# Patient Record
Sex: Male | Born: 1950 | Race: White | Hispanic: No | Marital: Married | Smoking: Former smoker
Health system: Southern US, Community
[De-identification: ages and names within clinical notes are randomized; demographics above are authoritative.]

## PROBLEM LIST (undated history)

## (undated) DIAGNOSIS — T7840XA Allergy, unspecified, initial encounter: Secondary | ICD-10-CM

## (undated) DIAGNOSIS — R51 Headache: Secondary | ICD-10-CM

## (undated) DIAGNOSIS — K219 Gastro-esophageal reflux disease without esophagitis: Secondary | ICD-10-CM

## (undated) DIAGNOSIS — M179 Osteoarthritis of knee, unspecified: Secondary | ICD-10-CM

## (undated) DIAGNOSIS — Z87442 Personal history of urinary calculi: Secondary | ICD-10-CM

## (undated) DIAGNOSIS — I10 Essential (primary) hypertension: Secondary | ICD-10-CM

## (undated) DIAGNOSIS — I2510 Atherosclerotic heart disease of native coronary artery without angina pectoris: Secondary | ICD-10-CM

## (undated) HISTORY — PX: CARDIAC CATHETERIZATION: SHX172

## (undated) HISTORY — DX: Essential (primary) hypertension: I10

## (undated) HISTORY — DX: Headache: R51

## (undated) HISTORY — DX: Gastro-esophageal reflux disease without esophagitis: K21.9

## (undated) HISTORY — PX: HERNIA REPAIR: SHX51

## (undated) HISTORY — DX: Allergy, unspecified, initial encounter: T78.40XA

---

## 1989-02-26 HISTORY — PX: KNEE SURGERY: SHX244

## 1992-10-24 HISTORY — PX: NASAL SINUS SURGERY: SHX719

## 1993-04-25 HISTORY — PX: ROTATOR CUFF REPAIR: SHX139

## 1995-05-20 HISTORY — PX: OTHER SURGICAL HISTORY: SHX169

## 1998-07-07 ENCOUNTER — Ambulatory Visit (HOSPITAL_COMMUNITY): Admission: RE | Admit: 1998-07-07 | Discharge: 1998-07-07 | Payer: Self-pay | Admitting: Urology

## 1998-07-07 ENCOUNTER — Encounter: Payer: Self-pay | Admitting: Urology

## 1999-09-22 ENCOUNTER — Encounter: Payer: Self-pay | Admitting: Emergency Medicine

## 1999-09-22 ENCOUNTER — Emergency Department (HOSPITAL_COMMUNITY): Admission: EM | Admit: 1999-09-22 | Discharge: 1999-09-22 | Payer: Self-pay | Admitting: Emergency Medicine

## 2001-02-05 ENCOUNTER — Encounter: Admission: RE | Admit: 2001-02-05 | Discharge: 2001-02-05 | Payer: Self-pay | Admitting: Family Medicine

## 2001-02-05 ENCOUNTER — Encounter: Payer: Self-pay | Admitting: Family Medicine

## 2004-10-25 ENCOUNTER — Ambulatory Visit: Payer: Self-pay | Admitting: Family Medicine

## 2004-11-06 ENCOUNTER — Ambulatory Visit: Payer: Self-pay | Admitting: Family Medicine

## 2005-12-21 ENCOUNTER — Ambulatory Visit: Payer: Self-pay | Admitting: Family Medicine

## 2005-12-31 ENCOUNTER — Ambulatory Visit: Payer: Self-pay | Admitting: Family Medicine

## 2006-02-05 ENCOUNTER — Ambulatory Visit: Payer: Self-pay | Admitting: Family Medicine

## 2007-01-06 ENCOUNTER — Ambulatory Visit: Payer: Self-pay | Admitting: Family Medicine

## 2007-01-06 LAB — CONVERTED CEMR LAB
ALT: 17 units/L (ref 0–40)
AST: 19 units/L (ref 0–37)
Albumin: 3.5 g/dL (ref 3.5–5.2)
Alkaline Phosphatase: 35 units/L — ABNORMAL LOW (ref 39–117)
BUN: 16 mg/dL (ref 6–23)
Basophils Absolute: 0 10*3/uL (ref 0.0–0.1)
Basophils Relative: 0.4 % (ref 0.0–1.0)
Bilirubin, Direct: 0.1 mg/dL (ref 0.0–0.3)
CO2: 33 meq/L — ABNORMAL HIGH (ref 19–32)
Calcium: 9 mg/dL (ref 8.4–10.5)
Chloride: 105 meq/L (ref 96–112)
Cholesterol: 174 mg/dL (ref 0–200)
Creatinine, Ser: 0.9 mg/dL (ref 0.4–1.5)
Eosinophils Absolute: 0.2 10*3/uL (ref 0.0–0.6)
Eosinophils Relative: 3.4 % (ref 0.0–5.0)
GFR calc Af Amer: 113 mL/min
GFR calc non Af Amer: 93 mL/min
Glucose, Bld: 97 mg/dL (ref 70–99)
HCT: 43 % (ref 39.0–52.0)
HDL: 28.7 mg/dL — ABNORMAL LOW (ref >39.0)
Hemoglobin: 14.6 g/dL (ref 13.0–17.0)
LDL Cholesterol: 111 mg/dL — ABNORMAL HIGH (ref 0–99)
Lymphocytes Relative: 27.5 % (ref 12.0–46.0)
MCHC: 33.9 g/dL (ref 30.0–36.0)
MCV: 92.3 fL (ref 78.0–100.0)
Monocytes Absolute: 0.5 10*3/uL (ref 0.2–0.7)
Monocytes Relative: 8.8 % (ref 3.0–11.0)
Neutro Abs: 3.4 10*3/uL (ref 1.4–7.7)
Neutrophils Relative %: 59.9 % (ref 43.0–77.0)
PSA: 1.69 ng/mL (ref 0.10–4.00)
Platelets: 222 10*3/uL (ref 150–400)
Potassium: 3.8 meq/L (ref 3.5–5.1)
RBC: 4.66 M/uL (ref 4.22–5.81)
RDW: 12 % (ref 11.5–14.6)
Sodium: 144 meq/L (ref 135–145)
TSH: 1.3 microintl units/mL (ref 0.35–5.50)
Total Bilirubin: 0.5 mg/dL (ref 0.3–1.2)
Total CHOL/HDL Ratio: 6.1
Total Protein: 6.5 g/dL (ref 6.0–8.3)
Triglycerides: 170 mg/dL — ABNORMAL HIGH (ref 0–149)
VLDL: 34 mg/dL (ref 0–40)
WBC: 5.6 10*3/uL (ref 4.5–10.5)

## 2007-01-07 LAB — CONVERTED CEMR LAB
ALT: 17 units/L (ref 0–40)
AST: 19 units/L (ref 0–37)
Albumin: 3.5 g/dL (ref 3.5–5.2)
Alkaline Phosphatase: 35 units/L — ABNORMAL LOW (ref 39–117)
BUN: 16 mg/dL (ref 6–23)
Basophils Absolute: 0 10*3/uL (ref 0.0–0.1)
Basophils Relative: 0.4 % (ref 0.0–1.0)
Bilirubin, Direct: 0.1 mg/dL (ref 0.0–0.3)
CO2: 33 meq/L — ABNORMAL HIGH (ref 19–32)
Calcium: 9 mg/dL (ref 8.4–10.5)
Chloride: 105 meq/L (ref 96–112)
Cholesterol: 174 mg/dL (ref 0–200)
Creatinine, Ser: 0.9 mg/dL (ref 0.4–1.5)
Eosinophils Absolute: 0.2 10*3/uL (ref 0.0–0.6)
Eosinophils Relative: 3.4 % (ref 0.0–5.0)
GFR calc Af Amer: 113 mL/min
GFR calc non Af Amer: 93 mL/min
Glucose, Bld: 97 mg/dL (ref 70–99)
HCT: 43 % (ref 39.0–52.0)
HDL: 28.7 mg/dL — ABNORMAL LOW (ref >39.0)
Hemoglobin: 14.6 g/dL (ref 13.0–17.0)
LDL Cholesterol: 111 mg/dL — ABNORMAL HIGH (ref 0–99)
Lymphocytes Relative: 27.5 % (ref 12.0–46.0)
MCHC: 33.9 g/dL (ref 30.0–36.0)
MCV: 92.3 fL (ref 78.0–100.0)
Monocytes Absolute: 0.5 10*3/uL (ref 0.2–0.7)
Monocytes Relative: 8.8 % (ref 3.0–11.0)
Neutro Abs: 3.4 10*3/uL (ref 1.4–7.7)
Neutrophils Relative %: 59.9 % (ref 43.0–77.0)
PSA: 1.69 ng/mL (ref 0.10–4.00)
Platelets: 222 10*3/uL (ref 150–400)
Potassium: 3.8 meq/L (ref 3.5–5.1)
RBC: 4.66 M/uL (ref 4.22–5.81)
RDW: 12 % (ref 11.5–14.6)
Sodium: 144 meq/L (ref 135–145)
TSH: 1.3 microintl units/mL (ref 0.35–5.50)
Total Bilirubin: 0.5 mg/dL (ref 0.3–1.2)
Total CHOL/HDL Ratio: 6.1
Total Protein: 6.5 g/dL (ref 6.0–8.3)
Triglycerides: 170 mg/dL — ABNORMAL HIGH (ref 0–149)
VLDL: 34 mg/dL (ref 0–40)
WBC: 5.6 10*3/uL (ref 4.5–10.5)

## 2007-01-13 ENCOUNTER — Ambulatory Visit: Payer: Self-pay | Admitting: Family Medicine

## 2008-01-06 ENCOUNTER — Ambulatory Visit: Payer: Self-pay | Admitting: Family Medicine

## 2008-01-06 LAB — CONVERTED CEMR LAB
ALT: 15 units/L (ref 0–53)
AST: 18 units/L (ref 0–37)
Albumin: 3.7 g/dL (ref 3.5–5.2)
Alkaline Phosphatase: 35 units/L — ABNORMAL LOW (ref 39–117)
BUN: 18 mg/dL (ref 6–23)
Basophils Absolute: 0 10*3/uL (ref 0.0–0.1)
Basophils Relative: 0.6 % (ref 0.0–1.0)
Bilirubin Urine: NEGATIVE
Bilirubin, Direct: 0.1 mg/dL (ref 0.0–0.3)
Blood in Urine, dipstick: NEGATIVE
CO2: 32 meq/L (ref 19–32)
Calcium: 9.4 mg/dL (ref 8.4–10.5)
Chloride: 107 meq/L (ref 96–112)
Cholesterol: 186 mg/dL (ref 0–200)
Creatinine, Ser: 1 mg/dL (ref 0.4–1.5)
Eosinophils Absolute: 0.2 10*3/uL (ref 0.0–0.6)
Eosinophils Relative: 3.1 % (ref 0.0–5.0)
GFR calc Af Amer: 99 mL/min
GFR calc non Af Amer: 82 mL/min
Glucose, Bld: 108 mg/dL — ABNORMAL HIGH (ref 70–99)
Glucose, Urine, Semiquant: NEGATIVE
HCT: 41.7 % (ref 39.0–52.0)
HDL: 24.9 mg/dL — ABNORMAL LOW (ref >39.0)
Hemoglobin: 14.3 g/dL (ref 13.0–17.0)
Ketones, urine, test strip: NEGATIVE
LDL Cholesterol: 140 mg/dL — ABNORMAL HIGH (ref 0–99)
Lymphocytes Relative: 25.8 % (ref 12.0–46.0)
MCHC: 34.2 g/dL (ref 30.0–36.0)
MCV: 90.8 fL (ref 78.0–100.0)
Monocytes Absolute: 0.5 10*3/uL (ref 0.2–0.7)
Monocytes Relative: 8.9 % (ref 3.0–11.0)
Neutro Abs: 3.8 10*3/uL (ref 1.4–7.7)
Neutrophils Relative %: 61.6 % (ref 43.0–77.0)
Nitrite: NEGATIVE
PSA: 0.84 ng/mL (ref 0.10–4.00)
Platelets: 219 10*3/uL (ref 150–400)
Potassium: 4.4 meq/L (ref 3.5–5.1)
Protein, U semiquant: NEGATIVE
RBC: 4.59 M/uL (ref 4.22–5.81)
RDW: 11.5 % (ref 11.5–14.6)
Sodium: 143 meq/L (ref 135–145)
Specific Gravity, Urine: >=1.03
TSH: 1.16 microintl units/mL (ref 0.35–5.50)
Total Bilirubin: 0.9 mg/dL (ref 0.3–1.2)
Total CHOL/HDL Ratio: 7.5
Total Protein: 6.3 g/dL (ref 6.0–8.3)
Triglycerides: 104 mg/dL (ref 0–149)
Urobilinogen, UA: 0.2
VLDL: 21 mg/dL (ref 0–40)
WBC Urine, dipstick: NEGATIVE
WBC: 6 10*3/uL (ref 4.5–10.5)
pH: 6

## 2008-01-30 ENCOUNTER — Ambulatory Visit: Payer: Self-pay | Admitting: Family Medicine

## 2008-01-30 DIAGNOSIS — J45909 Unspecified asthma, uncomplicated: Secondary | ICD-10-CM | POA: Insufficient documentation

## 2008-01-30 DIAGNOSIS — K219 Gastro-esophageal reflux disease without esophagitis: Secondary | ICD-10-CM

## 2008-01-30 DIAGNOSIS — R519 Headache, unspecified: Secondary | ICD-10-CM

## 2008-01-30 DIAGNOSIS — I10 Essential (primary) hypertension: Secondary | ICD-10-CM

## 2008-01-30 DIAGNOSIS — R51 Headache: Secondary | ICD-10-CM

## 2008-01-30 DIAGNOSIS — J309 Allergic rhinitis, unspecified: Secondary | ICD-10-CM

## 2008-01-30 HISTORY — DX: Gastro-esophageal reflux disease without esophagitis: K21.9

## 2008-01-30 HISTORY — DX: Headache, unspecified: R51.9

## 2008-08-02 ENCOUNTER — Ambulatory Visit: Payer: Self-pay | Admitting: Family Medicine

## 2008-08-02 DIAGNOSIS — K429 Umbilical hernia without obstruction or gangrene: Secondary | ICD-10-CM | POA: Insufficient documentation

## 2008-08-25 ENCOUNTER — Telehealth: Payer: Self-pay | Admitting: Family Medicine

## 2008-08-26 ENCOUNTER — Telehealth: Payer: Self-pay | Admitting: Family Medicine

## 2008-09-29 ENCOUNTER — Encounter: Admission: RE | Admit: 2008-09-29 | Discharge: 2008-09-29 | Payer: Self-pay | Admitting: Surgery

## 2009-03-01 ENCOUNTER — Ambulatory Visit: Payer: Self-pay | Admitting: Family Medicine

## 2009-03-01 LAB — CONVERTED CEMR LAB
Albumin: 3.5 g/dL (ref 3.5–5.2)
Alkaline Phosphatase: 43 units/L (ref 39–117)
Basophils Relative: 0.9 % (ref 0.0–3.0)
Bilirubin Urine: NEGATIVE
Blood in Urine, dipstick: NEGATIVE
CO2: 32 meq/L (ref 19–32)
Chloride: 106 meq/L (ref 96–112)
Direct LDL: 109.3 mg/dL
Eosinophils Absolute: 0.3 10*3/uL (ref 0.0–0.7)
Glucose, Urine, Semiquant: NEGATIVE
HCT: 40.3 % (ref 39.0–52.0)
Hemoglobin: 14.2 g/dL (ref 13.0–17.0)
Lymphocytes Relative: 26.8 % (ref 12.0–46.0)
MCHC: 35.3 g/dL (ref 30.0–36.0)
MCV: 92.4 fL (ref 78.0–100.0)
Monocytes Absolute: 0.5 10*3/uL (ref 0.1–1.0)
Neutro Abs: 3.3 10*3/uL (ref 1.4–7.7)
Nitrite: NEGATIVE
Potassium: 4.1 meq/L (ref 3.5–5.1)
Protein, U semiquant: NEGATIVE
RBC: 4.36 M/uL (ref 4.22–5.81)
Sodium: 142 meq/L (ref 135–145)
Specific Gravity, Urine: 1.02
Total CHOL/HDL Ratio: 7
Total Protein: 6.7 g/dL (ref 6.0–8.3)
Urobilinogen, UA: 0.2
WBC Urine, dipstick: NEGATIVE
pH: 6

## 2009-03-07 ENCOUNTER — Ambulatory Visit: Payer: Self-pay | Admitting: Family Medicine

## 2010-08-22 ENCOUNTER — Ambulatory Visit: Payer: Self-pay | Admitting: Family Medicine

## 2010-08-22 LAB — CONVERTED CEMR LAB
AST: 17 units/L (ref 0–37)
Albumin: 3.4 g/dL — ABNORMAL LOW (ref 3.5–5.2)
Alkaline Phosphatase: 37 units/L — ABNORMAL LOW (ref 39–117)
Basophils Relative: 0.6 % (ref 0.0–3.0)
Bilirubin, Direct: 0 mg/dL (ref 0.0–0.3)
Blood in Urine, dipstick: NEGATIVE
CO2: 31 meq/L (ref 19–32)
Calcium: 9.2 mg/dL (ref 8.4–10.5)
GFR calc non Af Amer: 82.24 mL/min (ref >60)
HDL: 28.3 mg/dL — ABNORMAL LOW (ref >39.00)
Hemoglobin: 14.2 g/dL (ref 13.0–17.0)
LDL Cholesterol: 124 mg/dL — ABNORMAL HIGH (ref 0–99)
Lymphocytes Relative: 22.7 % (ref 12.0–46.0)
Monocytes Relative: 9.7 % (ref 3.0–12.0)
Neutro Abs: 3.7 10*3/uL (ref 1.4–7.7)
Nitrite: NEGATIVE
RBC: 4.46 M/uL (ref 4.22–5.81)
Sodium: 139 meq/L (ref 135–145)
Specific Gravity, Urine: 1.025
Total CHOL/HDL Ratio: 6
Total Protein: 5.9 g/dL — ABNORMAL LOW (ref 6.0–8.3)
VLDL: 26.8 mg/dL (ref 0.0–40.0)
WBC Urine, dipstick: NEGATIVE
WBC: 5.8 10*3/uL (ref 4.5–10.5)

## 2010-08-29 ENCOUNTER — Encounter: Payer: Self-pay | Admitting: Family Medicine

## 2010-08-29 ENCOUNTER — Ambulatory Visit: Payer: Self-pay | Admitting: Family Medicine

## 2010-11-03 ENCOUNTER — Encounter: Payer: Self-pay | Admitting: Family Medicine

## 2010-11-28 NOTE — Assessment & Plan Note (Signed)
Summary: cpx/cjr   Vital Signs:  Patient profile:   60 year old male Height:      61.75 inches Weight:      191 pounds BMI:     35.35 Temp:     98.3 degrees F oral BP sitting:   138 / 98  (left arm) Cuff size:   regular  Vitals Entered By: Kern Reap CMA Duncan Dull) (August 29, 2010 2:03 PM) CC: cpx Is Patient Diabetic? No   CC:  cpx.  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 who comes in today for evaluation of hypertension, reflux esophagitis.  He gets routine eye care, dental care, declines colonoscopy, tetanus, 2007, Bumex, 2008.  Flu shot today.  Review of systems negative except for pain in his left knee for 4 months.  No history of trauma swelling or locking.  BP at home today 130/80  He also complains of some exercise induced asthma.  He hunts  a lot, especially when the weather is cold.  He wheezes.  He's used albuterol in the past with good success.  He would like a refill  Allergies: No Known Drug Allergies  Past History:  Past medical, surgical, family and social histories (including risk factors) reviewed, and no changes noted (except as noted below).  Past Medical History: Reviewed history from 01/30/2008 and no changes required. Allergic rhinitis Asthma GERD Hypertension Headache  Family History: Reviewed history from 01/30/2008 and no changes required. father died 20, COPD, pneumonia, smoker mother died 61, sclera derma renal failure 4 brothers, one died of cardiomyopathy at age 48.  The other has hypertension and MI.  A smoker at age 40.  The other is hypertensive and the other also has high blood pressure.  Four sisters two of hypertension.  One has diabetes.  The other is osteoarthritis.  Social History: Reviewed history from 01/30/2008  and no changes required. Occupation: Married Never Smoked Alcohol use-no Drug use-no Regular exercise-yes  Review of Systems      See HPI       Flu Vaccine Consent Questions     Do you have a history of severe allergic reactions to this vaccine? no    Any prior history of allergic reactions to egg and/or gelatin? no    Do you have a sensitivity to the preservative Thimersol? no    Do you have a past history of Guillan-Barre Syndrome? no    Do you currently have an acute febrile illness? no    Have you ever had a severe reaction to latex? no    Vaccine information given and explained to patient? yes    Are you currently pregnant? no    Lot Number:[id number]   Exp Date:04/28/2011   Site Given  Right  Deltoid IM   Physical Exam  General:  Well-developed,well-nourished,in no acute distress; alert,appropriate and cooperative throughout examination Head:  Normocephalic and atraumatic without obvious abnormalities. No apparent alopecia or balding. Eyes:  No corneal or conjunctival inflammation noted. EOMI. Perrla. Funduscopic exam benign, without hemorrhages, exudates or papilledema. Vision grossly normal. Ears:  External ear exam shows no significant lesions or deformities.  Otoscopic examination reveals clear canals, tympanic membranes are intact bilaterally without bulging, retraction, inflammation or discharge. Hearing is grossly normal bilaterally. Nose:  External nasal examination shows no deformity or inflammation. Nasal mucosa  are pink and moist without lesions or exudates. Mouth:  Oral mucosa and oropharynx without lesions or exudates.  Teeth in good repair. Neck:  No deformities, masses, or tenderness noted. Chest Wall:  No deformities, masses, tenderness or gynecomastia noted. Breasts:  No masses or gynecomastia noted Lungs:  Normal respiratory effort, chest expands symmetrically. Lungs are clear to auscultation, no crackles or wheezes. Heart:  Normal rate and regular rhythm.  S1 and S2 normal without gallop, murmur, click, rub or other extra sounds. Abdomen:  Bowel sounds positive,abdomen soft and non-tender without masses, organomegaly or hernias noted. Rectal:  No external abnormalities noted. Normal sphincter tone. No rectal masses or tenderness. Genitalia:  Testes bilaterally descended without nodularity, tenderness or masses. No scrotal masses or lesions. No penis lesions or urethral discharge. Prostate:  Prostate gland firm and smooth, no enlargement, nodularity, tenderness, mass, asymmetry or induration. Msk:  No deformity or scoliosis noted of thoracic or lumbar spine.   Pulses:  R and L carotid,radial,femoral,dorsalis pedis and posterior tibial pulses are full and equal bilaterally Extremities:  No clubbing, cyanosis, edema, or deformity noted with normal full range of motion of all joints.   Neurologic:  No cranial nerve deficits noted. Station and gait are normal. Plantar reflexes are down-going bilaterally. DTRs are symmetrical throughout. Sensory, motor and coordinative functions appear intact. Skin:  Intact without suspicious lesions or rashes Cervical Nodes:  No lymphadenopathy noted Axillary Nodes:  No palpable lymphadenopathy Inguinal Nodes:  No significant adenopathy Psych:  Cognition and judgment appear intact. Alert and cooperative with normal attention span and concentration. No apparent delusions, illusions, hallucinations   Impression & Recommendations:  Problem # 1:  HYPERTENSION (ICD-401.9) Assessment Improved  His updated medication list for this problem includes:    Lisinopril-hydrochlorothiazide 20-25 Mg Tabs (Lisinopril-hydrochlorothiazide) ..... Qqd  Orders: EKG w/ Interpretation (93000)  Problem # 2:  Preventive Health Care (ICD-V70.0) Assessment: Unchanged  Problem # 3:  GERD (ICD-530.81) Assessment: Improved  His updated medication list for this problem includes:    Cvs Omeprazole 20 Mg Tbec (Omeprazole) ..... Once  daily  Complete Medication List: 1)  Lisinopril-hydrochlorothiazide 20-25 Mg Tabs (Lisinopril-hydrochlorothiazide) .... Qqd 2)  Adult Aspirin Ec Low Strength 81 Mg Tbec (Aspirin) .... Once daily 3)  Cvs Omeprazole 20 Mg Tbec (Omeprazole) .... Once daily 4)  Proventil Hfa 108 (90 Base) Mcg/act Aers (Albuterol sulfate) .[facility].. 1 puff < exercise  Other Orders: Gastroenterology Referral (GI) Admin 1st Vaccine (13244) Flu Vaccine 47yrs + [phone])  Patient Instructions: 1)  you can take Motrin 600 mg twice daily with food elevation and ice for soreness in her left knee. 2)  Please schedule a follow-up appointment in 1 year. 3)  Take an Aspirin every day. 4)  Schedule a colonoscopy/sigmoidoscopy to help detect colon cancer. Prescriptions: PROVENTIL HFA 108 (90 BASE) MCG/ACT AERS (ALBUTEROL SULFATE) 1 puff < exercise  #1 x 1   Entered and Authorized by:   Roderick Pee MD   Signed by:   Roderick Pee MD on 08/29/2010   Method used:   Printed then faxed to ...       [facility]       [address]       [place], [place]  [address]       Ph: [phone]       Fax: [phone]   RxID:   [id number] LISINOPRIL-HYDROCHLOROTHIAZIDE 20-25 MG  TABS (LISINOPRIL-HYDROCHLOROTHIAZIDE) qqd  #100 x  3   Entered and Authorized by:   Roderick Pee MD   Signed by:   Roderick Pee MD on 08/29/2010   Method used:   Electronically to        [facility]ret[facility])       [address]       [place], [place]  [address]       Ph: [phone]       Fax: [phone]   RxID:   [id number]    Orders Added: 1)  Gastroenterology Referral [GI] 2)  Est. Patient 40-64 years [99396] 3)  EKG w/ Interpretation [93000] 4)  Admin 1st Vaccine [90471] 5)  Flu Vaccine 36yrs + [84132]

## 2010-11-30 NOTE — Letter (Signed)
Summary: Referral - not able to see patient  [facility]  [address] [place], [place] [address]   Phone: [phone]  Fax: [phone]    November 03, 2010  Kelle Darting, MD [address] [place], [place] [address]   Re:   Frank [name] DOB:  June 21, 1951 MRN:   [id number]    Dear Dr. Tawanna Cooler:  Thank you for your kind referral of the above patient.  We have attempted to schedule the recommended procedure Screening Colonoscopy but have not been able to schedule because:  ___ The patient was not available by phone and/or has not returned our calls.  X   The patient declined to schedule the procedure at this time.  We appreciate the referral and hope that we will have the opportunity to treat this patient in the future.    Sincerely,    [facility] [phone]

## 2011-05-21 ENCOUNTER — Telehealth: Payer: Self-pay | Admitting: *Deleted

## 2011-05-21 NOTE — Telephone Encounter (Signed)
patient would like to be referred to orthopedist for hip pain.

## 2011-05-22 NOTE — Telephone Encounter (Signed)
Dr. Norlene Campbell,,,,,,,,, he can call and make his own appointment

## 2011-05-22 NOTE — Telephone Encounter (Signed)
patient  Is aware 

## 2011-08-21 ENCOUNTER — Ambulatory Visit (INDEPENDENT_AMBULATORY_CARE_PROVIDER_SITE_OTHER): Payer: BC Managed Care – PPO

## 2011-08-21 DIAGNOSIS — Z23 Encounter for immunization: Secondary | ICD-10-CM

## 2011-10-15 ENCOUNTER — Other Ambulatory Visit (INDEPENDENT_AMBULATORY_CARE_PROVIDER_SITE_OTHER): Payer: BC Managed Care – PPO

## 2011-10-15 DIAGNOSIS — Z0000 Encounter for general adult medical examination without abnormal findings: Secondary | ICD-10-CM

## 2011-10-15 LAB — BASIC METABOLIC PANEL
BUN: 21 mg/dL (ref 6–23)
CO2: 28 mEq/L (ref 19–32)
Calcium: 9.3 mg/dL (ref 8.4–10.5)
GFR: 80.05 mL/min (ref >60.00)
Glucose, Bld: 96 mg/dL (ref 70–99)
Potassium: 4.7 mEq/L (ref 3.5–5.1)

## 2011-10-15 LAB — CBC WITH DIFFERENTIAL/PLATELET
Basophils Absolute: 0 10*3/uL (ref 0.0–0.1)
Eosinophils Absolute: 0.2 10*3/uL (ref 0.0–0.7)
HCT: 43.7 % (ref 39.0–52.0)
Lymphs Abs: 1.6 10*3/uL (ref 0.7–4.0)
MCHC: 34 g/dL (ref 30.0–36.0)
Monocytes Relative: 8.8 % (ref 3.0–12.0)
Neutro Abs: 5.1 10*3/uL (ref 1.4–7.7)
Platelets: 219 10*3/uL (ref 150.0–400.0)
RDW: 13 % (ref 11.5–14.6)

## 2011-10-15 LAB — LIPID PANEL
Cholesterol: 188 mg/dL (ref 0–200)
HDL: 33.3 mg/dL — ABNORMAL LOW (ref >39.00)
LDL Cholesterol: 124 mg/dL — ABNORMAL HIGH (ref 0–99)
Triglycerides: 152 mg/dL — ABNORMAL HIGH (ref 0.0–149.0)
VLDL: 30.4 mg/dL (ref 0.0–40.0)

## 2011-10-15 LAB — POCT URINALYSIS DIPSTICK
Blood, UA: NEGATIVE
Ketones, UA: NEGATIVE
Protein, UA: NEGATIVE
Spec Grav, UA: 1.02
Urobilinogen, UA: 0.2
pH, UA: 6.5

## 2011-10-15 LAB — HEPATIC FUNCTION PANEL
AST: 17 U/L (ref 0–37)
Albumin: 3.7 g/dL (ref 3.5–5.2)
Total Bilirubin: 0.4 mg/dL (ref 0.3–1.2)

## 2011-10-15 LAB — TSH: TSH: 2 u[IU]/mL (ref 0.35–5.50)

## 2011-10-22 ENCOUNTER — Encounter: Payer: Self-pay | Admitting: Family Medicine

## 2011-10-25 ENCOUNTER — Encounter: Payer: Self-pay | Admitting: Family Medicine

## 2011-10-25 ENCOUNTER — Ambulatory Visit (INDEPENDENT_AMBULATORY_CARE_PROVIDER_SITE_OTHER): Payer: BC Managed Care – PPO | Admitting: Family Medicine

## 2011-10-25 VITALS — BP 124/84 | Temp 98.7°F | Ht 62.5 in | Wt 188.0 lb

## 2011-10-25 DIAGNOSIS — R0602 Shortness of breath: Secondary | ICD-10-CM

## 2011-10-25 DIAGNOSIS — I10 Essential (primary) hypertension: Secondary | ICD-10-CM

## 2011-10-25 MED ORDER — LISINOPRIL-HYDROCHLOROTHIAZIDE 20-25 MG PO TABS
1.0000 | ORAL_TABLET | Freq: Every day | ORAL | Status: DC
Start: 2011-10-25 — End: 2013-05-26

## 2011-10-25 NOTE — Patient Instructions (Signed)
Continue your current medications.  We will arrange for a cardiac evaluation with Dr. Freida Busman, Sherlie Ban

## 2011-10-25 NOTE — Progress Notes (Signed)
  Subjective:    Patient ID: Frank [name], male    DOB: 1951-10-03, 60 y.o.   MRN: [id number]  HPI Frank [name] is a 60 year old male, married, nonsmoker, who comes in today for annual physical examination because of a history of hypertension and reflux esophagitis.  He takes lisinopril 20 -- 25 daily.  BP 124/84.  Takes over-the-counter Zantac for some mild reflux esophagitis.  He has noticed for the past year.  Her shortness of breath with exertion.  He denies any chest pain.  He feels, like it's the same now as it was a year ago.  He walks about a hundred paces feel short of breath has to stop and then goes on.  He's also had some hip and back pain with exertion.  However, that again goes away when he stops and then he resumes his normal activities.  He does a lot of running and walking.  One  brother died of coronary disease.  Another brother  had a cardiomyopathy   Review of Systems  Constitutional: Negative.   HENT: Negative.   Eyes: Negative.   Respiratory: Negative.   Cardiovascular: Negative.   Gastrointestinal: Negative.   Genitourinary: Negative.   Musculoskeletal: Negative.   Skin: Negative.   Neurological: Negative.   Hematological: Negative.   Psychiatric/Behavioral: Negative.        Objective:   Physical Exam  Constitutional: He is oriented to person, place, and time. He appears well-developed and well-nourished.  HENT:  Head: Normocephalic and atraumatic.  Right Ear: External ear normal.  Left Ear: External ear normal.  Nose: Nose normal.  Mouth/Throat: Oropharynx is clear and moist.  Eyes: Conjunctivae and EOM are normal. Pupils are equal, round, and reactive to light.  Neck: Normal range of motion. Neck supple. No JVD present. No tracheal deviation present. No thyromegaly present.  Cardiovascular: Normal rate, regular rhythm, normal heart sounds and intact distal pulses.  Exam reveals no gallop and no friction rub.   No murmur heard. Pulmonary/Chest:  Effort normal and breath sounds normal. No stridor. No respiratory distress. He has no wheezes. He has no rales. He exhibits no tenderness.  Abdominal: Soft. Bowel sounds are normal. He exhibits no distension and no mass. There is no tenderness. There is no rebound and no guarding.  Genitourinary: Rectum normal, prostate normal and penis normal. Guaiac negative stool. No penile tenderness.  Musculoskeletal: Normal range of motion. He exhibits no edema and no tenderness.  Lymphadenopathy:    He has no cervical adenopathy.  Neurological: He is alert and oriented to person, place, and time. He has normal reflexes. No cranial nerve deficit. He exhibits normal muscle tone.  Skin: Skin is warm and dry. No rash noted. No erythema. No pallor.  Psychiatric: He has a normal mood and affect. His behavior is normal. Judgment and thought content normal.          Assessment & Plan:  Healthy male.  History of hypertension.  Continue Zestoretic one daily.  History of mild reflux.  Continue over-the-counter Zantac.  New issue of shortness of breath with exertion.  Set up cardiac evaluation.  Osteoarthritis, back and hips.  Motrin, 600 b.i.d.

## 2011-11-27 ENCOUNTER — Ambulatory Visit (INDEPENDENT_AMBULATORY_CARE_PROVIDER_SITE_OTHER): Payer: BC Managed Care – PPO | Admitting: Cardiology

## 2011-11-27 ENCOUNTER — Encounter: Payer: Self-pay | Admitting: Cardiology

## 2011-11-27 DIAGNOSIS — R079 Chest pain, unspecified: Secondary | ICD-10-CM

## 2011-11-27 DIAGNOSIS — R0600 Dyspnea, unspecified: Secondary | ICD-10-CM | POA: Insufficient documentation

## 2011-11-27 DIAGNOSIS — I10 Essential (primary) hypertension: Secondary | ICD-10-CM

## 2011-11-27 DIAGNOSIS — R0602 Shortness of breath: Secondary | ICD-10-CM

## 2011-11-27 DIAGNOSIS — R0989 Other specified symptoms and signs involving the circulatory and respiratory systems: Secondary | ICD-10-CM

## 2011-11-27 NOTE — Progress Notes (Signed)
PCP: Dr. Tawanna Cooler  61 yo with history of HTN presents for evaluation of dyspnea and chest tightness.  For the last 6 months, he has had episodes of exertional dyspnea along with chest tightness and wheezing.  He notes this when he walks in cold weather.  He also has noted this when he is hunting.  The dyspnea/mild chest tightness will occur typically after walking 100-150 yards, especially if he goes up a hill.  He noted it yesterday after carrying a small desk about 30 yards from his house to his workshop.  He felt like he was wheezing.  Symptoms have been stable but were not present more than 6 months ago.  He has no history of asthma but has tried albuterol for the symptoms without much improvement.  He had a brother who had an MI at 41 and another brother with a cardiomyopathy.  He does not smoke.  No orthopnea or PND.   ECG: NSR, normal  Labs (12/12): K 4.7, creatinine 1.0, TSH normal, LDL 124, HDL 33, HCT 43.7  PMH: 1. GERD 2. HTN 3. Exertional dyspnea  SH: Quit smoking in 1972.  Lives in [place].  Married.  Restores furniture.   FH: Brother with MI at 38 and again in his 56s, died at 17.  Brother with cardiomyopathy and valvular heart disease who died at 27.   ROS: All systems reviewed and negative except as per HPI.   Current Outpatient Prescriptions  Medication Sig Dispense Refill  . albuterol (PROVENTIL HFA) 108 (90 BASE) MCG/ACT inhaler Inhale 2 puffs into the lungs every 6 (six) hours as needed.        Marland Kitchen aspirin 81 MG tablet Take 81 mg by mouth daily.        [facility] lisinopril-hydrochlorothiazide (PRINZIDE,ZESTORETIC) 20-25 MG per tablet Take 1 tablet by mouth daily.  100 tablet  3  . ranitidine (ZANTAC) 150 MG tablet Take 150 mg by mouth 2 (two) times daily.          BP 130/68  Pulse 68  Ht 5' 2.5" (1.588 m)  Wt 86.637 kg (191 lb)  BMI 34.38 kg/m2 General: NAD Neck: No JVD, no thyromegaly or thyroid nodule.  Lungs: Clear to auscultation bilaterally with normal respiratory  effort. CV: Nondisplaced PMI.  Heart regular S1/S2, soft S4, no murmur.  No peripheral edema.  No carotid bruit.  Normal pedal pulses.  Abdomen: Soft, nontender, no hepatosplenomegaly, no distention.  Skin: Intact without lesions or rashes.  Neurologic: Alert and oriented x 3.  Psych: Normal affect. Extremities: No clubbing or cyanosis.  HEENT: Normal.

## 2011-11-27 NOTE — Assessment & Plan Note (Signed)
BP appears well-controlled on lisinopril-HCTZ.

## 2011-11-27 NOTE — Assessment & Plan Note (Signed)
Frank [name] has a 6 month history of dyspnea and mild chest tightness with moderate exertion.  It is fairly stable.  Cardiac risk factors include age, HTN, and family history of premature CAD (brother).  He also has a family history of cardiomyopathy.  He is not volume overloaded on exam.  ECG is normal.  Given his symptoms, I would like to risk stratify him for CAD.  I will get an ETT-myoview.  I will also get an echo to make sure that LV systolic function is normal.  He should continue ASA 81.

## 2011-11-27 NOTE — Patient Instructions (Signed)
Your physician has requested that you have an echocardiogram. Echocardiography is a painless test that uses sound waves to create images of your heart. It provides your doctor with information about the size and shape of your heart and how well your heart's chambers and valves are working. This procedure takes approximately one hour. There are no restrictions for this procedure.  Your physician has requested that you have en exercise stress myoview. For further information please visit [email or web address]. Please follow instruction sheet, as given.  Your physician recommends that you schedule a follow-up appointment in: 2 weeks with Dr McLean.      

## 2011-12-04 ENCOUNTER — Ambulatory Visit (HOSPITAL_COMMUNITY): Payer: BC Managed Care – PPO | Attending: Cardiology | Admitting: Radiology

## 2011-12-04 DIAGNOSIS — R0609 Other forms of dyspnea: Secondary | ICD-10-CM

## 2011-12-04 DIAGNOSIS — R072 Precordial pain: Secondary | ICD-10-CM

## 2011-12-04 DIAGNOSIS — R0602 Shortness of breath: Secondary | ICD-10-CM

## 2011-12-04 DIAGNOSIS — R079 Chest pain, unspecified: Secondary | ICD-10-CM | POA: Insufficient documentation

## 2011-12-04 DIAGNOSIS — I10 Essential (primary) hypertension: Secondary | ICD-10-CM | POA: Insufficient documentation

## 2011-12-04 DIAGNOSIS — Z8249 Family history of ischemic heart disease and other diseases of the circulatory system: Secondary | ICD-10-CM | POA: Insufficient documentation

## 2011-12-04 DIAGNOSIS — R0989 Other specified symptoms and signs involving the circulatory and respiratory systems: Secondary | ICD-10-CM | POA: Insufficient documentation

## 2011-12-05 ENCOUNTER — Ambulatory Visit (HOSPITAL_COMMUNITY): Payer: BC Managed Care – PPO | Attending: Cardiovascular Disease | Admitting: Radiology

## 2011-12-05 DIAGNOSIS — R0989 Other specified symptoms and signs involving the circulatory and respiratory systems: Secondary | ICD-10-CM | POA: Insufficient documentation

## 2011-12-05 DIAGNOSIS — R0789 Other chest pain: Secondary | ICD-10-CM | POA: Insufficient documentation

## 2011-12-05 DIAGNOSIS — J45909 Unspecified asthma, uncomplicated: Secondary | ICD-10-CM | POA: Insufficient documentation

## 2011-12-05 DIAGNOSIS — R002 Palpitations: Secondary | ICD-10-CM | POA: Insufficient documentation

## 2011-12-05 DIAGNOSIS — R0609 Other forms of dyspnea: Secondary | ICD-10-CM | POA: Insufficient documentation

## 2011-12-05 DIAGNOSIS — R079 Chest pain, unspecified: Secondary | ICD-10-CM

## 2011-12-05 DIAGNOSIS — R0600 Dyspnea, unspecified: Secondary | ICD-10-CM

## 2011-12-05 DIAGNOSIS — I10 Essential (primary) hypertension: Secondary | ICD-10-CM | POA: Insufficient documentation

## 2011-12-05 DIAGNOSIS — R0602 Shortness of breath: Secondary | ICD-10-CM

## 2011-12-05 DIAGNOSIS — Z87891 Personal history of nicotine dependence: Secondary | ICD-10-CM | POA: Insufficient documentation

## 2011-12-05 DIAGNOSIS — Z8249 Family history of ischemic heart disease and other diseases of the circulatory system: Secondary | ICD-10-CM | POA: Insufficient documentation

## 2011-12-05 MED ORDER — TECHNETIUM TC 99M TETROFOSMIN IV KIT
10.0000 | PACK | Freq: Once | INTRAVENOUS | Status: AC | PRN
  Administered 2011-12-05: 10 via INTRAVENOUS

## 2011-12-05 MED ORDER — TECHNETIUM TC 99M TETROFOSMIN IV KIT
30.0000 | PACK | Freq: Once | INTRAVENOUS | Status: AC | PRN
  Administered 2011-12-05: 30 via INTRAVENOUS

## 2011-12-05 NOTE — Progress Notes (Addendum)
[facility] SITE 3 NUCLEAR MED [address] [place] [address] [phone]  Cardiology Nuclear Med Study  Frank Frank [name] is a 61 y.o. Frank [name] [id number] 02/22/1951   Nuclear Med Background Indication for Stress Test:  Evaluation for Ischemia History:  Asthma and 12/04/11 Echo: EF: 55-60% Cardiac Risk Factors: Family History - CAD, History of Smoking and Hypertension  Symptoms:  Chest Tightness, DOE, Palpitations and SOB   Nuclear Pre-Procedure Caffeine/Decaff Intake:  None NPO After: 10:00pm   Lungs: clear IV 0.9% NS with Angio Cath:  20g  IV Site: R Hand x 1, tolerated well IV Started by:  Irean Hong, RN  Chest Size (in):  44 Cup Size: n/a  Height: 5\' 3"  (1.6 m)  Weight:  188 lb (85.276 kg)  BMI:  Body mass index is 33.30 kg/(m^2). Tech Comments:  n/a    Nuclear Med Study 1 or 2 day study: 1 day  Stress Test Type:  Stress  Reading MD: Charlton Haws, MD  Order Authorizing Provider:  Marca Ancona, MD  Resting Radionuclide: Technetium 54m Tetrofosmin  Resting Radionuclide Dose: 11.0 mCi   Stress Radionuclide:  Technetium 25m Tetrofosmin  Stress Radionuclide Dose: 33.0 mCi           Stress Protocol Rest HR: 62 Stress HR: 141  Rest BP: 121/77 Stress BP: 172/57  Exercise Time (min): 6:45 METS: 8.10   Predicted Max HR: 160 bpm % Max HR: 88.12 bpm Rate Pressure Product: 21308   Dose of Adenosine (mg):  n/a Dose of Lexiscan: n/a mg  Dose of Atropine (mg): n/a Dose of Dobutamine: n/a mcg/kg/min (at max HR)  Stress Test Technologist: Milana Na, EMT-P  Nuclear Technologist:  Domenic Polite, CNMT     Rest Procedure:  Myocardial perfusion imaging was performed at rest 45 minutes following the intravenous administration of Technetium 7m Tetrofosmin. Rest ECG: NSR  Stress Procedure:  The patient exercised for 6:45.  The patient stopped due to fatigue, sob, and denied any chest pain.  There were no significant ST-T wave changes.  Technetium 7m  Tetrofosmin was injected at peak exercise and myocardial perfusion imaging was performed after a brief delay. Stress ECG: No significant change from baseline ECG  QPS Raw Data Images:  Normal; no motion artifact; normal heart/lung ratio. Stress Images:  Normal homogeneous uptake in all areas of the myocardium. Rest Images:  Normal homogeneous uptake in all areas of the myocardium. Subtraction (SDS):  Normal Transient Ischemic Dilatation (Normal <1.22):  0.94 Lung/Heart Ratio (Normal <0.45):  0.24  Quantitative Gated Spect Images QGS EDV:  107 ml QGS ESV:  44 ml QGS cine images:  NL LV Function; NL Wall Motion QGS EF: 59%  Impression Exercise Capacity:  Fair exercise capacity. BP Response:  Normal blood pressure response. Clinical Symptoms:  There is dyspnea. ECG Impression:  No significant ST segment change suggestive of ischemia. Comparison with Prior Nuclear Study: No images to compare  Overall Impression:  Normal stress nuclear study.    [name]    Normal myoview.  Marca Ancona 12/07/2011

## 2011-12-10 ENCOUNTER — Ambulatory Visit (INDEPENDENT_AMBULATORY_CARE_PROVIDER_SITE_OTHER): Payer: BC Managed Care – PPO | Admitting: Cardiology

## 2011-12-10 ENCOUNTER — Encounter: Payer: Self-pay | Admitting: Cardiology

## 2011-12-10 VITALS — BP 130/84 | HR 73 | Ht 63.0 in | Wt 193.0 lb

## 2011-12-10 DIAGNOSIS — R0600 Dyspnea, unspecified: Secondary | ICD-10-CM

## 2011-12-10 DIAGNOSIS — R0609 Other forms of dyspnea: Secondary | ICD-10-CM

## 2011-12-10 DIAGNOSIS — R0989 Other specified symptoms and signs involving the circulatory and respiratory systems: Secondary | ICD-10-CM

## 2011-12-10 NOTE — Progress Notes (Signed)
appt with Dr Shirlee Latch 12/10/11

## 2011-12-10 NOTE — Patient Instructions (Signed)
Your physician recommends that you schedule a follow-up appointment as needed with Dr McLean.  

## 2011-12-11 NOTE — Assessment & Plan Note (Signed)
Patient has dyspnea and mild chest tightness with heavy exertion.  ETT-myoview was reassuring, with no evidence of ischemia and suggesting low risk for cardiac events.  It is possible that the wheezing he gets along with the dyspnea is due to exercise-induced asthma.  Would consider trying a long-acting inhaler for asthma to see if this helps.  There also may be a component of deconditioning: will have him increase his aerobic exercise level.  He will call me if symptoms worsen.

## 2011-12-11 NOTE — Progress Notes (Signed)
PCP: Dr. Tawanna Cooler  61 yo with history of HTN presented for evaluation of dyspnea and chest tightness.  For the last 6 months, he has had episodes of exertional dyspnea along with chest tightness and wheezing.  He notes this when he walks in cold weather.  He also has noted this when he is hunting.  The dyspnea/mild chest tightness will occur typically after walking 100-150 yards, especially if he goes up a hill.  Symptoms have been stable but were not present more than 6 months ago.  He has no history of asthma but has tried albuterol for the symptoms without much improvement.  He had a brother who had an MI at 26 and another brother with a cardiomyopathy.  He does not smoke.  No orthopnea or PND.   I had Mr Barbian do an ETT-myoview.  This showed no ST changes on exercise ECG and no perfusion defects, suggesting no ischemia or infarction.  He had dyspnea on the treadmill.  Echo also was done, showing normal EF with mitral valve prolapse and mild MR.  He continues to have wheezing/dyspnea with heavy exertion.    ECG: NSR, iRBBB  Labs (12/12): K 4.7, creatinine 1.0, TSH normal, LDL 124, HDL 33, HCT 43.7  PMH: 1. GERD 2. HTN 3. Exertional dyspnea: ETT-myoview (2/13) with 6'45" exercise, no ST changes, EF 59%, no ischemia or infarction on perfusion images.  Echo (2/13) with EF 55-60%, MVP with mild mitral regurgitation.   SH: Quit smoking in 1972.  Lives in [place].  Married.  Restores furniture.   FH: Brother with MI at 7 and again in his 51s, died at 79.  Brother with cardiomyopathy and valvular heart disease who died at 49.    Current Outpatient Prescriptions  Medication Sig Dispense Refill  . albuterol (PROVENTIL HFA) 108 (90 BASE) MCG/ACT inhaler Inhale 2 puffs into the lungs every 6 (six) hours as needed.        Marland Kitchen aspirin 81 MG tablet Take 81 mg by mouth daily.        [facility] lisinopril-hydrochlorothiazide (PRINZIDE,ZESTORETIC) 20-25 MG per tablet Take 1 tablet by mouth daily.  100 tablet  3    . ranitidine (ZANTAC) 150 MG tablet Take 150 mg by mouth 2 (two) times daily.          BP 130/84  Pulse 73  Ht 5\' 3"  (1.6 m)  Wt 87.544 kg (193 lb)  BMI 34.19 kg/m2 General: NAD Neck: No JVD, no thyromegaly or thyroid nodule.  Lungs: Clear to auscultation bilaterally with normal respiratory effort. CV: Nondisplaced PMI.  Heart regular S1/S2, soft S4, no murmur.  No peripheral edema.  No carotid bruit.  Normal pedal pulses.  Abdomen: Soft, nontender, no hepatosplenomegaly, no distention.  Skin: Intact without lesions or rashes.  Neurologic: Alert and oriented x 3.  Psych: Normal affect. Extremities: No clubbing or cyanosis.  HEENT: Normal.

## 2012-11-13 ENCOUNTER — Other Ambulatory Visit (INDEPENDENT_AMBULATORY_CARE_PROVIDER_SITE_OTHER): Payer: BC Managed Care – PPO

## 2012-11-13 DIAGNOSIS — Z0000 Encounter for general adult medical examination without abnormal findings: Secondary | ICD-10-CM

## 2012-11-13 LAB — POCT URINALYSIS DIPSTICK
Bilirubin, UA: NEGATIVE
Ketones, UA: NEGATIVE
Leukocytes, UA: NEGATIVE
Spec Grav, UA: 1.02

## 2012-11-13 LAB — CBC WITH DIFFERENTIAL/PLATELET
Eosinophils Absolute: 0.2 10*3/uL (ref 0.0–0.7)
Eosinophils Relative: 3.4 % (ref 0.0–5.0)
Lymphocytes Relative: 33.1 % (ref 12.0–46.0)
MCV: 92 fl (ref 78.0–100.0)
Monocytes Absolute: 0.5 10*3/uL (ref 0.1–1.0)
Neutrophils Relative %: 52.9 % (ref 43.0–77.0)
Platelets: 236 10*3/uL (ref 150.0–400.0)
RBC: 4.64 Mil/uL (ref 4.22–5.81)
WBC: 5.4 10*3/uL (ref 4.5–10.5)

## 2012-11-13 LAB — HEPATIC FUNCTION PANEL
Alkaline Phosphatase: 39 U/L (ref 39–117)
Bilirubin, Direct: 0.1 mg/dL (ref 0.0–0.3)
Total Bilirubin: 0.8 mg/dL (ref 0.3–1.2)

## 2012-11-13 LAB — BASIC METABOLIC PANEL
BUN: 14 mg/dL (ref 6–23)
Calcium: 9.2 mg/dL (ref 8.4–10.5)
Chloride: 102 mEq/L (ref 96–112)
Creatinine, Ser: 1 mg/dL (ref 0.4–1.5)

## 2012-11-13 LAB — LIPID PANEL
Cholesterol: 175 mg/dL (ref 0–200)
LDL Cholesterol: 112 mg/dL — ABNORMAL HIGH (ref 0–99)
Triglycerides: 173 mg/dL — ABNORMAL HIGH (ref 0.0–149.0)

## 2012-11-20 ENCOUNTER — Encounter: Payer: BC Managed Care – PPO | Admitting: Family Medicine

## 2012-11-26 ENCOUNTER — Ambulatory Visit (INDEPENDENT_AMBULATORY_CARE_PROVIDER_SITE_OTHER): Payer: BC Managed Care – PPO | Admitting: Cardiology

## 2012-11-26 ENCOUNTER — Encounter: Payer: Self-pay | Admitting: Cardiology

## 2012-11-26 VITALS — BP 124/74 | HR 64 | Ht 63.0 in | Wt 196.8 lb

## 2012-11-26 DIAGNOSIS — R0609 Other forms of dyspnea: Secondary | ICD-10-CM

## 2012-11-26 DIAGNOSIS — R0989 Other specified symptoms and signs involving the circulatory and respiratory systems: Secondary | ICD-10-CM

## 2012-11-26 DIAGNOSIS — R0600 Dyspnea, unspecified: Secondary | ICD-10-CM

## 2012-11-26 DIAGNOSIS — J45909 Unspecified asthma, uncomplicated: Secondary | ICD-10-CM

## 2012-11-26 MED ORDER — FLUTICASONE-SALMETEROL 100-50 MCG/DOSE IN AEPB: 1.0000 | INHALATION_SPRAY | Freq: Two times a day (BID) | RESPIRATORY_TRACT | Status: DC

## 2012-11-26 NOTE — Progress Notes (Signed)
Patient ID: Frank [name], male   DOB: [birth year], 62 y.o.   MRN: [id number] PCP: Dr. Tawanna Cooler  62 yo with history of HTN presented for cardiac followup.  In 2/13, he had ETT-myoview due to exertional dyspnea and chest tightness.  This was a normal study.  Patient's main complaint now is wheezing with exertion.  He notes wheezing with dyspnea when he walks outside in the cold.  Sometimes he wheezes at night while in bed.  He does not have chest pain.  He used to take inhalers for asthma but does not have anything now.  He is applying for disability because of shoulder problems.    Labs (12/12): K 4.7, creatinine 1.0, TSH normal, LDL 124, HDL 33, HCT 43.7 Labs (1/14): K 4.9, creatinine 1.0, LDL 112, HDL 29  PMH: 1. GERD 2. HTN 3. Exertional dyspnea: ETT-myoview (2/13) with 6'45" exercise, no ST changes, EF 59%, no ischemia or infarction on perfusion images.  Echo (2/13) with EF 55-60%, MVP with mild mitral regurgitation.  4. Asthma  SH: Quit smoking in 1972.  Lives in [place].  Married.  Restores furniture.   FH: Brother with MI at 78 and again in his 30s, died at 29.  Brother with cardiomyopathy and valvular heart disease who died at 76.    Current Outpatient Prescriptions  Medication Sig Dispense Refill  . aspirin 81 MG tablet Take 81 mg by mouth daily.        Marland Kitchen lisinopril-hydrochlorothiazide (PRINZIDE,ZESTORETIC) 20-25 MG per tablet Take 1 tablet by mouth daily.  100 tablet  3  . ranitidine (ZANTAC) 150 MG tablet Take 150 mg by mouth 2 (two) times daily.        . Fluticasone-Salmeterol (ADVAIR DISKUS) 100-50 MCG/DOSE AEPB Inhale 1 puff into the lungs 2 (two) times daily.  60 each  0    BP 124/74  Pulse 64  Ht 5\' 3"  (1.6 m)  Wt 196 lb 12.8 oz (89.268 kg)  BMI 34.86 kg/m2 General: NAD Neck: No JVD, no thyromegaly or thyroid nodule.  Lungs: Clear to auscultation bilaterally with normal respiratory effort. CV: Nondisplaced PMI.  Heart regular S1/S2, soft S4, no murmur.  No  peripheral edema.  No carotid bruit.  Normal pedal pulses.  Abdomen: Soft, nontender, no hepatosplenomegaly, no distention.  Neurologic: Alert and oriented x 3.  Psych: Normal affect. Extremities: No clubbing or cyanosis.   Assessment/Plan: 62 yo with history of exertional wheezing and dyspnea presents for cardiology followup.  He has had a normal stress test.  No chest pain, but he has been wheezing when he walks outside in the cold.  I think that this is likely to be bronchospasm.  I suspect he has asthma (has been on inhalers in the past). I will start him on a low dose of Advair 100/50 bid to see if this helps his symptoms.  He can followup with Dr. Tawanna Cooler.   Marca Ancona 11/26/2012 4:37 PM

## 2012-11-26 NOTE — Patient Instructions (Addendum)
Your physician wants you to follow-up in: 1 year with Dr McLean. (January 2015).  You will receive a reminder letter in the mail two months in advance. If you don't receive a letter, please call our office to schedule the follow-up appointment.  

## 2012-12-25 ENCOUNTER — Encounter: Payer: BC Managed Care – PPO | Admitting: Family Medicine

## 2013-01-13 ENCOUNTER — Encounter: Payer: Self-pay | Admitting: Family

## 2013-01-13 ENCOUNTER — Ambulatory Visit (INDEPENDENT_AMBULATORY_CARE_PROVIDER_SITE_OTHER): Payer: BC Managed Care – PPO | Admitting: Family

## 2013-01-13 VITALS — BP 128/90 | HR 70 | Ht 63.0 in | Wt 197.0 lb

## 2013-01-13 DIAGNOSIS — R7309 Other abnormal glucose: Secondary | ICD-10-CM

## 2013-01-13 DIAGNOSIS — I10 Essential (primary) hypertension: Secondary | ICD-10-CM

## 2013-01-13 DIAGNOSIS — Z0000 Encounter for general adult medical examination without abnormal findings: Secondary | ICD-10-CM

## 2013-01-13 DIAGNOSIS — R739 Hyperglycemia, unspecified: Secondary | ICD-10-CM

## 2013-01-13 DIAGNOSIS — J309 Allergic rhinitis, unspecified: Secondary | ICD-10-CM

## 2013-01-13 NOTE — Patient Instructions (Signed)

## 2013-01-13 NOTE — Progress Notes (Signed)
Subjective:    Patient ID: Frank [name], male    DOB: [birth year], 62 y.o.   MRN: [id number]  HPI  62 year old white male, nonsmoker, patient of Dr. Tawanna Cooler presents for yearly preventative medicine examination. All immunizations and health maintenance protocols were reviewed with the patient and they are up to date with these protocols. Screening laboratory values were reviewed with the patient including screening of hyperlipidemia PSA renal function and hepatic function. There medications past medical history social history problem list and allergies were reviewed in detail. Goals were established with regard to weight loss exercise diet in compliance with medications  Patient continues to complain of dyspnea on walking greater than 50 feet. Reports wheezing. He was given Advair discus that helps, but is extensive. Patient is unable to afford the co-pay on the medication. He often gets samples from our office. Reports his symptoms being worse in extreme hot or cold temperatures. He's concerned that due to the long-term chemical exposure in his work environment he may have damaged his lungs. Is currently seeing cardiology and has a stress test scheduled.  Review of Systems  Constitutional: Negative.   HENT: Negative.   Eyes: Negative.   Respiratory: Negative.   Cardiovascular: Negative.   Gastrointestinal: Negative.   Endocrine: Negative.   Genitourinary: Negative.   Musculoskeletal: Negative.   Allergic/Immunologic: Negative.   Neurological: Negative.   Hematological: Negative.   Psychiatric/Behavioral: Negative.    Past Medical History  Diagnosis Date  . Allergy   . Asthma   . GERD (gastroesophageal reflux disease)   . Hypertension   . Headache     History   Social History  . Marital Status: Married    Spouse Name: N/A    Number of Children: N/A  . Years of Education: N/A   Occupational History  . Not on file.   Social History Main Topics  . Smoking status: Former  Games developer  . Smokeless tobacco: Never Used     Comment: quit in 1972  . Alcohol Use: No  . Drug Use: No  . Sexually Active: Not on file   Other Topics Concern  . Not on file   Social History Narrative  . No narrative on file    Past Surgical History  Procedure Laterality Date  . Rotator cuff repair  04/25/93  . Nasal sinus surgery  10/24/1992  . Hernia repair  1969, 2009  . Knee surgery  02/1989  . Kidney stones  05/20/95    Family History  Problem Relation Age of Onset  . Kidney disease Mother   . COPD Father   . Pneumonia Father   . Hypertension Sister   . Cardiomyopathy Brother   . Heart disease Brother   . Hypertension Brother   . Hypertension Brother   . Hypertension Sister   . Diabetes Sister   . Osteoarthritis Sister     No Known Allergies  Current Outpatient Prescriptions on File Prior to Visit  Medication Sig Dispense Refill  . aspirin 81 MG tablet Take 81 mg by mouth daily.        . Fluticasone-Salmeterol (ADVAIR DISKUS) 100-50 MCG/DOSE AEPB Inhale 1 puff into the lungs 2 (two) times daily.  60 each  0  . lisinopril-hydrochlorothiazide (PRINZIDE,ZESTORETIC) 20-25 MG per tablet Take 1 tablet by mouth daily.  100 tablet  3  . ranitidine (ZANTAC) 150 MG tablet Take 150 mg by mouth 2 (two) times daily.         No current facility-administered medications on  file prior to visit.    BP 128/90  Pulse 70  Ht 5\' 3"  (1.6 m)  Wt 197 lb (89.359 kg)  BMI 34.91 kg/m2  SpO2 97%chart    Objective:   Physical Exam  Constitutional: He is oriented to person, place, and time. He appears well-developed and well-nourished.  HENT:  Head: Normocephalic.  Right Ear: External ear normal.  Left Ear: External ear normal.  Mouth/Throat: Oropharynx is clear and moist.  Eyes: Conjunctivae and EOM are normal. Pupils are equal, round, and reactive to light.  Neck: Normal range of motion. Neck supple.  Cardiovascular: Normal rate, regular rhythm and normal heart sounds.    Pulmonary/Chest: Effort normal and breath sounds normal.  Abdominal: Soft. Bowel sounds are normal. He exhibits no distension. There is no tenderness. There is no rebound and no guarding.  Genitourinary: Rectum normal, prostate normal and penis normal. Guaiac negative stool. No penile tenderness.  Musculoskeletal: Normal range of motion.  Neurological: He is alert and oriented to person, place, and time. He has normal reflexes. He displays normal reflexes. No cranial nerve deficit. Coordination normal.  Skin: Skin is warm and dry.  Psychiatric: He has a normal mood and affect.          Assessment & Plan:  Assessment:  1. Complete physical exam  2. Hyperglycemia 3. Dyspnea 4. Hypertension  Plan: Continue Advair. A1c sent for hyperglycemia. Encouraged healthy diet, exercise, low-cholesterol and low sugar diet. Suggested colonoscopy, with patient declines due to cost. We'll followup pending the results of his labs, in 6 months, and sooner as needed.

## 2013-01-19 ENCOUNTER — Encounter: Payer: BC Managed Care – PPO | Admitting: Family Medicine

## 2013-05-26 ENCOUNTER — Other Ambulatory Visit: Payer: Self-pay | Admitting: Family Medicine

## 2013-12-09 ENCOUNTER — Telehealth: Payer: Self-pay | Admitting: Family Medicine

## 2013-12-09 NOTE — Telephone Encounter (Signed)
Pt request refill of lisinopril-hydrochlorothiazide (PRINZIDE,ZESTORETIC) 20-25 MG per tablet  ( 90 day) [facility]/ [address] Pt has cpe  4/7/115

## 2013-12-10 MED ORDER — LISINOPRIL-HYDROCHLOROTHIAZIDE 20-25 MG PO TABS: ORAL_TABLET | ORAL | Status: DC

## 2013-12-10 NOTE — Telephone Encounter (Signed)
Rx sent 

## 2014-01-26 ENCOUNTER — Other Ambulatory Visit (INDEPENDENT_AMBULATORY_CARE_PROVIDER_SITE_OTHER): Payer: BC Managed Care – PPO

## 2014-01-26 DIAGNOSIS — Z0000 Encounter for general adult medical examination without abnormal findings: Secondary | ICD-10-CM

## 2014-01-26 LAB — CBC WITH DIFFERENTIAL/PLATELET
BASOS PCT: 0.6 % (ref 0.0–3.0)
Basophils Absolute: 0 10*3/uL (ref 0.0–0.1)
EOS ABS: 0.2 10*3/uL (ref 0.0–0.7)
EOS PCT: 3.7 % (ref 0.0–5.0)
HCT: 43.6 % (ref 39.0–52.0)
HEMOGLOBIN: 14.7 g/dL (ref 13.0–17.0)
LYMPHS PCT: 28.3 % (ref 12.0–46.0)
Lymphs Abs: 1.6 10*3/uL (ref 0.7–4.0)
MCHC: 33.7 g/dL (ref 30.0–36.0)
MCV: 93 fl (ref 78.0–100.0)
MONOS PCT: 9.7 % (ref 3.0–12.0)
Monocytes Absolute: 0.6 10*3/uL (ref 0.1–1.0)
NEUTROS ABS: 3.3 10*3/uL (ref 1.4–7.7)
NEUTROS PCT: 57.7 % (ref 43.0–77.0)
Platelets: 217 10*3/uL (ref 150.0–400.0)
RBC: 4.69 Mil/uL (ref 4.22–5.81)
RDW: 13.1 % (ref 11.5–14.6)
WBC: 5.8 10*3/uL (ref 4.5–10.5)

## 2014-01-26 LAB — BASIC METABOLIC PANEL
BUN: 17 mg/dL (ref 6–23)
CALCIUM: 9.4 mg/dL (ref 8.4–10.5)
CO2: 31 meq/L (ref 19–32)
CREATININE: 0.9 mg/dL (ref 0.4–1.5)
Chloride: 104 mEq/L (ref 96–112)
GFR: 89.61 mL/min (ref >60.00)
Glucose, Bld: 101 mg/dL — ABNORMAL HIGH (ref 70–99)
Potassium: 4.8 mEq/L (ref 3.5–5.1)
SODIUM: 140 meq/L (ref 135–145)

## 2014-01-26 LAB — POCT URINALYSIS DIPSTICK
Bilirubin, UA: NEGATIVE
Blood, UA: NEGATIVE
Glucose, UA: NEGATIVE
Ketones, UA: NEGATIVE
LEUKOCYTES UA: NEGATIVE
NITRITE UA: NEGATIVE
PH UA: 5.5
Protein, UA: NEGATIVE
Spec Grav, UA: 1.025
Urobilinogen, UA: 0.2

## 2014-01-26 LAB — LIPID PANEL
Cholesterol: 182 mg/dL (ref 0–200)
HDL: 31.1 mg/dL — ABNORMAL LOW
LDL Cholesterol: 122 mg/dL — ABNORMAL HIGH (ref 0–99)
Total CHOL/HDL Ratio: 6
Triglycerides: 146 mg/dL (ref 0.0–149.0)
VLDL: 29.2 mg/dL (ref 0.0–40.0)

## 2014-01-26 LAB — PSA: PSA: 0.85 ng/mL (ref 0.10–4.00)

## 2014-01-26 LAB — HEPATIC FUNCTION PANEL
ALBUMIN: 3.9 g/dL (ref 3.5–5.2)
ALK PHOS: 41 U/L (ref 39–117)
ALT: 19 U/L (ref 0–53)
AST: 17 U/L (ref 0–37)
BILIRUBIN DIRECT: 0.1 mg/dL (ref 0.0–0.3)
TOTAL PROTEIN: 6.4 g/dL (ref 6.0–8.3)
Total Bilirubin: 0.6 mg/dL (ref 0.3–1.2)

## 2014-01-26 LAB — TSH: TSH: 1.48 u[IU]/mL (ref 0.35–5.50)

## 2014-02-02 ENCOUNTER — Ambulatory Visit (INDEPENDENT_AMBULATORY_CARE_PROVIDER_SITE_OTHER): Payer: BC Managed Care – PPO | Admitting: Family Medicine

## 2014-02-02 ENCOUNTER — Encounter: Payer: Self-pay | Admitting: Family Medicine

## 2014-02-02 VITALS — BP 152/80 | HR 69 | Temp 98.3°F | Ht 63.0 in | Wt 199.0 lb

## 2014-02-02 DIAGNOSIS — R0609 Other forms of dyspnea: Secondary | ICD-10-CM

## 2014-02-02 DIAGNOSIS — R0989 Other specified symptoms and signs involving the circulatory and respiratory systems: Secondary | ICD-10-CM

## 2014-02-02 DIAGNOSIS — I10 Essential (primary) hypertension: Secondary | ICD-10-CM

## 2014-02-02 DIAGNOSIS — K219 Gastro-esophageal reflux disease without esophagitis: Secondary | ICD-10-CM

## 2014-02-02 DIAGNOSIS — J309 Allergic rhinitis, unspecified: Secondary | ICD-10-CM

## 2014-02-02 DIAGNOSIS — J45909 Unspecified asthma, uncomplicated: Secondary | ICD-10-CM

## 2014-02-02 DIAGNOSIS — R0600 Dyspnea, unspecified: Secondary | ICD-10-CM

## 2014-02-02 MED ORDER — FLUTICASONE-SALMETEROL 100-50 MCG/DOSE IN AEPB: 1.0000 | INHALATION_SPRAY | Freq: Two times a day (BID) | RESPIRATORY_TRACT | Status: DC

## 2014-02-02 MED ORDER — LISINOPRIL-HYDROCHLOROTHIAZIDE 20-25 MG PO TABS: ORAL_TABLET | ORAL | Status: DC

## 2014-02-02 NOTE — Progress Notes (Signed)
Pre visit review using our clinic review tool, if applicable. No additional management support is needed unless otherwise documented below in the visit note. 

## 2014-02-02 NOTE — Patient Instructions (Signed)
Lisinopril 20-25....... one tablet daily in the morning for blood pressure control  Advair.......Marland Kitchen. 1 puff twice daily  Zantac 150 mg....... one twice daily  Start the diet and exercise program that I discussed with your wife  Motrin 200 mg twice daily when necessary for aches and pains

## 2014-02-02 NOTE — Progress Notes (Signed)
   Subjective:    Patient ID: Frank [name], male    DOB: 02/20/1951, 63 y.o.   MRN: [id number]  HPI Frank [name] is a 63 year old married male nonsmoker who comes in today for general physical exam because of a history of asthma, hypertension, reflux esophagitis.  He states his blood pressure at home is fairly normal runs in the 135-140 range systolic diastolic in the 70s.  He's supposed to be taking his Advair 1 puff twice a day however he's only taking it 1 puff twice a day when necessary. We made an attempt to explain to him why he should take on a daily basis however he states he's going to take it when he feels like he needs it  He takes Zantac 150 twice a day for history of reflux  He gets routine eye care, dental care, never had a colonoscopy because insurance will pay for it.  Vaccinations up-to-date except he is doing shingles vaccine. He will check with his insurance company on that issue   Review of Systems  Constitutional: Negative.   HENT: Negative.   Eyes: Negative.   Respiratory: Negative.   Cardiovascular: Negative.   Gastrointestinal: Negative.   Genitourinary: Negative.   Musculoskeletal: Negative.   Skin: Negative.   Neurological: Negative.   Psychiatric/Behavioral: Negative.        Objective:   Physical Exam  Nursing note and vitals reviewed. Constitutional: He is oriented to person, place, and time. He appears well-developed and well-nourished.  HENT:  Head: Normocephalic and atraumatic.  Right Ear: External ear normal.  Left Ear: External ear normal.  Nose: Nose normal.  Mouth/Throat: Oropharynx is clear and moist.  Eyes: Conjunctivae and EOM are normal. Pupils are equal, round, and reactive to light.  Neck: Normal range of motion. Neck supple. No JVD present. No tracheal deviation present. No thyromegaly present.  Cardiovascular: Normal rate, regular rhythm, normal heart sounds and intact distal pulses.  Exam reveals no gallop and no friction rub.     No murmur heard. No carotid aortic bruits peripheral pulses 2+ and symmetrical  Pulmonary/Chest: Effort normal and breath sounds normal. No stridor. No respiratory distress. He has no wheezes. He has no rales. He exhibits no tenderness.  Abdominal: Soft. Bowel sounds are normal. He exhibits no distension and no mass. There is no tenderness. There is no rebound and no guarding.  Genitourinary: Rectum normal, prostate normal and penis normal. Guaiac negative stool. No penile tenderness.  Musculoskeletal: Normal range of motion. He exhibits no edema and no tenderness.  Lymphadenopathy:    He has no cervical adenopathy.  Neurological: He is alert and oriented to person, place, and time. He has normal reflexes. No cranial nerve deficit. He exhibits normal muscle tone.  Skin: Skin is warm and dry. No rash noted. No erythema. No pallor.  Total body skin exam normal. He has a garden variety of freckles moles skin tags seborrheic keratosis and capillary hemangiomas all of which appear to be benign  Psychiatric: He has a normal mood and affect. His behavior is normal. Judgment and thought content normal.          Assessment & Plan:  Healthy male  Hypertension at goal continue Zestoretic 20-25 daily  Asthma encouraged to take the Advair 1 puff twice a day  History of reflux continue Zantac 150 twice a day  He's also take complaining occasional leg and joint pain. Advised only take small doses of anti-inflammatories for example Motrin 200 mg twice a day

## 2014-02-09 ENCOUNTER — Telehealth: Payer: Self-pay | Admitting: Family Medicine

## 2014-02-09 NOTE — Telephone Encounter (Signed)
Pt can not afford advair. Pt would like samples of advair 100/50

## 2014-02-09 NOTE — Telephone Encounter (Signed)
Sample is available for pick

## 2014-03-05 ENCOUNTER — Encounter: Payer: Self-pay | Admitting: Family Medicine

## 2014-09-10 ENCOUNTER — Encounter: Payer: Self-pay | Admitting: Family Medicine

## 2014-09-10 ENCOUNTER — Ambulatory Visit (INDEPENDENT_AMBULATORY_CARE_PROVIDER_SITE_OTHER): Payer: BC Managed Care – PPO | Admitting: Family Medicine

## 2014-09-10 VITALS — BP 130/72 | Temp 99.5°F | Wt 199.0 lb

## 2014-09-10 DIAGNOSIS — B029 Zoster without complications: Secondary | ICD-10-CM | POA: Insufficient documentation

## 2014-09-10 MED ORDER — VALACYCLOVIR HCL 1 G PO TABS: 1000.0000 mg | ORAL_TABLET | Freq: Three times a day (TID) | ORAL | Status: DC

## 2014-09-10 NOTE — Patient Instructions (Signed)

## 2014-09-10 NOTE — Assessment & Plan Note (Signed)
L5 or L6 nerve distribuation. Valtrex 1000mg  TID x 7 days as within 72 hours to hopefully hasten resolution of rash/pain. Follow up prn.

## 2014-09-10 NOTE — Progress Notes (Signed)
  Tana ConchStephen Hunter, MD Phone: [phone]  Subjective:   Frank ElseCharles E [name] is a 63 y.o. year old very pleasant male patient who presents with the following:  Rash Thought he had an ingrown hair on Wednesday with 1 small red bump on the top of his foot. Started yesterday on foot expanding.  Pains started shoot up and down leg on the side. He also started feeling like he was having some hip pain. Rash spread to area that hurt below the knee. Tried hydrocortisone cream and itching relieved. Complains of some moderate burning pain. Admits to history of chicken pox as a child. Rash spreading yesterday but stabilized today  ROS-not ill appearing, no fever/chills. No new medications. Not immunocompromised. No mucus membrane involvement.   Past Medical History-hypertension and asthma and GERD  Medications- reviewed and updated Current Outpatient Prescriptions  Medication Sig Dispense Refill  . aspirin 81 MG tablet Take 81 mg by mouth daily.      Marland Kitchen. lisinopril-hydrochlorothiazide (PRINZIDE,ZESTORETIC) 20-25 MG per tablet TAKE ONE TABLET BY MOUTH EVERY DAY 100 tablet 3  . ranitidine (ZANTAC) 150 MG tablet Take 150 mg by mouth 2 (two) times daily.      . Fluticasone-Salmeterol (ADVAIR DISKUS) 100-50 MCG/DOSE AEPB Inhale 1 puff into the lungs 2 (two) times daily. 60 each 6   No current facility-administered medications for this visit.    Objective: BP 130/72 mmHg  Temp(Src) 99.5 F (37.5 C)  Wt 199 lb (90.266 kg) Gen: NAD, resting comfortably No lip or tongue swelling. No respiratory distress. No mucus membrane involvement.  Ext: no edema Skin: Multiple vesicles on erythematous base in L5 distribution. No rash in any other area. No rash above the knee but some sensitivity to touching skin.  Neuro: grossly normal, moves all extremities, sensitivity noted along L5 or L6 nerve distribution       Assessment/Plan:  Shingles L5 or L6 nerve distribuation. Valtrex 1000mg  TID x 7 days as within 72  hours to hopefully hasten resolution of rash/pain. Follow up prn.    Return precautions advised.   Meds ordered this encounter  Medications  . valACYclovir (VALTREX) 1000 MG tablet    Sig: Take 1 tablet (1,000 mg total) by mouth 3 (three) times daily.    Dispense:  21 tablet    Refill:  0

## 2015-08-06 ENCOUNTER — Encounter: Payer: Self-pay | Admitting: Family Medicine

## 2015-08-06 ENCOUNTER — Ambulatory Visit (INDEPENDENT_AMBULATORY_CARE_PROVIDER_SITE_OTHER): Payer: BLUE CROSS/BLUE SHIELD | Admitting: Family Medicine

## 2015-08-06 VITALS — BP 144/100 | HR 79 | Temp 98.1°F | Ht 63.0 in | Wt 194.8 lb

## 2015-08-06 DIAGNOSIS — N481 Balanitis: Secondary | ICD-10-CM | POA: Diagnosis not present

## 2015-08-06 MED ORDER — FLUCONAZOLE 150 MG PO TABS: ORAL_TABLET | ORAL | Status: DC

## 2015-08-06 MED ORDER — KETOCONAZOLE 2 % EX CREA: 1.0000 "application " | TOPICAL_CREAM | Freq: Every day | CUTANEOUS | Status: DC

## 2015-08-06 NOTE — Progress Notes (Signed)
Pre visit review using our clinic review tool, if applicable. No additional management support is needed unless otherwise documented below in the visit note. 

## 2015-08-06 NOTE — Patient Instructions (Signed)
I think you have a yeast skin infection  Keep area clean and dry  Take the diflucan as directed  Use the cream daily until better   Update if not starting to improve in a week or if worsening

## 2015-08-06 NOTE — Progress Notes (Signed)
Subjective:    Patient ID: Frank [name], male    DOB: 11-30-1950, 64 y.o.   MRN: [id number]  HPI Here for a rash on penis   Noticed it 3 days ago  Looks raw but no bumps  Does not itch or hurt - just feels raw when washing it   Similar event a month ago on one side  Lasted about a week   No sexual activity recently   Used monistat 7 the first episode and it helped -but not this time   No chance exp to STDs -no new partners  Not sexually active  Last was 6 months  Wife - married 37 years   Never had any stds   No fever or other symptoms   No pain to urinate at all   Patient Active Problem List   Diagnosis Date Noted  . Shingles 09/10/2014  . Dyspnea 11/27/2011  . HERNIA, UMBILICAL 08/02/2008  . HYPERTENSION 01/30/2008  . ALLERGIC RHINITIS 01/30/2008  . ASTHMA 01/30/2008  . GERD 01/30/2008  . HEADACHE 01/30/2008   Past Medical History  Diagnosis Date  . Allergy   . Asthma   . GERD (gastroesophageal reflux disease)   . Hypertension   . [id number](478.2)    Past Surgical History  Procedure Laterality Date  . Rotator cuff repair  04/25/93  . Nasal sinus surgery  10/24/1992  . Hernia repair  1969, 2009  . Knee surgery  02/1989  . Kidney stones  05/20/95   Social History  Substance Use Topics  . Smoking status: Former Games developer  . Smokeless tobacco: Never Used     Comment: quit in 1972  . Alcohol Use: No   Family History  Problem Relation Age of Onset  . Kidney disease Mother   . COPD Father   . Pneumonia Father   . Hypertension Sister   . Cardiomyopathy Brother   . Heart disease Brother   . Hypertension Brother   . Hypertension Brother   . Hypertension Sister   . Diabetes Sister   . Osteoarthritis Sister    No Known Allergies Current Outpatient Prescriptions on File Prior to Visit  Medication Sig Dispense Refill  . aspirin 81 MG tablet Take 81 mg by mouth daily.      . Fluticasone-Salmeterol (ADVAIR DISKUS) 100-50 MCG/DOSE AEPB Inhale 1 puff  into the lungs 2 (two) times daily. 60 each 6  . lisinopril-hydrochlorothiazide (PRINZIDE,ZESTORETIC) 20-25 MG per tablet TAKE ONE TABLET BY MOUTH EVERY DAY (Patient taking differently: Take 0.5 tablets by mouth daily. TAKE ONE TABLET BY MOUTH EVERY DAY) 100 tablet 3  . ranitidine (ZANTAC) 150 MG tablet Take 150 mg by mouth 2 (two) times daily.       No current facility-administered medications on file prior to visit.    Review of Systems    Review of Systems  Constitutional: Negative for fever, appetite change, fatigue and unexpected weight change.  Eyes: Negative for pain and visual disturbance.  Respiratory: Negative for cough and shortness of breath.   Cardiovascular: Negative for cp or palpitations    Gastrointestinal: Negative for nausea, diarrhea and constipation.  Genitourinary: Negative for urgency and frequency. neg for dysuria or hematuria or penis discharge  Skin: Negative for pallor and pos for rash  Neurological: Negative for weakness, light-headedness, numbness and headaches.  Hematological: Negative for adenopathy. Does not bruise/bleed easily.  Psychiatric/Behavioral: Negative for dysphoric mood. The patient is not nervous/anxious.      Objective:   Physical Exam  Constitutional: He appears well-developed and well-nourished. No distress.  Eyes: Conjunctivae and EOM are normal. Pupils are equal, round, and reactive to light. Right eye exhibits no discharge. Left eye exhibits no discharge. No scleral icterus.  Neck: Normal range of motion. Neck supple.  Cardiovascular: Normal rate and regular rhythm.   Pulmonary/Chest: Effort normal and breath sounds normal.  Abdominal: Soft. Bowel sounds are normal.  No suprapubic tenderness or fullness    Genitourinary:  See skin exam  Musculoskeletal: He exhibits no edema or tenderness.  Lymphadenopathy:    He has no cervical adenopathy.       Right: No inguinal adenopathy present.       Left: No inguinal adenopathy present.    Neurological: He is alert.  Skin: Skin is warm and dry. There is erythema. No pallor.  Several areas surrouding corona of penis that are erythtematous with satellite lesions (no skin breakdown)  Consistent with candidal balanitis   No odor  No vesicles   Psychiatric: He has a normal mood and affect.          Assessment & Plan:   Problem List Items Addressed This Visit      Genitourinary   Balanitis - Primary    Looks to be yeast on exam (pt states he has been working outdoors and sweating) Partial help with monistat  Disc imp of gentle cleansing and thorough drying  Diflucan 150 mg today and repeat dose in 3 d Ketoconazole cream daily until improved  Update if not starting to improve in a week or if worsening

## 2015-08-06 NOTE — Assessment & Plan Note (Signed)
Looks to be yeast on exam (pt states he has been working outdoors and sweating) Partial help with monistat  Disc imp of gentle cleansing and thorough drying  Diflucan 150 mg today and repeat dose in 3 d Ketoconazole cream daily until improved  Update if not starting to improve in a week or if worsening

## 2015-08-10 ENCOUNTER — Other Ambulatory Visit (INDEPENDENT_AMBULATORY_CARE_PROVIDER_SITE_OTHER): Payer: BLUE CROSS/BLUE SHIELD

## 2015-08-10 DIAGNOSIS — Z0000 Encounter for general adult medical examination without abnormal findings: Secondary | ICD-10-CM | POA: Diagnosis not present

## 2015-08-10 DIAGNOSIS — R7989 Other specified abnormal findings of blood chemistry: Secondary | ICD-10-CM | POA: Diagnosis not present

## 2015-08-10 LAB — CBC WITH DIFFERENTIAL/PLATELET
BASOS PCT: 0.6 % (ref 0.0–3.0)
Basophils Absolute: 0 10*3/uL (ref 0.0–0.1)
EOS PCT: 3.3 % (ref 0.0–5.0)
Eosinophils Absolute: 0.2 10*3/uL (ref 0.0–0.7)
HEMATOCRIT: 44.4 % (ref 39.0–52.0)
HEMOGLOBIN: 14.9 g/dL (ref 13.0–17.0)
LYMPHS PCT: 22.3 % (ref 12.0–46.0)
Lymphs Abs: 1.7 10*3/uL (ref 0.7–4.0)
MCHC: 33.5 g/dL (ref 30.0–36.0)
MCV: 92.7 fl (ref 78.0–100.0)
MONO ABS: 0.8 10*3/uL (ref 0.1–1.0)
Monocytes Relative: 10.4 % (ref 3.0–12.0)
Neutro Abs: 4.7 10*3/uL (ref 1.4–7.7)
Neutrophils Relative %: 63.4 % (ref 43.0–77.0)
Platelets: 238 10*3/uL (ref 150.0–400.0)
RBC: 4.8 Mil/uL (ref 4.22–5.81)
RDW: 13.1 % (ref 11.5–15.5)
WBC: 7.4 10*3/uL (ref 4.0–10.5)

## 2015-08-10 LAB — BASIC METABOLIC PANEL
BUN: 18 mg/dL (ref 6–23)
CHLORIDE: 103 meq/L (ref 96–112)
CO2: 30 mEq/L (ref 19–32)
Calcium: 9.5 mg/dL (ref 8.4–10.5)
Creatinine, Ser: 0.94 mg/dL (ref 0.40–1.50)
GFR: 85.89 mL/min (ref >60.00)
Glucose, Bld: 117 mg/dL — ABNORMAL HIGH (ref 70–99)
POTASSIUM: 4.6 meq/L (ref 3.5–5.1)
SODIUM: 140 meq/L (ref 135–145)

## 2015-08-10 LAB — HEPATIC FUNCTION PANEL
ALT: 13 U/L (ref 0–53)
AST: 16 U/L (ref 0–37)
Albumin: 3.9 g/dL (ref 3.5–5.2)
Alkaline Phosphatase: 46 U/L (ref 39–117)
BILIRUBIN DIRECT: 0.1 mg/dL (ref 0.0–0.3)
BILIRUBIN TOTAL: 0.3 mg/dL (ref 0.2–1.2)
TOTAL PROTEIN: 6.4 g/dL (ref 6.0–8.3)

## 2015-08-10 LAB — POCT URINALYSIS DIPSTICK
Bilirubin, UA: NEGATIVE
Blood, UA: NEGATIVE
Glucose, UA: NEGATIVE
Ketones, UA: NEGATIVE
LEUKOCYTES UA: NEGATIVE
NITRITE UA: NEGATIVE
PH UA: 5.5
PROTEIN UA: NEGATIVE
Spec Grav, UA: 1.025
UROBILINOGEN UA: 0.2

## 2015-08-10 LAB — LIPID PANEL
CHOLESTEROL: 187 mg/dL (ref 0–200)
HDL: 31.8 mg/dL — AB (ref >39.00)
NonHDL: 155.27
TRIGLYCERIDES: 247 mg/dL — AB (ref 0.0–149.0)
Total CHOL/HDL Ratio: 6
VLDL: 49.4 mg/dL — AB (ref 0.0–40.0)

## 2015-08-10 LAB — LDL CHOLESTEROL, DIRECT: Direct LDL: 118 mg/dL

## 2015-08-10 LAB — TSH: TSH: 1.51 u[IU]/mL (ref 0.35–4.50)

## 2015-08-10 LAB — PSA: PSA: 0.76 ng/mL (ref 0.10–4.00)

## 2015-08-17 ENCOUNTER — Encounter: Payer: Self-pay | Admitting: Family Medicine

## 2015-08-17 ENCOUNTER — Ambulatory Visit (INDEPENDENT_AMBULATORY_CARE_PROVIDER_SITE_OTHER): Payer: BLUE CROSS/BLUE SHIELD | Admitting: Family Medicine

## 2015-08-17 VITALS — BP 140/90 | Temp 98.7°F | Ht 62.5 in | Wt 194.0 lb

## 2015-08-17 DIAGNOSIS — Z0000 Encounter for general adult medical examination without abnormal findings: Secondary | ICD-10-CM

## 2015-08-17 DIAGNOSIS — I10 Essential (primary) hypertension: Secondary | ICD-10-CM

## 2015-08-17 DIAGNOSIS — R739 Hyperglycemia, unspecified: Secondary | ICD-10-CM

## 2015-08-17 DIAGNOSIS — E669 Obesity, unspecified: Secondary | ICD-10-CM

## 2015-08-17 DIAGNOSIS — R7309 Other abnormal glucose: Secondary | ICD-10-CM

## 2015-08-17 DIAGNOSIS — J4520 Mild intermittent asthma, uncomplicated: Secondary | ICD-10-CM

## 2015-08-17 DIAGNOSIS — Z23 Encounter for immunization: Secondary | ICD-10-CM

## 2015-08-17 DIAGNOSIS — J3089 Other allergic rhinitis: Secondary | ICD-10-CM

## 2015-08-17 HISTORY — DX: Obesity, unspecified: E66.9

## 2015-08-17 MED ORDER — FLUTICASONE-SALMETEROL 100-50 MCG/DOSE IN AEPB: 1.0000 | INHALATION_SPRAY | Freq: Two times a day (BID) | RESPIRATORY_TRACT | Status: DC

## 2015-08-17 MED ORDER — LISINOPRIL-HYDROCHLOROTHIAZIDE 20-25 MG PO TABS: ORAL_TABLET | ORAL | Status: DC

## 2015-08-17 NOTE — Patient Instructions (Signed)
Start a diet and exercise program.......... No carbohydrates walk 30 minutes daily  Follow-up labs in 4 months fasting  Return 1 week after lab work for follow-up with me  Zestoretic 20-25............. One full tablet daily. Check your blood pressure Monday Wednesday Friday  Return return in 4 months bring a record of all your blood pressure readings

## 2015-08-17 NOTE — Progress Notes (Signed)
Pre visit review using our clinic review tool, if applicable. No additional management support is needed unless otherwise documented below in the visit note. 

## 2015-08-17 NOTE — Progress Notes (Signed)
   Subjective:    Patient ID: Frank [name], male    DOB: 01/15/1951, 64 y.o.   MRN: [id number]  HPI Frank [name] is a 64 year old married male nonsmoker retired...Marland Kitchen.Marland Kitchen.Marland Kitchen. Dabbles in antiques...Marland Kitchen.Marland Kitchen.Marland Kitchen. Who comes in today for general physical examination because of a history of hypertension and allergic rhinitis and chronic reflux esophagitis without any major complications He takes lisinopril 1025 one half tab daily BP 140/90. We'll increase that to 1 full tablet daily  He gets routine eye care, dental care, never had a colonoscopy because insurance will pay for. I will send him to GI for further consultation. No family history of colon cancer polyps no change in bowel habits bleeding etc.  Vaccinations he was given a flu shot and a tetanus booster today.  His weight is 195 pounds. At classifies him as obesity 1. BMI 34.6. Fasting blood sugar 117. His sisters is diabetic. He's very active he does not walk on a daily basis.   Review of Systems  Constitutional: Negative.   HENT: Negative.   Eyes: Negative.   Respiratory: Negative.   Cardiovascular: Negative.   Gastrointestinal: Negative.   Endocrine: Negative.   Genitourinary: Negative.   Musculoskeletal: Negative.   Skin: Negative.   Allergic/Immunologic: Negative.   Neurological: Negative.   Hematological: Negative.   Psychiatric/Behavioral: Negative.        Objective:   Physical Exam  Constitutional: He is oriented to person, place, and time. He appears well-developed and well-nourished.  HENT:  Head: Normocephalic and atraumatic.  Right Ear: External ear normal.  Left Ear: External ear normal.  Nose: Nose normal.  Mouth/Throat: Oropharynx is clear and moist.  Eyes: Conjunctivae and EOM are normal. Pupils are equal, round, and reactive to light.  Neck: Normal range of motion. Neck supple. No JVD present. No tracheal deviation present. No thyromegaly present.  Cardiovascular: Normal rate, regular rhythm, normal heart sounds and intact  distal pulses.  Exam reveals no gallop and no friction rub.   No murmur heard. Pulmonary/Chest: Effort normal and breath sounds normal. No stridor. No respiratory distress. He has no wheezes. He has no rales. He exhibits no tenderness.  Abdominal: Soft. Bowel sounds are normal. He exhibits no distension and no mass. There is no tenderness. There is no rebound and no guarding.  Genitourinary: Rectum normal, prostate normal and penis normal. Guaiac negative stool. No penile tenderness.  Musculoskeletal: Normal range of motion. He exhibits no edema or tenderness.  Lymphadenopathy:    He has no cervical adenopathy.  Neurological: He is alert and oriented to person, place, and time. He has normal reflexes. No cranial nerve deficit. He exhibits normal muscle tone.  Skin: Skin is warm and dry. No rash noted. No erythema. No pallor.  Psychiatric: He has a normal mood and affect. His behavior is normal. Judgment and thought content normal.  Nursing note and vitals reviewed.         Assessment & Plan:  Healthy male  Hypertension not at goal........ Increase lisinopril from 10 mg a day to 20  Obesity with elevated blood sugar........Marland Kitchen. Discussed diet exercise........ Follow-up fasting blood sugar and A1c in 3 months  Reflux esophagitis OTC Pepcid when necessary

## 2015-09-28 ENCOUNTER — Ambulatory Visit (INDEPENDENT_AMBULATORY_CARE_PROVIDER_SITE_OTHER): Payer: BLUE CROSS/BLUE SHIELD | Admitting: Family Medicine

## 2015-09-28 VITALS — BP 130/80 | Temp 99.5°F | Wt 189.0 lb

## 2015-09-28 DIAGNOSIS — N410 Acute prostatitis: Secondary | ICD-10-CM | POA: Diagnosis not present

## 2015-09-28 DIAGNOSIS — R300 Dysuria: Secondary | ICD-10-CM

## 2015-09-28 LAB — POCT URINALYSIS DIPSTICK
BILIRUBIN UA: NEGATIVE
Glucose, UA: NEGATIVE
KETONES UA: NEGATIVE
Nitrite, UA: POSITIVE
PH UA: 6
PROTEIN UA: 100
SPEC GRAV UA: 1.015
Urobilinogen, UA: 0.2

## 2015-09-28 MED ORDER — DOXYCYCLINE HYCLATE 100 MG PO TABS: 100.0000 mg | ORAL_TABLET | Freq: Two times a day (BID) | ORAL | Status: DC

## 2015-09-28 NOTE — Progress Notes (Signed)
   Subjective:    Patient ID: Frank [name], male    DOB: 04/23/1951, 64 y.o.   MRN: [id number]  HPI Frank [name] is a 64 year old married male nonsmoker who comes in today for evaluation of prostatitis  He states that Saturday he noticed some burning on urination and drink lots water his symptoms seemed diminished never went away. Yesterday he started burning again. No fever chills or back pain.  He's never had a urinary tract infection in the past   Review of Systems    review of systems otherwise negative Objective:   Physical Exam Well-developed well-nourished male no acute distress vital signs stable he is afebrile  Urinalysis shows large amount of blood positive nitrate moderate white cells       Assessment & Plan:  Acute prostatitis............. doxycycline twice a day for 3 weeks.

## 2015-09-28 NOTE — Patient Instructions (Signed)
Drink lots of water  Doxycycline.......... one twice daily for 3 weeks............... if at the end of 3 weeks her symptoms are not 100% gone.........Marland Kitchen. refill the doxycycline and take another 3 weeks  Return when necessary

## 2015-09-28 NOTE — Progress Notes (Signed)
Pre visit review using our clinic review tool, if applicable. No additional management support is needed unless otherwise documented below in the visit note. 

## 2015-12-05 ENCOUNTER — Other Ambulatory Visit (INDEPENDENT_AMBULATORY_CARE_PROVIDER_SITE_OTHER): Payer: BLUE CROSS/BLUE SHIELD

## 2015-12-05 DIAGNOSIS — E669 Obesity, unspecified: Secondary | ICD-10-CM

## 2015-12-05 DIAGNOSIS — R7309 Other abnormal glucose: Secondary | ICD-10-CM | POA: Diagnosis not present

## 2015-12-05 DIAGNOSIS — R739 Hyperglycemia, unspecified: Secondary | ICD-10-CM

## 2015-12-05 LAB — HEMOGLOBIN A1C: HEMOGLOBIN A1C: 5.9 % (ref 4.6–6.5)

## 2015-12-05 LAB — BASIC METABOLIC PANEL
BUN: 20 mg/dL (ref 6–23)
CHLORIDE: 102 meq/L (ref 96–112)
CO2: 30 mEq/L (ref 19–32)
CREATININE: 0.89 mg/dL (ref 0.40–1.50)
Calcium: 9.3 mg/dL (ref 8.4–10.5)
GFR: 91.39 mL/min (ref >60.00)
Glucose, Bld: 99 mg/dL (ref 70–99)
POTASSIUM: 4.3 meq/L (ref 3.5–5.1)
SODIUM: 139 meq/L (ref 135–145)

## 2015-12-12 ENCOUNTER — Encounter: Payer: Self-pay | Admitting: Family Medicine

## 2015-12-12 ENCOUNTER — Ambulatory Visit (INDEPENDENT_AMBULATORY_CARE_PROVIDER_SITE_OTHER): Payer: BLUE CROSS/BLUE SHIELD | Admitting: Family Medicine

## 2015-12-12 VITALS — BP 130/90 | Temp 98.8°F | Wt 190.0 lb

## 2015-12-12 DIAGNOSIS — R739 Hyperglycemia, unspecified: Secondary | ICD-10-CM

## 2015-12-12 DIAGNOSIS — R7309 Other abnormal glucose: Secondary | ICD-10-CM

## 2015-12-12 NOTE — Patient Instructions (Signed)
Continue diet program  Follow-up office visit in 6 months........ establish with Kandee Keen or Raynelle Fanning or Dr. [place]........ to get established for long-term care  Check your blood sugar once weekly

## 2015-12-12 NOTE — Progress Notes (Signed)
   Subjective:    Patient ID: Frank [name], male    DOB: [birth year], 65 y.o.   MRN: [id number]  HPI Frank [name] is a 65 year old married male nonsmoker who comes in today for follow-up of elevated blood sugar  We saw him in the fall for his physical exam blood sugar fasting was 117. He started a diet program he has not lost any weight. Blood sugar now 99 with an A1c of 5.9%.   Review of Systems Review of systems otherwise negative    Objective:   Physical Exam  Well-developed well-nourished male no acute distress vital signs stable he is afebrile....... BP at home 120/70      Assessment & Plan:  Glucose intolerance.......... continue diet follow-up blood sugar in 6 months and A1c

## 2015-12-12 NOTE — Progress Notes (Signed)
Pre visit review using our clinic review tool, if applicable. No additional management support is needed unless otherwise documented below in the visit note. 

## 2016-10-09 ENCOUNTER — Other Ambulatory Visit: Payer: Self-pay | Admitting: Family Medicine

## 2016-12-25 ENCOUNTER — Encounter: Payer: Self-pay | Admitting: Family Medicine

## 2016-12-25 ENCOUNTER — Ambulatory Visit (INDEPENDENT_AMBULATORY_CARE_PROVIDER_SITE_OTHER): Payer: PPO | Admitting: Family Medicine

## 2016-12-25 VITALS — BP 122/70 | HR 80 | Temp 98.7°F | Ht 62.5 in | Wt 195.6 lb

## 2016-12-25 DIAGNOSIS — M5431 Sciatica, right side: Secondary | ICD-10-CM

## 2016-12-25 DIAGNOSIS — M79604 Pain in right leg: Secondary | ICD-10-CM

## 2016-12-25 MED ORDER — PREDNISONE 20 MG PO TABS: ORAL_TABLET | ORAL | 0 refills | Status: DC

## 2016-12-25 NOTE — Patient Instructions (Signed)
Prednisone 7 day trial  If not at least 50% better in 10 days- can either see me back or we can refer you to Dr. Berline Choughigby of sports medicine  Also let me know if worsening symptoms, leg weakness, numbness/tingling into legs

## 2016-12-25 NOTE — Progress Notes (Signed)
Pre visit review using our clinic review tool, if applicable. No additional management support is needed unless otherwise documented below in the visit note. 

## 2016-12-25 NOTE — Progress Notes (Signed)
Subjective:  Frank [name] is a 66 y.o. year old very pleasant male patient who presents for/with See problem oriented charting ROS- No saddle anesthesia, bladder incontinence, fecal incontinence, weakness in extremity, numbness or tingling in extremity.   Past Medical History-  Patient Active Problem List   Diagnosis Date Noted  . Acute prostatitis with hematuria 09/28/2015  . Obese 08/17/2015  . Elevated blood sugar 08/17/2015  . Balanitis 08/06/2015  . Shingles 09/10/2014  . Dyspnea 11/27/2011  . HERNIA, UMBILICAL 08/02/2008  . Essential hypertension 01/30/2008  . Allergic rhinitis 01/30/2008  . GERD 01/30/2008  . HEADACHE 01/30/2008    Medications- reviewed and updated Current Outpatient Prescriptions  Medication Sig Dispense Refill  . aspirin 81 MG tablet Take 81 mg by mouth daily.      . Fluticasone-Salmeterol (ADVAIR DISKUS) 100-50 MCG/DOSE AEPB Inhale 1 puff into the lungs 2 (two) times daily. 60 each 6  . lisinopril-hydrochlorothiazide (PRINZIDE,ZESTORETIC) 20-25 MG tablet TAKE 1 TABLET BY MOUTH EVERY DAY 100 tablet 2  . ranitidine (ZANTAC) 150 MG tablet Take 150 mg by mouth 2 (two) times daily.       No current facility-administered medications for this visit.     Objective: BP 122/70 (BP Location: Left Arm, Patient Position: Sitting, Cuff Size: Large)   Pulse 80   Temp 98.7 F (37.1 C) (Oral)   Ht 5' 2.5" (1.588 m)   Wt 195 lb 9.6 oz (88.7 kg)   SpO2 97%   BMI 35.21 kg/m  Gen: NAD, resting comfortably, moves slowly to table but then got down easily CV: RRR no murmurs rubs or gallops Lungs: CTAB no crackles, wheeze, rhonchi Ext: no edema Skin: warm, dry, no rash on back or legs Neuro- no saddle anesthesia, 5/5 strength lower extremities as noted below, 2+ reflexes  Right and left Hip: Largely normal ROM though some reduced flexibility- no pain with ROM Strength IR: 5/5, ER: 5/5, Flexion: 5/5, Extension: 5/5, Abduction: 5/5, Adduction: 5/5 Pelvic  alignment unremarkable to inspection and palpation. Some pain with FABER down into thigh.    Back - Normal skin, Spine with normal alignment and no deformity.  No tenderness to vertebral process palpation.  Paraspinous muscles are not tender and without spasm.   Range of motion is full at neck and lumbar sacral regions. Negative Straight leg raise.   No pain during or after exam- some pain getting onto table  Assessment/Plan:  Right leg pain likely due to Sciatica of right side S: right leg pain.  3/10 at max. Feels like needlines at times in back, hip, down into leg. No burning in leg. No numbness/tingling/weakness in leg. Waxes and wanes. Right hip pain into groin but also into right lower buttocks. About 2 weeks. Pain shoots into right leg including down into knee nad then later into the ankle.  Worse at night. If gets up in a recliner may help it feel better. Working makes it better it seems. Has been taking aleve twice a day- helps some.   Sugars in AM from 92-100- known increased DM risk.  A/P: This sounds like sciatica with origin in lumbar spine. Possible spinal stenosis- does not seem like slipped disc as better with sitting. We opted for 7 day trial of prednisone. If not improved would consider referral to sports medicine.   Offered films to start but he would prefer to do these if follows up with sports medicine and wants therapeutic trial of medication first in case it can reduce costs.  From avs " If not at least 50% better in 10 days- can either see me back or we can refer you to Dr. Berline Chough of sports medicine  Also let me know if worsening symptoms, leg weakness, numbness/tingling into legs "  Meds ordered this encounter  Medications  . predniSONE (DELTASONE) 20 MG tablet    Sig: Take 2 pills for 3 days, 1 pill for 4 days    Dispense:  10 tablet    Refill:  0   Return precautions advised.  Tana Conch, MD

## 2017-01-03 ENCOUNTER — Encounter: Payer: Self-pay | Admitting: Family Medicine

## 2017-01-03 ENCOUNTER — Ambulatory Visit (INDEPENDENT_AMBULATORY_CARE_PROVIDER_SITE_OTHER): Payer: PPO | Admitting: Family Medicine

## 2017-01-03 VITALS — BP 102/60 | HR 88 | Temp 98.6°F | Ht 62.5 in | Wt 196.0 lb

## 2017-01-03 DIAGNOSIS — M25551 Pain in right hip: Secondary | ICD-10-CM | POA: Diagnosis not present

## 2017-01-03 NOTE — Patient Instructions (Signed)
Considered another round of prednisone but would prefer to get more workup given how quickly pain came back when you stopped  I am worried this may be more of a hip issue at this point  Dr. Berline Choughigby will take good care of you

## 2017-01-03 NOTE — Progress Notes (Signed)
Pre visit review using our clinic review tool, if applicable. No additional management support is needed unless otherwise documented below in the visit note. 

## 2017-01-03 NOTE — Progress Notes (Signed)
Subjective:  Frank [name] is a 66 y.o. year old very pleasant male patient who presents for/with See problem oriented charting ROS- No saddle anesthesia, bladder incontinence, fecal incontinence, weakness in extremity other than when pain is severe, numbness or tingling in extremity.   Past Medical History-  Patient Active Problem List   Diagnosis Date Noted  . Acute prostatitis with hematuria 09/28/2015  . Obese 08/17/2015  . Elevated blood sugar 08/17/2015  . Balanitis 08/06/2015  . Shingles 09/10/2014  . Dyspnea 11/27/2011  . HERNIA, UMBILICAL 08/02/2008  . Essential hypertension 01/30/2008  . Allergic rhinitis 01/30/2008  . GERD 01/30/2008  . HEADACHE 01/30/2008   Medications- reviewed and updated Current Outpatient Prescriptions  Medication Sig Dispense Refill  . aspirin 81 MG tablet Take 81 mg by mouth daily.      . Fluticasone-Salmeterol (ADVAIR DISKUS) 100-50 MCG/DOSE AEPB Inhale 1 puff into the lungs 2 (two) times daily. 60 each 6  . lisinopril-hydrochlorothiazide (PRINZIDE,ZESTORETIC) 20-25 MG tablet TAKE 1 TABLET BY MOUTH EVERY DAY 100 tablet 2  . ranitidine (ZANTAC) 150 MG tablet Take 150 mg by mouth 2 (two) times daily.       No current facility-administered medications for this visit.    Objective: BP 102/60 (BP Location: Left Arm, Patient Position: Sitting, Cuff Size: Large)   Pulse 88   Temp 98.6 F (37 C) (Oral)   Ht 5' 2.5" (1.588 m)   Wt 196 lb (88.9 kg)   SpO2 97%   BMI 35.28 kg/m  Gen: NAD, resting comfortably, moves slowly to table  CV: RRR Lungs: CTAB Ext: no edema Skin: warm, dry, no rash on back or legs Neuro- no saddle anesthesia, 5/5 strength lower extremities, 2+ reflexes  Right  Hip: Largely normal ROM though some reduced flexibility- no pain with ROM last visit but today he has pain with both IR of the hip and flexion- pain into groin noted Strength   Back - Normal skin, Spine with normal alignment and no deformity.  No tenderness to  vertebral process palpation.  Paraspinous muscles are not tender and without spasm.   Negative SLR  Assessment/Plan:  Right hip pain - Plan: Ambulatory referral to Sports Medicine S: From last HPI in late February " right leg pain.  3/10 at max. Feels like needlines at times in back, hip, down into leg. No burning in leg. No numbness/tingling/weakness in leg. Waxes and wanes. Right hip pain into groin but also into right lower buttocks. About 2 weeks. Pain shoots into right leg including down into knee nad then later into the ankle.  Worse at night. If gets up in a recliner may help it feel better. Working makes it better it seems. Has been taking aleve twice a day- helps some.Sugars in AM from 92-100- known increased DM risk. "  Today, patient states that he had 100% resolution of pain within 24 hours on prednisone. He had return of prior symptoms within 2 days of stopping. He complains more today of pain into the groin when seemed focused more In low back and radiating down leg to knee last visit. Aleve helps him some. He slept in recliner again last night as it helped. Still only reports 3/10 pain but seems underreported  A/P: last visit I thought this was lumbar in origin possibly spinal stenosis as was worse with laying, better with sitting.   Given full recurrence of pain after prednisone stopped and now pain more into groin- wonder if this is more of a  hip issue. Will refer to Dr. Berline Choughigby of sports medicine. Can use aleve prn until that visit. Defer decision on lumbar and hip films to him.   This sounds like sciatica with origin in lumbar spine. Possible spinal stenosis- does not seem like slipped disc as better with sitting. We opted for 7 day trial of prednisone. If not improved would consider referral to sports medicine.   Orders Placed This Encounter  Procedures  . Ambulatory referral to Sports Medicine    Referral Priority:   Routine    Referral Type:   Consultation    Referred to  Provider:   Andrena MewsMichael D Rigby, DO    Number of Visits Requested:   1   Return precautions advised.  Tana ConchStephen Natara Monfort, MD

## 2017-01-04 ENCOUNTER — Ambulatory Visit: Payer: Self-pay

## 2017-01-04 ENCOUNTER — Ambulatory Visit (INDEPENDENT_AMBULATORY_CARE_PROVIDER_SITE_OTHER): Payer: PPO

## 2017-01-04 ENCOUNTER — Ambulatory Visit (INDEPENDENT_AMBULATORY_CARE_PROVIDER_SITE_OTHER): Payer: PPO | Admitting: Sports Medicine

## 2017-01-04 ENCOUNTER — Encounter: Payer: Self-pay | Admitting: Sports Medicine

## 2017-01-04 VITALS — BP 130/82 | HR 86 | Ht 62.5 in | Wt 192.8 lb

## 2017-01-04 DIAGNOSIS — M25551 Pain in right hip: Secondary | ICD-10-CM

## 2017-01-04 DIAGNOSIS — M79604 Pain in right leg: Secondary | ICD-10-CM | POA: Insufficient documentation

## 2017-01-04 DIAGNOSIS — M25451 Effusion, right hip: Secondary | ICD-10-CM | POA: Diagnosis not present

## 2017-01-04 NOTE — Progress Notes (Addendum)
Frank ElseCharles E [name] - 66 y.o. male MRN [id number]  Date of birth: 02/02/1951  Office Visit Note: Visit Date: 01/04/2017 PCP: Evette GeorgesDD,JEFFREY ALLEN, MD Referred by: Roderick Peeodd, Jeffrey A, MD  Subjective: Chief Complaint  Patient presents with  . pain in right leg/hip    Pt c/o pain in right hip and leg x 4 weeks. He doesn't recall injuring the leg. The pain is worse when he walks and while sleeping. The pain is steady and sharpe. The pain is in the groin, buttock, and down into the calf. Pt denied swelling in the right leg. The position of the pain varied depending on how his leg is moved. Pt was put on prednisone taper and did get relief with that but during the last 4 days the pain came back but was mild, once he stopped the medication the pain was more severe.   HPI: Patient seen as above.  Moderate improvement with steroid Dosepak.  Pain seems to be localized into the right groin.  Worse with internal and external rotation. ROS: Denies fevers, chills, recent weight gain or weight loss.  No night sweats. No significant nighttime awakenings due to this issue.  Otherwise per HPI.  Objective:  VS:  HT:5' 2.5" (158.8 cm)   WT:192 lb 12.8 oz (87.5 kg)  BMI:34.8    BP:130/82  HR:86bpm  TEMP: ( )  RESP:98 % Physical Exam: GENERAL:  WDWN, NAD, Non-toxic appearing PSYCH:  Alert & appropriately interactive  Not depressed or anxious appearing LOWER EXTREMITIES:  No significant rashes/lesions/ulcerations overlying the legs.  No significant pretibial edema.  No clubbing or cyanosis.  DP & PT pulses 2+/4.  Sensation intact to light touch. Hip and back: Well aligned.  No significant scoliosis or short leg. He has mild to moderate pain with terminal FADIR and moderate pain with FAB ER. Stinchfield test is positive. Negative straight leg raise. Lower extreme a sensation and strength is intact in dermatomes and myotomes.  Imaging & Procedures: No results found. PROCEDURE NOTE: ULTRASOUND  GUIDED RIGHT HIP INTRA-ARTICULARINJECTION  Images were obtained and interpreted by myself, Gaspar BiddingMichael Yovana Scogin, DO  Images have been saved and stored to PACS system. Images obtained on: GE S7 Ultrasound machine  DESCRIPTION OF PROCEDURE:  The patient's clinical condition is marked by substantial pain and/or significant functional disability. Other conservative therapy has not provided relief, is contraindicated, or not appropriate. There is a reasonable likelihood that injection will significantly improve the patient's pain and/or functional impairment. After discussing the risks, benefits and expected outcomes of the injection and all questions were reviewed and answered, the patient wished to undergo the above named procedure. Verbal consent was obtained. The ultrasound was used to identify the target structure and adjacent neurovascular structures. The skin was then prepped in sterile fashion and the target structure was injected under direct visualization using sterile technique as below: PREP: Alcohol, Ethel Chloride APPROACH: Anterior,Stop-cock technique, 22g 3.5" needle INJECTATE: 5cc 1% lidocaine, 2 cc 0.5% marcaine, 2cc 40mg  DepoMedrol ASPIRATE: N/A DRESSING: Band-Aid  Post procedural instructions including recommending icing and warning signs for infection were reviewed. This procedure was well tolerated and there were no complications.   IMPRESSION: Succesful US Guided Injection    Assessment & Plan: Problem List Items Addressed This Visit    Hip effusion, right    Small right hip effusion appreciated on exam with calcification of the anterior labrum without overt tearing.  Intra-articular injection performed today with good short-term improvement.  If any lack of improvement will need  further diagnostic workup of lumbar spine but this seems to be more focally and intra-articular issue in spite of fairly normal x-rays.       Other Visit Diagnoses    Pain in right hip    -   Primary   Relevant Orders   DG HIP UNILAT W OR W/O PELVIS 2-3 VIEWS RIGHT (Completed)   US Guided Needle Placement      Follow-up: Return in about 4 weeks (around 02/01/2017).   Past Medical/Family/Surgical/Social History: Medications & Allergies reviewed per EMR Patient Active Problem List   Diagnosis Date Noted  . Hip effusion, right 01/04/2017  . Acute prostatitis with hematuria 09/28/2015  . Obese 08/17/2015  . Elevated blood sugar 08/17/2015  . Balanitis 08/06/2015  . Shingles 09/10/2014  . Dyspnea 11/27/2011  . HERNIA, UMBILICAL 08/02/2008  . Essential hypertension 01/30/2008  . Allergic rhinitis 01/30/2008  . GERD 01/30/2008  . HEADACHE 01/30/2008   Past Medical History:  Diagnosis Date  . Allergy   . Asthma   . GERD (gastroesophageal reflux disease)   . Headache(784.0)   . Hypertension    Family History  Problem Relation Age of Onset  . Kidney disease Mother   . COPD Father   . Pneumonia Father   . Cardiomyopathy Brother   . Hypertension Sister   . Heart disease Brother   . Hypertension Brother   . Hypertension Brother   . Hypertension Sister   . Diabetes Sister   . Osteoarthritis Sister    Past Surgical History:  Procedure Laterality Date  . HERNIA REPAIR  1969, 2009  . kidney stones  05/20/95  . KNEE SURGERY  02/1989  . NASAL SINUS SURGERY  10/24/1992  . ROTATOR CUFF REPAIR  04/25/93   Social History   Occupational History  . Not on file.   Social History Main Topics  . Smoking status: Former Games developer  . Smokeless tobacco: Never Used     Comment: quit in 1972  . Alcohol use No  . Drug use: No  . Sexual activity: Not on file

## 2017-01-08 NOTE — Assessment & Plan Note (Signed)
Small right hip effusion appreciated on exam with calcification of the anterior labrum without overt tearing.  Intra-articular injection performed today with good short-term improvement.  If any lack of improvement will need further diagnostic workup of lumbar spine but this seems to be more focally and intra-articular issue in spite of fairly normal x-rays.

## 2017-01-14 ENCOUNTER — Ambulatory Visit (INDEPENDENT_AMBULATORY_CARE_PROVIDER_SITE_OTHER): Payer: PPO

## 2017-01-14 ENCOUNTER — Ambulatory Visit (INDEPENDENT_AMBULATORY_CARE_PROVIDER_SITE_OTHER): Payer: PPO | Admitting: Sports Medicine

## 2017-01-14 ENCOUNTER — Telehealth: Payer: Self-pay | Admitting: Sports Medicine

## 2017-01-14 ENCOUNTER — Encounter: Payer: Self-pay | Admitting: Sports Medicine

## 2017-01-14 VITALS — BP 140/88 | HR 87 | Ht 63.0 in | Wt 195.4 lb

## 2017-01-14 DIAGNOSIS — M25451 Effusion, right hip: Secondary | ICD-10-CM | POA: Diagnosis not present

## 2017-01-14 DIAGNOSIS — M47816 Spondylosis without myelopathy or radiculopathy, lumbar region: Secondary | ICD-10-CM | POA: Diagnosis not present

## 2017-01-14 DIAGNOSIS — M79604 Pain in right leg: Secondary | ICD-10-CM

## 2017-01-14 MED ORDER — TRAMADOL HCL 50 MG PO TABS: 50.0000 mg | ORAL_TABLET | Freq: Four times a day (QID) | ORAL | 1 refills | Status: DC | PRN

## 2017-01-14 MED ORDER — CELECOXIB 100 MG PO CAPS: 100.0000 mg | ORAL_CAPSULE | Freq: Two times a day (BID) | ORAL | 2 refills | Status: DC | PRN

## 2017-01-14 NOTE — Patient Instructions (Signed)
If you are having any worsening symptoms please call and let us know so that we can set you up for an MRI of your hip.

## 2017-01-14 NOTE — Assessment & Plan Note (Signed)
Symptoms are consistent with right hip degenerative change.  There was a small effusion noted on ultrasound last visit.  He had almost complete resolution of the symptoms with intra-articular injection with the anesthetic component.  We will obtain x-rays today to rule out any significant lumbar component.  We will also start him on tramadol and Celebrex to see if we can provide him symptomatic relief at this time.  If any worsening features patient will call to get set up for an MRI of his right hip.

## 2017-01-14 NOTE — Progress Notes (Signed)
Frank [name] - 66 y.o. male MRN [id number]  Date of birth: [birth year]  Office Visit Note: Visit Date: 01/14/2017 PCP: Evette Georges, MD Referred by: Roderick Pee, MD  Subjective: Chief Complaint  Patient presents with  . pain in right leg/hip   HPI: Patient presents for reevaluation of right hip leg buttock pain that has been present for several months.  He underwent injection 2 weeks ago and had complete resolution of his symptoms for approximately 7 days.  His symptoms were essentially gone within the first 2 hours after the injection.  He has had a slow return of symptoms with currently rating his pain is 1 out of 10 but this is waking him up and interfering with his ability to sleep restfully.  He has only minimal interference in his day-to-day activities.  Ibuprofen has been helpful. ROS: Otherwise per HPI.  Objective:  VS:  HT:5\' 3"  (160 cm)   WT:195 lb 6.4 oz (88.6 kg)  BMI:34.7    BP:140/88  HR:87bpm  TEMP: ( )  RESP:98 % Physical Exam: GENERAL:  WDWN, NAD, Non-toxic appearing PSYCH:  Alert & appropriately interactive  Not depressed or anxious appearing LOWER EXTREMITIES:  No significant rashes/lesions/ulcerations overlying the legs.  No significant pretibial edema.  No clubbing or cyanosis.  DP & PT pulses 2+/4.  Sensation intact to light touch. RIGHT HIP:  Pain with terminal internal rotation.    He has a negative straight leg raise.  He has good internal and external rotation in a seated position with once again only pain with FADIR.    Lower extremity sensation is intact.  He is able to heel toe walk without difficulty.  He has a slightly antalgic gait.   Imaging & Procedures: No results found. Prior x-rays did show spurring at the inferior aspect of the acetabulum as well as soft tissue calcification of the superior aspect of the joint line.  I suspect degenerative changes are more advanced than x-ray suggests.  Lumbar spine x-rays  obtained today did show a moderate degree of degenerative changes at the lower lumbar segments.  No significant changes at L2-L3-L4 Assessment & Plan: Problem List Items Addressed This Visit    Right leg pain - Primary    Symptoms are consistent with right hip degenerative change.  There was a small effusion noted on ultrasound last visit.  He had almost complete resolution of the symptoms with intra-articular injection with the anesthetic component.  We will obtain x-rays today to rule out any significant lumbar component.  We will also start him on tramadol and Celebrex to see if we can provide him symptomatic relief at this time.  If any worsening features patient will call to get set up for an MRI of his right hip.         Follow-up: Return if symptoms worsen or fail to improve.  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 Right leg pain 01/04/2017  . Acute prostatitis with hematuria 09/28/2015  . Obese 08/17/2015  . Elevated blood sugar 08/17/2015  . Balanitis 08/06/2015  . Shingles 09/10/2014  . Dyspnea 11/27/2011  . HERNIA, UMBILICAL 08/02/2008  . Essential hypertension 01/30/2008  . Allergic rhinitis 01/30/2008  . GERD 01/30/2008  . HEADACHE 01/30/2008   Past Medical History:  Diagnosis Date  . Allergy   . Asthma   .  GERD (gastroesophageal reflux disease)   . Headache(784.0)   . Hypertension    Family History  Problem Relation Age of Onset  . Kidney disease Mother   . COPD Father   . Pneumonia Father   . Cardiomyopathy Brother   . Hypertension Sister   . Heart disease Brother   . Hypertension Brother   . Hypertension Brother   . Hypertension Sister   . Diabetes Sister   . Osteoarthritis Sister    Past Surgical History:  Procedure Laterality Date  . HERNIA REPAIR  1969, 2009  . kidney  stones  05/20/95  . KNEE SURGERY  02/1989  . NASAL SINUS SURGERY  10/24/1992  . ROTATOR CUFF REPAIR  04/25/93   Social History   Occupational History  . Not on file.   Social History Main Topics  . Smoking status: Former Games developermoker  . Smokeless tobacco: Never Used     Comment: quit in 1972  . Alcohol use No  . Drug use: No  . Sexual activity: Not on file

## 2017-01-16 NOTE — Telephone Encounter (Signed)
Opened in error

## 2017-01-22 ENCOUNTER — Telehealth: Payer: Self-pay | Admitting: Family Medicine

## 2017-01-22 ENCOUNTER — Other Ambulatory Visit: Payer: Self-pay | Admitting: Sports Medicine

## 2017-01-22 DIAGNOSIS — Z77018 Contact with and (suspected) exposure to other hazardous metals: Secondary | ICD-10-CM

## 2017-01-22 DIAGNOSIS — M25551 Pain in right hip: Secondary | ICD-10-CM

## 2017-01-22 MED ORDER — METHYLPREDNISOLONE 4 MG PO TBPK: ORAL_TABLET | ORAL | 0 refills | Status: DC

## 2017-01-22 NOTE — Telephone Encounter (Signed)
Patient still has right leg pain and would like to know if he can be prescribed something different for pain.  Thank you,  -LL

## 2017-01-22 NOTE — Telephone Encounter (Signed)
You saw pt on 01-14-2017, prescribed tramadol 50mg   1 tablet q6h prn for pain. PLease advise if there are any other medication options for him. Per OV note--if sx are not better then next step is MRI Right hip.

## 2017-01-22 NOTE — Telephone Encounter (Signed)
Pt is agreeable to have the MRI. He is taking Tylenol every 4-6 hours with relief. Takes the Tramadol 50mg  1 tablet at night only.  He is trying to take Celebrex in the AM but medication is giving him loose stools. Pt did mention that Prednisone did give him the most relief and he didn't know if another round of medication would be an option until he is scheduled for MRI.

## 2017-01-22 NOTE — Telephone Encounter (Signed)
There aren't any stronger medications that would be appropriate at this time.  If he is hurting that badly he needs to have an MRI Arthrogram of his Right Hip.  I will place order . Marland Kitchen. .Marland Kitchen

## 2017-01-22 NOTE — Telephone Encounter (Signed)
Spoke with patient--he is aware that medication has been sent to pharmacy. [facility] [place]---they will be contacting him to set this up. He is aware and agreeable.

## 2017-01-22 NOTE — Telephone Encounter (Signed)
Rx for Steroid has been sent in. No further Steroid use recommended after this round. F/u after MRI

## 2017-01-28 ENCOUNTER — Other Ambulatory Visit: Payer: PPO

## 2017-01-28 ENCOUNTER — Ambulatory Visit: Payer: PPO | Admitting: Sports Medicine

## 2017-01-29 ENCOUNTER — Other Ambulatory Visit: Payer: Self-pay

## 2017-01-29 ENCOUNTER — Telehealth: Payer: Self-pay | Admitting: Family Medicine

## 2017-01-29 ENCOUNTER — Other Ambulatory Visit: Payer: Self-pay | Admitting: Sports Medicine

## 2017-01-29 MED ORDER — ACETAMINOPHEN-CODEINE #3 300-30 MG PO TABS: 1.0000 | ORAL_TABLET | Freq: Three times a day (TID) | ORAL | 0 refills | Status: DC | PRN

## 2017-01-29 NOTE — Telephone Encounter (Signed)
Spoke with Dr. Berline Chough and he said OK to call in Tylenol #3, take 1 every 8 hours as needed, #30/0 refills. Called pt and advised. Rx called in to [facility].

## 2017-01-29 NOTE — Telephone Encounter (Signed)
**  Remind patient they can make refill requests via MyChart**  Medication refill request (Name & Dosage):  methylPREDNISolone (MEDROL DOSEPAK) 4 MG TBPK tablet  Preferred pharmacy (Name & Address):  [id number] - [place], [place] - [address]   Other comments (if applicable):   Would like medication refilled until his MRI is approved.

## 2017-02-04 ENCOUNTER — Ambulatory Visit: Payer: PPO | Admitting: Sports Medicine

## 2017-02-06 ENCOUNTER — Other Ambulatory Visit: Payer: PPO

## 2017-02-11 ENCOUNTER — Ambulatory Visit (INDEPENDENT_AMBULATORY_CARE_PROVIDER_SITE_OTHER): Payer: PPO

## 2017-02-11 ENCOUNTER — Ambulatory Visit (INDEPENDENT_AMBULATORY_CARE_PROVIDER_SITE_OTHER): Payer: PPO | Admitting: Sports Medicine

## 2017-02-11 ENCOUNTER — Encounter: Payer: Self-pay | Admitting: Sports Medicine

## 2017-02-11 VITALS — BP 107/68 | HR 71 | Resp 16 | Wt 192.0 lb

## 2017-02-11 DIAGNOSIS — Z77018 Contact with and (suspected) exposure to other hazardous metals: Secondary | ICD-10-CM

## 2017-02-11 DIAGNOSIS — M25451 Effusion, right hip: Secondary | ICD-10-CM | POA: Diagnosis not present

## 2017-02-11 DIAGNOSIS — S73191A Other sprain of right hip, initial encounter: Secondary | ICD-10-CM | POA: Diagnosis not present

## 2017-02-11 DIAGNOSIS — X58XXXA Exposure to other specified factors, initial encounter: Secondary | ICD-10-CM

## 2017-02-11 DIAGNOSIS — M25551 Pain in right hip: Secondary | ICD-10-CM

## 2017-02-11 DIAGNOSIS — Z0389 Encounter for observation for other suspected diseases and conditions ruled out: Secondary | ICD-10-CM | POA: Diagnosis not present

## 2017-02-11 NOTE — Progress Notes (Signed)
  Procedure: Real-time Ultrasound Guided gadolinium contrast injection of right hip joint Device: GE Logiq E  Verbal informed consent obtained.  Time-out conducted.  Noted no overlying erythema, induration, or other signs of local infection.  Skin prepped in a sterile fashion.  Local anesthesia: Topical Ethyl chloride.  With sterile technique and under real time ultrasound guidance:  Using a 22-gauge spinal needle advanced to the femoral head/neck junction, contacted bone and then injected 1 mL kenalog 40, 2 mL lidocaine, 2 mL bupivacaine, syringe switched and 0.1 mL gadolinium injected, syringe again switched and 10 mL sterile saline used to flush the needle into the joint. Joint visualized and capsule seen distending confirming intra-articular placement of contrast material and medication. Completed without difficulty  Advised to call if fevers/chills, erythema, induration, drainage, or persistent bleeding.  Images permanently stored and available for review in the ultrasound unit.  Impression: Technically successful ultrasound guided gadolinium contrast injection for MR arthrography.  Please see separate MR arthrogram report.

## 2017-02-12 NOTE — Progress Notes (Signed)
Spoke with Dr. Allie Bossier briefly about this patient and appreciate his insight.  Ultimately given the isolated degenerative labral tearing he may be a candidate for hip arthroscopy and we can consider referring him to Dr. Caswell Corwin for an opinion on this.  Ultimately he may require total hip arthroplasty if other conservative management fails.  Called and spoke with the patient today.  Given the extensive degenerative labral tear hip arthroscopy is a possibility but given his age he is likely a poor candidate for this.  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

## 2017-02-15 ENCOUNTER — Encounter: Payer: Self-pay | Admitting: Sports Medicine

## 2017-02-15 ENCOUNTER — Ambulatory Visit: Payer: Self-pay

## 2017-02-15 ENCOUNTER — Ambulatory Visit (INDEPENDENT_AMBULATORY_CARE_PROVIDER_SITE_OTHER): Payer: PPO | Admitting: Sports Medicine

## 2017-02-15 VITALS — BP 128/82 | HR 67 | Ht 63.0 in | Wt 195.2 lb

## 2017-02-15 DIAGNOSIS — S73191D Other sprain of right hip, subsequent encounter: Secondary | ICD-10-CM | POA: Diagnosis not present

## 2017-02-15 DIAGNOSIS — M25512 Pain in left shoulder: Secondary | ICD-10-CM

## 2017-02-15 DIAGNOSIS — G8929 Other chronic pain: Secondary | ICD-10-CM | POA: Diagnosis not present

## 2017-02-15 DIAGNOSIS — M542 Cervicalgia: Secondary | ICD-10-CM | POA: Insufficient documentation

## 2017-02-15 DIAGNOSIS — M1611 Unilateral primary osteoarthritis, right hip: Secondary | ICD-10-CM

## 2017-02-15 DIAGNOSIS — M1990 Unspecified osteoarthritis, unspecified site: Secondary | ICD-10-CM | POA: Insufficient documentation

## 2017-02-15 DIAGNOSIS — M25551 Pain in right hip: Secondary | ICD-10-CM

## 2017-02-15 DIAGNOSIS — S73191A Other sprain of right hip, initial encounter: Secondary | ICD-10-CM | POA: Insufficient documentation

## 2017-02-15 HISTORY — DX: Cervicalgia: M54.2

## 2017-02-15 MED ORDER — PREDNISONE 20 MG PO TABS: 20.0000 mg | ORAL_TABLET | Freq: Every day | ORAL | 0 refills | Status: DC | PRN

## 2017-02-15 NOTE — Assessment & Plan Note (Signed)
See above

## 2017-02-15 NOTE — Assessment & Plan Note (Addendum)
Injection today.  I suspect this is majority of his symptoms especially with squatting.  He has only minimal degenerative change.  If any lack of improvement can> Consider: Right total hip arthroplasty but would like to defer this as long as possible.  Follow-up in 6 weeks for recheck of shoulder/neck and consideration of referral to surgery versus plan for repeat injections at 10-12 weeks.  Patient responded quite well steroids and we will give him 1 additional safety prescription for his upcoming events.  If any lack of improvement further steroid use should not be continued.

## 2017-02-15 NOTE — Progress Notes (Signed)
OFFICE VISIT NOTE [name]. [name] Sports Medicine [facility] at [facility] [phone]  TOR TSUDA - 66 y.o. male MRN [id number]  Date of birth: [birth year]  Visit Date: 02/15/2017  PCP: Evette Georges, MD   Referred by: Roderick Pee, MD  Orlie Dakin, CMA acting as scribe for Dr. Berline Chough.  SUBJECTIVE:   Chief Complaint  Patient presents with  . Follow-up    right hip pain  . Initial Assessment    left shoulder   Follow-up on right hip pain Pain started about 2-3 months ago No eliciting mechanism.  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  The pain is described as throbbing and is rated as 3/10.  Worsened with walking upward Improves with Prednisone, steroid injection Therapies tried include Celebrex, Tramadol, steroid injection, prednisone dose pack  Other associated symptoms include: gluteal pain  He also reports the left shoulder has been slightly bothersome for him.  He has pain with excessive lifting.  Some weakness.  Prior rotator cuff issues.  No significant focal weakness.  Otherwise ROS as it pertains to the Chief Complaint is as below:     Review of Systems  Constitutional: Negative for chills, fever and weight loss.  Cardiovascular: Negative for leg swelling.  Musculoskeletal: Positive for joint pain.  Neurological: Positive for tingling.    Otherwise per HPI.  HISTORY & PERTINENT PRIOR DATA:  No specialty comments available. He reports that he has quit smoking. He has never used smokeless tobacco. No results for input(s): HGBA1C, LABURIC in the last 8760 hours. Medications & Allergies reviewed per EMR Patient Active Problem List   Diagnosis Date Noted  . Neck pain 02/15/2017  . Primary osteoarthritis of right hip 02/15/2017  . Tear of right acetabular labrum 02/15/2017  . Right leg pain 01/04/2017  . Acute prostatitis with hematuria 09/28/2015  .  Obese 08/17/2015  . Elevated blood sugar 08/17/2015  . Balanitis 08/06/2015  . Shingles 09/10/2014  . Dyspnea 11/27/2011  . HERNIA, UMBILICAL 08/02/2008  . Essential hypertension 01/30/2008  . Allergic rhinitis 01/30/2008  . GERD 01/30/2008  . HEADACHE 01/30/2008   Past Medical History:  Diagnosis Date  . Allergy   . Asthma   . GERD (gastroesophageal reflux disease)   . Headache(784.0)   . Hypertension    Family History  Problem Relation Age of Onset  . Kidney disease Mother   . COPD Father   . Pneumonia Father   . Cardiomyopathy Brother   . Hypertension Sister   . Heart disease Brother   . Hypertension Brother   . Hypertension Brother   . Hypertension Sister   . Diabetes Sister   . Osteoarthritis Sister    Past Surgical History:  Procedure Laterality Date  . HERNIA REPAIR  1969, 2009  . kidney stones  05/20/95  . KNEE SURGERY  02/1989  . NASAL SINUS SURGERY  10/24/1992  . ROTATOR CUFF REPAIR  04/25/93   Social History   Occupational History  . Not on file.   Social History Main Topics  . Smoking status: Former Games developer  . Smokeless tobacco: Never Used     Comment: quit in 1972  . Alcohol use No  . Drug use: No  . Sexual activity: Not on file    OBJECTIVE:  VS:  HT:5\' 3"  (160 cm)   WT:195 lb 3.2 oz (88.5 kg)  BMI:34.7  BP:128/82  HR:67bpm  TEMP: ( )  RESP:97 % Physical Exam  Constitutional: He appears well-developed and well-nourished. He is cooperative.  Non-toxic appearance.  HENT:  Head: Normocephalic and atraumatic.  Cardiovascular: Intact distal pulses.   Pulmonary/Chest: No accessory muscle usage. No respiratory distress.  Neurological: He is alert. He is not disoriented. He displays normal reflexes. No sensory deficit.  Skin: Skin is warm, dry and intact. Capillary refill takes less than 2 seconds. No abrasion and no rash noted.  Psychiatric: He has a normal mood and affect. His speech is normal and behavior is normal. Thought content normal.    Right hip: Pain with Stinchfield testing and internal rotation/logroll. Left shoulder: Pain with empty can testing.  No pain with Hawkins or Neer's.   IMAGING & PROCEDURES: Dg Eye Foreign Body  Result Date: 02/11/2017 CLINICAL DATA:  Metal working/exposure; clearance prior to MRI EXAM: ORBITS FOR FOREIGN BODY - 2 VIEW COMPARISON:  None. FINDINGS: There is no evidence of metallic foreign body within the orbits. No significant bone abnormality identified. IMPRESSION: No evidence of metallic foreign body within the orbits. Electronically Signed   By: Marnee Spring M.D.   On: 02/11/2017 14:23   Mr Hip Right W Contrast  Result Date: 02/11/2017 CLINICAL DATA:  Chronic right hip and groin pain.  No known injury. EXAM: MRI OF THE RIGHT HIP WITH CONTRAST(MR Arthrogram) TECHNIQUE: Multiplanar, multisequence MR imaging of the hip was performed immediately following contrast injection into the hip joint under fluoroscopic guidance. No intravenous contrast was administered. COMPARISON:  Plain films right hip 01/04/2017. FINDINGS: Bones: Bone marrow signal is normal without fracture stress change. No focal lesion. No avascular necrosis of the femoral heads. Articular cartilage and labrum Articular cartilage:  Mildly degenerated. Labrum: There is a large tear of the anterior, superior labrum. The superior labrum has an abnormal configuration consistent with degeneration. Joint or bursal effusion Joint effusion:  The right hip is distended with contrast. Bursae:  Negative. Muscles and tendons Muscles and tendons:  Intact. Other findings Miscellaneous: Intrapelvic contents demonstrate no acute abnormality. The prostate gland is prominent. IMPRESSION: Extensive degenerative tearing of the anterior, superior labrum. The superior labrum is markedly degenerated. Prostatomegaly. Electronically Signed   By: Drusilla Kanner M.D.   On: 02/11/2017 16:50   No additional findings.  PROCEDURE NOTE: ULTRASOUND GUIDED RIGHT  HIPINJECTION  Images were obtained and interpreted by myself, Gaspar Bidding, DO  Images have been saved and stored to PACS system. Images obtained on: GE S7 Ultrasound machine  ULTRASOUND FINDINGS: small amount of IA fluid - small labral tear DESCRIPTION OF PROCEDURE:  The patient's clinical condition is marked by substantial pain and/or significant functional disability. Other conservative therapy has not provided relief, is contraindicated, or not appropriate. There is a reasonable likelihood that injection will significantly improve the patient's pain and/or functional impairment. After discussing the risks, benefits and expected outcomes of the injection and all questions were reviewed and answered, the patient wished to undergo the above named procedure. Verbal consent was obtained. The ultrasound was used to identify the target structure and adjacent neurovascular structures. The skin was then prepped in sterile fashion and the target structure was injected under direct visualization using sterile technique as below: PREP: Alcohol, Ethel Chloride APPROACH: Stopcock technique, 22g 3.5" needle INJECTATE: 4cc 1% lidocaine, 2cc 0.5% marcaine, 2cc  DepoMedrol ASPIRATE: N/A DRESSING: Band-Aid   Post procedural instructions including recommending icing and warning signs for infection were reviewed. This procedure was well tolerated and there  were no complications.   IMPRESSION: Succesful US Guided Injection    ASSESSMENT & PLAN:  Visit Diagnoses:  1. Chronic left shoulder pain   2. Right hip pain   3. Neck pain   4. Tear of right acetabular labrum, subsequent encounter   5. Primary osteoarthritis of right hip    Meds:  Meds ordered this encounter  Medications  . predniSONE (DELTASONE) 20 MG tablet    Sig: Take 1 tablet (20 mg total) by mouth daily as needed.    Dispense:  5 tablet    Refill:  0    Orders:  Orders Placed This Encounter  Procedures  . [place] LIMITED JOINT  SPACE STRUCTURES LOW RIGHT(NO LINKED CHARGES)    Follow-up: Return in about 10 weeks (around 04/26/2017).  Otherwise please see problem oriented charting as below.  CMA/ATC served as Neurosurgeon during this visit. History, Physical, and Plan performed by medical provider. Documentation and orders reviewed and attested to.      Gaspar Bidding, DO     Sports Medicine Physician    02/16/2017 12:12 PM

## 2017-02-15 NOTE — Assessment & Plan Note (Addendum)
Left shoulder and left-sided neck pain consistent with likely underlying cervical spondylosis and prior left supraspinatus repair Therapeutic exercises reviewed with athletic training staff including range of motion of the cervical spine and shoulder strengthening/scapular stabilization.

## 2017-02-15 NOTE — Patient Instructions (Signed)
Please perform the exercise program that [facility] [name] has prepared for you and gone over in detail on a daily basis.  In addition to the handout you were provided you can access your program through: [email or web address]   Your unique program code is: [email or web address]

## 2017-04-29 ENCOUNTER — Ambulatory Visit (INDEPENDENT_AMBULATORY_CARE_PROVIDER_SITE_OTHER): Payer: PPO | Admitting: Sports Medicine

## 2017-04-29 ENCOUNTER — Ambulatory Visit (INDEPENDENT_AMBULATORY_CARE_PROVIDER_SITE_OTHER): Payer: PPO

## 2017-04-29 ENCOUNTER — Encounter: Payer: Self-pay | Admitting: Sports Medicine

## 2017-04-29 VITALS — BP 140/94 | HR 75 | Ht 63.0 in | Wt 196.8 lb

## 2017-04-29 DIAGNOSIS — M1611 Unilateral primary osteoarthritis, right hip: Secondary | ICD-10-CM

## 2017-04-29 DIAGNOSIS — M170 Bilateral primary osteoarthritis of knee: Secondary | ICD-10-CM | POA: Diagnosis not present

## 2017-04-29 DIAGNOSIS — M1711 Unilateral primary osteoarthritis, right knee: Secondary | ICD-10-CM | POA: Diagnosis not present

## 2017-04-29 DIAGNOSIS — M25561 Pain in right knee: Secondary | ICD-10-CM | POA: Diagnosis not present

## 2017-04-29 DIAGNOSIS — M130 Polyarthritis, unspecified: Secondary | ICD-10-CM | POA: Diagnosis not present

## 2017-04-29 DIAGNOSIS — M79604 Pain in right leg: Secondary | ICD-10-CM | POA: Diagnosis not present

## 2017-04-29 LAB — CBC WITH DIFFERENTIAL/PLATELET
BASOS ABS: 0.1 10*3/uL (ref 0.0–0.1)
Basophils Relative: 1 % (ref 0.0–3.0)
EOS PCT: 3.5 % (ref 0.0–5.0)
Eosinophils Absolute: 0.2 10*3/uL (ref 0.0–0.7)
HCT: 40.9 % (ref 39.0–52.0)
HEMOGLOBIN: 14.1 g/dL (ref 13.0–17.0)
Lymphocytes Relative: 25.2 % (ref 12.0–46.0)
Lymphs Abs: 1.5 10*3/uL (ref 0.7–4.0)
MCHC: 34.4 g/dL (ref 30.0–36.0)
MCV: 91.3 fl (ref 78.0–100.0)
MONO ABS: 0.5 10*3/uL (ref 0.1–1.0)
Monocytes Relative: 9.1 % (ref 3.0–12.0)
Neutro Abs: 3.5 10*3/uL (ref 1.4–7.7)
Neutrophils Relative %: 61.2 % (ref 43.0–77.0)
Platelets: 255 10*3/uL (ref 150.0–400.0)
RBC: 4.48 Mil/uL (ref 4.22–5.81)
RDW: 14.3 % (ref 11.5–15.5)
WBC: 5.8 10*3/uL (ref 4.0–10.5)

## 2017-04-29 LAB — COMPREHENSIVE METABOLIC PANEL
ALK PHOS: 35 U/L — AB (ref 39–117)
ALT: 10 U/L (ref 0–53)
AST: 13 U/L (ref 0–37)
Albumin: 4 g/dL (ref 3.5–5.2)
BUN: 26 mg/dL — ABNORMAL HIGH (ref 6–23)
CO2: 28 meq/L (ref 19–32)
Calcium: 9.5 mg/dL (ref 8.4–10.5)
Chloride: 105 mEq/L (ref 96–112)
Creatinine, Ser: 0.93 mg/dL (ref 0.40–1.50)
GFR: 86.49 mL/min (ref >60.00)
GLUCOSE: 107 mg/dL — AB (ref 70–99)
POTASSIUM: 3.4 meq/L — AB (ref 3.5–5.1)
Sodium: 142 mEq/L (ref 135–145)
TOTAL PROTEIN: 6.9 g/dL (ref 6.0–8.3)
Total Bilirubin: 0.4 mg/dL (ref 0.2–1.2)

## 2017-04-29 LAB — SEDIMENTATION RATE: SED RATE: 9 mm/h (ref 0–20)

## 2017-04-29 LAB — C-REACTIVE PROTEIN: CRP: 0.4 mg/dL — AB (ref 0.5–20.0)

## 2017-04-29 LAB — URIC ACID: Uric Acid, Serum: 6 mg/dL (ref 4.0–7.8)

## 2017-04-29 NOTE — Patient Instructions (Signed)
I recommend you obtained a compression sleeve to help with your joint problems. There are many options on the market however I recommend obtaining a full knee Body Helix compression sleeve.  You can find information (including how to appropriate measure yourself for sizing) can be found at www.Body Helix.com.  Many of these products are health savings account (HSA) eligible.   You can use the compression sleeve at any time throughout the day but is most important to use while being active as well as for 2 hours post-activity.   It is appropriate to ice following activity with the compression sleeve in place.   

## 2017-04-29 NOTE — Progress Notes (Signed)
OFFICE VISIT NOTE [name]. [name] Sports Medicine [facility] at [name] [phone]  Frank [name] - 66 y.o. male MRN [id number]  Date of birth: 1950-11-29  Visit Date: 04/29/2017  PCP: Roderick Pee, MD   Referred by: Roderick Pee, MD  Orlie Dakin, CMA acting as scribe for Dr. Berline Chough.  SUBJECTIVE:   Chief Complaint  Patient presents with  . Follow-up    left shoulder pain   HPI: As below and per problem based documentation when appropriate.  Pt presents today in follow-up of left shoulder and right hip pain. He received steroid injection 02/05/17 in the right hip. He was given home therapeutic exercises and prescribed Prednisone 20 mg to take 1 qd prn.  He has not needed to take a steroid.  Pt reports improvement in left shoulder pain. He has pain that comes and goes When pain is present it is about 3/10. He has good ROM. He doesn't recall anything in particular that causes the pain to flare-up.   Pt reports improvement in right hip pain after receiving the steroid injection. He has been avoiding squatting. He has been taking his time going up and down stairs. He has the Prednisone but hasn't really had to take it because he hasn't really been bothered by his hip. He has been taking Tylenol 2 tablets BID.   Pt reports that he has now started having pain in the right knee. The pain started about 2 weeks ago. He reports that the pain is worse when he is in bed. The pain seems to be in the calf and behind the knee. The pain improves after getting up and moving around. The pain is described as sharp, "like when you pull a muscle of something like that". Pt denies swelling in the right leg, errythma, increased warmth. He has no pain in the knee going up or down stairs because he leads with the left leg. When he bends the knee his has a pulling sensation on the medial and lateral aspect of the right knee, lateral worse than medial.   Pt. Denies fever,  chills, night sweats.     Review of Systems  Constitutional: Negative for chills and fever.  Respiratory: Positive for wheezing. Negative for shortness of breath.   Cardiovascular: Negative for chest pain, palpitations and leg swelling.  Musculoskeletal: Positive for joint pain. Negative for falls.  Neurological: Negative for dizziness and headaches.  Endo/Heme/Allergies: Does not bruise/bleed easily.    Otherwise per HPI.  HISTORY & PERTINENT PRIOR DATA:  No specialty comments available. He reports that he has quit smoking. He has never used smokeless tobacco.   Recent Labs  04/29/17 0845  LABURIC 6.0   Medications & Allergies reviewed per EMR Patient Active Problem List   Diagnosis Date Noted  . Right knee pain 04/29/2017  . Neck pain 02/15/2017  . Primary osteoarthritis of right hip 02/15/2017  . Tear of right acetabular labrum 02/15/2017  . Right leg pain 01/04/2017  . Acute prostatitis with hematuria 09/28/2015  . Obese 08/17/2015  . Elevated blood sugar 08/17/2015  . Balanitis 08/06/2015  . Shingles 09/10/2014  . Dyspnea 11/27/2011  . HERNIA, UMBILICAL 08/02/2008  . Essential hypertension 01/30/2008  . Allergic rhinitis 01/30/2008  . GERD 01/30/2008  . HEADACHE 01/30/2008   Past Medical History:  Diagnosis Date  . Allergy   . Asthma   . GERD (gastroesophageal reflux disease)   . Headache(784.0)   . Hypertension  Family History  Problem Relation Age of Onset  . Kidney disease Mother   . COPD Father   . Pneumonia Father   . Cardiomyopathy Brother   . Hypertension Sister   . Heart disease Brother   . Hypertension Brother   . Hypertension Brother   . Hypertension Sister   . Diabetes Sister   . Osteoarthritis Sister    Past Surgical History:  Procedure Laterality Date  . HERNIA REPAIR  1969, 2009  . kidney stones  05/20/95  . KNEE SURGERY  02/1989  . NASAL SINUS SURGERY  10/24/1992  . ROTATOR CUFF REPAIR  04/25/93   Social History   Occupational  History  . Not on file.   Social History Main Topics  . Smoking status: Former Games developer  . Smokeless tobacco: Never Used     Comment: quit in 1972  . Alcohol use No  . Drug use: No  . Sexual activity: Not on file    OBJECTIVE:  VS:  HT:5\' 3"  (160 cm)   WT:196 lb 12.8 oz (89.3 kg)  BMI:34.9    BP:(!) 140/94  HR:75bpm  TEMP: ( )  RESP:96 % EXAM: Findings:  WDWN, NAD, Non-toxic appearing Alert & appropriately interactive Not depressed or anxious appearing No increased work of breathing. Pupils are equal. EOM intact without nystagmus No clubbing or cyanosis of the extremities appreciated No significant rashes/lesions/ulcerations overlying the examined area. DP & PT pulses 2+/4.  No significant pretibial edema. Sensation intact to light touch in lower extremities.  Bilateral knees: Overall well aligned.  No significant effusion today. He has 3-4 mm of opening with valgus stressing.  Stable endpoint.  Negative McMurray's. Crepitation and slight pain with patellar grind.  Extensor mechanism intact. No significant Baker's cyst appreciated.  Bilateral hips.  Improved pain with internal and external rotation.     Dg Knee 1-2 Views Right  Result Date: 04/29/2017 CLINICAL DATA:  Posterior right knee pain radiating into the calf for the past 2 weeks with no known injury. Symptoms improved after arising from bed and being up and moving around. EXAM: RIGHT KNEE - 1-2 VIEW COMPARISON:  None in PACs FINDINGS: The bones are subjectively osteopenic. There is mild narrowing of the medial and lateral joint compartments. 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 There is beaking of the tibial spines. Small spurs arise from the articular margins of the patella. There is a tiny suprapatellar effusion. There is no acute fracture or dislocation. IMPRESSION: Osteoarthritic change involving all 3 joint compartments. No acute bony abnormality. Given the patient's symptoms, a popliteal cyst may be  present. Ultrasound would be a useful next imaging step to exclude this entity. Electronically Signed   By: David  [place] M.D.   On: 04/29/2017 08:35   ASSESSMENT & PLAN:   Problem List Items Addressed This Visit    Right leg pain   Primary osteoarthritis of right hip   Relevant Medications   acetaminophen (TYLENOL) 325 MG tablet   Right knee pain - Primary    Patient's right knee is symptomatic first thing in the morning but has essentially no pain throughout the day.  He has marked degenerative changes on his x-ray.  We will begin with conservative management including compression at nighttime since this has been beneficial for him in the past.  We will also check for polyarthralgia conditions including inflammatory arthritis and crystalline arthropathies.  If any evidence of elevated uric acid levels will need to be placed on allopurinol.  We will plan  to call him with these results.  If any persistent or worsening symptoms consideration of injection therapy discussed.       Relevant Orders   DG Knee 1-2 Views Right (Completed)    Other Visit Diagnoses    Primary osteoarthritis of both knees       Relevant Medications   acetaminophen (TYLENOL) 325 MG tablet   Polyarthritis       Relevant Orders   Comprehensive metabolic panel (Completed)   Uric acid (Completed)   Sedimentation rate (Completed)   C-reactive protein (Completed)   Rheumatoid factor   Cyclic citrul peptide antibody, IgG   ANA   CBC with Differential/Platelet (Completed)      Follow-up: Return in about 4 months (around 08/30/2017), or if symptoms worsen or fail to improve.    CMA/ATC served as Neurosurgeonscribe during this visit. History, Physical, and Plan performed by medical provider. Documentation and orders reviewed and attested to.      Gaspar BiddingMichael Layah Skousen, DO    [facility] Physician

## 2017-04-29 NOTE — Assessment & Plan Note (Signed)
Patient's right knee is symptomatic first thing in the morning but has essentially no pain throughout the day.  He has marked degenerative changes on his x-ray.  We will begin with conservative management including compression at nighttime since this has been beneficial for him in the past.  We will also check for polyarthralgia conditions including inflammatory arthritis and crystalline arthropathies.  If any evidence of elevated uric acid levels will need to be placed on allopurinol.  We will plan to call him with these results.  If any persistent or worsening symptoms consideration of injection therapy discussed.

## 2017-04-30 LAB — RHEUMATOID FACTOR: Rhuematoid fact SerPl-aCnc: <14 IU/mL (ref <14)

## 2017-04-30 LAB — ANA: ANA: NEGATIVE

## 2017-04-30 LAB — CYCLIC CITRUL PEPTIDE ANTIBODY, IGG

## 2017-05-13 ENCOUNTER — Encounter: Payer: Self-pay | Admitting: Family Medicine

## 2017-05-13 ENCOUNTER — Ambulatory Visit (INDEPENDENT_AMBULATORY_CARE_PROVIDER_SITE_OTHER): Payer: PPO | Admitting: Family Medicine

## 2017-05-13 ENCOUNTER — Telehealth: Payer: Self-pay | Admitting: Family Medicine

## 2017-05-13 VITALS — BP 124/84 | Temp 98.2°F | Ht 62.0 in | Wt 194.0 lb

## 2017-05-13 DIAGNOSIS — R739 Hyperglycemia, unspecified: Secondary | ICD-10-CM

## 2017-05-13 DIAGNOSIS — Z23 Encounter for immunization: Secondary | ICD-10-CM | POA: Diagnosis not present

## 2017-05-13 DIAGNOSIS — K219 Gastro-esophageal reflux disease without esophagitis: Secondary | ICD-10-CM | POA: Diagnosis not present

## 2017-05-13 DIAGNOSIS — I10 Essential (primary) hypertension: Secondary | ICD-10-CM | POA: Diagnosis not present

## 2017-05-13 DIAGNOSIS — J309 Allergic rhinitis, unspecified: Secondary | ICD-10-CM

## 2017-05-13 DIAGNOSIS — Z13220 Encounter for screening for lipoid disorders: Secondary | ICD-10-CM | POA: Diagnosis not present

## 2017-05-13 DIAGNOSIS — M170 Bilateral primary osteoarthritis of knee: Secondary | ICD-10-CM | POA: Diagnosis not present

## 2017-05-13 LAB — POC URINALSYSI DIPSTICK (AUTOMATED)
Bilirubin, UA: NEGATIVE
Blood, UA: NEGATIVE
Glucose, UA: NEGATIVE
Ketones, UA: NEGATIVE
Leukocytes, UA: NEGATIVE
Nitrite, UA: NEGATIVE
Protein, UA: NEGATIVE
Spec Grav, UA: >=1.03 — AB
Urobilinogen, UA: 0.2 U/dL
pH, UA: 6

## 2017-05-13 LAB — CBC WITH DIFFERENTIAL/PLATELET
BASOS ABS: 0 10*3/uL (ref 0.0–0.1)
Basophils Relative: 0.7 % (ref 0.0–3.0)
EOS ABS: 0.1 10*3/uL (ref 0.0–0.7)
Eosinophils Relative: 1.8 % (ref 0.0–5.0)
HEMATOCRIT: 43 % (ref 39.0–52.0)
HEMOGLOBIN: 14.4 g/dL (ref 13.0–17.0)
LYMPHS PCT: 23.6 % (ref 12.0–46.0)
Lymphs Abs: 1.8 10*3/uL (ref 0.7–4.0)
MCHC: 33.5 g/dL (ref 30.0–36.0)
MCV: 92.4 fl (ref 78.0–100.0)
Monocytes Absolute: 0.7 10*3/uL (ref 0.1–1.0)
Monocytes Relative: 8.9 % (ref 3.0–12.0)
Neutro Abs: 4.8 10*3/uL (ref 1.4–7.7)
Neutrophils Relative %: 65 % (ref 43.0–77.0)
PLATELETS: 260 10*3/uL (ref 150.0–400.0)
RBC: 4.65 Mil/uL (ref 4.22–5.81)
RDW: 13.8 % (ref 11.5–15.5)
WBC: 7.4 10*3/uL (ref 4.0–10.5)

## 2017-05-13 LAB — HEPATIC FUNCTION PANEL
ALT: 9 U/L (ref 0–53)
AST: 14 U/L (ref 0–37)
Albumin: 4 g/dL (ref 3.5–5.2)
Alkaline Phosphatase: 36 U/L — ABNORMAL LOW (ref 39–117)
BILIRUBIN DIRECT: 0.1 mg/dL (ref 0.0–0.3)
BILIRUBIN TOTAL: 0.5 mg/dL (ref 0.2–1.2)
Total Protein: 6.3 g/dL (ref 6.0–8.3)

## 2017-05-13 LAB — BASIC METABOLIC PANEL
BUN: 23 mg/dL (ref 6–23)
CALCIUM: 9.7 mg/dL (ref 8.4–10.5)
CO2: 30 mEq/L (ref 19–32)
CREATININE: 0.92 mg/dL (ref 0.40–1.50)
Chloride: 102 mEq/L (ref 96–112)
GFR: 87.57 mL/min (ref >60.00)
Glucose, Bld: 111 mg/dL — ABNORMAL HIGH (ref 70–99)
Potassium: 4 mEq/L (ref 3.5–5.1)
Sodium: 140 mEq/L (ref 135–145)

## 2017-05-13 LAB — LIPID PANEL
CHOL/HDL RATIO: 5
Cholesterol: 185 mg/dL (ref 0–200)
HDL: 36 mg/dL — ABNORMAL LOW (ref >39.00)
LDL Cholesterol: 116 mg/dL — ABNORMAL HIGH (ref 0–99)
NonHDL: 148.77
TRIGLYCERIDES: 166 mg/dL — AB (ref 0.0–149.0)
VLDL: 33.2 mg/dL (ref 0.0–40.0)

## 2017-05-13 LAB — HEMOGLOBIN A1C: Hgb A1c MFr Bld: 6 % (ref 4.6–6.5)

## 2017-05-13 LAB — TSH: TSH: 1.21 u[IU]/mL (ref 0.35–4.50)

## 2017-05-13 MED ORDER — ATORVASTATIN CALCIUM 10 MG PO TABS: 10.0000 mg | ORAL_TABLET | Freq: Every day | ORAL | 3 refills | Status: DC

## 2017-05-13 NOTE — Patient Instructions (Signed)
It was a pleasure to see you today!  We have ordered labs or studies at this visit. It can take up to 1-2 weeks for results and processing. IF results require follow up or explanation, we will call you with instructions. Clinically stable results will be released to your [facility]. If you have not heard from [place] or cannot find your results in [facility] in 2 weeks please contact our office at [phone].  If you are not yet signed up for [facility], please consider signing up   We recommend low salt diet for blood pressure. See DASH recommendations below:   DASH Eating Plan DASH stands for "Dietary Approaches to Stop Hypertension." The DASH eating plan is a healthy eating plan that has been shown to reduce high blood pressure (hypertension). It may also reduce your risk for type 2 diabetes, heart disease, and stroke. The DASH eating plan may also help with weight loss. What are tips for following this plan? General guidelines  Avoid eating more than 2,300 mg (milligrams) of salt (sodium) a day. If you have hypertension, you may need to reduce your sodium intake to 1,500 mg a day.  Limit alcohol intake to no more than 1 drink a day for nonpregnant women and 2 drinks a day for men. One drink equals 12 oz of beer, 5 oz of wine, or 1 oz of hard liquor.  Work with your health care provider to maintain a healthy body weight or to lose weight. Ask what an ideal weight is for you.  Get at least 30 minutes of exercise that causes your heart to beat faster (aerobic exercise) most days of the week. Activities may include walking, swimming, or biking.  Work with your health care provider or diet and nutrition specialist (dietitian) to adjust your eating plan to your individual calorie needs. Reading food labels  Check food labels for the amount of sodium per serving. Choose foods with less than 5 percent of the Daily Value of sodium. Generally, foods with less than 300 mg of sodium per serving fit into this  eating plan.  To find whole grains, look for the word "whole" as the first word in the ingredient list. Shopping  Buy products labeled as "low-sodium" or "no salt added."  Buy fresh foods. Avoid canned foods and premade or frozen meals. Cooking  Avoid adding salt when cooking. Use salt-free seasonings or herbs instead of table salt or sea salt. Check with your health care provider or pharmacist before using salt substitutes.  Do not fry foods. Cook foods using healthy methods such as baking, boiling, grilling, and broiling instead.  Cook with heart-healthy oils, such as olive, canola, soybean, or sunflower oil. Meal planning   Eat a balanced diet that includes: ? 5 or more servings of fruits and vegetables each day. At each meal, try to fill half of your plate with fruits and vegetables. ? Up to 6-8 servings of whole grains each day. ? Less than 6 oz of lean meat, poultry, or fish each day. A 3-oz serving of meat is about the same size as a deck of cards. One egg equals 1 oz. ? 2 servings of low-fat dairy each day. ? A serving of nuts, seeds, or beans 5 times each week. ? Heart-healthy fats. Healthy fats called Omega-3 fatty acids are found in foods such as flaxseeds and coldwater fish, like sardines, salmon, and mackerel.  Limit how much you eat of the following: ? Canned or prepackaged foods. ? Food that is  high in trans fat, such as fried foods. ? Food that is high in saturated fat, such as fatty meat. ? Sweets, desserts, sugary drinks, and other foods with added sugar. ? Full-fat dairy products.  Do not salt foods before eating.  Try to eat at least 2 vegetarian meals each week.  Eat more home-cooked food and less restaurant, buffet, and fast food.  When eating at a restaurant, ask that your food be prepared with less salt or no salt, if possible. What foods are recommended? The items listed may not be a complete list. Talk with your dietitian about what dietary choices  are best for you. Grains Whole-grain or whole-wheat bread. Whole-grain or whole-wheat pasta. Brown rice. Orpah Cobb. Bulgur. Whole-grain and low-sodium cereals. Pita bread. Low-fat, low-sodium crackers. Whole-wheat flour tortillas. Vegetables Fresh or frozen vegetables (raw, steamed, roasted, or grilled). Low-sodium or reduced-sodium tomato and vegetable juice. Low-sodium or reduced-sodium tomato sauce and tomato paste. Low-sodium or reduced-sodium canned vegetables. Fruits All fresh, dried, or frozen fruit. Canned fruit in natural juice (without added sugar). Meat and other protein foods Skinless chicken or [place]. Ground chicken or [place]. Pork with fat trimmed off. Fish and seafood. Egg whites. Dried beans, peas, or lentils. Unsalted nuts, nut butters, and seeds. Unsalted canned beans. Lean cuts of beef with fat trimmed off. Low-sodium, lean deli meat. Dairy Low-fat (1%) or fat-free (skim) milk. Fat-free, low-fat, or reduced-fat cheeses. Nonfat, low-sodium ricotta or cottage cheese. Low-fat or nonfat yogurt. Low-fat, low-sodium cheese. Fats and oils Soft margarine without trans fats. Vegetable oil. Low-fat, reduced-fat, or light mayonnaise and salad dressings (reduced-sodium). Canola, safflower, olive, soybean, and sunflower oils. Avocado. Seasoning and other foods Herbs. Spices. Seasoning mixes without salt. Unsalted popcorn and pretzels. Fat-free sweets. What foods are not recommended? The items listed may not be a complete list. Talk with your dietitian about what dietary choices are best for you. Grains Baked goods made with fat, such as croissants, muffins, or some breads. Dry pasta or rice meal packs. Vegetables Creamed or fried vegetables. Vegetables in a cheese sauce. Regular canned vegetables (not low-sodium or reduced-sodium). Regular canned tomato sauce and paste (not low-sodium or reduced-sodium). Regular tomato and vegetable juice (not low-sodium or reduced-sodium). Rosita Fire.  Olives. Fruits Canned fruit in a light or heavy syrup. Fried fruit. Fruit in cream or butter sauce. Meat and other protein foods Fatty cuts of meat. Ribs. Fried meat. Tomasa Blase. Sausage. Bologna and other processed lunch meats. Salami. Fatback. Hotdogs. Bratwurst. Salted nuts and seeds. Canned beans with added salt. Canned or smoked fish. Whole eggs or egg yolks. Chicken or [place] with skin. Dairy Whole or 2% milk, cream, and half-and-half. Whole or full-fat cream cheese. Whole-fat or sweetened yogurt. Full-fat cheese. Nondairy creamers. Whipped toppings. Processed cheese and cheese spreads. Fats and oils Butter. Stick margarine. Lard. Shortening. Ghee. Bacon fat. Tropical oils, such as coconut, palm kernel, or palm oil. Seasoning and other foods Salted popcorn and pretzels. Onion salt, garlic salt, seasoned salt, table salt, and sea salt. Worcestershire sauce. Tartar sauce. Barbecue sauce. Teriyaki sauce. Soy sauce, including reduced-sodium. Steak sauce. Canned and packaged gravies. Fish sauce. Oyster sauce. Cocktail sauce. Horseradish that you find on the shelf. Ketchup. Mustard. Meat flavorings and tenderizers. Bouillon cubes. Hot sauce and Tabasco sauce. Premade or packaged marinades. Premade or packaged taco seasonings. Relishes. Regular salad dressings. Where to find more information:  National Heart, Lung, and Blood Institute: PopSteam.is  American Heart Association: www.heart.org Summary  The DASH eating plan is a healthy eating  plan that has been shown to reduce high blood pressure (hypertension). It may also reduce your risk for type 2 diabetes, heart disease, and stroke.  With the DASH eating plan, you should limit salt (sodium) intake to 2,300 mg a day. If you have hypertension, you may need to reduce your sodium intake to 1,500 mg a day.  When on the DASH eating plan, aim to eat more fresh fruits and vegetables, whole grains, lean proteins, low-fat dairy, and heart-healthy  fats.  Work with your health care provider or diet and nutrition specialist (dietitian) to adjust your eating plan to your individual calorie needs. This information is not intended to replace advice given to you by your health care provider. Make sure you discuss any questions you have with your health care provider. Document Released: 10/04/2011 Document Revised: 10/08/2016 Document Reviewed: 10/08/2016 Elsevier Interactive Patient Education  2017 [employer]

## 2017-05-13 NOTE — Telephone Encounter (Signed)
Pt seen in office today to establish care with Raynelle FanningJulie.  Cologuard ordered, signed by Raynelle FanningJulie and faxed.  Received confirmation that the fax was successful.  Sent to scan.

## 2017-05-13 NOTE — Progress Notes (Signed)
Patient ID: Frank [name], male   DOB: 12/20/1950, 66 y.o.   MRN: [id number]  Patient presents to clinic today to establish care and follow up on his chronic conditions.  Chronic Issues: HTN:  Monitors BP intermittently at home and reports BP average of 120s/80s.  He denies chest pain, palpitations, SOB, numbness, tingling, weakness, headaches, or edema. He is adherent to his BP medications and does not add salt to his food. He does report eating fried foods however is motivated to decrease or stop eating fried foods completely. He does not follow an exercise program however denies cardiopulmonary symptoms with ADLs.  GERD: He takes ranitidine for symptoms that provides excellent.  Avoidance of spicy foods that trigger GERD improves symptoms.   He denies significant dyspepsia, hoarseness, dysphagia, or extreme weight loss, melena, or rectal bleeding.   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  Tylenol BID provides excellent benefit for hip pain and he is followed by sports medicine for right knee pain. Pain is noted when first waking up and improves with movement, then returns in the evening. He denies pain at this appointment and has had tylenol this morning.   Allergic rhinitis:  Intermittent symptoms that occur with nasal congestion and post nasal drip.  He denies fever, chills, sweats, N/V/D, ear pressure/pain, sinus pressure/pain. Treatment with OTC antihistamine intermittently provides benefit. He believes this may be Allegra.  Elevated Blood Sugar:  History of elevated blood sugar that was noted in 2017. He monitors weekly at home and reports blood sugar average fasting has been in the 90s.  He denies polyuria, polyphagia, and polyuria.  He has made dietary changes (stopped soda drinking) since starting blood sugar and history of elevated blood sugar and has not had a fasting blood sugar >120 per patient.   The 10-year ASCVD risk score Denman George DC Montez Hageman., et al., 2013) is: 16%   Values used to  calculate the score:     Age: 19 years     Sex: Male     Is Non-Hispanic African American: No     Diabetic: No     Tobacco smoker: No     Systolic Blood Pressure: 124 mmHg     Is BP treated: Yes     HDL Cholesterol: 36 mg/dL     Total Cholesterol: 185 mg/dL   Health Maintenance: Dental --[facility] twice yearly; no problems   Vision -- Yearly; UTD Immunizations -- Influenza recommended; he will consider this in the fall; will consider Antimony Rx; Needs Prevnar today Colonoscopy --Declines colonoscopy due to cost; no family history of colon cancer polyps ; will consider Cologard   Past Medical History:  Diagnosis Date  . Allergy   . Asthma   . GERD (gastroesophageal reflux disease)   . Headache(784.0)   . Hypertension     Past Surgical History:  Procedure Laterality Date  . HERNIA REPAIR  1969, 2009  . kidney stones  05/20/95  . KNEE SURGERY  02/1989  . NASAL SINUS SURGERY  10/24/1992  . ROTATOR CUFF REPAIR  04/25/93    Current Outpatient Prescriptions on File Prior to Visit  Medication Sig Dispense Refill  . acetaminophen (TYLENOL) 325 MG tablet Take 325 mg by mouth.    Marland Kitchen aspirin 81 MG tablet Take 81 mg by mouth daily.      Marland Kitchen lisinopril-hydrochlorothiazide (PRINZIDE,ZESTORETIC) 20-25 MG tablet TAKE 1 TABLET BY MOUTH EVERY DAY 100 tablet 2  . ranitidine (ZANTAC) 150 MG tablet Take 150 mg by mouth 2 (  two) times daily.      . predniSONE (DELTASONE) 20 MG tablet Take 1 tablet (20 mg total) by mouth daily as needed. (Patient not taking: Reported on 05/13/2017) 5 tablet 0   No current facility-administered medications on file prior to visit.     No Known Allergies  Family History  Problem Relation Age of Onset  . Kidney disease Mother   . COPD Father   . Pneumonia Father   . Cardiomyopathy Brother   . Hypertension Sister   . Heart disease Brother   . Hypertension Brother   . Hypertension Brother   . Hypertension Sister   . Diabetes Sister   . Osteoarthritis Sister      Social History   Social History  . Marital status: Married    Spouse name: N/A  . Number of children: N/A  . Years of education: N/A   Occupational History  . Not on file.   Social History Main Topics  . Smoking status: Former Games developer  . Smokeless tobacco: Never Used     Comment: quit in 1972  . Alcohol use No  . Drug use: No  . Sexual activity: Not on file   Other Topics Concern  . Not on file   Social History Narrative  . No narrative on file    ROS  BP 124/84 (BP Location: Left Arm, Patient Position: Sitting, Cuff Size: Large)   Temp 98.2 F (36.8 C) (Oral)   Ht 5\' 2"  (1.575 m)   Wt 194 lb (88 kg)   BMI 35.48 kg/m   Constitutional: No fever, chills, significant weight change, fatigue, weakness or night sweats Eyes: No redness, discharge, pain, blurred vision, double vision, or loss of vision ENT/mouth: No nasal congestion, postnasal drainage,epistaxis, purulent discharge, earache, hearing loss, tinnitus ,sore throat , dental pain, or hoarseness   Cardiovascular: no chest pain, palpitations, racing, irregular rhythm, syncope, nausea, sweating, claudication, or edema  Respiratory: No cough, sputum production,hemoptysis,  dyspnea, paroxysmal nocturnal dyspnea, pleuritic chest pain, significant snoring, or  apnea    Gastrointestinal: No heartburn,dysphagia, nausea and vomiting,ominal pain, change in bowels, anorexia, diarrhea, significant constipation, rectal bleeding, melena,  stool incontinence or jaundice Genitourinary: No dysuria,hematuria, pyuria, frequency, urgency,  incontinence, nocturia, dark urine or flank pain Musculoskeletal: No myalgias or muscle cramping, weakness, or cyanosis. Right hip and right knee pain present, no joint color changes Dermatologic: No rash, pruritus, urticaria, or change in color or temperature of skin Neurologic: No headache, vertigo, limb weakness, tremor, gait disturbance, seizures, memory loss, numbness or tingling Psychiatric:  No significant anxiety or depression, anhedonia, panic attacks, insomnia, or anorexia Endocrine: No change in hair/skin/ nails, excessive thirst, excessive hunger, excessive urination, or unexplained fatigue Hematologic/lymphatic: No bruising, lymphadenopathy,or  abnormal clotting Allergy/immunology: No itchy/ watery eyes, abnormal sneezing, rhinitis, urticaria ,or angioedema  Past Medical History:  Diagnosis Date  . Allergy   . Asthma   . GERD (gastroesophageal reflux disease)   . Headache(784.0)   . Hypertension      Social History   Social History  . Marital status: Married    Spouse name: N/A  . Number of children: N/A  . Years of education: N/A   Occupational History  . Not on file.   Social History Main Topics  . Smoking status: Former Games developer  . Smokeless tobacco: Never Used     Comment: quit in 1972  . Alcohol use No  . Drug use: No  . Sexual activity: Not on file   Other  Topics Concern  . Not on file   Social History Narrative  . No narrative on file    Past Surgical History:  Procedure Laterality Date  . HERNIA REPAIR  1969, 2009  . kidney stones  05/20/95  . KNEE SURGERY  02/1989  . NASAL SINUS SURGERY  10/24/1992  . ROTATOR CUFF REPAIR  04/25/93    Family History  Problem Relation Age of Onset  . Kidney disease Mother   . COPD Father   . Pneumonia Father   . Cardiomyopathy Brother   . Hypertension Sister   . Heart disease Brother   . Hypertension Brother   . Hypertension Brother   . Hypertension Sister   . Diabetes Sister   . Osteoarthritis Sister     No Known Allergies  Current Outpatient Prescriptions on File Prior to Visit  Medication Sig Dispense Refill  . acetaminophen (TYLENOL) 325 MG tablet Take 325 mg by mouth.    Marland Kitchen. aspirin 81 MG tablet Take 81 mg by mouth daily.      Marland Kitchen. lisinopril-hydrochlorothiazide (PRINZIDE,ZESTORETIC) 20-25 MG tablet TAKE 1 TABLET BY MOUTH EVERY DAY 100 tablet 2  . ranitidine (ZANTAC) 150 MG tablet Take 150 mg  by mouth 2 (two) times daily.      . predniSONE (DELTASONE) 20 MG tablet Take 1 tablet (20 mg total) by mouth daily as needed. (Patient not taking: Reported on 05/13/2017) 5 tablet 0   No current facility-administered medications on file prior to visit.     BP 124/84 (BP Location: Left Arm, Patient Position: Sitting, Cuff Size: Large)   Temp 98.2 F (36.8 C) (Oral)   Ht 5\' 2"  (1.575 m)   Wt 194 lb (88 kg)   BMI 35.48 kg/m     Physical Exam  Physical Exam  Constitutional: He is oriented to person, place, and time. He appears well-developed and well-nourished. No distress.  HENT:  Head: Normocephalic and atraumatic.  Right Ear: Tympanic membrane and ear canal normal.  Left Ear: Tympanic membrane and ear canal normal.  Mouth/Throat: Oropharynx is clear and moist.  Eyes: Pupils are equal, round, and reactive to light. No scleral icterus.  Neck: Normal range of motion. No thyromegaly present.  Cardiovascular: Normal rate and regular rhythm.   No murmur heard. Pulmonary/Chest: Effort normal and breath sounds normal. No respiratory distress. He has no wheezes. He has no rales. He exhibits no tenderness.  Abdominal: Soft. Bowel sounds are normal. He exhibits no distension and no mass. There is no tenderness. There is no rebound and no guarding.  Musculoskeletal: He exhibits no edema. Mild crepitus in knees bilaterally Lymphadenopathy:    He has no cervical adenopathy.  Neurological: He is alert and oriented to person, place, and time. He has normal reflexes. He exhibits normal muscle tone. Coordination normal.  Skin: Skin is warm and dry.  Psychiatric: He has a normal mood and affect. His behavior is normal. Judgment and thought content normal.    Assessment/Plan:  1. Hypertension, essential Controlled; no medication changes at this time  2. Primary osteoarthritis of both knees Continue Tylenol BID and follow up with Dr. Berline Choughigby as directed. He also may consider using tumeric for  discomfort. He has a scheduled follow up with Dr. Berline Choughigby  3. Gastroesophageal reflux disease, esophagitis presence not specified Controlled; advised avoidance of food triggers to control symptoms with a goal of reducing ranitidine use.  4. Screening for cholesterol level   5. Elevated blood sugar Stable; continue weekly monitoring and dietary changes.  Will check A1C today and make changes if needed.   6. Allergic rhinitis, unspecified seasonality, unspecified trigger Stable; allegra as needed.   Written information provided for [facility] and also cologuard for patient to consider.  Follow up for Welcome to Medicare Visit in the next 6 months or sooner if needed.  Roddie Mc, FNP-C

## 2017-05-14 ENCOUNTER — Other Ambulatory Visit: Payer: Self-pay | Admitting: *Deleted

## 2017-05-14 DIAGNOSIS — E7800 Pure hypercholesterolemia, unspecified: Secondary | ICD-10-CM

## 2017-05-14 MED ORDER — ATORVASTATIN CALCIUM 10 MG PO TABS: 10.0000 mg | ORAL_TABLET | Freq: Every day | ORAL | 3 refills | Status: DC

## 2017-05-16 ENCOUNTER — Inpatient Hospital Stay: Admission: RE | Admit: 2017-05-16 | Payer: PPO | Source: Ambulatory Visit | Admitting: Internal Medicine

## 2017-05-17 LAB — CBC WITH DIFFERENTIAL/PLATELET
BASOS PCT: 0 %
Basophils Absolute: 0 10*3/uL (ref 0.0–0.1)
EOS ABS: 0.3 10*3/uL (ref 0.0–0.7)
EOS PCT: 2 %
HCT: 25.6 % — ABNORMAL LOW (ref 39.0–52.0)
HEMOGLOBIN: 8.4 g/dL — AB (ref 13.0–17.0)
Lymphocytes Relative: 8 %
Lymphs Abs: 1.3 10*3/uL (ref 0.7–4.0)
MCH: 31.3 pg (ref 26.0–34.0)
MCHC: 32.8 g/dL (ref 30.0–36.0)
MCV: 95.5 fL (ref 78.0–100.0)
MONO ABS: 0.8 10*3/uL (ref 0.1–1.0)
Monocytes Relative: 5 %
NEUTROS ABS: 14 10*3/uL — AB (ref 1.7–7.7)
NEUTROS PCT: 85 %
PLATELETS: 265 10*3/uL (ref 150–400)
RBC: 2.68 MIL/uL — ABNORMAL LOW (ref 4.22–5.81)
RDW: 17.1 % — ABNORMAL HIGH (ref 11.5–15.5)
WBC: 16.4 10*3/uL — ABNORMAL HIGH (ref 4.0–10.5)

## 2017-06-03 ENCOUNTER — Telehealth: Payer: Self-pay | Admitting: Family Medicine

## 2017-06-03 NOTE — Telephone Encounter (Signed)
Pt states he is trying to get some things worked out with his insurance company concerning bills related to is hip.  Until then he does not want to do the Colo guard. Pt wants to know what he should do with the box he received.  Pt wants to make sure they will not bill him. Pt has not opened the box.

## 2017-06-04 NOTE — Telephone Encounter (Signed)
Please Advise

## 2017-06-05 NOTE — Telephone Encounter (Signed)
Please see information that is located in the office. There may be contact information there that can be provided to patient or this may be an insurance question that he may need to contact his specific insurance company.

## 2017-06-05 NOTE — Telephone Encounter (Signed)
Spoke to patient, explained that he needs to contact the colo guard company and see if its ok to ship it back to them since its not been opened, Pt was advised to call his insurance for any billing questions.

## 2017-07-01 ENCOUNTER — Encounter: Payer: Self-pay | Admitting: Family Medicine

## 2017-07-01 DIAGNOSIS — Z1212 Encounter for screening for malignant neoplasm of rectum: Secondary | ICD-10-CM | POA: Diagnosis not present

## 2017-07-01 DIAGNOSIS — Z1211 Encounter for screening for malignant neoplasm of colon: Secondary | ICD-10-CM | POA: Diagnosis not present

## 2017-07-04 ENCOUNTER — Other Ambulatory Visit: Payer: Self-pay | Admitting: Family Medicine

## 2017-07-07 LAB — COLOGUARD

## 2017-07-18 ENCOUNTER — Encounter: Payer: Self-pay | Admitting: Family Medicine

## 2017-07-23 ENCOUNTER — Telehealth: Payer: Self-pay | Admitting: Family Medicine

## 2017-07-23 NOTE — Telephone Encounter (Signed)
° ° °  Pt call to say he received his a letter stating that his colorguard results was sent to [place]. He call today to ask for those results.    [phone]

## 2017-07-24 NOTE — Telephone Encounter (Signed)
Spoke with pt explained that we have not received his colorguard results, advised pt we will call him when we get them

## 2017-08-19 ENCOUNTER — Ambulatory Visit: Payer: PPO | Admitting: Family Medicine

## 2017-09-02 ENCOUNTER — Telehealth: Payer: Self-pay | Admitting: Family Medicine

## 2017-09-02 ENCOUNTER — Encounter: Payer: Self-pay | Admitting: Sports Medicine

## 2017-09-02 ENCOUNTER — Ambulatory Visit: Payer: PPO | Admitting: Sports Medicine

## 2017-09-02 VITALS — BP 142/92 | HR 71 | Ht 62.0 in | Wt 187.2 lb

## 2017-09-02 DIAGNOSIS — M170 Bilateral primary osteoarthritis of knee: Secondary | ICD-10-CM | POA: Insufficient documentation

## 2017-09-02 DIAGNOSIS — M1611 Unilateral primary osteoarthritis, right hip: Secondary | ICD-10-CM

## 2017-09-02 NOTE — Assessment & Plan Note (Signed)
He is doing better after last office visit from a polyarthralgia complaint.  No evidence of systemic involvement.>50% of this 25 minute visit spent in direct patient counseling and/or coordination of care.  Discussion was focused on education regarding the in discussing the pathoetiology and anticipated clinical course of the above condition.

## 2017-09-02 NOTE — Patient Instructions (Signed)
Establish with Dr. Durene CalHunter or Dr. Jimmey RalphParker.  Either are excellent Doctors!

## 2017-09-02 NOTE — Assessment & Plan Note (Signed)
Overall doing well. S/p injection with overall good improvement and currently only minimally symptomatic  Can consider repeat injection

## 2017-09-02 NOTE — Progress Notes (Signed)
OFFICE VISIT NOTE Frank [name]. [name] at [facility] [address] [phone]  Frank [name] - 66 y.o. male MRN [id number]  Date of birth: 08-30-1951  Visit Date: 09/02/2017  PCP: Roddie Mc, FNP   Referred by: Roderick Pee, MD  Stevenson Clinch, CMA acting as scribe for Dr. Berline Chough.  SUBJECTIVE:   Chief Complaint  Patient presents with  . Follow-up    RT knee pain   HPI: As below and per problem based documentation when appropriate.  Frank [name] is an established patient presenting today in follow-up of RT knee pain. He was last seen 04/29/2017 and had xray done. He had labs drawn to check for immune related arthritis and gout, labs came back normal. He was advised to wear compression sleeve at night.   He feels that his knee pain has improved overall. He wears his knee brace at nighttime intermittently. He denies swelling around the knee.     Review of Systems  Constitutional: Negative for chills, fever and malaise/fatigue.  Respiratory: Negative for shortness of breath and wheezing.   Cardiovascular: Negative for chest pain, palpitations and leg swelling.  Neurological: Negative for dizziness, tingling, weakness and headaches.    Otherwise per HPI.  HISTORY & PERTINENT PRIOR DATA:  No specialty comments available. He reports that he has quit smoking. he has never used smokeless tobacco.  Recent Labs    04/29/17 0845 05/13/17 1017  HGBA1C  --  6.0  LABURIC 6.0  --    Allergies reviewed per EMR Prior to Admission medications   Medication Sig Start Date End Date Taking? Authorizing Provider  acetaminophen (TYLENOL) 325 MG tablet Take 325 mg by mouth.   Yes [provider]  aspirin 81 MG tablet Take 81 mg by mouth daily.     Yes [provider]  lisinopril-hydrochlorothiazide (PRINZIDE,ZESTORETIC) 20-25 MG tablet TAKE 1 TABLET BY MOUTH EVERY DAY Patient taking differently: TAKE 1/2 TABLET BY MOUTH EVERY DAY  07/04/17  Yes Nelwyn Salisbury, MD  ranitidine (ZANTAC) 150 MG tablet Take 150 mg at bedtime by mouth.    Yes [provider]   Patient Active Problem List   Diagnosis Date Noted  . Primary osteoarthritis of both knees 09/02/2017  . Right knee pain 04/29/2017  . Neck pain 02/15/2017  . Primary osteoarthritis of right hip 02/15/2017  . Tear of right acetabular labrum 02/15/2017  . Right leg pain 01/04/2017  . Acute prostatitis with hematuria 09/28/2015  . Obese 08/17/2015  . Elevated blood sugar 08/17/2015  . Balanitis 08/06/2015  . Shingles 09/10/2014  . Dyspnea 11/27/2011  . HERNIA, UMBILICAL 08/02/2008  . Essential hypertension 01/30/2008  . Allergic rhinitis 01/30/2008  . GERD 01/30/2008  . HEADACHE 01/30/2008   Past Medical History:  Diagnosis Date  . Allergy   . Asthma   . GERD (gastroesophageal reflux disease)   . Headache(784.0)   . Hypertension    Family History  Problem Relation Age of Onset  . Kidney disease Mother   . COPD Father   . Pneumonia Father   . Cardiomyopathy Brother   . Hypertension Sister   . Heart disease Brother   . Hypertension Brother   . Hypertension Brother   . Hypertension Sister   . Diabetes Sister   . Osteoarthritis Sister    Past Surgical History:  Procedure Laterality Date  . HERNIA REPAIR  1969, 2009  . kidney stones  05/20/95  . KNEE SURGERY  02/1989  .  NASAL SINUS SURGERY  10/24/1992  . ROTATOR CUFF REPAIR  04/25/93   Social History   Occupational History  . Not on file  Tobacco Use  . Smoking status: Former Games developermoker  . Smokeless tobacco: Never Used  . Tobacco comment: quit in 1972  Substance and Sexual Activity  . Alcohol use: No  . Drug use: No  . Sexual activity: Not on file    OBJECTIVE:  VS:  HT:5\' 2"  (157.5 cm)   WT:187 lb 3.2 oz (84.9 kg)  BMI:34.23    BP:(!) 142/92  HR:71bpm  TEMP: ( )  RESP:96 % EXAM: Findings:  Adult male.  No acute distress.  Alert and appropriate. Walks with a normal gait.   He is able to pull his hip to his chest without significant pain. Full flexion extension of the knees.    RADIOLOGY: DG Knee 1-2 Views Right CLINICAL DATA:  Posterior right knee pain radiating into the calf for the past 2 weeks with no known injury. Symptoms improved after arising from bed and being up and moving around.  EXAM: RIGHT KNEE - 1-2 VIEW  COMPARISON:  None in PACs  FINDINGS: The bones are subjectively osteopenic. There is mild narrowing of the medial and lateral joint compartments. 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 There is beaking of the tibial spines. Small spurs arise from the articular margins of the patella. There is a tiny suprapatellar effusion. There is no acute fracture or dislocation.  IMPRESSION: Osteoarthritic change involving all 3 joint compartments. No acute bony abnormality. Given the patient's symptoms, a popliteal cyst may be present. Ultrasound would be a useful next imaging step to exclude this entity.  Electronically Signed   By: David  SwazilandJordan M.D.   On: 04/29/2017 08:35  ASSESSMENT & PLAN:     ICD-10-CM   1. Primary osteoarthritis of both knees M17.0   2. Primary osteoarthritis of right hip M16.11    ================================================================= Primary osteoarthritis of right hip Overall doing well. S/p injection with overall good improvement and currently only minimally symptomatic  Can consider repeat injection  Primary osteoarthritis of both knees He is doing better after last office visit from a polyarthralgia complaint.  No evidence of systemic involvement.>50% of this 25 minute visit spent in direct patient counseling and/or coordination of care.  Discussion was focused on education regarding the in discussing the pathoetiology and anticipated clinical course of the above condition.  No notes on file ================================================================= Patient Instructions  Establish  with Dr. Durene CalHunter or Dr. Jimmey RalphParker.  Either are excellent Doctors!  ================================================================= No future appointments.  Follow-up: Return if symptoms worsen or fail to improve.   CMA/ATC served as Neurosurgeonscribe during this visit. History, Physical, and Plan performed by medical provider. Documentation and orders reviewed and attested to.      Gaspar BiddingMichael Allora Bains, DO    [facility]

## 2017-09-05 NOTE — Telephone Encounter (Signed)
Patient would like to establish care with Dr. Durene CalHunter. Patient was advised that Dr. Erasmo LeventhalHunter's schedule is quite full, and that we would leave a message to see if they could establish with him. Please advise.

## 2017-09-23 ENCOUNTER — Encounter: Payer: Self-pay | Admitting: Family Medicine

## 2017-09-23 ENCOUNTER — Ambulatory Visit: Payer: PPO | Admitting: Family Medicine

## 2017-09-23 VITALS — BP 147/85 | HR 64 | Ht 62.0 in | Wt 186.8 lb

## 2017-09-23 DIAGNOSIS — Z23 Encounter for immunization: Secondary | ICD-10-CM

## 2017-09-23 DIAGNOSIS — N481 Balanitis: Secondary | ICD-10-CM

## 2017-09-23 DIAGNOSIS — K219 Gastro-esophageal reflux disease without esophagitis: Secondary | ICD-10-CM

## 2017-09-23 DIAGNOSIS — E785 Hyperlipidemia, unspecified: Secondary | ICD-10-CM | POA: Insufficient documentation

## 2017-09-23 DIAGNOSIS — I10 Essential (primary) hypertension: Secondary | ICD-10-CM

## 2017-09-23 MED ORDER — NYSTATIN 100000 UNIT/GM EX OINT: 1.0000 "application " | TOPICAL_OINTMENT | Freq: Two times a day (BID) | CUTANEOUS | 0 refills | Status: DC

## 2017-09-23 NOTE — Assessment & Plan Note (Signed)
Discussed treatment options with patient-he deferred starting any statin therapy at this time.  He will return in 2-3 months to recheck his lipid panel.  Consider starting simvastatin, or every other day dosing of atorvastatin if continues to have elevated cholesterol.

## 2017-09-23 NOTE — Assessment & Plan Note (Signed)
Stable.  Continue ranitidine 150 mg daily. 

## 2017-09-23 NOTE — Assessment & Plan Note (Signed)
Slightly above goal today.  Typically well controlled per his home blood pressure readings.  Continue lisinopril-HCTZ today.  Advised patient continue checking at home and let us know if persistently elevated above 140/90.

## 2017-09-23 NOTE — Assessment & Plan Note (Signed)
No current outbreak.  States that he occasionally gets this and was prescribed a "cream."  Nystatin was sent in.

## 2017-09-23 NOTE — Progress Notes (Signed)
    Subjective:  Frank [name] is a 66 y.o. male who presents today with a chief complaint of hypertension and to transfer care from [place].   HPI:  Hypertension, Chronic Problem, Stable BP Readings from Last 3 Encounters:  09/23/17 (!) 147/85  09/02/17 (!) 142/92  05/13/17 124/84   Home BP monitoring: Yes, Ranges: 130s/70s Current Medications: lisinopril-HCTZ 20-15 half tablet daily, compliant without side effects.  ROS: Denies any chest pain, shortness of breath, dyspnea on exertion, leg edema.   GERD, Chronic Problem Currently on ranitidine 150 mg daily.  This works well for him.  He is also made several dietary modifications including decreasing the amount of spicy foods that he eats.  HLD, chronic problem Patient with fasting lipid panel 4 months ago and noted to have dyslipidemia.  He was started on atorvastatin 10 mg daily, however was only able to take this for a couple weeks before noticing severe joint pain and myalgias.  He has not taking anything currently.  He is taking a home remedy consisting of white vinegar which he thinks is helping with his cholesterol levels.  ROS: Per HPI  PMH: Smoking history reviewed.  Former smoker.  Objective:  Physical Exam: BP (!) 147/85 (BP Location: Left Arm, Cuff Size: Normal)   Pulse 64   Ht 5\' 2"  (1.575 m)   Wt 186 lb 12.5 oz (84.7 kg)   SpO2 97%   BMI 34.16 kg/m   Gen: NAD, resting comfortably CV: RRR with no murmurs appreciated Pulm: NWOB, CTAB with no crackles, wheezes, or rhonchi MSK: No edema, cyanosis, or clubbing noted Skin: Warm, dry Neuro: Grossly normal, moves all extremities Psych: Normal affect and thought content  Assessment/Plan:  Essential hypertension Slightly above goal today.  Typically well controlled per his home blood pressure readings.  Continue lisinopril-HCTZ today.  Advised patient continue checking at home and let us know if persistently elevated above 140/90.  GERD Stable.  Continue  ranitidine 150 mg daily.  Dyslipidemia Discussed treatment options with patient-he deferred starting any statin therapy at this time.  He will return in 2-3 months to recheck his lipid panel.  Consider starting simvastatin, or every other day dosing of atorvastatin if continues to have elevated cholesterol.  Balanitis No current outbreak.  States that he occasionally gets this and was prescribed a "cream."  Nystatin was sent in.  Preventative healthcare Flu shot given today.  Katina Degreealeb M. Jimmey RalphParker, MD 09/23/2017 10:18 AM

## 2017-09-24 ENCOUNTER — Ambulatory Visit: Payer: PPO | Admitting: Family Medicine

## 2017-12-02 ENCOUNTER — Other Ambulatory Visit: Payer: PPO

## 2017-12-28 ENCOUNTER — Other Ambulatory Visit: Payer: Self-pay | Admitting: Family Medicine

## 2018-01-22 ENCOUNTER — Ambulatory Visit (INDEPENDENT_AMBULATORY_CARE_PROVIDER_SITE_OTHER): Payer: PPO | Admitting: Sports Medicine

## 2018-01-22 ENCOUNTER — Encounter: Payer: Self-pay | Admitting: Sports Medicine

## 2018-01-22 ENCOUNTER — Ambulatory Visit: Payer: Self-pay

## 2018-01-22 VITALS — BP 146/88 | HR 70 | Ht 62.0 in | Wt 194.8 lb

## 2018-01-22 DIAGNOSIS — M25551 Pain in right hip: Secondary | ICD-10-CM | POA: Diagnosis not present

## 2018-01-22 DIAGNOSIS — M170 Bilateral primary osteoarthritis of knee: Secondary | ICD-10-CM | POA: Diagnosis not present

## 2018-01-22 DIAGNOSIS — M1611 Unilateral primary osteoarthritis, right hip: Secondary | ICD-10-CM

## 2018-01-22 DIAGNOSIS — M65341 Trigger finger, right ring finger: Secondary | ICD-10-CM

## 2018-01-22 MED ORDER — DICLOFENAC SODIUM 1 % TD GEL: TRANSDERMAL | 1 refills | Status: DC

## 2018-01-22 NOTE — Procedures (Signed)
PROCEDURE NOTE:  Ultrasound Guided: Injection: Right hip Images were obtained and interpreted by myself, Gaspar BiddingMichael Azad Calame, DO  Images have been saved and stored to PACS system. Images obtained on: GE S7 Ultrasound machine    ULTRASOUND FINDINGS:  Osteophytic spurring.  Small joint effusion.  DESCRIPTION OF PROCEDURE:  The patient's clinical condition is marked by substantial pain and/or significant functional disability. Other conservative therapy has not provided relief, is contraindicated, or not appropriate. There is a reasonable likelihood that injection will significantly improve the patient's pain and/or functional impairment.   After discussing the risks, benefits and expected outcomes of the injection and all questions were reviewed and answered, the patient wished to undergo the above named procedure.  Verbal consent was obtained.  The ultrasound was used to identify the target structure and adjacent neurovascular structures. The skin was then prepped in sterile fashion and the target structure was injected under direct visualization using sterile technique as below:  PREP: Alcohol, Ethel Chloride APPROACH: direct, stopcock technique, 22g 3.5in.  INJECTATE: 5cc: 1% lidocaine, 2cc: 0.5% marcaine, 2cc: 40mg /mL DepoMedrol   ASPIRATE: None   DRESSING: Band-Aid   Post procedural instructions including recommending icing and warning signs for infection were reviewed.    This procedure was well tolerated and there were no complications.   IMPRESSION: Succesful Ultrasound Guided: Injection

## 2018-01-22 NOTE — Patient Instructions (Signed)

## 2018-01-22 NOTE — Progress Notes (Signed)
Veverly FellsMichael D. Delorise Shinerigby, DO  [place] [facility] [phone]  Frank ElseCharles E [name] - 67 y.o. male MRN [id number]  Date of birth: 07/05/1951  Visit Date: 01/22/2018  PCP: Ardith DarkParker, Caleb M, MD   Referred by: Ardith DarkParker, Caleb M, MD  Scribe for today's visit: Christoper FabianMolly Weber, LAT, ATC     SUBJECTIVE:  Frank [name] is here for Follow-up (hip pain) .   Notes from OV on 09/02/17: Frank [name] is an established patient presenting today in follow-up of RT knee pain. He was last seen 04/29/2017 and had xray done. He had labs drawn to check for immune related arthritis and gout, labs came back normal. He was advised to wear compression sleeve at night.   He feels that his knee pain has improved overall. He wears his knee brace at nighttime intermittently. He denies swelling around the knee.   01/22/18: Compared to the last office visit on 09/02/17, his previously described knee pain symptoms show no change.  He states that his biggest c/o is his R hip that flared up over the past 3-4 days.  He reports R groin pain w/ no radiation to his R thigh.  He is also c/o of some R 4th finger MCP pain but notes that he may have to come back on another day if there's not enough time to discuss that today because [place] has to [place] in [place] around 1 pm today. Current symptoms are mild & are nonradiating.  He notes increased symptoms when he actively flexes his hip and attempts to cross his R leg over the left. He has been taking Tylenol and Aleve prn.  ROS Denies night time disturbances. Denies fevers, chills, or night sweats. Denies unexplained weight loss. Denies personal history of cancer. Denies changes in bowel or bladder habits. Denies recent unreported falls. Reports new or worsening dyspnea or wheezing. Denies headaches or dizziness.  Denies numbness, tingling or weakness  In the extremities.  Denies dizziness or presyncopal episodes Denies lower extremity edema    HISTORY  & PERTINENT PRIOR DATA:  Prior History reviewed and updated per electronic medical record.  Significant/pertinent history, findings, studies include:  reports that he has quit smoking. He has never used smokeless tobacco. Recent Labs    04/29/17 0845 05/13/17 1017  HGBA1C  --  6.0  LABURIC 6.0  --    No specialty comments available. Problem  Trigger Ring Finger of Right Hand    OBJECTIVE:  VS:  HT:5\' 2"  (157.5 cm)   WT:194 lb 12.8 oz (88.4 kg)  BMI:35.62    BP:(Abnormal) 146/88  HR:70bpm  TEMP: ( )  RESP:97 %   PHYSICAL EXAM: Constitutional: WDWN, Non-toxic appearing. Psychiatric: Alert & appropriately interactive.  Not depressed or anxious appearing. Respiratory: No increased work of breathing.  Trachea Midline Eyes: Pupils are equal.  EOM intact without nystagmus.  No scleral icterus  Vascular Exam: warm to touch no edema  lower extremity neuro exam: unremarkable normal strength normal sensation  MSK Exam: + Moderate pain with internal and external rotation of the right hip.  Somewhat limited internal rotation compared to the right.  Small amount of tenderness across the medial and lateral joint lines bilateral knees mild.  No appreciable today with generalized synovitis is present.  Right hand with triggering of the fourth finger with terminal flexion however only minimal pain with triggering it does almost completely lock.   ASSESSMENT & PLAN:   1. Primary osteoarthritis of right  hip   2. Right hip pain   3. Primary osteoarthritis of both knees   4. Trigger ring finger of right hand     PLAN: Injection of the right hip today with Voltaren gel prescription for right hand and bilateral knees.  Follow-up: Return if symptoms worsen or fail to improve, for follow-up right hip as needed.     Please see additional documentation for Objective, Assessment and Plan sections. Pertinent additional documentation may be included in corresponding procedure notes,  imaging studies, problem based documentation and patient instructions. Please see these sections of the encounter for additional information regarding this visit.  CMA/ATC served as Neurosurgeon during this visit. History, Physical, and Plan performed by medical provider. Documentation and orders reviewed and attested to.      Andrena Mews, DO    [facility] Physician

## 2018-07-02 ENCOUNTER — Other Ambulatory Visit: Payer: Self-pay | Admitting: Family Medicine

## 2018-08-14 ENCOUNTER — Encounter: Payer: Self-pay | Admitting: Sports Medicine

## 2018-08-14 ENCOUNTER — Ambulatory Visit (INDEPENDENT_AMBULATORY_CARE_PROVIDER_SITE_OTHER): Payer: PPO | Admitting: Sports Medicine

## 2018-08-14 ENCOUNTER — Ambulatory Visit: Payer: Self-pay

## 2018-08-14 VITALS — BP 130/86 | HR 66 | Ht 62.0 in | Wt 195.8 lb

## 2018-08-14 DIAGNOSIS — M25551 Pain in right hip: Secondary | ICD-10-CM | POA: Diagnosis not present

## 2018-08-14 DIAGNOSIS — M1611 Unilateral primary osteoarthritis, right hip: Secondary | ICD-10-CM

## 2018-08-14 DIAGNOSIS — S73191D Other sprain of right hip, subsequent encounter: Secondary | ICD-10-CM | POA: Diagnosis not present

## 2018-08-14 DIAGNOSIS — M170 Bilateral primary osteoarthritis of knee: Secondary | ICD-10-CM | POA: Diagnosis not present

## 2018-08-14 MED ORDER — DICLOFENAC SODIUM 1 % TD GEL: TRANSDERMAL | 1 refills | Status: DC

## 2018-08-14 NOTE — Procedures (Signed)
PROCEDURE NOTE:  Ultrasound Guided: Injection: Right hip Images were obtained and interpreted by myself, Gaspar Bidding, DO  Images have been saved and stored to PACS system. Images obtained on: GE S7 Ultrasound machine    ULTRASOUND FINDINGS:  Minimal effusion.  Degenerative labral tear.  DESCRIPTION OF PROCEDURE:  The patient's clinical condition is marked by substantial pain and/or significant functional disability. Other conservative therapy has not provided relief, is contraindicated, or not appropriate. There is a reasonable likelihood that injection will significantly improve the patient's pain and/or functional impairment.   After discussing the risks, benefits and expected outcomes of the injection and all questions were reviewed and answered, the patient wished to undergo the above named procedure.  Verbal consent was obtained.  The ultrasound was used to identify the target structure and adjacent neurovascular structures. The skin was then prepped in sterile fashion and the target structure was injected under direct visualization using sterile technique as below:  Single injection performed as below: PREP: Alcohol and Ethel Chloride APPROACH:direct, stopcock technique, 22g 3.5 in. INJECTATE: 5 cc 1% lidocaine, 2 cc 0.5% Marcaine and 1 cc 40mg /mL DepoMedrol ASPIRATE: None DRESSING: Band-Aid  Post procedural instructions including recommending icing and warning signs for infection were reviewed.    This procedure was well tolerated and there were no complications.   IMPRESSION: Succesful Ultrasound Guided: Injection     ++++++++++++++++++++++++++++++++++++++++++++++++  PROCEDURE NOTE:  Ultrasound Guided: Injection: Left knee Images were obtained and interpreted by myself, Gaspar Bidding, DO  Images have been saved and stored to PACS system. Images obtained on: GE S7 Ultrasound machine    ULTRASOUND FINDINGS:  Moderate effusion, generalized synovitis.  Questionable starry  night appearance of the fluid  DESCRIPTION OF PROCEDURE:  The patient's clinical condition is marked by substantial pain and/or significant functional disability. Other conservative therapy has not provided relief, is contraindicated, or not appropriate. There is a reasonable likelihood that injection will significantly improve the patient's pain and/or functional impairment.   After discussing the risks, benefits and expected outcomes of the injection and all questions were reviewed and answered, the patient wished to undergo the above named procedure.  Verbal consent was obtained.  The ultrasound was used to identify the target structure and adjacent neurovascular structures. The skin was then prepped in sterile fashion and the target structure was injected under direct visualization using sterile technique as below:  Single injection performed as below: PREP: Alcohol and Ethel Chloride APPROACH:superiolateral, single injection, 21g 2 in. INJECTATE: 2 cc 0.5% Marcaine and 1 cc 40mg /mL DepoMedrol ASPIRATE: None DRESSING: Band-Aid  Post procedural instructions including recommending icing and warning signs for infection were reviewed.    This procedure was well tolerated and there were no complications.   IMPRESSION: Succesful Ultrasound Guided: Injection

## 2018-08-14 NOTE — Patient Instructions (Signed)

## 2018-08-14 NOTE — Progress Notes (Signed)
Frank [name]. [name] at [facility] [phone]  [name] - 67 y.o. male MRN [id number]  Date of birth: 08-Sep-1951  Visit Date: 08/14/2018  PCP: Ardith Dark, MD   Referred by: Ardith Dark, MD  Scribe for today's visit: Stevenson Clinch, CMA     SUBJECTIVE:  Frank [name] is here for Follow-up (L knee, R hip) .     01/22/18: R hip pain Compared to the last office visit on 09/02/17, his previously described knee pain symptoms show no change.  He states that his biggest c/o is his R hip that flared up over the past 3-4 days.  He reports R groin pain w/ no radiation to his R thigh.  He is also c/o of some R 4th finger MCP pain but notes that he may have to come back on another day if there's not enough time to discuss that today because he has to be in [place] around 1 pm today. Current symptoms are mild & are nonradiating.  He notes increased symptoms when he actively flexes his hip and attempts to cross his R leg over the left. He has been taking Tylenol and Aleve prn.  08/14/2018: R hip pain Compared to the last office visit, his previously described symptoms show no change. He has noticed that the pain is now more toward the back of the hip. He reports occasional tightness in the R thigh.  Current symptoms are mild & are radiating to lower back. He reports occasional R groin pain.  He reports no changes to medications for pain or other modalities. He did take 1 left over Prednisone and felt like the pain eased off later in the day.   L knee pain L knee pain started about 1 month ago, has arthritis. He doesn't recall doing anything to flare-up his knee pain.  Pain is mostly just below the patella. Pain is moderate (4/10) throbbing with occasional sharp pain.  Worsened with weightbearing, feels like the knee may give out/buckle on him. Pain seems to come and go but has become more constant recently.  Pain improves with  rest.  He denies swelling around the knee. He reports occasional popping in the knee.  He has tried Tylenol and Aleve with short term relief.   He also reports that his lower legs ache and get very cold at night when he is lying down. Sx improve when he gets up and moves around.   ROS Denies night time disturbances. Denies fevers, chills, or night sweats. Denies unexplained weight loss. Denies personal history of cancer. Denies changes in bowel or bladder habits. Denies recent unreported falls. Reports new or worsening dyspnea or wheezing. Denies headaches or dizziness.  Denies numbness, tingling or weakness in the extremities.  Denies dizziness or presyncopal episodes Denies lower extremity edema    HISTORY & PERTINENT PRIOR DATA:  Prior History reviewed and updated per electronic medical record.  Significant/pertinent history, findings, studies include:  reports that he has quit smoking. He has never used smokeless tobacco. No results for input(s): HGBA1C, LABURIC, CREATINE in the last 8760 hours. No specialty comments available. No problems updated.  OBJECTIVE:  VS:  HT:5\' 2"  (157.5 cm)   WT:195 lb 12.8 oz (88.8 kg)  BMI:35.8    BP:130/86  HR:66bpm  TEMP: ( )  RESP:97 %   PHYSICAL EXAM: Constitutional: WDWN, Non-toxic appearing. Psychiatric: Alert & appropriately interactive.  Not depressed or anxious appearing. Respiratory:  No increased work of breathing.  Trachea Midline Eyes: Pupils are equal.  EOM intact without nystagmus.  No scleral icterus  Vascular Exam: warm to touch no edema  lower extremity neuro exam: unremarkable normal strength normal sensation  MSK Exam: Right hip with mild pain with logroll and failure testing.  He has an antalgic gait.  Negative straight leg raise. Left knee with a small effusion.  Medial and lateral joint line pain.  He has pain with McMurray's but no significant clicking or popping.   ASSESSMENT & PLAN:   1. Primary  osteoarthritis of right hip   2. Primary osteoarthritis of both knees   3. Right hip pain   4. Tear of right acetabular labrum, subsequent encounter   5. Pain in right hip     PLAN: Injection of both the right hip and the left knee performed today.  If any lack of improvement with pain in the right knee we are happy to reinject this at any point he will call to schedule follow-up as needed.  Handicap placard for 5 years provided that this will be a chronic ongoing issue unless he chooses to proceed with surgery which at this time is likely not necessary given the intermittent nature of both of these issues.   Follow-up: Return if symptoms worsen or fail to improve.     Please see additional documentation for Objective, Assessment and Plan sections. Pertinent additional documentation may be included in corresponding procedure notes, imaging studies, problem based documentation and patient instructions. Please see these sections of the encounter for additional information regarding this visit.  CMA/ATC served as Neurosurgeon during this visit. History, Physical, and Plan performed by medical provider. Documentation and orders reviewed and attested to.      Andrena Mews, DO    [facility]

## 2018-09-20 ENCOUNTER — Encounter: Payer: Self-pay | Admitting: Family Medicine

## 2018-09-20 ENCOUNTER — Ambulatory Visit (INDEPENDENT_AMBULATORY_CARE_PROVIDER_SITE_OTHER): Payer: PPO | Admitting: Family Medicine

## 2018-09-20 VITALS — BP 126/82 | HR 69 | Temp 98.3°F | Ht 62.0 in | Wt 197.0 lb

## 2018-09-20 DIAGNOSIS — K1321 Leukoplakia of oral mucosa, including tongue: Secondary | ICD-10-CM

## 2018-09-20 DIAGNOSIS — J019 Acute sinusitis, unspecified: Secondary | ICD-10-CM

## 2018-09-20 MED ORDER — AZITHROMYCIN 250 MG PO TABS: ORAL_TABLET | ORAL | 0 refills | Status: DC

## 2018-09-20 NOTE — Progress Notes (Signed)
   Subjective:    Patient ID: Frank [name], male    DOB: 12/08/1950, 67 y.o.   MRN: [id number]  HPI Here for one week of sinus pressure ,PND, and coughing up yellow sputum. No fever. He also noticed an area of white on the inside of the left cheek.which is sore. Of note he smoked and chewed tobacco for years when he was younger.    Review of Systems  Constitutional: Negative.   HENT: Positive for congestion, mouth sores, postnasal drip and sinus pressure. Negative for sinus pain and sore throat.   Respiratory: Positive for cough.        Objective:   Physical Exam  Constitutional: He appears well-developed and well-nourished.  HENT:  Right Ear: External ear normal.  Left Ear: External ear normal.  Nose: Nose normal.  The left buccal surface has a macular area of white with irregular borders   Eyes: Conjunctivae are normal.  Neck: No thyromegaly present.  Pulmonary/Chest: Effort normal and breath sounds normal. No stridor. No respiratory distress. He has no wheezes. He has no rales.  Lymphadenopathy:    He has no cervical adenopathy.          Assessment & Plan:  Sinusitis, treat with a Zpack. Add Mucinex for congestion. I advised him to keep a watch on the spot inside his cheek. If this persists after the sinusitis has resolved, he needs to see ENT or an oral surgeon to consider a biopsy.  Gershon CraneStephen Fry, MD

## 2018-12-19 ENCOUNTER — Encounter: Payer: Self-pay | Admitting: Family Medicine

## 2018-12-19 ENCOUNTER — Ambulatory Visit (INDEPENDENT_AMBULATORY_CARE_PROVIDER_SITE_OTHER): Payer: PPO | Admitting: Family Medicine

## 2018-12-19 VITALS — BP 124/78 | HR 67 | Temp 99.1°F | Ht 62.0 in | Wt 197.6 lb

## 2018-12-19 DIAGNOSIS — K120 Recurrent oral aphthae: Secondary | ICD-10-CM

## 2018-12-19 DIAGNOSIS — K219 Gastro-esophageal reflux disease without esophagitis: Secondary | ICD-10-CM | POA: Diagnosis not present

## 2018-12-19 DIAGNOSIS — Z6836 Body mass index (BMI) 36.0-36.9, adult: Secondary | ICD-10-CM

## 2018-12-19 MED ORDER — FIRST-DUKES MOUTHWASH MT SUSP: OROMUCOSAL | 0 refills | Status: DC

## 2018-12-19 MED ORDER — OMEPRAZOLE 40 MG PO CPDR: 40.0000 mg | DELAYED_RELEASE_CAPSULE | Freq: Every day | ORAL | 5 refills | Status: DC

## 2018-12-19 MED ORDER — MAGIC MOUTHWASH: 5.0000 mL | Freq: Four times a day (QID) | ORAL | 0 refills | Status: DC | PRN

## 2018-12-19 NOTE — Progress Notes (Signed)
   Chief Complaint:  Frank [name] is a 68 y.o. male who presents today with a chief complaint of gum pain.   Assessment/Plan:  Aphthous ulcer No red flags.  Likely related to recent viral URI.  Start Magic mouthwash.  Discussed reasons to return to care.  If no improvement, would consider referral to ENT.  GERD No red flags.  Will try omeprazole 40 mg daily.  BMI 36 Continue lifestyle modifications.    Subjective:  HPI:  Mouth sores Start about a week ago.  Preceding this he had an upper respiratory symptoms including cough, sneeze, and rhinorrhea.  The symptoms have since resolved however has had persistent mouth sores in his bilateral cheeks.  He thought this was due to his acid blocker medication and switch from ranitidine to famotidine.  Symptoms not improve and he additionally started having bilateral leg cramps.  He is since stopped the famotidine and the leg cramping went away however he has continued to have mouth sores.  No fevers or chills.  No nausea or vomiting.  No other treatments tried.  No rashes elsewhere.  No other obvious alleviating or aggravating factors.  ROS: Per HPI  PMH: He reports that he has quit smoking. He has never used smokeless tobacco. He reports that he does not drink alcohol or use drugs.      Objective:  Physical Exam: BP 124/78 (BP Location: Left Arm, Patient Position: Sitting, Cuff Size: Normal)   Pulse 67   Temp 99.1 F (37.3 C) (Oral)   Ht 5\' 2"  (1.575 m)   Wt 197 lb 9.6 oz (89.6 kg)   SpO2 95%   BMI 36.14 kg/m   Gen: NAD, resting comfortably HEENT: Bilateral buccal mucosa slightly erythematous with linear, white appearing streaks.  No edema.  No drainage.     Katina Degree. Jimmey Ralph, MD 12/19/2018 1:08 PM

## 2018-12-19 NOTE — Patient Instructions (Signed)
It was very nice to see you today!  Please try the magic mouthwash and the omeprazole.  Let me know if no improvement in 5-7 days.   Take care, Dr Jimmey Ralph

## 2018-12-19 NOTE — Assessment & Plan Note (Signed)
No red flags.  Will try omeprazole 40 mg daily.

## 2018-12-28 ENCOUNTER — Other Ambulatory Visit: Payer: Self-pay | Admitting: Family Medicine

## 2018-12-29 ENCOUNTER — Encounter: Payer: Self-pay | Admitting: Family Medicine

## 2018-12-29 ENCOUNTER — Ambulatory Visit (INDEPENDENT_AMBULATORY_CARE_PROVIDER_SITE_OTHER): Payer: PPO | Admitting: Family Medicine

## 2018-12-29 VITALS — BP 110/72 | HR 75 | Temp 99.0°F | Ht 62.0 in | Wt 193.8 lb

## 2018-12-29 DIAGNOSIS — K219 Gastro-esophageal reflux disease without esophagitis: Secondary | ICD-10-CM | POA: Diagnosis not present

## 2018-12-29 DIAGNOSIS — L570 Actinic keratosis: Secondary | ICD-10-CM

## 2018-12-29 DIAGNOSIS — K120 Recurrent oral aphthae: Secondary | ICD-10-CM

## 2018-12-29 NOTE — Progress Notes (Signed)
   Chief Complaint:  Frank [name] is a 68 y.o. male who presents for same day appointment with a chief complaint of mouth swelling.   Assessment/Plan:  Aphthous ulcers Given that symptoms have not improved with several weeks of conservative therapy, will place referral to ENT for further evaluation.  Magic mouthwash did not help-will not refill at this time.  Actinic keratosis Cryotherapy applied today.  Patient tolerated well.  See below procedure note.  GERD Patient did not start omeprazole.  Symptoms seem to be manageable with diet alone at this point.  Recommended patient try omeprazole if symptoms become unmanageable.     Subjective:  HPI:  Mouth Swelling, recent problem Saw about a week ago for aphthous ulcers. Was started on magic mouthwash. He has had no significant change in his symptoms.  Still has bilateral sores and pain.  Worse with certain foods.  Does not think the mouthwash helped significantly.  No recent illnesses.  No fevers or chills.  No other obvious alleviating or aggravating factors.   GERD, chronic problem, stable Seen a few weeks ago for this.  Started on Prilosec.  He has not yet started this.  He has been taking Benadryl which seems to be helping with his symptoms.  Skin lesion, new problem Started several months ago. Located on left forearm.  Stable.  No associated drainage or bleeding.  No specific treatments tried.  No other obvious alleviating or aggravating factors.  ROS: Per HPI  PMH: He reports that he has quit smoking. He has never used smokeless tobacco. He reports that he does not drink alcohol or use drugs.      Objective:  Physical Exam: BP 110/72 (BP Location: Left Arm, Patient Position: Sitting, Cuff Size: Large)   Pulse 75   Temp 99 F (37.2 C) (Oral)   Ht 5\' 2"  (1.575 m)   Wt 193 lb 12 oz (87.9 kg)   SpO2 96%   BMI 35.44 kg/m   Gen: NAD, resting comfortably HEENT: Bilateral buccal mucosa slightly erythematous with linear,  white appearing streaks. CV: Regular rate and rhythm with no murmurs appreciated Pulm: Normal work of breathing, clear to auscultation bilaterally with no crackles, wheezes, or rhonchi Skin: Approximately 3 mm hyperkeratotic lesion on left forearm.  Cryotherapy Procedure Note  Pre-operative Diagnosis: Actinic keratosis  Locations: Left forearm  Indications: Therapeutic  Procedure Details  Patient informed of risks (permanent scarring, infection, light or dark discoloration, bleeding, infection, weakness, numbness and recurrence of the lesion) and benefits of the procedure and verbal informed consent obtained.  The areas are treated with liquid nitrogen therapy, frozen until ice ball extended 2 mm beyond lesion, allowed to thaw, and treated again. The patient tolerated procedure well.  The patient was instructed on post-op care, warned that there may be blister formation, redness and pain. Recommend OTC analgesia as needed for pain.  Condition: Stable  Complications: none.   No results found for this or any previous visit (from the past 24 hour(s)).      Katina Degree. Jimmey Ralph, MD 12/29/2018 2:51 PM

## 2018-12-29 NOTE — Patient Instructions (Addendum)
It was very nice to see you today!  I will refer you to ENT for your mouth ulcer.  Please let me know if you want a refill for Magic mouthwash.  We froze a spot on your forearm today.  Please let me know if your reflux becomes more of an issue.  Please come back soon for your physical with blood work.   Take care, Dr Jimmey Ralph

## 2018-12-29 NOTE — Assessment & Plan Note (Signed)
Patient did not start omeprazole.  Symptoms seem to be manageable with diet alone at this point.  Recommended patient try omeprazole if symptoms become unmanageable.

## 2019-01-06 DIAGNOSIS — K1370 Unspecified lesions of oral mucosa: Secondary | ICD-10-CM | POA: Diagnosis not present

## 2019-01-06 DIAGNOSIS — K120 Recurrent oral aphthae: Secondary | ICD-10-CM | POA: Diagnosis not present

## 2019-02-24 ENCOUNTER — Ambulatory Visit (INDEPENDENT_AMBULATORY_CARE_PROVIDER_SITE_OTHER): Payer: PPO | Admitting: Family Medicine

## 2019-02-24 ENCOUNTER — Encounter: Payer: Self-pay | Admitting: Family Medicine

## 2019-02-24 DIAGNOSIS — I10 Essential (primary) hypertension: Secondary | ICD-10-CM

## 2019-02-24 NOTE — Assessment & Plan Note (Signed)
Doing well.  Given low blood pressure readings will cut to half of a pill of lisinopril-HCTZ daily.  Continue home blood pressure monitoring with goal 140/90 or lower.  Congratulated patient on recent weight loss-hopefully will continue to have improvement in blood pressure with continued lifestyle modifications.

## 2019-02-24 NOTE — Telephone Encounter (Signed)
This encounter was created in error - please disregard.

## 2019-02-24 NOTE — Progress Notes (Signed)
    Chief Complaint:  Frank [name] is a 68 y.o. male who presents today for a virtual office visit with a chief complaint of hypertension follow up.   Assessment/Plan:  Essential hypertension Doing well.  Given low blood pressure readings will cut to half of a pill of lisinopril-HCTZ daily.  Continue home blood pressure monitoring with goal 140/90 or lower.  Congratulated patient on recent weight loss-hopefully will continue to have improvement in blood pressure with continued lifestyle modifications.  Oral Lesion Unclear underlying etiology.  Has been evaluated by ENT who were not concerned for malignancy.  Symptoms have resolved.  We will continue with watchful waiting.  Preventative Healthcare Patient was instructed to return soon for CPE. Health Maintenance Due  Topic Date Due  . Hepatitis C Screening  1951/04/19  . COLONOSCOPY  08/31/2001  . PNA vac Low Risk Adult (2 of 2 - PPSV23) 05/13/2018     Subjective:  HPI:  Essential Hypertension - Currently on lisinopril-HCTZ 20-25mg  tablet daily and doing  - Home BPs: 110s/60s - ROS: No reported chest pain or shortness of breath  Oral lesion Patient seen about a month ago for this.  Went into follows consistent with him biting self on the mouth.  He started over-the-counter Orajel which is resolved his symptoms.  ROS: Per HPI  PMH: He reports that he has quit smoking. He has never used smokeless tobacco. He reports that he does not drink alcohol or use drugs.      Objective/Observations  Physical Exam: Gen: NAD, resting comfortably Pulm: Normal work of breathing Neuro: Grossly normal, moves all extremities Psych: Normal affect and thought content  Virtual Visit via Video   I connected with Frank [name] on 02/24/19 at  2:40 PM EDT by a video enabled telemedicine application and verified that I am speaking with the correct person using two identifiers. I discussed the limitations of evaluation and management by  telemedicine and the availability of in person appointments. The patient expressed understanding and agreed to proceed.   Patient location: Home Provider location: [place] [employer] Persons participating in the virtual visit: Myself and Patient     Katina Degree. Jimmey Ralph, MD 02/24/2019 2:53 PM

## 2019-03-27 DIAGNOSIS — R69 Illness, unspecified: Secondary | ICD-10-CM | POA: Diagnosis not present

## 2019-05-28 ENCOUNTER — Other Ambulatory Visit: Payer: Self-pay

## 2019-06-22 ENCOUNTER — Other Ambulatory Visit: Payer: Self-pay | Admitting: Family Medicine

## 2019-09-14 ENCOUNTER — Ambulatory Visit: Payer: PPO

## 2019-10-06 ENCOUNTER — Encounter: Payer: Self-pay | Admitting: Family Medicine

## 2019-10-06 ENCOUNTER — Ambulatory Visit (INDEPENDENT_AMBULATORY_CARE_PROVIDER_SITE_OTHER): Payer: PPO | Admitting: Family Medicine

## 2019-10-06 ENCOUNTER — Other Ambulatory Visit: Payer: Self-pay

## 2019-10-06 VITALS — BP 148/86 | HR 71 | Temp 98.2°F | Ht 62.0 in | Wt 196.4 lb

## 2019-10-06 DIAGNOSIS — G8929 Other chronic pain: Secondary | ICD-10-CM

## 2019-10-06 DIAGNOSIS — M25561 Pain in right knee: Secondary | ICD-10-CM

## 2019-10-06 DIAGNOSIS — M25572 Pain in left ankle and joints of left foot: Secondary | ICD-10-CM

## 2019-10-06 DIAGNOSIS — M25562 Pain in left knee: Secondary | ICD-10-CM | POA: Diagnosis not present

## 2019-10-06 DIAGNOSIS — I10 Essential (primary) hypertension: Secondary | ICD-10-CM | POA: Diagnosis not present

## 2019-10-06 MED ORDER — PREDNISONE 50 MG PO TABS: ORAL_TABLET | ORAL | 0 refills | Status: DC

## 2019-10-06 MED ORDER — METHYLPREDNISOLONE ACETATE 80 MG/ML IJ SUSP
80.0000 mg | Freq: Once | INTRAMUSCULAR | Status: AC
  Administered 2019-10-06: 80 mg via INTRAMUSCULAR

## 2019-10-06 MED ORDER — DICLOFENAC SODIUM 75 MG PO TBEC: 75.0000 mg | DELAYED_RELEASE_TABLET | Freq: Two times a day (BID) | ORAL | 0 refills | Status: DC

## 2019-10-06 NOTE — Progress Notes (Addendum)
   Chief Complaint:  Frank [name] is a 68 y.o. male who presents today with a chief complaint of knee pain.   Assessment/Plan:  Bilateral Knee Pain No red flags. Secondary to OA flare. Knee injection performed today. See below procedure note. Recommended continued ice, compression, and home exercises.   Left Ankle Pain Likely sprain.  Able to bear weight and negative Ottawa Rule - no need for imaging at this point. May also have mild osteoarthritis flare.  Will start diclofenac.  Recommended ice compression, and elevation.  Follow-up with sports medicine if not improving.  Elevated blood pressure Elevated today in setting of acute pain.  Typically well controlled.  Continue current medications.  Continue home monitoring goal 140/90 or lower.    Subjective:  HPI:  Knee Pain  Chronic problem.  Worsened over the last 2 weeks.  Would like cortisone injection today.  Has tried ice and rest with modest improvement. Has a history of OA.   Ankle pain Started a few weeks ago.  Thinks that he twisted his ankle when crossing railroad tracks when trying to pull a deer out of the woods.  Symptoms seem to be worsening.  As noted above, has tried ice, compression, and ibuprofen with modest improvement.  He is able to bear weight.  Also tried Voltaren with modest improvement.  Worse with certain movements.  Worse with walking.  No other obvious aggravating or alleviating factors.  ROS: Per HPI  PMH: He reports that he has quit smoking. He has never used smokeless tobacco. He reports that he does not drink alcohol or use drugs.      Objective:  Physical Exam: BP (!) 148/86   Pulse 71   Temp 98.2 F (36.8 C)   Ht 5\' 2"  (1.575 m)   Wt 196 lb 6.1 oz (89.1 kg)   SpO2 97%   BMI 35.92 kg/m   Gen: NAD, resting comfortably CV: Regular rate and rhythm with no murmurs appreciated Pulm: Normal work of breathing, clear to auscultation bilaterally with no crackles, wheezes, or rhonchi MSK: No edema,  cyanosis, or clubbing noted - Knees: No deformities.  Tender palpation along joint line. -Left ankle: Slight effusion noted.  Tender to palpation along anterior malleoli.  Neurovascular intact distally.  Stable anterior and posterior drawer signs. Skin: Warm, dry Neuro: Grossly normal, moves all extremities Psych: Normal affect and thought content  Knee Injection Procedure Note  Indication: Symptom relief of bilateral Knee Pain.  Procedure Details  Verbal consent was obtained for the procedure. The left joint was prepped with Betadine. Topical ethyl chloride was applied for anesthesia. A 22 gauge needle was inserted into the medial aspect of the joint.  3 ml 1% lidocaine and 1 ml of 80mg /cc Depo-Medrol was then injected into the joint. The needle was removed and the area cleansed and dressed.   The right joint then was prepped with Betadine. Topical ethyl chloride was applied for anesthesia. A 22 gauge needle was inserted into the medial aspect of the joint.  3 ml 1% lidocaine and 1 ml of 80mg /cc Depo-Medrol was then injected into the joint. The needle was removed and the area cleansed and dressed.   Complications:  None; patient tolerated the procedure well.       Algis Greenhouse. Jerline Pain, MD 10/06/2019 4:22 PM

## 2019-10-06 NOTE — Patient Instructions (Signed)
It was very nice to see you today!  I think you may have sprained your ankle.  Please use a compression wrap or a compression sleeve to the area.  Please use the diclofenac twice daily.  This should also help some with your hip.  We will inject your knee today as well.  Let me know if your symptoms worsen or not improve in the next several days.  Take care, Dr Jerline Pain  Please try these tips to maintain a healthy lifestyle:   Eat at least 3 REAL meals and 1-2 snacks per day.  Aim for no more than 5 hours between eating.  If you eat breakfast, please do so within one hour of getting up.    Obtain twice as many fruits/vegetables as protein or carbohydrate foods for both lunch and dinner. (Half of each meal should be fruits/vegetables, one quarter protein, and one quarter starchy carbs)   Cut down on sweet beverages. This includes juice, soda, and sweet tea.    Exercise at least 150 minutes every week.

## 2019-10-17 ENCOUNTER — Other Ambulatory Visit: Payer: Self-pay | Admitting: Family Medicine

## 2019-11-16 ENCOUNTER — Other Ambulatory Visit: Payer: Self-pay

## 2019-11-16 MED ORDER — DICLOFENAC SODIUM 75 MG PO TBEC: 75.0000 mg | DELAYED_RELEASE_TABLET | Freq: Two times a day (BID) | ORAL | 0 refills | Status: DC

## 2019-11-16 NOTE — Telephone Encounter (Signed)
Fax received lat given on 10/19/2019. Ok to refill?

## 2019-12-11 ENCOUNTER — Other Ambulatory Visit: Payer: Self-pay | Admitting: Family Medicine

## 2020-01-05 ENCOUNTER — Telehealth: Payer: Self-pay | Admitting: Family Medicine

## 2020-01-05 NOTE — Telephone Encounter (Signed)
I left a message asking the patient and spouse to call and schedule Medicare AWV with Courtney ([facility]).  If patient calls back, please schedule Medicare Wellness Visit (initial) at next available opening.  VDM (Dee-Dee) °

## 2020-01-06 ENCOUNTER — Other Ambulatory Visit: Payer: Self-pay

## 2020-01-06 MED ORDER — DICLOFENAC SODIUM 75 MG PO TBEC: 75.0000 mg | DELAYED_RELEASE_TABLET | Freq: Two times a day (BID) | ORAL | 0 refills | Status: DC

## 2020-01-31 ENCOUNTER — Other Ambulatory Visit: Payer: Self-pay | Admitting: Family Medicine

## 2020-02-16 ENCOUNTER — Telehealth: Payer: Self-pay | Admitting: Family Medicine

## 2020-02-16 NOTE — Telephone Encounter (Signed)
I left a message asking the patient and spouse to call and schedule Medicare AWV with Courtney ([facility]).  If patient calls back, please schedule Medicare Wellness Visit (initial) at next available opening.  VDM (Dee-Dee) °

## 2020-02-28 ENCOUNTER — Other Ambulatory Visit: Payer: Self-pay | Admitting: Family Medicine

## 2020-03-07 ENCOUNTER — Ambulatory Visit (INDEPENDENT_AMBULATORY_CARE_PROVIDER_SITE_OTHER): Payer: Medicare Other | Admitting: Family Medicine

## 2020-03-07 ENCOUNTER — Other Ambulatory Visit: Payer: Self-pay

## 2020-03-07 VITALS — BP 137/86 | HR 76 | Temp 98.0°F | Ht 62.0 in | Wt 191.2 lb

## 2020-03-07 DIAGNOSIS — M170 Bilateral primary osteoarthritis of knee: Secondary | ICD-10-CM | POA: Diagnosis not present

## 2020-03-07 DIAGNOSIS — M79604 Pain in right leg: Secondary | ICD-10-CM

## 2020-03-07 DIAGNOSIS — I10 Essential (primary) hypertension: Secondary | ICD-10-CM | POA: Diagnosis not present

## 2020-03-07 DIAGNOSIS — M79605 Pain in left leg: Secondary | ICD-10-CM

## 2020-03-07 MED ORDER — METHYLPREDNISOLONE ACETATE 80 MG/ML IJ SUSP
80.0000 mg | Freq: Once | INTRAMUSCULAR | Status: AC
  Administered 2020-03-07: 80 mg via INTRAMUSCULAR

## 2020-03-07 MED ORDER — DICLOFENAC SODIUM 75 MG PO TBEC: 75.0000 mg | DELAYED_RELEASE_TABLET | Freq: Two times a day (BID) | ORAL | 0 refills | Status: DC

## 2020-03-07 NOTE — Addendum Note (Signed)
Addended by: Erick Alley on: 03/07/2020 03:47 PM   Modules accepted: Orders

## 2020-03-07 NOTE — Assessment & Plan Note (Signed)
At goal.  Continue lisinopril-HCTZ 20-25 once daily. 

## 2020-03-07 NOTE — Patient Instructions (Signed)
It was very nice to see you today!  I am worried that she may have a pinched nerve in your back.  We will check blood work today and refer you to see a sports medicine specialist.  We injected your knees with cortisone today.  I will refill your diclofenac.  Take care, Dr Jimmey Ralph  Please try these tips to maintain a healthy lifestyle:   Eat at least 3 REAL meals and 1-2 snacks per day.  Aim for no more than 5 hours between eating.  If you eat breakfast, please do so within one hour of getting up.    Each meal should contain half fruits/vegetables, one quarter protein, and one quarter carbs (no bigger than a computer mouse)   Cut down on sweet beverages. This includes juice, soda, and sweet tea.     Drink at least 1 glass of water with each meal and aim for at least 8 glasses per day   Exercise at least 150 minutes every week.

## 2020-03-07 NOTE — Assessment & Plan Note (Signed)
Joint injection today.  See below procedure note.  Will refill diclofenac as well.

## 2020-03-07 NOTE — Progress Notes (Signed)
   Frank [name] is a 69 y.o. male who presents today for an office visit.  Assessment/Plan:  New/Acute Problems: Bilateral leg pain No red flags.  Given that symptoms seem to be worse when lying down, concern for possible spinal stenosis.  Will check labs including CBC, C met, and TSH.  Place referral to sports medicine for further evaluation.  Chronic Problems Addressed Today: Essential hypertension At goal.  Continue lisinopril-HCTZ 20-25 once daily.  Primary osteoarthritis of both knees Joint injection today.  See below procedure note.  Will refill diclofenac as well.      Subjective:  HPI:  Patient with bilateral lower leg pain for the past several months.  Worse with laying in bed.  Improves after getting up and walking around.  No back pain.  Feels like an achy sensation.  No loss of sensation.  No reported bowel or bladder incontinence.  He also requests cortisone injection in both of his knees for knee osteoarthritis.       Objective:  Physical Exam: BP 137/86   Pulse 76   Temp 98 F (36.7 C)   Ht '5\' 2"'$  (1.575 m)   Wt 191 lb 4 oz (86.8 kg)   SpO2 97%   BMI 34.98 kg/m   Gen: No acute distress, resting comfortably CV: Regular rate and rhythm with no murmurs appreciated Pulm: Normal work of breathing, clear to auscultation bilaterally with no crackles, wheezes, or rhonchi MSK:  - Back: No deformities.  Nontender to palpation -Knees: No deformities.  Tender to palpation along joint line bilaterally. Neuro: Grossly normal, moves all extremities Psych: Normal affect and thought content  Knee Injection Procedure Note  Indication: Symptom relief of bilateral Knee Pain.  Procedure Details  Verbal consent was obtained for the procedure. The left joint was prepped with Betadine. Topical ethyl chloride was applied for anesthesia. A 22 gauge needle was inserted into the medial aspect of the joint.  3 ml 1% lidocaine and 1 ml of '80mg'$ /cc Depo-Medrol was then injected  into the joint. The needle was removed and the area cleansed and dressed.  The Right joint was then prepped with Betadine. Topical ethyl chloride was applied for anesthesia. A 22 gauge needle was inserted into the medial aspect of the joint.  3 ml 1% lidocaine and 1 ml of '80mg'$ /cc Depo-Medrol was then injected into the joint. The needle was removed and the area cleansed and dressed.  Complications:  None; patient tolerated the procedure well.       Algis Greenhouse. Jerline Pain, MD 03/07/2020 3:02 PM

## 2020-03-08 LAB — COMPREHENSIVE METABOLIC PANEL
ALT: 14 U/L (ref 0–53)
AST: 14 U/L (ref 0–37)
Albumin: 4.2 g/dL (ref 3.5–5.2)
Alkaline Phosphatase: 45 U/L (ref 39–117)
BUN: 27 mg/dL — ABNORMAL HIGH (ref 6–23)
CO2: 28 mEq/L (ref 19–32)
Calcium: 9.5 mg/dL (ref 8.4–10.5)
Chloride: 106 mEq/L (ref 96–112)
Creatinine, Ser: 0.91 mg/dL (ref 0.40–1.50)
GFR: 82.72 mL/min (ref >60.00)
Glucose, Bld: 108 mg/dL — ABNORMAL HIGH (ref 70–99)
Potassium: 4.2 mEq/L (ref 3.5–5.1)
Sodium: 139 mEq/L (ref 135–145)
Total Bilirubin: 0.4 mg/dL (ref 0.2–1.2)
Total Protein: 6.5 g/dL (ref 6.0–8.3)

## 2020-03-08 LAB — CBC
HCT: 41 % (ref 39.0–52.0)
Hemoglobin: 13.9 g/dL (ref 13.0–17.0)
MCHC: 34 g/dL (ref 30.0–36.0)
MCV: 94.2 fl (ref 78.0–100.0)
Platelets: 224 10*3/uL (ref 150.0–400.0)
RBC: 4.35 Mil/uL (ref 4.22–5.81)
RDW: 12.5 % (ref 11.5–15.5)
WBC: 8.6 10*3/uL (ref 4.0–10.5)

## 2020-03-08 LAB — TSH: TSH: 1.13 u[IU]/mL (ref 0.35–4.50)

## 2020-03-08 LAB — MAGNESIUM: Magnesium: 2.1 mg/dL (ref 1.5–2.5)

## 2020-03-08 NOTE — Progress Notes (Signed)
Please inform patient of the following:  Blood work is all stable.  Do not need to make any changes to treatment plan at this time.  Recommend he follow-up with sports medicine as we discussed at his office visit.

## 2020-03-14 ENCOUNTER — Other Ambulatory Visit: Payer: Self-pay

## 2020-03-14 ENCOUNTER — Ambulatory Visit: Payer: Medicare Other | Admitting: Family Medicine

## 2020-03-14 ENCOUNTER — Encounter: Payer: Self-pay | Admitting: Family Medicine

## 2020-03-14 ENCOUNTER — Ambulatory Visit (INDEPENDENT_AMBULATORY_CARE_PROVIDER_SITE_OTHER)
Admission: RE | Admit: 2020-03-14 | Discharge: 2020-03-14 | Disposition: A | Discharge status: Home / Self Care | Payer: Medicare Other | Source: Ambulatory Visit | Attending: Family Medicine | Admitting: Family Medicine

## 2020-03-14 VITALS — BP 140/90 | HR 75 | Ht 62.0 in | Wt 194.0 lb

## 2020-03-14 DIAGNOSIS — M5136 Other intervertebral disc degeneration, lumbar region: Secondary | ICD-10-CM | POA: Diagnosis not present

## 2020-03-14 DIAGNOSIS — M25562 Pain in left knee: Secondary | ICD-10-CM

## 2020-03-14 DIAGNOSIS — M5416 Radiculopathy, lumbar region: Secondary | ICD-10-CM

## 2020-03-14 DIAGNOSIS — M25561 Pain in right knee: Secondary | ICD-10-CM

## 2020-03-14 DIAGNOSIS — M1120 Other chondrocalcinosis, unspecified site: Secondary | ICD-10-CM | POA: Diagnosis not present

## 2020-03-14 DIAGNOSIS — M170 Bilateral primary osteoarthritis of knee: Secondary | ICD-10-CM

## 2020-03-14 DIAGNOSIS — M1712 Unilateral primary osteoarthritis, left knee: Secondary | ICD-10-CM | POA: Diagnosis not present

## 2020-03-14 DIAGNOSIS — M1711 Unilateral primary osteoarthritis, right knee: Secondary | ICD-10-CM | POA: Diagnosis not present

## 2020-03-14 MED ORDER — GABAPENTIN 300 MG PO CAPS: 300.0000 mg | ORAL_CAPSULE | Freq: Every evening | ORAL | 1 refills | Status: DC | PRN

## 2020-03-14 NOTE — Progress Notes (Signed)
Subjective:   I, Debbe Odea, am serving as a scribe for Dr. Clementeen Graham.  I'm seeing this patient as a consultation for: Ardith Dark, MD. Note will be routed back to referring provider/PCP.  CC: Bilateral Leg pain   HPI: Patient is a 69 year old Male presenting to [facility] for bilateral Leg pain. Patient was referred to [place] by Dr. Jimmey Ralph where patient C/O bilateral lower leg pain for the past several months.  Worse with laying in bed.  Improves after getting up and walking around.  No back pain.  Feels like an achy sensation.  No loss of sensation.  No reported bowel or bladder incontinence. Patient states today that pain is a 3/10 and had bilateral 80 mg Depo-Medrol interarticular steroid injection on May 10 in his knees for arthritis and taking diclofenac at night and seems like it has helped. Patient states that it is not as bad as it was is able to sleep now but still is experiencing pain in the shin on both legs and on the lateral sides of both lower legs.   Past medical history, Surgical history, Family history, Social history, Allergies, and medications have been entered into the medical record, reviewed.   Review of Systems: No new headache, visual changes, nausea, vomiting, diarrhea, constipation, dizziness, abdominal pain, skin rash, fevers, chills, night sweats, weight loss, swollen lymph nodes, body aches, joint swelling, muscle aches, chest pain, shortness of breath, mood changes, visual or auditory hallucinations.   Objective:    Vitals:   03/14/20 0933  BP: 140/90  Pulse: 75  SpO2: 98%   General: Well Developed, well nourished, and in no acute distress.  Neuro/Psych: Alert and oriented x3, extra-ocular muscles intact, able to move all 4 extremities, sensation grossly intact. Skin: Warm and dry, no rashes noted.  Respiratory: Not using accessory muscles, speaking in full sentences, trachea midline.  Cardiovascular: Pulses palpable, no  extremity edema. Abdomen: Does not appear distended. MSK: Knees bilaterally genu varus mildly. No effusion. Range of motion bilaterally 0-120 degrees with crepitation. Nontender bilaterally. Stable ligamentous exams bilaterally. Negative Murray test bilaterally. Intact strength bilaterally.  L-spine normal-appearing Decreased motion to rotation and lateral flexion. Strength reflexes and sensation intact and equal bilateral lower extremities. Negative slump test bilaterally.  Lab and Radiology Results  X-ray images bilateral knees and L-spine obtained today personally and independently reviewed  Right knee: Moderate to severe medial compartment DJD with mild to moderate lateral compartment and patellofemoral.  Chondrocalcinosis present.  No acute fractures.  Left knee: Moderate to severe medial compartment DJD.  Moderate lateral and patellofemoral DJD.  Chondrocalcinosis present.  No acute fractures.  L-spine: DDD and neuroforaminal stenosis L5-S1.    Await formal radiology review  Impression and Recommendations:    Assessment and Plan: 69 y.o. male with bilateral lower extremity pain mostly at bedtime.  Certainly could be referred pain from knee arthritis however his distribution of pain is more consistent with bilateral L5.  X-rays bilateral knees and L-spine obtained today however x-ray over read by radiology is still pending.  Per my read x-rays show DJD which is not a surprise but do show evidence of chondrocalcinosis.  Some of his pain could be pseudogout if needed colchicine could be used in the future.  Knee pain is not significant at all today therefore we will hold off on further medications.  Plan for trial of gabapentin at bedtime.  Recheck 6 weeks if not improved.  Discussion regarding treatment  plan and options and next steps today in clinic.     Orders Placed This Encounter  Procedures  . DG Knee AP/LAT W/Sunrise Right    Standing Status:   Future    Number of  Occurrences:   1    Standing Expiration Date:   05/14/2021    Order Specific Question:   Reason for Exam (SYMPTOM  OR DIAGNOSIS REQUIRED)    Answer:   eval BL knee pain    Order Specific Question:   Preferred imaging location?    Answer:   [facility]    Order Specific Question:   Radiology Contrast Protocol - do NOT remove file path    Answer:   \\[email or web address]\Radiant\DXFluoroContrastProtocols.pdf  . DG Knee AP/LAT W/Sunrise Left    Standing Status:   Future    Number of Occurrences:   1    Standing Expiration Date:   05/14/2021    Order Specific Question:   Reason for Exam (SYMPTOM  OR DIAGNOSIS REQUIRED)    Answer:   eval BL knee pain    Order Specific Question:   Preferred imaging location?    Answer:   [facility]    Order Specific Question:   Radiology Contrast Protocol - do NOT remove file path    Answer:   \\[email or web address]\Radiant\DXFluoroContrastProtocols.pdf  . DG Lumbar Spine 2-3 Views    Standing Status:   Future    Number of Occurrences:   1    Standing Expiration Date:   05/14/2021    Order Specific Question:   Reason for Exam (SYMPTOM  OR DIAGNOSIS REQUIRED)    Answer:   eval poss L5 radiculopaty    Order Specific Question:   Preferred imaging location?    Answer:   [facility]    Order Specific Question:   Radiology Contrast Protocol - do NOT remove file path    Answer:   \\[email or web address]\[email or web address]s.pdf   Meds ordered this encounter  Medications  . gabapentin (NEURONTIN) 300 MG capsule    Sig: Take 1 capsule (300 mg total) by mouth at bedtime as needed (nerve pain).    Dispense:  90 capsule    Refill:  1    Discussed warning signs or symptoms. Please see discharge instructions. Patient expresses understanding.   The above documentation has been reviewed and is accurate and complete Lynne Leader, M.D.

## 2020-03-14 NOTE — Patient Instructions (Signed)
Thank you for coming in today. I think this is a nerve pain coming from your back.  Get xray today. The front desk will help you get scheduled for the right location. ([facility]).  Take gabapentin at bedtime as needed.  Recheck in 6 weeks if not better.  Contact me sooner if you have a problem.

## 2020-03-15 NOTE — Progress Notes (Signed)
Right knee x-ray shows arthritis and evidence of pseudogout.  If pain returns we could use a medication called colchicine which should help some.

## 2020-03-15 NOTE — Progress Notes (Signed)
X-ray lumbar spine shows some back arthritis slightly worse since 2018

## 2020-03-15 NOTE — Progress Notes (Signed)
Knee x-ray shows arthritis.  It also shows evidence of pseudogout.  If pain returns we could try a medicine called colchicine which should help the pseudogout.

## 2020-03-31 ENCOUNTER — Other Ambulatory Visit: Payer: Self-pay | Admitting: Family Medicine

## 2020-04-23 ENCOUNTER — Other Ambulatory Visit: Payer: Self-pay | Admitting: Family Medicine

## 2020-04-25 ENCOUNTER — Ambulatory Visit: Payer: Medicare Other | Admitting: Family Medicine

## 2020-06-15 ENCOUNTER — Other Ambulatory Visit: Payer: Self-pay

## 2020-06-15 ENCOUNTER — Ambulatory Visit (INDEPENDENT_AMBULATORY_CARE_PROVIDER_SITE_OTHER): Payer: Medicare Other | Admitting: Family Medicine

## 2020-06-15 ENCOUNTER — Encounter: Payer: Self-pay | Admitting: Family Medicine

## 2020-06-15 VITALS — BP 111/69 | HR 80 | Temp 98.2°F | Ht 62.0 in | Wt 192.2 lb

## 2020-06-15 DIAGNOSIS — M170 Bilateral primary osteoarthritis of knee: Secondary | ICD-10-CM | POA: Diagnosis not present

## 2020-06-15 DIAGNOSIS — M25562 Pain in left knee: Secondary | ICD-10-CM

## 2020-06-15 DIAGNOSIS — M25561 Pain in right knee: Secondary | ICD-10-CM | POA: Diagnosis not present

## 2020-06-15 DIAGNOSIS — G8929 Other chronic pain: Secondary | ICD-10-CM | POA: Diagnosis not present

## 2020-06-15 DIAGNOSIS — I10 Essential (primary) hypertension: Secondary | ICD-10-CM

## 2020-06-15 MED ORDER — METHYLPREDNISOLONE ACETATE 80 MG/ML IJ SUSP
80.0000 mg | Freq: Once | INTRAMUSCULAR | Status: AC
Start: 2020-06-15 — End: 2020-06-15
  Administered 2020-06-15: 80 mg via INTRA_ARTICULAR

## 2020-06-15 MED ORDER — DICLOFENAC SODIUM 75 MG PO TBEC: 75.0000 mg | DELAYED_RELEASE_TABLET | Freq: Two times a day (BID) | ORAL | 5 refills | Status: DC

## 2020-06-15 NOTE — Assessment & Plan Note (Signed)
Worsened.  Bilateral joint injection performed today.  See below procedure note.  He tolerated well.  We will also refill diclofenac.  Discussed reasons to return to care and warning signs for infection.

## 2020-06-15 NOTE — Patient Instructions (Signed)
It was very nice to see you today!  We injected both of your knees today.  No other changes today.  Please let us know when you need to have repeat injection.  Take care, Dr Jimmey Ralph  Please try these tips to maintain a healthy lifestyle:   Eat at least 3 REAL meals and 1-2 snacks per day.  Aim for no more than 5 hours between eating.  If you eat breakfast, please do so within one hour of getting up.    Each meal should contain half fruits/vegetables, one quarter protein, and one quarter carbs (no bigger than a computer mouse)   Cut down on sweet beverages. This includes juice, soda, and sweet tea.     Drink at least 1 glass of water with each meal and aim for at least 8 glasses per day   Exercise at least 150 minutes every week.

## 2020-06-15 NOTE — Assessment & Plan Note (Signed)
At goal.  Continue lisinopril-HCTZ 20-25 mg daily.

## 2020-06-15 NOTE — Progress Notes (Signed)
   Frank [name] is a 69 y.o. male who presents today for an office visit.  Assessment/Plan:  Chronic Problems Addressed Today: Essential hypertension At goal.  Continue lisinopril-HCTZ 20-25 mg daily.  Primary osteoarthritis of both knees Worsened.  Bilateral joint injection performed today.  See below procedure note.  He tolerated well.  We will also refill diclofenac.  Discussed reasons to return to care and warning signs for infection.     Subjective:  HPI:  Patient here for follow-up.  Had knee injection about 3 months ago.  Worked well but has worn off over the last couple weeks.  Still has bilateral knee pain.  Otherwise is doing well.       Objective:  Physical Exam: BP 111/69   Pulse 80   Temp 98.2 F (36.8 C)   Ht 5\' 2"  (1.575 m)   Wt 192 lb 3.2 oz (87.2 kg)   SpO2 98%   BMI 35.15 kg/m   Gen: No acute distress, resting comfortably CV: Regular rate and rhythm with no murmurs appreciated Pulm: Normal work of breathing, clear to auscultation bilaterally with no crackles, wheezes, or rhonchi Neuro: Grossly normal, moves all extremities Psych: Normal affect and thought content  Knee Injection Procedure Note  Indication: Symptom relief of bilateral Knee Pain.  Procedure Details  Verbal consent was obtained for the procedure. The left joint was prepped with Betadine. Topical ethyl chloride was applied for anesthesia. A 22 gauge needle was inserted into the medial aspect of the joint.  3 ml 1% lidocaine and 1 ml of 80mg /cc Depo-Medrol was then injected into the joint. The needle was removed and the area cleansed and dressed.  The right knee was then prepped with Betadine.  Topical ethyl chloride was applied for anesthesia.  A 22-gauge needle was inserted into the medial aspect of the joint.  3 mL of 1% lidocaine and 1 mL of 80mg /cc Depo-Medrol was then injected in the joint.  The needle was removed and the area was cleansed and dressed.  Complications:  None; patient  tolerated the procedure well.       . , MD 06/15/2020 11:23 AM

## 2020-09-14 ENCOUNTER — Encounter: Payer: Self-pay | Admitting: Family Medicine

## 2020-09-14 ENCOUNTER — Other Ambulatory Visit: Payer: Self-pay

## 2020-09-14 ENCOUNTER — Ambulatory Visit (INDEPENDENT_AMBULATORY_CARE_PROVIDER_SITE_OTHER): Payer: Medicare Other | Admitting: Family Medicine

## 2020-09-14 VITALS — BP 118/76 | HR 94 | Temp 98.6°F | Ht 62.0 in | Wt 182.0 lb

## 2020-09-14 DIAGNOSIS — R062 Wheezing: Secondary | ICD-10-CM

## 2020-09-14 DIAGNOSIS — M170 Bilateral primary osteoarthritis of knee: Secondary | ICD-10-CM

## 2020-09-14 DIAGNOSIS — I10 Essential (primary) hypertension: Secondary | ICD-10-CM

## 2020-09-14 DIAGNOSIS — R300 Dysuria: Secondary | ICD-10-CM

## 2020-09-14 LAB — POCT URINALYSIS DIPSTICK
Bilirubin, UA: NEGATIVE
Glucose, UA: NEGATIVE
Nitrite, UA: POSITIVE
Protein, UA: POSITIVE — AB
Spec Grav, UA: 1.02 (ref 1.010–1.025)
Urobilinogen, UA: 0.2 E.U./dL
pH, UA: 6 (ref 5.0–8.0)

## 2020-09-14 MED ORDER — CEFTRIAXONE SODIUM 1 G IJ SOLR
1.0000 g | Freq: Once | INTRAMUSCULAR | Status: AC
  Administered 2020-09-14: 1 g via INTRAMUSCULAR

## 2020-09-14 MED ORDER — ALBUTEROL SULFATE HFA 108 (90 BASE) MCG/ACT IN AERS: 2.0000 | INHALATION_SPRAY | Freq: Four times a day (QID) | RESPIRATORY_TRACT | 0 refills | Status: DC | PRN

## 2020-09-14 MED ORDER — SULFAMETHOXAZOLE-TRIMETHOPRIM 800-160 MG PO TABS
1.0000 | ORAL_TABLET | Freq: Two times a day (BID) | ORAL | 0 refills | Status: AC
Start: 2020-09-14 — End: 2020-09-28

## 2020-09-14 NOTE — Patient Instructions (Addendum)
It was very nice to see you today!  I think you may have a UTI.  Please start the antibiotic. Let me know if your symptoms do not improve. Please make sure you are getting plenty of fluid.  I will see you back in a few weeks for your knee injection.   Please start the albuterol for your wheezing..   Take care, Dr Jimmey Ralph  Please try these tips to maintain a healthy lifestyle:   Eat at least 3 REAL meals and 1-2 snacks per day.  Aim for no more than 5 hours between eating.  If you eat breakfast, please do so within one hour of getting up.    Each meal should contain half fruits/vegetables, one quarter protein, and one quarter carbs (no bigger than a computer mouse)   Cut down on sweet beverages. This includes juice, soda, and sweet tea.     Drink at least 1 glass of water with each meal and aim for at least 8 glasses per day   Exercise at least 150 minutes every week.

## 2020-09-14 NOTE — Assessment & Plan Note (Signed)
No red flags.  Relatively normal exam.  Has been on Advair in the past.  Will start albuterol as needed.  Discussed reasons to return to care.

## 2020-09-14 NOTE — Assessment & Plan Note (Signed)
Patient received injection about 3 months ago.  He would like to have repeat done soon.  He will come back in a few weeks to have this done.

## 2020-09-14 NOTE — Addendum Note (Signed)
Addended by: Dyann Kief on: 09/14/2020 10:20 AM   Modules accepted: Orders

## 2020-09-14 NOTE — Progress Notes (Signed)
   Frank [name] is a 69 y.o. male who presents today for an office visit.  Assessment/Plan:  New/Acute Problems: UTI UA and history consistent with UTI.  Given low-grade fevers and rectal pain concerned this may be early prostatitis.  No signs of systemic illness today. Will start course of Bactrim and give 1000 mg IM ceftriaxone today..  Will check urine culture.  Encourage good oral hydration.  Chronic Problems Addressed Today: Essential hypertension At goal.  Continue lisinopril-HCTZ 20-25 once daily.  Wheezing No red flags.  Relatively normal exam.  Has been on Advair in the past.  Will start albuterol as needed.  Discussed reasons to return to care.  Bilateral primary osteoarthritis of knee Patient received injection about 3 months ago.  He would like to have repeat done soon.  He will come back in a few weeks to have this done.      Subjective:  HPI:   Patient here with burning with urination for the last few days.  Associated symptoms include increased frequency, urgency and dribbling.  Had a fever of 101 yesterday.  Also developed some rectal pain over the last couple of days.  Over the last week or 2 is also noticed more wheezing and cough.  He has been on inhalers for this in the past but does not remember the name.  No shortness of breath.  No chest pain.        Objective:  Physical Exam: BP 118/76   Pulse 94   Temp 98.6 F (37 C) (Temporal)   Ht 5\' 2"  (1.575 m)   Wt 182 lb (82.6 kg)   SpO2 100%   BMI 33.29 kg/m   Gen: No acute distress, resting comfortably CV: Regular rate and rhythm with no murmurs appreciated Pulm: Normal work of breathing, clear to auscultation bilaterally with no crackles, wheezes, or rhonchi Neuro: Grossly normal, moves all extremities Psych: Normal affect and thought content      Frank [name] M. , MD 09/14/2020 9:29 AM

## 2020-09-14 NOTE — Assessment & Plan Note (Signed)
At goal.  Continue lisinopril-HCTZ 20-25 once daily. 

## 2020-09-16 LAB — URINE CULTURE
MICRO NUMBER:: 11215597
SPECIMEN QUALITY:: ADEQUATE

## 2020-09-19 NOTE — Progress Notes (Signed)
Please inform patient of the following:  Culture confirms UTI. The antibiotic we have him on should treat this. Would like for him to let us know if symptoms are not improving.  Frank [name]. Jimmey Ralph, MD 09/19/2020 10:11 AM

## 2020-09-27 ENCOUNTER — Encounter: Payer: Self-pay | Admitting: Family Medicine

## 2020-09-27 ENCOUNTER — Other Ambulatory Visit: Payer: Self-pay

## 2020-09-27 ENCOUNTER — Ambulatory Visit (INDEPENDENT_AMBULATORY_CARE_PROVIDER_SITE_OTHER): Payer: Medicare Other | Admitting: Family Medicine

## 2020-09-27 VITALS — BP 126/77 | HR 81 | Temp 98.6°F | Ht 62.0 in | Wt 188.0 lb

## 2020-09-27 DIAGNOSIS — M170 Bilateral primary osteoarthritis of knee: Secondary | ICD-10-CM

## 2020-09-27 DIAGNOSIS — M25562 Pain in left knee: Secondary | ICD-10-CM

## 2020-09-27 DIAGNOSIS — G8929 Other chronic pain: Secondary | ICD-10-CM

## 2020-09-27 DIAGNOSIS — N401 Enlarged prostate with lower urinary tract symptoms: Secondary | ICD-10-CM | POA: Diagnosis not present

## 2020-09-27 DIAGNOSIS — M25561 Pain in right knee: Secondary | ICD-10-CM

## 2020-09-27 DIAGNOSIS — J309 Allergic rhinitis, unspecified: Secondary | ICD-10-CM

## 2020-09-27 MED ORDER — TAMSULOSIN HCL 0.4 MG PO CAPS: 0.4000 mg | ORAL_CAPSULE | Freq: Every day | ORAL | 3 refills | Status: DC

## 2020-09-27 MED ORDER — METHYLPREDNISOLONE ACETATE 80 MG/ML IJ SUSP
80.0000 mg | Freq: Once | INTRAMUSCULAR | Status: AC
  Administered 2020-09-27: 80 mg via INTRA_ARTICULAR

## 2020-09-27 MED ORDER — AZELASTINE HCL 0.1 % NA SOLN: 2.0000 | Freq: Two times a day (BID) | NASAL | 12 refills | Status: DC

## 2020-09-27 MED ORDER — CIPROFLOXACIN HCL 500 MG PO TABS: 500.0000 mg | ORAL_TABLET | Freq: Two times a day (BID) | ORAL | 0 refills | Status: DC

## 2020-09-27 NOTE — Patient Instructions (Addendum)
It was very nice to see you today!  Please start the flomax and cipro. Let me know if your symptoms are not improving over the next several weeks.   We injected your knees today. Please use ice to the area.  I will also send in astelin.   Take care, Dr Jimmey Ralph  Please try these tips to maintain a healthy lifestyle:   Eat at least 3 REAL meals and 1-2 snacks per day.  Aim for no more than 5 hours between eating.  If you eat breakfast, please do so within one hour of getting up.    Each meal should contain half fruits/vegetables, one quarter protein, and one quarter carbs (no bigger than a computer mouse)   Cut down on sweet beverages. This includes juice, soda, and sweet tea.     Drink at least 1 glass of water with each meal and aim for at least 8 glasses per day   Exercise at least 150 minutes every week.

## 2020-09-27 NOTE — Assessment & Plan Note (Signed)
Not well controlled with over-the-counter meds.  Will start Astelin nasal spray.

## 2020-09-27 NOTE — Progress Notes (Signed)
   Frank [name] is a 69 y.o. male who presents today for an office visit.  Assessment/Plan:  New/Acute Problems: Prostatitis  Symptoms improving but still persistent.  No red flags.  Will need to switch to Cipro due to adverse reaction to Bactrim.  Also start Flomax due to his lower urinary tract symptoms.  Discussed reasons to return to care.  Consider urology referral if still no improvement after 6 weeks course of antibiotics.  Chronic Problems Addressed Today: Allergic rhinitis Not well controlled with over-the-counter meds.  Will start Astelin nasal spray.  Benign localized prostatic hyperplasia with lower urinary tract symptoms (LUTS) We will start Flomax 0.4 mg daily.  If no improvement will need referral to urology.  Bilateral primary osteoarthritis of knee Bilateral injection performed today.  He tolerated well.  See below procedure note.  Can repeat in 3 months if needed.  Recommended ice and Voltaren to the area.     Subjective:  HPI:  Patient here today for follow-up.  Was seen couple weeks ago.  Concern for prostatitis.  Urine culture was positive for E. coli.  He was started on Bactrim.  Symptoms have improved but are still persistent.  Still has continued weak stream.  Started developing hives over the last couple of days.  These resolve after stating benadryl.   He also has persistent nasal congestion.  Has been using over-the-counter meds with some improvement.  He also has bilateral knee osteoarthritis and would like to have steroid injection today.        Objective:  Physical Exam: BP 126/77   Pulse 81   Temp 98.6 F (37 C) (Temporal)   Ht 5\' 2"  (1.575 m)   Wt 188 lb (85.3 kg)   SpO2 97%   BMI 34.39 kg/m   Gen: No acute distress, resting comfortably Neuro: Grossly normal, moves all extremities Psych: Normal affect and thought content  Knee Injection Procedure Note  Indication: Symptom relief of bilateral Knee Pain.  Procedure Details  Verbal  consent was obtained for the procedure. The left joint was prepped with Betadine. Topical ethyl chloride was applied for anesthesia. A 22 gauge needle was inserted into the medial aspect of the joint.  3 ml 1% lidocaine and 1 ml of 80mg /cc Depo-Medrol was then injected into the joint. The needle was removed and the area cleansed and dressed.   The right joint was prepped with Betadine. Topical ethyl chloride was applied for anesthesia. A 22 gauge needle was inserted into the medial aspect of the joint.  3 ml 1% lidocaine and 1 ml of 80mg /cc Depo-Medrol was then injected into the joint. The needle was removed and the area cleansed and dressed.  Complications:  None; patient tolerated the procedure well.       . , MD 09/27/2020 12:12 PM

## 2020-09-27 NOTE — Assessment & Plan Note (Signed)
Bilateral injection performed today.  He tolerated well.  See below procedure note.  Can repeat in 3 months if needed.  Recommended ice and Voltaren to the area.

## 2020-09-27 NOTE — Assessment & Plan Note (Signed)
We will start Flomax 0.4 mg daily.  If no improvement will need referral to urology.

## 2020-09-28 ENCOUNTER — Telehealth: Payer: Self-pay

## 2020-09-28 NOTE — Telephone Encounter (Signed)
Pt was told the pharmacy does not have this medication, nor does any [facility] within 20 miles. ciprofloxacin (CIPRO) 500 MG tablet

## 2020-09-29 NOTE — Telephone Encounter (Signed)
Pt wife called follow up. Pt needs a different medication due to pharmacy not having the one Dr. Jimmey Ralph prescribed.

## 2020-09-30 ENCOUNTER — Other Ambulatory Visit: Payer: Self-pay | Admitting: *Deleted

## 2020-09-30 MED ORDER — CIPROFLOXACIN HCL 500 MG PO TABS: 500.0000 mg | ORAL_TABLET | Freq: Two times a day (BID) | ORAL | 0 refills | Status: AC

## 2020-09-30 NOTE — Telephone Encounter (Signed)
See below

## 2020-09-30 NOTE — Telephone Encounter (Signed)
This has already been addressed.  Frank [name]. Jimmey Ralph, MD 09/30/2020 10:08 AM

## 2020-11-08 ENCOUNTER — Telehealth: Payer: Self-pay | Admitting: Family Medicine

## 2020-11-08 NOTE — Telephone Encounter (Signed)
Left message with household member for patient to call back and schedule Medicare Annual Wellness Visit (AWV) either virtually OR in office.   No hx; please schedule at anytime with [facility] Health Advisor at [facility].  This should be a 45 minute visit.

## 2020-11-12 ENCOUNTER — Other Ambulatory Visit: Payer: Self-pay

## 2020-11-12 ENCOUNTER — Encounter: Payer: Self-pay | Admitting: Emergency Medicine

## 2020-11-12 ENCOUNTER — Ambulatory Visit (INDEPENDENT_AMBULATORY_CARE_PROVIDER_SITE_OTHER): Payer: Medicare Other

## 2020-11-12 ENCOUNTER — Ambulatory Visit
Admission: EM | Admit: 2020-11-12 | Discharge: 2020-11-12 | Disposition: A | Discharge status: Home / Self Care | Payer: Medicare Other | Attending: Emergency Medicine | Admitting: Emergency Medicine

## 2020-11-12 DIAGNOSIS — I10 Essential (primary) hypertension: Secondary | ICD-10-CM | POA: Diagnosis not present

## 2020-11-12 DIAGNOSIS — W19XXXA Unspecified fall, initial encounter: Secondary | ICD-10-CM | POA: Diagnosis not present

## 2020-11-12 DIAGNOSIS — R0781 Pleurodynia: Secondary | ICD-10-CM | POA: Diagnosis not present

## 2020-11-12 DIAGNOSIS — S2231XA Fracture of one rib, right side, initial encounter for closed fracture: Secondary | ICD-10-CM | POA: Diagnosis not present

## 2020-11-12 MED ORDER — NAPROXEN 500 MG PO TABS: 500.0000 mg | ORAL_TABLET | Freq: Two times a day (BID) | ORAL | 0 refills | Status: DC

## 2020-11-12 MED ORDER — TIZANIDINE HCL 2 MG PO TABS: 2.0000 mg | ORAL_TABLET | Freq: Four times a day (QID) | ORAL | 0 refills | Status: AC | PRN
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
## 2021-01-01 ENCOUNTER — Other Ambulatory Visit: Payer: Self-pay | Admitting: Family Medicine

## 2021-01-30 ENCOUNTER — Other Ambulatory Visit: Payer: Self-pay

## 2021-01-30 ENCOUNTER — Ambulatory Visit (INDEPENDENT_AMBULATORY_CARE_PROVIDER_SITE_OTHER): Payer: Medicare Other | Admitting: Family Medicine

## 2021-01-30 ENCOUNTER — Encounter: Payer: Self-pay | Admitting: Family Medicine

## 2021-01-30 VITALS — BP 150/82 | HR 60 | Temp 98.4°F | Ht 62.0 in | Wt 191.6 lb

## 2021-01-30 DIAGNOSIS — I10 Essential (primary) hypertension: Secondary | ICD-10-CM

## 2021-01-30 DIAGNOSIS — R300 Dysuria: Secondary | ICD-10-CM | POA: Diagnosis not present

## 2021-01-30 DIAGNOSIS — M170 Bilateral primary osteoarthritis of knee: Secondary | ICD-10-CM | POA: Diagnosis not present

## 2021-01-30 DIAGNOSIS — N401 Enlarged prostate with lower urinary tract symptoms: Secondary | ICD-10-CM

## 2021-01-30 LAB — PSA: PSA: 3.29 ng/mL (ref 0.10–4.00)

## 2021-01-30 MED ORDER — LEVOFLOXACIN 500 MG PO TABS: 500.0000 mg | ORAL_TABLET | Freq: Every day | ORAL | 0 refills | Status: DC

## 2021-01-30 MED ORDER — METHYLPREDNISOLONE ACETATE 80 MG/ML IJ SUSP
80.0000 mg | Freq: Once | INTRAMUSCULAR | Status: AC
  Administered 2021-01-30: 80 mg via INTRA_ARTICULAR

## 2021-01-30 MED ORDER — METHYLPREDNISOLONE ACETATE 80 MG/ML IJ SUSP
80.0000 mg | Freq: Once | INTRAMUSCULAR | Status: AC
Start: 2021-01-30 — End: 2021-01-30
  Administered 2021-01-30: 80 mg via INTRA_ARTICULAR

## 2021-01-30 MED ORDER — TAMSULOSIN HCL 0.4 MG PO CAPS: 0.4000 mg | ORAL_CAPSULE | Freq: Every day | ORAL | 3 refills | Status: DC

## 2021-01-30 MED ORDER — METHYLPREDNISOLONE ACETATE 80 MG/ML IJ SUSP
80.0000 mg | Freq: Once | INTRAMUSCULAR | Status: DC
Start: 2021-01-30 — End: 2021-01-30

## 2021-01-30 NOTE — Assessment & Plan Note (Signed)
Borderline.  Continue lisinopril-HCTZ 20-25 once daily.  Has been well controlled in the past.

## 2021-01-30 NOTE — Patient Instructions (Signed)
It was very nice to see you today!  We injected your knees today.  We will check a urine sample and blood work to check your prostate.  Please take the antibiotic and Flomax.  If your symptoms do not improve I will need to send you to see a urologist.  I will also refer you to see a physical therapist work a month strengthening exercises for your legs.  Take care, Dr Jimmey Ralph  PLEASE NOTE:  If you had any lab tests please let us know if you have not heard back within a few days. You may see your results on mychart before we have a chance to review them but we will give you a call once they are reviewed by [place]. If we ordered any referrals today, please let us know if you have not heard from their office within the next week.   Please try these tips to maintain a healthy lifestyle:   Eat at least 3 REAL meals and 1-2 snacks per day.  Aim for no more than 5 hours between eating.  If you eat breakfast, please do so within one hour of getting up.    Each meal should contain half fruits/vegetables, one quarter protein, and one quarter carbs (no bigger than a computer mouse)   Cut down on sweet beverages. This includes juice, soda, and sweet tea.     Drink at least 1 glass of water with each meal and aim for at least 8 glasses per day   Exercise at least 150 minutes every week.

## 2021-01-30 NOTE — Progress Notes (Signed)
   Frank [name] is a 70 y.o. male who presents today for an office visit.  Assessment/Plan:  New/Acute Problems: Dysuria Last urine culture with E. coli.  We will recheck urine culture today.  He does have a recent bout of prostatitis and it is possible this could have not cleared completely.  Will start 4-week course of Levaquin and Flomax.  If symptoms persist will need referral to urology.  Chronic Problems Addressed Today: Essential hypertension Borderline.  Continue lisinopril-HCTZ 20-25 once daily.  Has been well controlled in the past.  Benign localized prostatic hyperplasia with lower urinary tract symptoms (LUTS) We will check PSA.  Refill Flomax.  May need referral to urology if symptoms persist.  Bilateral primary osteoarthritis of knee Injections performed today.  See below procedure note.  He tolerated well.  We will also place referral to physical therapy to discuss strengthening exercises.  Briefly discussed viscosupplementation.  He would like to hold off on for now but at some point in future may refer to orthopedics or sports medicine to have this done.     Subjective:  HPI:  See A/P for status of chronic conditions.  He would like to have both of his knees injected today for arthritis.  He has noticed more weakness in his left leg.  No obvious injuries or precipitating events.  Does have a history of right hip pain that has been better recently.  He was seen several months ago for prostatitis.  Urine culture showed E. coli.  We started him on a course of Bactrim.  Symptoms improved while he was on this medication however has had more dysuria over the last several weeks.  Occasionally has some penile discharge as well.       Objective:  Physical Exam: BP (!) 150/82   Pulse 60   Temp 98.4 F (36.9 C) (Temporal)   Ht 5\' 2"  (1.575 m)   Wt 191 lb 9.6 oz (86.9 kg)   SpO2 97%   BMI 35.04 kg/m   Gen: No acute distress, resting comfortably Neuro: Grossly  normal, moves all extremities Psych: Normal affect and thought content  Knee Injection Procedure Note  Indication: Symptom relief of bilateral Knee Pain.  Procedure Details  Verbal consent was obtained for the procedure. The LEFT joint was prepped with Betadine. Topical ethyl chloride was applied for anesthesia. A 22 gauge needle was inserted into the medial aspect of the joint.  3 ml 1% lidocaine and 1 ml of 80mg /cc Depo-Medrol was then injected into the joint. The needle was removed and the area cleansed and dressed.  The RIGHT joint was prepped with Betadine. Topical ethyl chloride was applied for anesthesia. A 22 gauge needle was inserted into the medial aspect of the joint.  3 ml 1% lidocaine and 1 ml of 80mg /cc Depo-Medrol was then injected into the joint. The needle was removed and the area cleansed and dressed.  Complications:  None; patient tolerated the procedure well.        . , MD 01/30/2021 11:48 AM

## 2021-01-30 NOTE — Assessment & Plan Note (Signed)
We will check PSA.  Refill Flomax.  May need referral to urology if symptoms persist.

## 2021-01-30 NOTE — Addendum Note (Signed)
Addended by: Dyann Kief on: 01/30/2021 12:20 PM   Modules accepted: Orders

## 2021-01-30 NOTE — Assessment & Plan Note (Signed)
Injections performed today.  See below procedure note.  He tolerated well.  We will also place referral to physical therapy to discuss strengthening exercises.  Briefly discussed viscosupplementation.  He would like to hold off on for now but at some point in future may refer to orthopedics or sports medicine to have this done.

## 2021-01-31 ENCOUNTER — Ambulatory Visit: Payer: Medicare Other | Admitting: Physical Therapy

## 2021-01-31 ENCOUNTER — Encounter: Payer: Self-pay | Admitting: Physical Therapy

## 2021-01-31 DIAGNOSIS — M6281 Muscle weakness (generalized): Secondary | ICD-10-CM | POA: Diagnosis not present

## 2021-01-31 DIAGNOSIS — G8929 Other chronic pain: Secondary | ICD-10-CM | POA: Diagnosis not present

## 2021-01-31 DIAGNOSIS — M25561 Pain in right knee: Secondary | ICD-10-CM | POA: Diagnosis not present

## 2021-01-31 DIAGNOSIS — M25562 Pain in left knee: Secondary | ICD-10-CM

## 2021-01-31 LAB — URINE CULTURE
MICRO NUMBER:: 11727088
SPECIMEN QUALITY:: ADEQUATE

## 2021-01-31 NOTE — Patient Instructions (Signed)
Access Code: [email or web address] URL: [email or web address] Date: 01/31/2021 Prepared by: Zebedee Iba  Exercises Supine Bridge - 1 x daily - 3-4 x weekly - 3 sets - 10 reps Sit to Stand - 1 x daily - 3-4 x weekly - 2 sets - 10 reps Seated Hamstring Stretch - 2 x daily - 7 x weekly - 1 sets - 3 reps - 30 hold

## 2021-01-31 NOTE — Therapy (Signed)
[facility] [address] [place], [place], [address] Phone: [phone]   Fax:  [phone]  Physical Therapy Evaluation  Patient Details  Name: Frank [name] MRN: [id number] Date of Birth: 1952/10/70 Referring Provider (PT): Dr. Denyse Amass   Encounter Date: 01/31/2021   PT End of Session - 01/31/21 0955    Visit Number 1    Number of Visits 17    Authorization Type [employer] Medicare    PT Start Time 0930    PT Stop Time 1015    PT Time Calculation (min) 45 min    Activity Tolerance Patient tolerated treatment well    Behavior During Therapy [facility] for tasks assessed/performed           Past Medical History:  Diagnosis Date  . Allergy   . Asthma   . GERD (gastroesophageal reflux disease)   . Headache(784.0)   . Hypertension     Past Surgical History:  Procedure Laterality Date  . HERNIA REPAIR  1969, 2009  . kidney stones  05/20/95  . KNEE SURGERY  02/1989  . NASAL SINUS SURGERY  10/24/1992  . ROTATOR CUFF REPAIR  04/25/93    There were no vitals filed for this visit.    Subjective Assessment - 01/31/21 0940    Subjective Pt states the he has had problem with  his daily mobility ,especially with step ups and getting into the truck. He states he feels pain into the ant thigh/knee and post thigh when squatting, bending, lifting, or going up and down stairs. After the injections yesterday, he notices that it moves much better and has less pain. He has had 3 injections so far. He has had a "torn muscle" previously in the R hip. He has bilateral knee OA. He states he has weakness and difficulty with daily activities like squatting, getting up and off the floor, and doing his collectibles/antiqutes shows. He requires UE support to get up and down when working on his projects out in his garage. He is very active with antiquing and he has to move furniture frequently. He is a very active person in general. He states he feels his balance has not  changed and has not had any decrease.  Current pain 0/10, worst 8/10 Aggs: putting pressure, walking uphills, squatting, walking Eases: resting heat and Tylenol. Pt endorses night pain. Denies unexpected weight loss, fever, nausea, vomitting.    How long can you sit comfortably? 30 mins    How long can you stand comfortably? 30 mins    How long can you walk comfortably? 5-10 mins    Diagnostic tests X-ray:  1. Moderate tricompartmental osteoarthritis, most prominent in the  medial tibiofemoral compartment. There has been progression in  degenerative change from 2018 radiograph. Small joint effusion.  2. Chondrocalcinosis    Patient Stated Goals Pt states he would like to improve mobility, stairs, and floor to stand transfers. Prevent surgery.    Currently in Pain? No/denies    Multiple Pain Sites No              [facility] PT Assessment - 01/31/21 0001      Assessment   Medical Diagnosis Bilateral knee pain    Referring Provider (PT) Dr. Denyse Amass    Prior Therapy R shoulder      Precautions   Precautions None      Restrictions   Weight Bearing Restrictions No      Balance Screen   Has the patient fallen in  the past 6 months Yes    How many times? 1    Has the patient had a decrease in activity level because of a fear of falling?  No    Is the patient reluctant to leave their home because of a fear of falling?  No      Home Tourist information centre manager residence      Prior Function   Level of Independence Independent      Cognition   Overall Cognitive Status Within Functional Limits for tasks assessed      Observation/Other Assessments   Other Surveys  Lower Extremity Functional Scale    Lower Extremity Functional Scale  57.5% Function      Functional Tests   Functional tests Squat;Step up;Sit to Stand;Floor to Stand      Squat   Comments decreased depth, fwd trunk lean, lumbar rounding, decreased DF, quad dominant      Step Up   Comments requires UE support  due to pain and weakness in SLS      Sit to Stand   Comments requires UE support from raised height 20"      Posture/Postural Control   Posture Comments significant tibial varum      ROM / Strength   AROM / PROM / Strength AROM;PROM;Strength      AROM   Overall AROM Comments R knee flex 115 deg, ext -5 deg; L knee 111 flex, -5 deg ext      PROM   Overall PROM Comments R 125 flex, -2 deg ext; L 120 flex, -2 deg ext      Strength   Overall Strength Comments Hips 4+/5 throughout bilat; R and L knee ext 4+/5, flexion 4/5      Flexibility   Soft Tissue Assessment /Muscle Length yes    Hamstrings (+) supine 90/90    Quadriceps (+) Ely's      Palpation   Patella mobility restricted ML bilaterally    Palpation comment hypertonicity of quadriceps, significant crepitus with PROM      Special Tests    Special Tests Knee Special Tests    Knee Special tests  other      other    Comments (-) ant drawer bilat      Transfers   Five time sit to stand comments  unable to perform without UE support      Ambulation/Gait   Gait Pattern Step-through pattern;Decreased stride length;Decreased dorsiflexion - right;Decreased dorsiflexion - left;Decreased hip/knee flexion - left;Decreased hip/knee flexion - right    Stairs Yes    Stair Management Technique Two rails;Alternating pattern      Balance   Balance Assessed Yes      Static Standing Balance   Static Standing - Comment/# of Minutes SLS <10s bilat                      Objective measurements completed on examination: See above findings.       [facility] Adult PT Treatment/Exercise - 01/31/21 0001      Exercises   Exercises Knee/Hip      Knee/Hip Exercises: Stretches   Passive Hamstring Stretch 30 seconds;3 reps      Knee/Hip Exercises: Standing   Other Standing Knee Exercises STS table at 20"      Knee/Hip Exercises: Supine   Bridges Limitations 2x10                  PT Education - 01/31/21  2423     Education Details MOI, diagnosis, prognosis, anatomy, exercise progression, thermotherapy, muscle firing, HEP, POC    Person(s) Educated Patient    Methods Explanation;Demonstration;Tactile cues;Handout;Verbal cues    Comprehension Verbalized understanding;Returned demonstration            PT Short Term Goals - 01/31/21 1152      PT SHORT TERM GOAL #1   Title Pt will become independent with HEP in order to demonstrate synthesis of PT education.    Time 2    Period Weeks    Status New      PT SHORT TERM GOAL #2   Title Pt will have an at least 9 pt improvement in LEFS measure in order to demonstrate MCID improvement in daily function.    Time 4    Period Weeks    Status New             PT Long Term Goals - 01/31/21 1153      PT LONG TERM GOAL #1   Title Pt will have >18 pt improvement in LEFS measure in order to demonstrate MCID improvement in daily function.    Time 6    Period Weeks    Status New      PT LONG TERM GOAL #2   Title Pt will be able to perform 5XSTS in under 12s in order to demonstrate functional improvement above the cut off score for adults.    Time 8    Period Weeks      PT LONG TERM GOAL #3   Title Pt will be able to demonstrate full floor to stand, kneeling to stand, and stand to kneeling transfer without UE support in order to demonstrate functional improvement in LE function for ADL/house hold duties.    Time 8    Period Weeks    Status New                  Plan - 01/31/21 0955    Clinical Impression Statement Pt is a 70 y.o. male presenting to PT eval today with cc of bilateral knee pain. Pt demonstrates decreased bilateral knee ROM, difficulty with transfers, LE weakness, difficulty with transfers, and gait deviations. Pt's s/s are consistent with history of knee OA as well as anatomic contributions that are likely causing medial compartment deterioration. Pt's impairments limit his daily mobility, gait, and community ambulation. Pt  would benefit from continued skilled therapy in order to reach goals and maximize functional bilat LE strength and function for full return to PLOF.    Personal Factors and Comorbidities Age;Fitness;Education;Time since onset of injury/illness/exacerbation    Examination-Activity Limitations Lift;Stand;Locomotion Level;Bend;Transfers;Carry;Squat;Stairs    Examination-Participation Restrictions Occupation;Yard Work;Community Activity    Stability/Clinical Decision Making Stable/Uncomplicated    Clinical Decision Making Low    Rehab Potential Fair    PT Frequency 1x / week    PT Duration 8 weeks   Recc 2x/week, but pt requires 1x/week due to schedule and distance to clinic   PT Treatment/Interventions ADLs/Self Care Home Management;Aquatic Therapy;Electrical Stimulation;Cryotherapy;Iontophoresis 4mg /ml Dexamethasone;Moist Heat;Ultrasound;Traction;Stair training;Therapeutic activities;Therapeutic exercise;Gait training;Balance training;Neuromuscular re-education;Patient/family education;Orthotic Fit/Training;Manual techniques;Passive range of motion;Dry needling;Taping;Spinal Manipulations;Joint Manipulations    PT Next Visit Plan review HEP, heel slide, calf stretch, HR/TR, HS curl    PT Home Exercise Plan [email or web address]    Consulted and Agree with Plan of Care Patient           Patient will benefit from skilled therapeutic intervention in order to improve the  following deficits and impairments:  Abnormal gait,Pain,Improper body mechanics,Decreased coordination,Decreased mobility,Increased muscle spasms,Decreased activity tolerance,Decreased endurance,Decreased range of motion,Decreased strength,Hypomobility,Impaired flexibility,Difficulty walking  Visit Diagnosis: Chronic pain of both knees  Muscle weakness (generalized)     Problem List Patient Active Problem List   Diagnosis Date Noted  . Benign localized prostatic hyperplasia with lower urinary tract symptoms (LUTS) 09/27/2020  . Wheezing  09/14/2020  . Chondrocalcinosis 03/14/2020  . Trigger ring finger of right hand 01/22/2018  . Dyslipidemia 09/23/2017  . Bilateral primary osteoarthritis of knee 09/02/2017  . Neck pain 02/15/2017  . Primary osteoarthritis of right hip 02/15/2017  . Tear of right acetabular labrum 02/15/2017  . Right leg pain 01/04/2017  . Obese 08/17/2015  . Elevated blood sugar 08/17/2015  . Shingles 09/10/2014  . Dyspnea 11/27/2011  . HERNIA, UMBILICAL 08/02/2008  . Essential hypertension 01/30/2008  . Allergic rhinitis 01/30/2008  . GERD 01/30/2008  . HEADACHE 01/30/2008    Zebedee IbaAlan Ras Kollman PT, DPT 01/31/21 12:06 PM   [facility] [address] [address] [place], [place], [address] Phone: [phone]   Fax:  [phone]  Name: Elias ElseCharles E Schueler MRN: [id number] Date of Birth: 06/29/1951

## 2021-02-01 NOTE — Progress Notes (Signed)
Please inform patient of the following:  PSA and urine culture are NORMAL. Would like for him to let us know if his symptoms are not improving.  Katina Degree. Jimmey Ralph, MD 02/01/2021 11:24 AM

## 2021-02-06 ENCOUNTER — Other Ambulatory Visit: Payer: Self-pay

## 2021-02-06 ENCOUNTER — Ambulatory Visit (INDEPENDENT_AMBULATORY_CARE_PROVIDER_SITE_OTHER): Payer: Medicare Other | Admitting: Physical Therapy

## 2021-02-06 ENCOUNTER — Encounter: Payer: Self-pay | Admitting: Physical Therapy

## 2021-02-06 DIAGNOSIS — M6281 Muscle weakness (generalized): Secondary | ICD-10-CM | POA: Diagnosis not present

## 2021-02-06 DIAGNOSIS — M25561 Pain in right knee: Secondary | ICD-10-CM | POA: Diagnosis not present

## 2021-02-06 DIAGNOSIS — G8929 Other chronic pain: Secondary | ICD-10-CM

## 2021-02-06 DIAGNOSIS — M25562 Pain in left knee: Secondary | ICD-10-CM

## 2021-02-06 NOTE — Patient Instructions (Signed)
Access Code: [email or web address] URL: [email or web address] Date: 02/06/2021 Prepared by: Zebedee Iba  Exercises Supine Bridge - 1 x daily - 3-4 x weekly - 3 sets - 10 reps Sit to Stand - 1 x daily - 3-4 x weekly - 2 sets - 10 reps Seated Hamstring Stretch - 2 x daily - 7 x weekly - 1 sets - 3 reps - 30 hold Prone Quadriceps Stretch with Strap - 2 x daily - 7 x weekly - 1 sets - 3 reps - 30 hold

## 2021-02-06 NOTE — Therapy (Signed)
[facility] [address] [place], [place], [address] Phone: [phone]   Fax:  [phone]  Physical Therapy Treatment  Patient Details  Name: Frank [name] MRN: [id number] Date of Birth: 19-Jan-1951 Referring Provider (PT): Dr. Denyse Amass   Encounter Date: 02/06/2021   PT End of Session - 02/06/21 0804    Visit Number 2    Number of Visits 17    Authorization Type [employer] Medicare    PT Start Time 0802    PT Stop Time 0845    PT Time Calculation (min) 43 min    Activity Tolerance Patient tolerated treatment well    Behavior During Therapy [facility] for tasks assessed/performed           Past Medical History:  Diagnosis Date  . Allergy   . Asthma   . GERD (gastroesophageal reflux disease)   . Headache(784.0)   . Hypertension     Past Surgical History:  Procedure Laterality Date  . HERNIA REPAIR  1969, 2009  . kidney stones  05/20/95  . KNEE SURGERY  02/1989  . NASAL SINUS SURGERY  10/24/1992  . ROTATOR CUFF REPAIR  04/25/93    There were no vitals filed for this visit.   Subjective Assessment - 02/06/21 0803    Subjective Pt states that the knee is doing better. He had no complaints or pain with HEP. He feels he is able to move the knee more freely. Pt does note that the L knee is weaker.    How long can you sit comfortably? 30 mins    How long can you stand comfortably? 30 mins    How long can you walk comfortably? 5-10 mins    Diagnostic tests IMPRESSION:  1. Moderate tricompartmental osteoarthritis, most prominent in the  medial tibiofemoral compartment. There has been progression in  degenerative change from 2018 radiograph. Small joint effusion.  2. Chondrocalcinosis    Patient Stated Goals Pt states he would like to improve mobility, stairs, and floor to stand transfers. Prevent surgery.    Currently in Pain? No/denies    Multiple Pain Sites No                             [facility] Adult PT Treatment/Exercise  - 02/06/21 0001      Transfers   Five time sit to stand comments  unable to perform without UE support      Ambulation/Gait   Gait Pattern Step-through pattern;Decreased stride length;Decreased dorsiflexion - right;Decreased dorsiflexion - left;Decreased hip/knee flexion - left;Decreased hip/knee flexion - right    Stairs Yes    Stair Management Technique Two rails;Alternating pattern      Posture/Postural Control   Posture Comments significant tibial varum      Exercises   Exercises Knee/Hip      Knee/Hip Exercises: Stretches   Passive Hamstring Stretch 30 seconds;3 reps    Quad Stretch 30 seconds;3 reps    Gastroc Stretch 30 seconds;2 reps    Gastroc Stretch Limitations at 3" step      Knee/Hip Exercises: Standing   Other Standing Knee Exercises STS table at 20"      Knee/Hip Exercises: Supine   Bridges Limitations 2x10      Manual Therapy   Manual Therapy Soft tissue mobilization;Joint mobilization  STM quadriceps bilat                 PT Education - 02/06/21 3785  Education Details anatomy, exercise progression, exercise safety, stair management, thermotherapy, muscle firing, HEP    Person(s) Educated Patient    Methods Explanation;Tactile cues;Demonstration;Verbal cues;Handout    Comprehension Verbalized understanding;Returned demonstration;Verbal cues required            PT Short Term Goals - 01/31/21 1152      PT SHORT TERM GOAL #1   Title Pt will become independent with HEP in order to demonstrate synthesis of PT education.    Time 2    Period Weeks    Status New      PT SHORT TERM GOAL #2   Title Pt will have an at least 9 pt improvement in LEFS measure in order to demonstrate MCID improvement in daily function.    Time 4    Period Weeks    Status New             PT Long Term Goals - 01/31/21 1153      PT LONG TERM GOAL #1   Title Pt will have >18 pt improvement in LEFS measure in order to demonstrate MCID improvement in daily  function.    Time 6    Period Weeks    Status New      PT LONG TERM GOAL #2   Title Pt will be able to perform 5XSTS in under 12s in order to demonstrate functional improvement above the cut off score for adults.    Time 8    Period Weeks      PT LONG TERM GOAL #3   Title Pt will be able to demonstrate full floor to stand, kneeling to stand, and stand to kneeling transfer without UE support in order to demonstrate functional improvement in LE function for ADL/house hold duties.    Time 8    Period Weeks    Status New                 Plan - 02/06/21 0804    Clinical Impression Statement Pt presents with significant bilateral quad hypertonicity at beginning of today's session. Pt had increased soft tissue extensibility following STM. Significant localized muscle twitch response in L VL and rec fem as compared to R. Pt was able to demonstrate good form with HEP with minimal cuing and was able to incorporate prone quad stretch and gastroc stretching without increased pain. Plan to progress ROM and strengthening as tolerated. Pt would benefit from continued skilled therapy in order to reach goals and maximize functional bilat LE strength and function for full return to PLOF.    Personal Factors and Comorbidities Age;Fitness;Education;Time since onset of injury/illness/exacerbation    Examination-Activity Limitations Lift;Stand;Locomotion Level;Bend;Transfers;Carry;Squat;Stairs    Examination-Participation Restrictions Occupation;Yard Work;Community Activity    Stability/Clinical Decision Making Stable/Uncomplicated    Rehab Potential Fair    PT Frequency 1x / week    PT Duration 8 weeks   Recc 2x/week, but pt requires 1x/week due to schedule and distance to clinic   PT Treatment/Interventions ADLs/Self Care Home Management;Aquatic Therapy;Electrical Stimulation;Cryotherapy;Iontophoresis 4mg /ml Dexamethasone;Moist Heat;Ultrasound;Traction;Stair training;Therapeutic activities;Therapeutic  exercise;Gait training;Balance training;Neuromuscular re-education;Patient/family education;Orthotic Fit/Training;Manual techniques;Passive range of motion;Dry needling;Taping;Spinal Manipulations;Joint Manipulations    PT Next Visit Plan review HEP, heel slide, HR/TR, HS curl, step up    PT Home Exercise Plan [email or web address]    Consulted and Agree with Plan of Care Patient           Patient will benefit from skilled therapeutic intervention in order to improve the following deficits and impairments:  Abnormal  gait,Pain,Improper body mechanics,Decreased coordination,Decreased mobility,Increased muscle spasms,Decreased activity tolerance,Decreased endurance,Decreased range of motion,Decreased strength,Hypomobility,Impaired flexibility,Difficulty walking  Visit Diagnosis: Chronic pain of both knees  Muscle weakness (generalized)     Problem List Patient Active Problem List   Diagnosis Date Noted  . Benign localized prostatic hyperplasia with lower urinary tract symptoms (LUTS) 09/27/2020  . Wheezing 09/14/2020  . Chondrocalcinosis 03/14/2020  . Trigger ring finger of right hand 01/22/2018  . Dyslipidemia 09/23/2017  . Bilateral primary osteoarthritis of knee 09/02/2017  . Neck pain 02/15/2017  . Primary osteoarthritis of right hip 02/15/2017  . Tear of right acetabular labrum 02/15/2017  . Right leg pain 01/04/2017  . Obese 08/17/2015  . Elevated blood sugar 08/17/2015  . Shingles 09/10/2014  . Dyspnea 11/27/2011  . HERNIA, UMBILICAL 08/02/2008  . Essential hypertension 01/30/2008  . Allergic rhinitis 01/30/2008  . GERD 01/30/2008  . HEADACHE 01/30/2008    Zebedee Iba PT, DPT 02/06/21 8:51 AM   [facility] [address] [place], [place], [address] Phone: [phone]   Fax:  [phone]  Name: Frank [name] MRN: [id number] Date of Birth: 03-17-1951

## 2021-02-08 ENCOUNTER — Telehealth: Payer: Self-pay | Admitting: Family Medicine

## 2021-02-08 NOTE — Telephone Encounter (Signed)
Error

## 2021-02-13 ENCOUNTER — Encounter: Payer: Self-pay | Admitting: Physical Therapy

## 2021-02-13 ENCOUNTER — Ambulatory Visit: Payer: Medicare Other | Admitting: Physical Therapy

## 2021-02-13 ENCOUNTER — Other Ambulatory Visit: Payer: Self-pay

## 2021-02-13 DIAGNOSIS — M25562 Pain in left knee: Secondary | ICD-10-CM

## 2021-02-13 DIAGNOSIS — M25561 Pain in right knee: Secondary | ICD-10-CM

## 2021-02-13 DIAGNOSIS — M6281 Muscle weakness (generalized): Secondary | ICD-10-CM | POA: Diagnosis not present

## 2021-02-13 DIAGNOSIS — G8929 Other chronic pain: Secondary | ICD-10-CM | POA: Diagnosis not present

## 2021-02-13 NOTE — Therapy (Signed)
[facility] [address] [place], [place], [address] Phone: [phone]   Fax:  [phone]  Physical Therapy Treatment  Patient Details  Name: Frank [name] MRN: [id number] Date of Birth: 1950-11-19 Referring Provider (PT): Dr. Denyse Amass   Encounter Date: 02/13/2021   PT End of Session - 02/13/21 0857    Visit Number 3    Number of Visits 17    Authorization Type [employer] Medicare    PT Start Time 0848    PT Stop Time 0930    PT Time Calculation (min) 42 min    Activity Tolerance Patient tolerated treatment well    Behavior During Therapy [facility] for tasks assessed/performed           Past Medical History:  Diagnosis Date  . Allergy   . Asthma   . GERD (gastroesophageal reflux disease)   . Headache(784.0)   . Hypertension     Past Surgical History:  Procedure Laterality Date  . HERNIA REPAIR  1969, 2009  . kidney stones  05/20/95  . KNEE SURGERY  02/1989  . NASAL SINUS SURGERY  10/24/1992  . ROTATOR CUFF REPAIR  04/25/93    There were no vitals filed for this visit.   Subjective Assessment - 02/13/21 0851    Subjective Pt states that the pain has been better. He is in no pain currently and notices it has gotten better. He states mixed compliance with HEP due to busy with work. 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    How long can you sit comfortably? 30 mins    How long can you stand comfortably? 30 mins    How long can you walk comfortably? 5-10 mins    Diagnostic tests IMPRESSION:  1. Moderate tricompartmental osteoarthritis, most prominent in the  medial tibiofemoral compartment. There has been progression in  degenerative change from 2018 radiograph. Small joint effusion.  2. Chondrocalcinosis    Patient Stated Goals Pt states he would like to improve mobility, stairs, and floor to stand transfers. Prevent surgery.    Currently in Pain? No/denies    Multiple Pain  Sites No                             [facility] Adult PT Treatment/Exercise - 02/13/21 0001               Ambulation/Gait   Gait Pattern Step-through pattern;Decreased stride length;Decreased dorsiflexion - right;Decreased dorsiflexion - left;Decreased hip/knee flexion - left;Decreased hip/knee flexion - right    Stairs Yes    Stair Management Technique Two rails;Alternating pattern      Posture/Postural Control   Posture Comments significant tibial varum      Exercises   Exercises Knee/Hip      Knee/Hip Exercises: Stretches   Passive Hamstring Stretch 30 seconds;3 reps    Quad Stretch 30 seconds;3 reps    Gastroc Stretch 30 seconds;2 reps    Gastroc Stretch Limitations at 3" step      Knee/Hip Exercises: Standing   Other Standing Knee Exercises STS table at 20"      Knee/Hip Exercises: Supine   Bridges Limitations 2x10      Manual Therapy   Manual Therapy Soft tissue mobilization;Joint mobilization    Soft tissue mobilization bilateral quadriceps  PT Education - 02/13/21 0856    Education Details anatomy, exercise progression, exercise safety, stair management, thermotherapy, muscle firing, HEP    Person(s) Educated Patient    Methods Explanation;Demonstration;Tactile cues;Verbal cues    Comprehension Verbalized understanding;Returned demonstration            PT Short Term Goals - 01/31/21 1152      PT SHORT TERM GOAL #1   Title Pt will become independent with HEP in order to demonstrate synthesis of PT education.    Time 2    Period Weeks    Status New      PT SHORT TERM GOAL #2   Title Pt will have an at least 9 pt improvement in LEFS measure in order to demonstrate MCID improvement in daily function.    Time 4    Period Weeks    Status New             PT Long Term Goals - 01/31/21 1153      PT LONG TERM GOAL #1   Title Pt will have >18 pt improvement in LEFS measure in order to demonstrate MCID improvement  in daily function.    Time 6    Period Weeks    Status New      PT LONG TERM GOAL #2   Title Pt will be able to perform 5XSTS in under 12s in order to demonstrate functional improvement above the cut off score for adults.    Time 8    Period Weeks      PT LONG TERM GOAL #3   Title Pt will be able to demonstrate full floor to stand, kneeling to stand, and stand to kneeling transfer without UE support in order to demonstrate functional improvement in LE function for ADL/house hold duties.    Time 8    Period Weeks    Status New                 Plan - 02/13/21 0909    Clinical Impression Statement Pt had increased soft tissue extensibility following STM in L>R quadriceps. Pt continues to have demonstrate significant local twitch response in L rec fem and VL. Due to mixed compliance with HEP, pt's session was used to review all previous HEP in order to promote better carryover while pt is out of town with work. Pt was able to demonstrate good form with HEP but required increased cuing for strething position and STS technique. 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 Plan to progress exercise program at next session. Pt would benefit from continued skilled therapy in order to reach goals and maximize functional bilat LE strength and function for full return to PLOF.    Personal Factors and Comorbidities Age;Fitness;Education;Time since onset of injury/illness/exacerbation    Examination-Activity Limitations Lift;Stand;Locomotion Level;Bend;Transfers;Carry;Squat;Stairs    Examination-Participation Restrictions Occupation;Yard Work;Community Activity    Stability/Clinical Decision Making Stable/Uncomplicated    Rehab Potential Fair    PT Frequency 1x / week    PT Duration 8 weeks   Recc 2x/week, but pt requires 1x/week due to schedule and distance to clinic   PT Treatment/Interventions ADLs/Self Care Home Management;Aquatic Therapy;Electrical  Stimulation;Cryotherapy;Iontophoresis 4mg /ml Dexamethasone;Moist Heat;Ultrasound;Traction;Stair training;Therapeutic activities;Therapeutic exercise;Gait training;Balance training;Neuromuscular re-education;Patient/family education;Orthotic Fit/Training;Manual techniques;Passive range of motion;Dry needling;Taping;Spinal Manipulations;Joint Manipulations    PT Next Visit Plan review HEP, heel slide, HR/TR, HS curl, step up    PT Home Exercise Plan [email or web address]    Consulted and Agree with Plan  of Care Patient           Patient will benefit from skilled therapeutic intervention in order to improve the following deficits and impairments:  Abnormal gait,Pain,Improper body mechanics,Decreased coordination,Decreased mobility,Increased muscle spasms,Decreased activity tolerance,Decreased endurance,Decreased range of motion,Decreased strength,Hypomobility,Impaired flexibility,Difficulty walking  Visit Diagnosis: Chronic pain of both knees  Muscle weakness (generalized)     Problem List Patient Active Problem List   Diagnosis Date Noted  . Benign localized prostatic hyperplasia with lower urinary tract symptoms (LUTS) 09/27/2020  . Wheezing 09/14/2020  . Chondrocalcinosis 03/14/2020  . Trigger ring finger of right hand 01/22/2018  . Dyslipidemia 09/23/2017  . Bilateral primary osteoarthritis of knee 09/02/2017  . Neck pain 02/15/2017  . Primary osteoarthritis of right hip 02/15/2017  . Tear of right acetabular labrum 02/15/2017  . Right leg pain 01/04/2017  . Obese 08/17/2015  . Elevated blood sugar 08/17/2015  . Shingles 09/10/2014  . Dyspnea 11/27/2011  . HERNIA, UMBILICAL 08/02/2008  . Essential hypertension 01/30/2008  . Allergic rhinitis 01/30/2008  . GERD 01/30/2008  . HEADACHE 01/30/2008    Zebedee Iba PT, DPT 02/13/21 9:32 AM   [facility] [address] [place], [place], [address] Phone: [phone]   Fax:  [phone]  Name:  DENYM RAHIMI MRN: [id number] Date of Birth: 1951/06/28

## 2021-02-13 NOTE — Patient Instructions (Signed)
Access Code: [email or web address] URL: [email or web address] Date: 02/13/2021 Prepared by: Zebedee Iba  Exercises Supine Bridge - 1 x daily - 3-4 x weekly - 3 sets - 10 reps Sit to Stand - 1 x daily - 3-4 x weekly - 2 sets - 10 reps Seated Hamstring Stretch - 2 x daily - 7 x weekly - 1 sets - 3 reps - 30 hold Prone Quadriceps Stretch with Strap - 2 x daily - 7 x weekly - 1 sets - 3 reps - 30 hold Standing Gastroc Stretch on Step - 2 x daily - 7 x weekly - 1 sets - 3 reps - 30 hold

## 2021-02-14 ENCOUNTER — Telehealth: Payer: Self-pay | Admitting: Family Medicine

## 2021-02-14 NOTE — Chronic Care Management (AMB) (Signed)
  Chronic Care Management   Outreach Note  02/14/2021 Name: Frank [name] MRN: [id number] DOB: 1951-02-14  Referred by: Ardith Dark, MD Reason for referral : No chief complaint on file.   An unsuccessful telephone outreach was attempted today. The patient was referred to the pharmacist for assistance with care management and care coordination.   Follow Up Plan:   Carmell Austria Upstream Scheduler

## 2021-02-20 ENCOUNTER — Encounter: Payer: Medicare Other | Admitting: Physical Therapy

## 2021-02-27 ENCOUNTER — Encounter: Payer: Medicare Other | Admitting: Physical Therapy

## 2021-03-06 ENCOUNTER — Encounter: Payer: Self-pay | Admitting: Physical Therapy

## 2021-03-06 ENCOUNTER — Other Ambulatory Visit: Payer: Self-pay

## 2021-03-06 ENCOUNTER — Ambulatory Visit: Payer: Medicare Other | Admitting: Physical Therapy

## 2021-03-06 DIAGNOSIS — M25561 Pain in right knee: Secondary | ICD-10-CM | POA: Diagnosis not present

## 2021-03-06 DIAGNOSIS — M25562 Pain in left knee: Secondary | ICD-10-CM | POA: Diagnosis not present

## 2021-03-06 DIAGNOSIS — M6281 Muscle weakness (generalized): Secondary | ICD-10-CM

## 2021-03-06 DIAGNOSIS — G8929 Other chronic pain: Secondary | ICD-10-CM

## 2021-03-06 NOTE — Therapy (Signed)
[facility] [address] [place], [place], [address] Phone: [phone]   Fax:  [phone]  Physical Therapy Treatment  Patient Details  Name: Frank [name] MRN: [id number] Date of Birth: Sep 26, 1951 Referring Provider (PT): Dr. Denyse Amass   Encounter Date: 03/06/2021   PT End of Session - 03/06/21 0854    Visit Number 4    Number of Visits 17    Authorization Type [employer] Medicare    PT Start Time [phone]    PT Stop Time 0930    PT Time Calculation (min) 44 min    Activity Tolerance Patient tolerated treatment well    Behavior During Therapy [facility] for tasks assessed/performed           Past Medical History:  Diagnosis Date  . Allergy   . Asthma   . GERD (gastroesophageal reflux disease)   . Headache(784.0)   . Hypertension     Past Surgical History:  Procedure Laterality Date  . HERNIA REPAIR  1969, 2009  . kidney stones  05/20/95  . KNEE SURGERY  02/1989  . NASAL SINUS SURGERY  10/24/1992  . ROTATOR CUFF REPAIR  04/25/93    There were no vitals filed for this visit.   Subjective Assessment - 03/06/21 0852    Subjective Pt states the pain comes and goes at this point. 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 The pain has not increased since our last visit. 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 Pt reports falling forward on while carrying a chair while loading a trailer last week. He states the chair was obscuring his vision and he tripped over the lip of a brick in the walkway. Pt states he fell forward onto the chair and scraped his lip.Pt denies feelings of legs giving out or knee pain causing him to fall.    How long can you sit comfortably? 30 mins    How long can you stand comfortably? 30 mins    How long can you walk comfortably? 5-10 mins    Diagnostic tests IMPRESSION:  1. Moderate tricompartmental osteoarthritis, most prominent in the  medial tibiofemoral compartment. There has been  progression in  degenerative change from 2018 radiograph. Small joint effusion.  2. Chondrocalcinosis    Patient Stated Goals Pt states he would like to improve mobility, stairs, and floor to stand transfers. Prevent surgery.    Currently in Pain? No/denies    Multiple Pain Sites No                             [facility] Adult PT Treatment/Exercise - 03/06/21 0001      Transfers   Five time sit to stand comments  unable to perform without UE support      Ambulation/Gait   Gait Pattern Step-through pattern;Decreased stride length;Decreased dorsiflexion - right;Decreased dorsiflexion - left;Decreased hip/knee flexion - left;Decreased hip/knee flexion - right    Stairs Yes    Stair Management Technique Two rails;Alternating pattern      Posture/Postural Control   Posture Comments significant tibial varum      Exercises   Exercises Knee/Hip      Knee/Hip Exercises: Stretches   Passive Hamstring Stretch 30 seconds;3 reps    Quad Stretch 30 seconds;3 reps    Gastroc Stretch 30 seconds;2 reps    Gastroc Stretch Limitations at 3" step  Knee/Hip Exercises: Aerobic   Recumbent Bike L1 30s      Knee/Hip Exercises: Standing   Other Standing Knee Exercises STS table at 20"    Other Standing Knee Exercises eccentric heel drop at step 10x 5x      Knee/Hip Exercises: Supine   Bridges Limitations 2x10                               PT Education - 03/06/21 0854    Education Details anatomy, exercise progression, exercise safety, stair management, muscle firing, HEP    Person(s) Educated Patient    Methods Explanation;Demonstration;Tactile cues;Verbal cues    Comprehension Verbalized understanding;Returned demonstration;Verbal cues required;Tactile cues required            PT Short Term Goals - 01/31/21 1152      PT SHORT TERM GOAL #1   Title Pt will become independent with HEP in order to demonstrate synthesis of PT education.    Time 2    Period  Weeks    Status New      PT SHORT TERM GOAL #2   Title Pt will have an at least 9 pt improvement in LEFS measure in order to demonstrate MCID improvement in daily function.    Time 4    Period Weeks    Status New             PT Long Term Goals - 01/31/21 1153      PT LONG TERM GOAL #1   Title Pt will have >18 pt improvement in LEFS measure in order to demonstrate MCID improvement in daily function.    Time 6    Period Weeks    Status New      PT LONG TERM GOAL #2   Title Pt will be able to perform 5XSTS in under 12s in order to demonstrate functional improvement above the cut off score for adults.    Time 8    Period Weeks      PT LONG TERM GOAL #3   Title Pt will be able to demonstrate full floor to stand, kneeling to stand, and stand to kneeling transfer without UE support in order to demonstrate functional improvement in LE function for ADL/house hold duties.    Time 8    Period Weeks    Status New                 Plan - 03/06/21 1025    Clinical Impression Statement Pt had improved knee flexion ROM following warm up and stretching exercise. Due to pt report of no compliance with HEP, today's session was used to review HEP. Pt was able to demonstrate good retention of performing HEP but required VC and TC for form and technique, especially with STS. Pt's L knee is stiffer than R knee with quad and triceps surae stretching exercise. Pt to progress next session with strengthening exercise. Pt did report 1 fall while moving furniture with minor skin abrasions on nose. Pt's fall did not appear related to knee pain or dysfunction, based on pt report. Plan to continue with strengthening exercise as able at next session. Pt would benefit from continued skilled therapy in order to reach goals and maximize functional bilat LE strength and ROM for full return to PLOF.    Personal Factors and Comorbidities Age;Fitness;Education;Time since onset of injury/illness/exacerbation     Examination-Activity Limitations Lift;Stand;Locomotion Level;Bend;Transfers;Carry;Squat;Stairs    Examination-Participation  Restrictions Occupation;Yard Work;Community Activity    Stability/Clinical Decision Making Stable/Uncomplicated    Rehab Potential Fair    PT Frequency 1x / week    PT Duration 8 weeks   Recc 2x/week, but pt requires 1x/week due to schedule and distance to clinic   PT Treatment/Interventions ADLs/Self Care Home Management;Aquatic Therapy;Electrical Stimulation;Cryotherapy;Iontophoresis 4mg /ml Dexamethasone;Moist Heat;Ultrasound;Traction;Stair training;Therapeutic activities;Therapeutic exercise;Gait training;Balance training;Neuromuscular re-education;Patient/family education;Orthotic Fit/Training;Manual techniques;Passive range of motion;Dry needling;Taping;Spinal Manipulations;Joint Manipulations    PT Next Visit Plan review HEP, heel slide, HR/TR, HS curl, step up    PT Home Exercise Plan [email or web address]    Consulted and Agree with Plan of Care Patient           Patient will benefit from skilled therapeutic intervention in order to improve the following deficits and impairments:  Abnormal gait,Pain,Improper body mechanics,Decreased coordination,Decreased mobility,Increased muscle spasms,Decreased activity tolerance,Decreased endurance,Decreased range of motion,Decreased strength,Hypomobility,Impaired flexibility,Difficulty walking  Visit Diagnosis: Chronic pain of both knees  Muscle weakness (generalized)     Problem List Patient Active Problem List   Diagnosis Date Noted  . Benign localized prostatic hyperplasia with lower urinary tract symptoms (LUTS) 09/27/2020  . Wheezing 09/14/2020  . Chondrocalcinosis 03/14/2020  . Trigger ring finger of right hand 01/22/2018  . Dyslipidemia 09/23/2017  . Bilateral primary osteoarthritis of knee 09/02/2017  . Neck pain 02/15/2017  . Primary osteoarthritis of right hip 02/15/2017  . Tear of right acetabular labrum 02/15/2017   . Right leg pain 01/04/2017  . Obese 08/17/2015  . Elevated blood sugar 08/17/2015  . Shingles 09/10/2014  . Dyspnea 11/27/2011  . HERNIA, UMBILICAL 08/02/2008  . Essential hypertension 01/30/2008  . Allergic rhinitis 01/30/2008  . GERD 01/30/2008  . HEADACHE 01/30/2008   03/31/2008 PT, DPT 03/06/21 12:19 PM   [facility] [address] [place], [place], [place] Phone: [phone]   Fax:  [phone]  Name: MUHANNAD BIGNELL MRN: [name] Date of Birth: [birth year]

## 2021-03-13 ENCOUNTER — Ambulatory Visit: Payer: Medicare Other | Admitting: Physical Therapy

## 2021-03-13 ENCOUNTER — Encounter: Payer: Self-pay | Admitting: Physical Therapy

## 2021-03-13 ENCOUNTER — Other Ambulatory Visit: Payer: Self-pay

## 2021-03-13 DIAGNOSIS — M25561 Pain in right knee: Secondary | ICD-10-CM

## 2021-03-13 DIAGNOSIS — M6281 Muscle weakness (generalized): Secondary | ICD-10-CM | POA: Diagnosis not present

## 2021-03-13 DIAGNOSIS — R262 Difficulty in walking, not elsewhere classified: Secondary | ICD-10-CM

## 2021-03-13 DIAGNOSIS — G8929 Other chronic pain: Secondary | ICD-10-CM | POA: Diagnosis not present

## 2021-03-13 DIAGNOSIS — M25562 Pain in left knee: Secondary | ICD-10-CM

## 2021-03-13 NOTE — Patient Instructions (Signed)
Access Code: [email or web address] URL: [email or web address] Date: 03/13/2021 Prepared by: Zebedee Iba  Exercises Supine Bridge - 1 x daily - 3-4 x weekly - 3 sets - 10 reps Sit to Stand - 1 x daily - 3-4 x weekly - 2 sets - 10 reps Seated Hamstring Stretch - 2 x daily - 7 x weekly - 1 sets - 3 reps - 30 hold Prone Quadriceps Stretch with Strap - 2 x daily - 7 x weekly - 1 sets - 3 reps - 30 hold Standing Gastroc Stretch on Step - 2 x daily - 7 x weekly - 1 sets - 3 reps - 30 hold Single Leg Stance - 1 x daily - 7 x weekly - 1 sets - 5 reps - 15 hold

## 2021-03-13 NOTE — Therapy (Signed)
[facility] [address] [place], [place], [address] Phone: [phone]   Fax:  [phone]            Physical Therapy Progress Note Patient Details  Name: Frank [name] MRN: [id number] Date of Birth: 07-03-1951 Referring Provider (PT): Dr. Georgina Snell   Encounter Date: 03/13/2021   PT End of Session - 03/13/21 0806    Visit Number 5    Number of Visits 17    Date for PT Re-Evaluation 04/12/21    Authorization Type [employer] Medicare    PT Start Time 0805    PT Stop Time 0846    PT Time Calculation (min) 41 min    Activity Tolerance Patient tolerated treatment well    Behavior During Therapy [facility] for tasks assessed/performed           Past Medical History:  Diagnosis Date  . Allergy   . Asthma   . GERD (gastroesophageal reflux disease)   . Headache(784.0)   . Hypertension     Past Surgical History:  Procedure Laterality Date  . [place], 2009  . kidney stones  05/20/95  . KNEE SURGERY  02/1989  . NASAL SINUS SURGERY  10/24/1992  . ROTATOR CUFF REPAIR  04/25/93    There were no vitals filed for this visit.   Subjective Assessment - 03/13/21 0812    Subjective Pt states that the knee pain is better. He states it is not as intense when it happens but it still hurts just about every day, low grade pain. 2-3/10    How long can you sit comfortably? 30 mins    How long can you stand comfortably? 30 mins    How long can you walk comfortably? 5-10 mins    Diagnostic tests IMPRESSION:  1. Moderate tricompartmental osteoarthritis, most prominent in the  medial tibiofemoral compartment. There has been progression in  degenerative change from 2018 radiograph. Small joint effusion.  2. Chondrocalcinosis    Patient Stated Goals Pt states he would like to improve mobility, stairs, and floor to stand transfers. Prevent surgery.    Currently in Pain? No/denies    Multiple Pain Sites No              [facility] PT Assessment - 03/13/21  0819      Assessment   Medical Diagnosis Bilateral knee pain    Referring Provider (PT) Dr. Georgina Snell    Prior Therapy R shoulder      Precautions   Precautions None      Restrictions   Weight Bearing Restrictions No      Home Environment   Living Environment Private residence      Prior Function   Level of Independence Independent      Cognition   Overall Cognitive Status Within Functional Limits for tasks assessed      Observation/Other Assessments   Other Surveys  Lower Extremity Functional Scale    Lower Extremity Functional Scale  TBD      Functional Tests   Functional tests Squat;Step up;Sit to Stand;Floor to Stand      Squat   Comments able to reach above parallel, limited by knee flexion and ankle DF ROM      Step Up   Comments UE support needed on R, none needed on L      Sit to Stand   Comments WNL from standard height chair      Posture/Postural Control   Posture Comments significant  tibial varum      AROM   Overall AROM Comments R knee flex 126 deg, ext 0 deg; L knee 119 flex, 0 deg ext      PROM   Overall PROM Comments R 126 flex, 1 deg ext; L 130 flex, 2 deg ext      Strength   Overall Strength Comments Hips 4+/5 throughout bilat; R and L knee ext 4+/5, flexion 4+/5      Flexibility   Soft Tissue Assessment /Muscle Length yes    Hamstrings (+) supine 90/90    Quadriceps (+) Ely's      Palpation   Patella mobility restricted ML bilaterally    Palpation comment hypertonicity of quadriceps bilat, R HS      Special Tests    Special Tests --    Knee Special tests  --      Transfers   Five time sit to stand comments  10.9s standard height chair      Ambulation/Gait   Gait Pattern Step-through pattern;Decreased stride length;Decreased dorsiflexion - right;Decreased dorsiflexion - left;Decreased hip/knee flexion - left;Decreased hip/knee flexion - right    Stairs Yes    Stair Management Technique Alternating pattern;One rail Left      Balance    Balance Assessed Yes      Static Standing Balance   Static Standing Balance -  Activities  Tandam Stance - Left Leg;Tandam Stance - Right Leg;Single Leg Stance - Right Leg;Single Leg Stance - Left Leg    Static Standing - Comment/# of Minutes >10s each                         [facility] Adult PT Treatment/Exercise - 03/13/21 0819      Exercises   Exercises Knee/Hip      Knee/Hip Exercises: Stretches   Passive Hamstring Stretch 30 seconds;3 reps    Quad Stretch 30 seconds;3 reps    Gastroc Stretch 30 seconds;2 reps    Gastroc Stretch Limitations at 3" step      Knee/Hip Exercises: Aerobic   Recumbent Bike L1 (improved knee flexion)      Knee/Hip Exercises: Standing   Other Standing Knee Exercises STS table at 18"    Other Standing Knee Exercises eccentric heel drop at step 10x 5x; tandem balance and SLS 15s 4x      Manual Therapy   Manual Therapy Soft tissue mobilization    Soft tissue mobilization R HS in prone, decreased pain following                  PT Education - 03/13/21 0903    Education Details anatomy, exercise progression, exercise safety, muscle firing, HEP, exam results, squat/work compensations    Person(s) Educated Patient    Methods Explanation;Demonstration;Tactile cues;Verbal cues;Handout    Comprehension Verbalized understanding;Verbal cues required;Returned demonstration;Tactile cues required            PT Short Term Goals - 03/13/21 0910      PT SHORT TERM GOAL #1   Title Pt will become independent with HEP in order to demonstrate synthesis of PT education.    Time 2    Period Weeks    Status Partially Met      PT SHORT TERM GOAL #2   Title Pt will have an at least 9 pt improvement in LEFS measure in order to demonstrate MCID improvement in daily function.    Time 4    Period Weeks  Status Unable to assess             PT Long Term Goals - 03/13/21 0910      PT LONG TERM GOAL #1   Title Pt will have >18 pt  improvement in LEFS measure in order to demonstrate MCID improvement in daily function.    Time 6    Period Weeks    Status Unable to assess      PT LONG TERM GOAL #2   Title Pt will be able to perform 5XSTS in under 12s in order to demonstrate functional improvement above the cut off score for adults.    Time 8    Period Weeks    Status Achieved      PT LONG TERM GOAL #3   Title Pt will be able to demonstrate full floor to stand, kneeling to stand, and stand to kneeling transfer without UE support in order to demonstrate functional improvement in LE function for ADL/house hold duties.    Time 8    Period Weeks    Status Partially Met                 Plan - 03/13/21 0904    Clinical Impression Statement Pt demonstrates improvement in bilateral knee function as demonstrated by objective measures and movement screening. Due to pt's requirement of assistance with documents, pt to bring in LEFS outcome measure at next visit. Pt has improved bilateral LE strength and ROM that has aided in decreased intensity of pain as well as improvement with general daily mobility and transfers. Pt does still present with signficant joint stifness as well as LE muscle endurance deficits that would benefit from continued skilled care. Pt has improved quality of tranfsers as well as stair management. Pt to begin incorporated more motor control and balance tasks into exercise program to improve stability. Pt would benefit from continued skilled therapy to maximize functional mobility as well as prevent further decline in mobility.    Personal Factors and Comorbidities Age;Fitness;Education;Time since onset of injury/illness/exacerbation    Examination-Activity Limitations Lift;Stand;Locomotion Level;Bend;Transfers;Carry;Squat;Stairs    Examination-Participation Restrictions Occupation;Yard Work;Community Activity    Stability/Clinical Decision Making Stable/Uncomplicated    Rehab Potential Fair    PT  Frequency 1x / week    PT Duration 8 weeks   Recc 2x/week, but pt requires 1x/week due to schedule and distance to clinic   PT Treatment/Interventions ADLs/Self Care Home Management;Aquatic Therapy;Electrical Stimulation;Cryotherapy;Iontophoresis 4mg /ml Dexamethasone;Moist Heat;Ultrasound;Traction;Stair training;Therapeutic activities;Therapeutic exercise;Gait training;Balance training;Neuromuscular re-education;Patient/family education;Orthotic Fit/Training;Manual techniques;Passive range of motion;Dry needling;Taping;Spinal Manipulations;Joint Manipulations    PT Next Visit Plan review HEP, prone knee mob, HR/TR, HS curl, lateral step up    PT Home Exercise Plan [email or web address]    Consulted and Agree with Plan of Care Patient           Patient will benefit from skilled therapeutic intervention in order to improve the following deficits and impairments:  Abnormal gait,Pain,Improper body mechanics,Decreased coordination,Decreased mobility,Increased muscle spasms,Decreased activity tolerance,Decreased endurance,Decreased range of motion,Decreased strength,Hypomobility,Impaired flexibility,Difficulty walking  Visit Diagnosis: Chronic pain of both knees  Muscle weakness (generalized)  Difficulty walking     Problem List Patient Active Problem List   Diagnosis Date Noted  . Benign localized prostatic hyperplasia with lower urinary tract symptoms (LUTS) 09/27/2020  . Wheezing 09/14/2020  . Chondrocalcinosis 03/14/2020  . Trigger ring finger of right hand 01/22/2018  . Dyslipidemia 09/23/2017  . Bilateral primary osteoarthritis of knee 09/02/2017  . Neck pain 02/15/2017  . Primary osteoarthritis  of right hip 02/15/2017  . Tear of right acetabular labrum 02/15/2017  . Right leg pain 01/04/2017  . Obese 08/17/2015  . Elevated blood sugar 08/17/2015  . Shingles 09/10/2014  . Dyspnea 11/27/2011  . HERNIA, UMBILICAL 28/83/3744  . Essential hypertension 01/30/2008  . Allergic rhinitis  01/30/2008  . GERD 01/30/2008  . HEADACHE 01/30/2008    Frank [name] PT, DPT 03/13/21 9:12 AM   [facility] [place], [place], [address] Phone: [phone]   Fax:  [phone]  Name: Frank [name] MRN: [id number] Date of Birth: 1951/04/14

## 2021-03-20 ENCOUNTER — Encounter: Payer: Self-pay | Admitting: Physical Therapy

## 2021-03-20 ENCOUNTER — Other Ambulatory Visit: Payer: Self-pay

## 2021-03-20 ENCOUNTER — Ambulatory Visit: Payer: Medicare Other | Admitting: Physical Therapy

## 2021-03-20 DIAGNOSIS — G8929 Other chronic pain: Secondary | ICD-10-CM

## 2021-03-20 DIAGNOSIS — M6281 Muscle weakness (generalized): Secondary | ICD-10-CM

## 2021-03-20 DIAGNOSIS — R262 Difficulty in walking, not elsewhere classified: Secondary | ICD-10-CM

## 2021-03-20 DIAGNOSIS — M25562 Pain in left knee: Secondary | ICD-10-CM

## 2021-03-20 DIAGNOSIS — M25561 Pain in right knee: Secondary | ICD-10-CM | POA: Diagnosis not present

## 2021-03-20 NOTE — Therapy (Signed)
[facility] [address] [place], [place], [address] Phone: [phone]   Fax:  [phone]  Physical Therapy Treatment  Patient Details  Name: Frank [name] MRN: [id number] Date of Birth: October 23, 1951 Referring Provider (PT): Dr. Georgina Snell   Encounter Date: 03/20/2021   PT End of Session - 03/20/21 0808    Visit Number 6    Number of Visits 17    Date for PT Re-Evaluation 04/12/21    Authorization Type [employer] Medicare    PT Start Time 0803    PT Stop Time 0843    PT Time Calculation (min) 40 min    Activity Tolerance Patient tolerated treatment well    Behavior During Therapy [facility] for tasks assessed/performed           Past Medical History:  Diagnosis Date  . Allergy   . Asthma   . GERD (gastroesophageal reflux disease)   . Headache(784.0)   . Hypertension     Past Surgical History:  Procedure Laterality Date  . [place], 2009  . kidney stones  05/20/95  . KNEE SURGERY  02/1989  . NASAL SINUS SURGERY  10/24/1992  . ROTATOR CUFF REPAIR  04/25/93    There were no vitals filed for this visit.   Subjective Assessment - 03/20/21 0807    Subjective Pt states the knees are occasionally sore when he forgets to take medication but overall things are doing better. He feels he is able to squat better while working.    How long can you sit comfortably? 30 mins    How long can you stand comfortably? 30 mins    How long can you walk comfortably? 5-10 mins    Diagnostic tests IMPRESSION:  1. Moderate tricompartmental osteoarthritis, most prominent in the  medial tibiofemoral compartment. There has been progression in  degenerative change from 2018 radiograph. Small joint effusion.  2. Chondrocalcinosis    Patient Stated Goals Pt states he would like to improve mobility, stairs, and floor to stand transfers. Prevent surgery.    Currently in Pain? No/denies    Multiple Pain Sites No              [facility] PT Assessment -  03/20/21 0001      Observation/Other Assessments   Other Surveys  Lower Extremity Functional Scale  87.5%                        [facility] Adult PT Treatment/Exercise - 03/20/21 0001      Transfers   Five time sit to stand comments  --      Ambulation/Gait   Gait Pattern Step-through pattern;Decreased stride length;Decreased dorsiflexion - right;Decreased dorsiflexion - left;Decreased hip/knee flexion - left;Decreased hip/knee flexion - right    Stairs Yes    Stair Management Technique Alternating pattern;One rail Left      Posture/Postural Control   Posture Comments significant tibial varum      Exercises   Exercises Knee/Hip      Knee/Hip Exercises: Stretches   Passive Hamstring Stretch 30s 3x each                 Knee/Hip Exercises: Aerobic   Recumbent Bike L2 5 mins increased resistance      Knee/Hip Exercises: Standing   Other Standing Knee Exercises STS table at 18"    Other Standing Knee Exercises eccentric heel drop at step 10x 5x; tandem balance and SLS 15s 4x; HR/TR 2x10  Knee/Hip Exercises: Supine   Straight Leg Raises Limitations 2x10 2lbs      Manual Therapy   Manual Therapy Soft tissue mobilization    Soft tissue mobilization R HS in prone, decreased pain following                  PT Education - 03/20/21 1121    Education Details self management of pain/stiffness, exercise progression, muscle firing, HEP,  squat/work compensations    Person(s) Educated Patient    Methods Explanation;Demonstration;Tactile cues;Verbal cues    Comprehension Verbalized understanding;Returned demonstration;Verbal cues required;Tactile cues required            PT Short Term Goals - 03/13/21 0910      PT SHORT TERM GOAL #1   Title Pt will become independent with HEP in order to demonstrate synthesis of PT education.    Time 2    Period Weeks    Status Partially Met      PT SHORT TERM GOAL #2   Title Pt will have an at least 9 pt  improvement in LEFS measure in order to demonstrate MCID improvement in daily function.    Time 4    Period Weeks    Status Achieved            PT Long Term Goals - 03/13/21 0910      PT LONG TERM GOAL #1   Title Pt will have >18 pt improvement in LEFS measure in order to demonstrate MCID improvement in daily function.    Time 6    Period Weeks    Status Achieved     PT LONG TERM GOAL #2   Title Pt will be able to perform 5XSTS in under 12s in order to demonstrate functional improvement above the cut off score for adults.    Time 8    Period Weeks    Status Achieved      PT LONG TERM GOAL #3   Title Pt will be able to demonstrate full floor to stand, kneeling to stand, and stand to kneeling transfer without UE support in order to demonstrate functional improvement in LE function for ADL/house hold duties.    Time 8    Period Weeks    Status Partially Met                 Plan - 03/20/21 0817    Clinical Impression Statement Pt was able to include more directed quadricepstrengthening at today's session without increased pain. Pt's R quad demonstrated endurance deficits likely due to history of injury. Pt required increased rest breaks due te to increased fatigue. Pt had improved HS soft tissue extensbility following STM Localized twitch response elicited along biceps femorris. Pt to drop frequency of visits due to functional improvements and ease with daily work related tasks. Pt would benefit from continued skilled therapy to maximize functional mobility as well as prevent further decline in mobility.    Personal Factors and Comorbidities Age;Fitness;Education;Time since onset of injury/illness/exacerbation    Examination-Activity Limitations Lift;Stand;Locomotion Level;Bend;Transfers;Carry;Squat;Stairs    Examination-Participation Restrictions Occupation;Yard Work;Community Activity    Stability/Clinical Decision Making Stable/Uncomplicated    Rehab Potential Fair    PT  Frequency 1x / week    PT Duration 8 weeks   Recc 2x/week, but pt requires 1x/week due to schedule and distance to clinic   PT Treatment/Interventions ADLs/Self Care Home Management;Aquatic Therapy;Electrical Stimulation;Cryotherapy;Iontophoresis 4mg /ml Dexamethasone;Moist Heat;Ultrasound;Traction;Stair training;Therapeutic activities;Therapeutic exercise;Gait training;Balance training;Neuromuscular re-education;Patient/family education;Orthotic Fit/Training;Manual techniques;Passive range of motion;Dry needling;Taping;Spinal  Manipulations;Joint Manipulations    PT Next Visit Plan review HEP, prone knee mob, HR/TR, HS curl, lateral step up    PT Home Exercise Plan [email or web address]    Consulted and Agree with Plan of Care Patient           Patient will benefit from skilled therapeutic intervention in order to improve the following deficits and impairments:  Abnormal gait,Pain,Improper body mechanics,Decreased coordination,Decreased mobility,Increased muscle spasms,Decreased activity tolerance,Decreased endurance,Decreased range of motion,Decreased strength,Hypomobility,Impaired flexibility,Difficulty walking  Visit Diagnosis: Chronic pain of both knees  Muscle weakness (generalized)  Difficulty walking     Problem List Patient Active Problem List   Diagnosis Date Noted  . Benign localized prostatic hyperplasia with lower urinary tract symptoms (LUTS) 09/27/2020  . Wheezing 09/14/2020  . Chondrocalcinosis 03/14/2020  . Trigger ring finger of right hand 01/22/2018  . Dyslipidemia 09/23/2017  . Bilateral primary osteoarthritis of knee 09/02/2017  . Neck pain 02/15/2017  . Primary osteoarthritis of right hip 02/15/2017  . Tear of right acetabular labrum 02/15/2017  . Right leg pain 01/04/2017  . Obese 08/17/2015  . Elevated blood sugar 08/17/2015  . Shingles 09/10/2014  . Dyspnea 11/27/2011  . HERNIA, UMBILICAL 74/16/3845  . Essential hypertension 01/30/2008  . Allergic rhinitis  01/30/2008  . GERD 01/30/2008  . HEADACHE 01/30/2008   Daleen Bo PT, DPT 03/20/21 11:25 AM   [facility] [place], [place], [address] Phone: [phone]   Fax:  [phone]  Name: Frank [name] MRN: [id number] Date of Birth: 01/23/1951

## 2021-03-31 ENCOUNTER — Telehealth: Payer: Self-pay | Admitting: Family Medicine

## 2021-03-31 NOTE — Progress Notes (Signed)
  Chronic Care Management   Outreach Note  03/31/2021 Name: OVID WITMAN MRN: [id number] DOB: August 14, 1951  Referred by: Ardith Dark, MD Reason for referral : No chief complaint on file.   Third unsuccessful telephone outreach was attempted today. The patient was referred to the pharmacist for assistance with care management and care coordination.   Follow Up Plan:   Carmell Austria Upstream Scheduler

## 2021-04-03 ENCOUNTER — Other Ambulatory Visit: Payer: Self-pay

## 2021-04-03 ENCOUNTER — Encounter: Payer: Self-pay | Admitting: Physical Therapy

## 2021-04-03 ENCOUNTER — Ambulatory Visit: Payer: Medicare Other | Admitting: Physical Therapy

## 2021-04-03 DIAGNOSIS — M6281 Muscle weakness (generalized): Secondary | ICD-10-CM

## 2021-04-03 DIAGNOSIS — G8929 Other chronic pain: Secondary | ICD-10-CM

## 2021-04-03 DIAGNOSIS — M25562 Pain in left knee: Secondary | ICD-10-CM

## 2021-04-03 DIAGNOSIS — R262 Difficulty in walking, not elsewhere classified: Secondary | ICD-10-CM

## 2021-04-03 DIAGNOSIS — M25561 Pain in right knee: Secondary | ICD-10-CM | POA: Diagnosis not present

## 2021-04-03 NOTE — Patient Instructions (Signed)
Access Code: [email or web address] URL: [email or web address] Date: 04/03/2021 Prepared by: Zebedee Iba  Exercises Marching Bridge - 1 x daily - 3-4 x weekly - 3 sets - 10 reps Sit to Stand - 1 x daily - 3-4 x weekly - 3 sets - 10 reps Seated Hamstring Stretch - 2 x daily - 7 x weekly - 1 sets - 3 reps - 30 hold Side Lunge Adductor Stretch - 2 x daily - 7 x weekly - 1 sets - 3 reps - 30 hold Prone Quadriceps Stretch with Strap - 2 x daily - 7 x weekly - 1 sets - 3 reps - 30 hold Standing Gastroc Stretch on Step - 2 x daily - 7 x weekly - 1 sets - 3 reps - 30 hold Single Leg Stance - 1 x daily - 7 x weekly - 1 sets - 5 reps - 15 hold

## 2021-04-03 NOTE — Therapy (Addendum)
[facility] [address] [place], [place], [address] Phone: [phone]   Fax:  [phone]  Physical Therapy Treatment  Patient Details  Name: Frank [name] MRN: [id number] Date of Birth: December 08, 1950 Referring Provider (PT): Dr. Georgina Snell   Encounter Date: 04/03/2021   PT End of Session - 04/03/21 0810    Visit Number 7    Number of Visits 17    Date for PT Re-Evaluation 04/12/21    Authorization Type UHC Medicare    PT Start Time 0805    PT Stop Time 0845    PT Time Calculation (min) 40 min    Activity Tolerance Patient tolerated treatment well    Behavior During Therapy [facility] for tasks assessed/performed           Past Medical History:  Diagnosis Date  . Allergy   . Asthma   . GERD (gastroesophageal reflux disease)   . Headache(784.0)   . Hypertension     Past Surgical History:  Procedure Laterality Date  . [place], 2009  . kidney stones  05/20/95  . KNEE SURGERY  02/1989  . NASAL SINUS SURGERY  10/24/1992  . ROTATOR CUFF REPAIR  04/25/93    There were no vitals filed for this visit.   Subjective Assessment - 04/03/21 0807    Subjective Pt states that the knees were very sore this morning. He states that he has been climbing up into his attic more often recently. He put some gel/cream on them this morning and the pain went away. He states that things are getting better still.    How long can you sit comfortably? 30 mins    How long can you stand comfortably? 30 mins    How long can you walk comfortably? 5-10 mins    Diagnostic tests IMPRESSION:  1. Moderate tricompartmental osteoarthritis, most prominent in the  medial tibiofemoral compartment. There has been progression in  degenerative change from 2018 radiograph. Small joint effusion.  2. Chondrocalcinosis    Patient Stated Goals Pt states he would like to improve mobility, stairs, and floor to stand transfers. Prevent surgery.    Currently in Pain?  No/denies    Multiple Pain Sites No                             [facility] Adult PT Treatment/Exercise - 04/03/21 0001      Ambulation/Gait   Gait Pattern Step-through pattern;Decreased stride length;Decreased dorsiflexion - right;Decreased dorsiflexion - left;Decreased hip/knee flexion - left;Decreased hip/knee flexion - right    Stairs Yes    Stair Management Technique Alternating pattern;One rail Left    Height of Stairs 6   6 &3"     Posture/Postural Control   Posture Comments significant tibial varum      Exercises   Exercises Knee/Hip                 Other Knee/Hip Stretches adductor lunge stretch 30s 3x  each     Knee/Hip Exercises: Aerobic   Recumbent Bike L2 6 mins (2 rest breaks needed)      Knee/Hip Exercises: Standing   Other Standing Knee Exercises STS 10lbs focus on power and slow eccentric 2x10   Other Standing Knee Exercises eccentric heel drop at step 10x 5x;  HR/TR 2x10                   Knee/Hip Exercises: Supine  Bridges Limitations marching 2x10    Straight Leg Raises Limitations 2x10 2lbs                                 PT Short Term Goals - 03/13/21 0910      PT SHORT TERM GOAL #1   Title Pt will become independent with HEP in order to demonstrate synthesis of PT education.    Time 2    Period Weeks    Status Partially Met      PT SHORT TERM GOAL #2   Title Pt will have an at least 9 pt improvement in LEFS measure in order to demonstrate MCID improvement in daily function.    Time 4    Period Weeks    Status Unable to assess             PT Long Term Goals - 03/13/21 0910      PT LONG TERM GOAL #1   Title Pt will have >18 pt improvement in LEFS measure in order to demonstrate MCID improvement in daily function.    Time 6    Period Weeks    Status Unable to assess      PT LONG TERM GOAL #2   Title Pt will be able to perform 5XSTS in under 12s in order to demonstrate functional improvement above the  cut off score for adults.    Time 8    Period Weeks    Status Achieved      PT LONG TERM GOAL #3   Title Pt will be able to demonstrate full floor to stand, kneeling to stand, and stand to kneeling transfer without UE support in order to demonstrate functional improvement in LE function for ADL/house hold duties.    Time 8    Period Weeks    Status Partially Met                 Plan - 04/03/21 0904    Clinical Impression Statement Pt was able to progress ther ex and HEP intensity. Pt demonstrates single leg stability and strength deficits that are likely affecting motor control with stair climbing. Pt had endurance and cardiovascular deficits that required extended rest breaks. Pt is demonstrating good bilateral knee extensor strength but still has deficits with eccentric strengthen HEP and HEP instructions updated to reflect focus on SL eccentric stability. Plan to add step ups/down to HEP at next session. Pt would benefit from continued skilled therapy to maximize functional mobility as well as prevent further decline in mobility.    Personal Factors and Comorbidities Age;Fitness;Education;Time since onset of injury/illness/exacerbation    Examination-Activity Limitations Lift;Stand;Locomotion Level;Bend;Transfers;Carry;Squat;Stairs    Examination-Participation Restrictions Occupation;Yard Work;Community Activity    Stability/Clinical Decision Making Stable/Uncomplicated    Rehab Potential Fair    PT Frequency 1x / week    PT Duration 8 weeks   Recc 2x/week, but pt requires 1x/week due to schedule and distance to clinic   PT Treatment/Interventions ADLs/Self Care Home Management;Aquatic Therapy;Electrical Stimulation;Cryotherapy;Iontophoresis 70m/ml Dexamethasone;Moist Heat;Ultrasound;Traction;Stair training;Therapeutic activities;Therapeutic exercise;Gait training;Balance training;Neuromuscular re-education;Patient/family education;Orthotic Fit/Training;Manual techniques;Passive range  of motion;Dry needling;Taping;Spinal Manipulations;Joint Manipulations    PT Next Visit Plan review HEP, step up, LAQ, review bridge march    PT Home Exercise Plan [email or web address]    Consulted and Agree with Plan of Care Patient           Patient will benefit from skilled therapeutic intervention in order  to improve the following deficits and impairments:  Abnormal gait,Pain,Improper body mechanics,Decreased coordination,Decreased mobility,Increased muscle spasms,Decreased activity tolerance,Decreased endurance,Decreased range of motion,Decreased strength,Hypomobility,Impaired flexibility,Difficulty walking  Visit Diagnosis: Chronic pain of both knees  Muscle weakness (generalized)  Difficulty walking     Problem List Patient Active Problem List   Diagnosis Date Noted  . Benign localized prostatic hyperplasia with lower urinary tract symptoms (LUTS) 09/27/2020  . Wheezing 09/14/2020  . Chondrocalcinosis 03/14/2020  . Trigger ring finger of right hand 01/22/2018  . Dyslipidemia 09/23/2017  . Bilateral primary osteoarthritis of knee 09/02/2017  . Neck pain 02/15/2017  . Primary osteoarthritis of right hip 02/15/2017  . Tear of right acetabular labrum 02/15/2017  . Right leg pain 01/04/2017  . Obese 08/17/2015  . Elevated blood sugar 08/17/2015  . Shingles 09/10/2014  . Dyspnea 11/27/2011  . HERNIA, UMBILICAL 92/44/6286  . Essential hypertension 01/30/2008  . Allergic rhinitis 01/30/2008  . GERD 01/30/2008  . HEADACHE 01/30/2008    Daleen Bo PT, DPT 04/03/21 9:30 AM   [facility] [place], [place], [address] Phone: [phone]   Fax:  [phone]  Name: Frank [name] MRN: [id number] Date of Birth: Jul 18, 1951   Access Code: [email or web address] URL: [email or web address] Date: 04/03/2021 Prepared by: Daleen Bo  Exercises Marching Bridge - 1 x daily - 3-4 x weekly - 3 sets - 10 reps Sit to Stand - 1 x  daily - 3-4 x weekly - 3 sets - 10 reps Seated Hamstring Stretch - 2 x daily - 7 x weekly - 1 sets - 3 reps - 30 hold Side Lunge Adductor Stretch - 2 x daily - 7 x weekly - 1 sets - 3 reps - 30 hold Prone Quadriceps Stretch with Strap - 2 x daily - 7 x weekly - 1 sets - 3 reps - 30 hold Standing Gastroc Stretch on Step - 2 x daily - 7 x weekly - 1 sets - 3 reps - 30 hold Single Leg Stance - 1 x daily - 7 x weekly - 1 sets - 5 reps - 15 hold

## 2021-04-12 DIAGNOSIS — H52223 Regular astigmatism, bilateral: Secondary | ICD-10-CM | POA: Diagnosis not present

## 2021-04-12 DIAGNOSIS — H16401 Unspecified corneal neovascularization, right eye: Secondary | ICD-10-CM | POA: Diagnosis not present

## 2021-04-12 DIAGNOSIS — H524 Presbyopia: Secondary | ICD-10-CM | POA: Diagnosis not present

## 2021-04-12 DIAGNOSIS — H5213 Myopia, bilateral: Secondary | ICD-10-CM | POA: Diagnosis not present

## 2021-04-19 ENCOUNTER — Encounter: Payer: Medicare Other | Admitting: Physical Therapy

## 2021-04-20 ENCOUNTER — Encounter: Payer: Self-pay | Admitting: Physician Assistant

## 2021-04-20 ENCOUNTER — Other Ambulatory Visit: Payer: Self-pay

## 2021-04-20 ENCOUNTER — Ambulatory Visit (INDEPENDENT_AMBULATORY_CARE_PROVIDER_SITE_OTHER): Payer: Medicare Other

## 2021-04-20 ENCOUNTER — Ambulatory Visit (INDEPENDENT_AMBULATORY_CARE_PROVIDER_SITE_OTHER): Payer: Medicare Other | Admitting: Physician Assistant

## 2021-04-20 VITALS — BP 124/72 | HR 62 | Temp 97.0°F | Ht 62.0 in | Wt 187.0 lb

## 2021-04-20 DIAGNOSIS — M25571 Pain in right ankle and joints of right foot: Secondary | ICD-10-CM

## 2021-04-20 DIAGNOSIS — R0781 Pleurodynia: Secondary | ICD-10-CM

## 2021-04-20 DIAGNOSIS — R937 Abnormal findings on diagnostic imaging of other parts of musculoskeletal system: Secondary | ICD-10-CM | POA: Diagnosis not present

## 2021-04-20 LAB — COMPREHENSIVE METABOLIC PANEL
ALT: 13 U/L (ref 0–53)
AST: 14 U/L (ref 0–37)
Albumin: 4 g/dL (ref 3.5–5.2)
Alkaline Phosphatase: 49 U/L (ref 39–117)
BUN: 25 mg/dL — ABNORMAL HIGH (ref 6–23)
CO2: 29 mEq/L (ref 19–32)
Calcium: 9.5 mg/dL (ref 8.4–10.5)
Chloride: 105 mEq/L (ref 96–112)
Creatinine, Ser: 0.87 mg/dL (ref 0.40–1.50)
GFR: 87.96 mL/min (ref >60.00)
Glucose, Bld: 112 mg/dL — ABNORMAL HIGH (ref 70–99)
Potassium: 3.8 mEq/L (ref 3.5–5.1)
Sodium: 141 mEq/L (ref 135–145)
Total Bilirubin: 0.5 mg/dL (ref 0.2–1.2)
Total Protein: 6.7 g/dL (ref 6.0–8.3)

## 2021-04-20 LAB — URIC ACID: Uric Acid, Serum: 5.1 mg/dL (ref 4.0–7.8)

## 2021-04-20 LAB — SEDIMENTATION RATE: Sed Rate: 24 mm/hr — ABNORMAL HIGH (ref 0–20)

## 2021-04-20 MED ORDER — PREDNISONE 50 MG PO TABS: ORAL_TABLET | ORAL | 0 refills | Status: DC

## 2021-04-20 NOTE — Progress Notes (Signed)
Acute Office Visit  Subjective:    Patient ID: Frank [name], male    DOB: 1951-09-27, 70 y.o.   MRN: [id number]  Chief Complaint  Patient presents with   Ankle Pain   Chest Pain    Right Rib Pain x 4 days    HPI Patient is in today for right ankle pain x several weeks, worse in the evenings. Pain shoots up the side of the leg. He ambulates fine, but occasionally has sharp pain. No known injury. No prior issues with this ankle. He does have a history of osteoarthritis, mostly in his knees. He has been taking ibuprofen and using ice, which does help some. No pain currently in office.   He also complains of right lower rib pain. No known injury, but states it is where he previously had rib fracture 11/12/20. Worsens with sneezing or deep breath.   He is here with his wife today.   Past Medical History:  Diagnosis Date   Allergy    Asthma    GERD (gastroesophageal reflux disease)    Headache(784.0)    Hypertension     Past Surgical History:  Procedure Laterality Date   HERNIA REPAIR  1969, 2009   kidney stones  05/20/95   KNEE SURGERY  02/1989   NASAL SINUS SURGERY  10/24/1992   ROTATOR CUFF REPAIR  04/25/93    Family History  Problem Relation Age of Onset   Kidney disease Mother    COPD Father    Pneumonia Father    Cardiomyopathy Brother    Hypertension Sister    Heart disease Brother    Hypertension Brother    Hypertension Brother    Hypertension Sister    Diabetes Sister    Osteoarthritis Sister     Social History   Socioeconomic History   Marital status: Married    Spouse name: Not on file   Number of children: Not on file   Years of education: Not on file   Highest education level: Not on file  Occupational History   Not on file  Tobacco Use   Smoking status: Former    Pack years: 0.00   Smokeless tobacco: Never   Tobacco comments:    quit in 1972  Vaping Use   Vaping Use: Not on file  Substance and Sexual Activity   Alcohol use: No   Drug  use: No   Sexual activity: Not on file  Other Topics Concern   Not on file  Social History Narrative   Not on file   Social Determinants of Health   Financial Resource Strain: Not on file  Food Insecurity: Not on file  Transportation Needs: Not on file  Physical Activity: Not on file  Stress: Not on file  Social Connections: Not on file  Intimate Partner Violence: Not on file    Outpatient Medications Prior to Visit  Medication Sig Dispense Refill   acetaminophen (TYLENOL) 325 MG tablet Take 325 mg by mouth.     albuterol (VENTOLIN HFA) 108 (90 Base) MCG/ACT inhaler Inhale 2 puffs into the lungs every 6 (six) hours as needed for wheezing or shortness of breath. 8 g 0   ascorbic acid (VITAMIN C) 500 MG tablet Take 500 mg by mouth daily.     azelastine (ASTELIN) 0.1 % nasal spray Place 2 sprays into both nostrils 2 (two) times daily. 30 mL 12   diclofenac (VOLTAREN) 75 MG EC tablet TAKE 1 TABLET BY MOUTH TWICE A DAY 60  tablet 5   diclofenac sodium (VOLTAREN) 1 % GEL Apply topically to affected area qid 100 g 1   levofloxacin (LEVAQUIN) 500 MG tablet Take 1 tablet (500 mg total) by mouth daily. 28 tablet 0   lisinopril-hydrochlorothiazide (ZESTORETIC) 20-25 MG tablet TAKE 1 TABLET BY MOUTH EVERY DAY 90 tablet 1   tamsulosin (FLOMAX) 0.4 MG CAPS capsule Take 1 capsule (0.4 mg total) by mouth daily. 30 capsule 3   Zinc 50 MG CAPS Take by mouth.     naproxen (NAPROSYN) 500 MG tablet Take 1 tablet (500 mg total) by mouth 2 (two) times daily. 30 tablet 0   gabapentin (NEURONTIN) 300 MG capsule Take 1 capsule (300 mg total) by mouth at bedtime as needed (nerve pain). 90 capsule 1   No facility-administered medications prior to visit.    Allergies  Allergen Reactions   Atorvastatin Other (See Comments)    Joint pain   Bactrim [Sulfamethoxazole-Trimethoprim] Hives    Review of Systems REFER TO HPI FOR PERTINENT POSITIVES AND NEGATIVES     Objective:    Physical Exam Vitals and  nursing note reviewed.  Constitutional:      General: He is not in acute distress.    Appearance: He is obese.  Chest:     Chest wall: Tenderness (anterior chest around 10th rib. No obvious trauma. No rash.) present.  Musculoskeletal:     Right ankle: Swelling present. No deformity or ecchymosis. Tenderness present over the lateral malleolus. Normal range of motion. Anterior drawer test negative. Normal pulse.     Right Achilles Tendon: Normal.  Neurological:     Mental Status: He is alert.    BP 124/72   Pulse 62   Temp (!) 97 F (36.1 C)   Ht 5\' 2"  (1.575 m)   Wt 187 lb (84.8 kg)   BMI 34.20 kg/m  Wt Readings from Last 3 Encounters:  04/20/21 187 lb (84.8 kg)  01/30/21 191 lb 9.6 oz (86.9 kg)  09/27/20 188 lb (85.3 kg)    Health Maintenance Due  Topic Date Due   COVID-19 Vaccine (1) Never done   Hepatitis C Screening  Never done   Zoster Vaccines- Shingrix (1 of 2) Never done   COLONOSCOPY (Pts 45-24yrs Insurance coverage will need to be confirmed)  Never done    There are no preventive care reminders to display for this patient.   Lab Results  Component Value Date   TSH 1.13 03/07/2020   Lab Results  Component Value Date   WBC 8.6 03/07/2020   HGB 13.9 03/07/2020   HCT 41.0 03/07/2020   MCV 94.2 03/07/2020   PLT 224.0 03/07/2020   Lab Results  Component Value Date   NA 139 03/07/2020   K 4.2 03/07/2020   CO2 28 03/07/2020   GLUCOSE 108 (H) 03/07/2020   BUN 27 (H) 03/07/2020   CREATININE 0.91 03/07/2020   BILITOT 0.4 03/07/2020   ALKPHOS 45 03/07/2020   AST 14 03/07/2020   ALT 14 03/07/2020   PROT 6.5 03/07/2020   ALBUMIN 4.2 03/07/2020   CALCIUM 9.5 03/07/2020   GFR 82.72 03/07/2020   Lab Results  Component Value Date   CHOL 185 05/13/2017   Lab Results  Component Value Date   HDL 36.00 (L) 05/13/2017   Lab Results  Component Value Date   LDLCALC 116 (H) 05/13/2017   Lab Results  Component Value Date   TRIG 166.0 (H) 05/13/2017    Lab Results  Component Value Date  CHOLHDL 5 05/13/2017   Lab Results  Component Value Date   HGBA1C 6.0 05/13/2017       Assessment & Plan:   Problem List Items Addressed This Visit   None Visit Diagnoses     Acute right ankle pain    -  Primary   Relevant Medications   predniSONE (DELTASONE) 50 MG tablet   Other Relevant Orders   Uric acid   Sedimentation rate   DG Ankle Complete Right   Comprehensive metabolic panel   Rib pain on right side       Relevant Medications   predniSONE (DELTASONE) 50 MG tablet        Meds ordered this encounter  Medications   predniSONE (DELTASONE) 50 MG tablet    Sig: Take one tablet po qd x 5 days with food.    Dispense:  5 tablet    Refill:  0   1. Acute right ankle pain ?Gout vs osteoarthritis. Will check labs and XRAY today. Prednisone to help with inflammation.  2. Rib pain on right side Likely strain. No new injury. Prednisone should help this area as well. Small pillow when coughing or sneezing. Re-XRAY if worse or no improvement.    Jozalyn Baglio M Arali Somera, PA-C

## 2021-04-20 NOTE — Patient Instructions (Signed)
Please go to the lab for an XRAY of the right ankle and labs, this will help [place] determine if you may have gout or arthritis and also rule out other issues.  Activity as tolerated. Take the prednisone as directed (in the mornings with food). Continue to rest and use ice.

## 2021-04-24 NOTE — Progress Notes (Signed)
I, Frank [name], LAT, ATC, am serving as scribe for Dr. Clementeen [name].  Frank [name] is a 70 y.o. male who presents to [facility] today for f/u of R ankle pain due to a recent R distal tibia avulsion fx.  He was seen at [facility] for R ankle pain and had a R ankle XR on 04/20/21.  He was last seen by Dr. Denyse [name] on 03/14/20 for [name] pain.  Today, pt reports several week hx of R ankle pain w/ no known MOI.  Radiating pain: yes up his R lateral lower leg R ankle swelling: yes along his ant ankle and med/lat malleoli Aggravating factors: resting after being up on his feet; R ankle eversion Treatments tried: IBU; ice; prednisone;    Pertinent review of systems: No fevers or chills  Relevant historical information: Previously has done well with PT for MSK back pain and knee pain.   Exam:  BP (!) 150/100 (BP Location: Right Arm, Patient Position: Sitting, Cuff Size: Normal)   Pulse 66   Ht 5\' 2"  (1.575 m)   Wt 192 lb (87.1 kg)   SpO2 97%   BMI 35.12 kg/m  General: Well Developed, well nourished, and in no acute distress.   MSK: Right ankle swollen anterior and lateral ankle. Normal ankle motion. Nontender anterior ankle.  Nontender medial ankle. Tender palpation just posterior to the lateral malleolus along course of peroneal tendons. Strength intact with exception of resisted eversion which is diminished. Ligament exam testing normal. Pulses cap refill and sensation are intact distally.     Lab and Radiology Results  DG Ankle Complete Right  Result Date: 04/20/2021 CLINICAL DATA:  Right ankle pain for 2 weeks, no known injury, initial encounter EXAM: RIGHT ANKLE - COMPLETE 3+ VIEW COMPARISON:  None. FINDINGS: Generalized soft tissue swelling is noted most prominent anteriorly. There is a lucency along the anterior aspect of the distal tibia seen only on the lateral projection which may represent a small avulsion fracture. Calcaneal spurring is seen.  Tarsal degenerative changes are noted. IMPRESSION: Findings suggestive of small avulsion from the anterior aspect of the distal tibia. Electronically Signed   By: 04/22/2021 M.D.   On: 04/20/2021 23:11   I, 04/22/2021, personally (independently) visualized and performed the interpretation of the images attached in this note.   Diagnostic Limited MSK Ultrasound of: Right lateral ankle Peroneal brevis tendon appears to be degenerative with partial longitudinal split tear at the lateral malleolus and into the midfoot near the insertion onto the proximal fifth metatarsal.  No full-thickness retracted tear visible. Impression: Peroneal tendinitis with partial tendon tear.      Assessment and Plan: 70 y.o. male with right ankle pain.  Despite appearance of x-ray suggesting an anterior avulsion fracture patient does not have pain in this area.  Dominant and clinical exam finding and ultrasound exam finding is peroneal tendinitis and ultrasound exam findings showing partial tear of the peroneal brevis tendon.  Plan to treat with compression home exercise program Voltaren gel and referral back to PT.  Recheck in 1 month.   PDMP not reviewed this encounter. Orders Placed This Encounter  Procedures   78 LIMITED JOINT SPACE STRUCTURES LOW RIGHT(NO LINKED CHARGES)    Order Specific Question:   Reason for Exam (SYMPTOM  OR DIAGNOSIS REQUIRED)    Answer:   R ankle pain    Order Specific Question:   Preferred imaging location?    Answer:  [facility] referral to Physical Therapy    Referral Priority:   Routine    Referral Type:   Physical Medicine    Referral Reason:   Specialty Services Required    Requested Specialty:   Physical Therapy    Number of Visits Requested:   1   No orders of the defined types were placed in this encounter.    Discussed warning signs or symptoms. Please see discharge instructions. Patient expresses understanding.   The above  documentation has been reviewed and is accurate and complete Frank [name], M.D.

## 2021-04-25 ENCOUNTER — Encounter: Payer: Self-pay | Admitting: Family Medicine

## 2021-04-25 ENCOUNTER — Ambulatory Visit: Payer: Self-pay

## 2021-04-25 ENCOUNTER — Ambulatory Visit: Payer: Medicare Other | Admitting: Family Medicine

## 2021-04-25 ENCOUNTER — Other Ambulatory Visit: Payer: Self-pay

## 2021-04-25 VITALS — BP 150/100 | HR 66 | Ht 62.0 in | Wt 192.0 lb

## 2021-04-25 DIAGNOSIS — M25571 Pain in right ankle and joints of right foot: Secondary | ICD-10-CM

## 2021-04-25 DIAGNOSIS — M7671 Peroneal tendinitis, right leg: Secondary | ICD-10-CM | POA: Diagnosis not present

## 2021-04-25 NOTE — Patient Instructions (Signed)
Thank you for coming in today.   I recommend you obtained a compression sleeve to help with your joint problems. There are many options on the market however I recommend obtaining a full ankle Body Helix compression sleeve.  You can find information (including how to appropriate measure yourself for sizing) can be found at www.[employer].ch.  Many of these products are health savings account (HSA) eligible.   You can use the compression sleeve at any time throughout the day but is most important to use while being active as well as for 2 hours post-activity.   It is appropriate to ice following activity with the compression sleeve in place.   Plan for PT.   Please use Voltaren gel (Generic Diclofenac Gel) up to 4x daily for pain as needed.  This is available over-the-counter as both the name brand Voltaren gel and the generic diclofenac gel.   Recheck in 1 month.   Peroneal Tendinopathy Rehab Ask your health care provider which exercises are safe for you. Do exercises exactly as told by your health care provider and adjust them as directed. It is normal to feel mild stretching, pulling, tightness, or discomfort as you do these exercises. Stop right away if you feel sudden pain or your pain gets worse. Do not begin these exercises until told by your health care provider. Stretching and range-of-motion exercises These exercises warm up your muscles and joints and improve the movement and flexibility of your ankle. These exercises also help to relieve pain andstiffness. Gastroc and soleus stretch, standing This is an exercise in which you stand on a step and use your body weight to stretch your calf muscles. To do this exercise: Stand on the edge of a step on the ball of your left / right foot. The ball of your foot is on the walking surface, right under your toes. Keep your other foot firmly on the same step. Hold on to the wall, a railing, or a chair for balance. Slowly lift your other foot,  allowing your body weight to press your left / right heel down over the edge of the step. You should feel a stretch in your left / right calf (gastrocnemius and soleus). Hold this position for __________ seconds. Return both feet to the step. Repeat this exercise with a slight bend in your left / right knee. Repeat __________ times with your left / right knee straight and __________ times with your left / right knee bent. Complete this exercise __________ timesa day. Strengthening exercises These exercises build strength and endurance in your foot and ankle. Enduranceis the ability to use your muscles for a long time, even after they get tired. Ankle dorsiflexion with band  Secure a rubber exercise band or tube to an object, such as a table leg, that will not move when the band is pulled. Secure the other end of the band around your left / right foot. Sit on the floor, facing the object with your left / right leg extended. The band or tube should be slightly tense when your foot is relaxed. Slowly flex your left / right ankle and toes to bring your foot toward you (dorsiflexion). Hold this position for __________ seconds. Let the band or tube slowly pull your foot back to the starting position. Repeat __________ times. Complete this exercise __________ times a day. Ankle eversion Sit on the floor with your legs straight out in front of you. Loop a rubber exercise band or tube around the ball of  your left / right foot. The ball of your foot is on the walking surface, right under your toes. Hold the ends of the band in your hands, or secure the band to a stable object. The band or tube should be slightly tense when your foot is relaxed. Slowly push your foot outward, away from your other leg (eversion). Hold this position for __________ seconds. Slowly return your foot to the starting position. Repeat __________ times. Complete this exercise __________ times a day. Plantar flexion,  standing This exercise is sometimes called standing heel raise. Stand with your feet shoulder-width apart. Place your hands on a wall or table to steady yourself as needed, but try not to use it for support. Keep your weight spread evenly over the width of your feet while you slowly rise up on your toes (plantar flexion). If told by your health care provider: Shift your weight toward your left / right leg until you feel challenged. Stand on your left / right leg only. Hold this position for __________ seconds. Repeat __________ times. Complete this exercise __________ times a day. Single leg stand Without shoes, stand near a railing or in a doorway. You may hold on to the railing or door frame as needed. Stand on your left / right foot. Keep your big toe down on the floor and try to keep your arch lifted. Do not roll to the outside of your foot. If this exercise is too easy, you can try it with your eyes closed or while standing on a pillow. Hold this position for __________ seconds. Repeat __________ times. Complete this exercise __________ times a day. This information is not intended to replace advice given to you by your health care provider. Make sure you discuss any questions you have with your healthcare provider. Document Revised: 02/03/2019 Document Reviewed: 02/03/2019 Elsevier Patient Education  2022 [employer].

## 2021-04-26 ENCOUNTER — Encounter: Payer: Self-pay | Admitting: Physical Therapy

## 2021-04-26 ENCOUNTER — Ambulatory Visit: Payer: Medicare Other | Admitting: Physical Therapy

## 2021-04-26 DIAGNOSIS — M6281 Muscle weakness (generalized): Secondary | ICD-10-CM | POA: Diagnosis not present

## 2021-04-26 DIAGNOSIS — G8929 Other chronic pain: Secondary | ICD-10-CM | POA: Diagnosis not present

## 2021-04-26 DIAGNOSIS — M25562 Pain in left knee: Secondary | ICD-10-CM | POA: Diagnosis not present

## 2021-04-26 DIAGNOSIS — M25571 Pain in right ankle and joints of right foot: Secondary | ICD-10-CM

## 2021-04-26 DIAGNOSIS — M25561 Pain in right knee: Secondary | ICD-10-CM

## 2021-04-26 DIAGNOSIS — R262 Difficulty in walking, not elsewhere classified: Secondary | ICD-10-CM

## 2021-04-26 NOTE — Therapy (Signed)
[facility] [address] [address] [place], [place], [id number] Phone: [phone]   Fax:  [phone]  Physical Therapy Re-Evaluation  Patient Details  Name: Frank [name] MRN: [id number] Date of Birth: 02/02/1951 Referring Provider (PT): Dr. Denyse Amassorey   Encounter Date: 04/26/2021   PT End of Session - 04/26/21 0818     Visit Number 8    Number of Visits 20    Date for PT Re-Evaluation 07/25/21    Authorization Type [employer] Medicare    PT Start Time 0803    PT Stop Time 0850    PT Time Calculation (min) 47 min    Activity Tolerance Patient tolerated treatment well    Behavior During Therapy [facility] for tasks assessed/performed             Past Medical History:  Diagnosis Date   Allergy    Asthma    GERD (gastroesophageal reflux disease)    Headache(784.0)    Hypertension     Past Surgical History:  Procedure Laterality Date   HERNIA REPAIR  1969, 2009   kidney stones  05/20/95   KNEE SURGERY  02/1989   NASAL SINUS SURGERY  10/24/1992   ROTATOR CUFF REPAIR  04/25/93        Subjective Assessment - 04/26/21 0805     Subjective Pt stats the knees are doing well but he recently started having R rib and R ankle pain for a few weeks. The only MOI he recognizes was moving shingles from his yard. He states he flared up the rib and ankle. The R ankle is what he has come in for today. R ankle is not bothered by IV and PF or DF. Aggs:turning foot out, walking longer distances; Eases: icing; Pt denies NT. Pt states no recent redness but does have fairly constant swelling. Current 2/10, Worst 5/10    How long can you sit comfortably? --    How long can you stand comfortably? --    How long can you walk comfortably? 5-10 min    Diagnostic tests R ankle imaging; IMPRESSION:  Findings suggestive of small avulsion from the anterior aspect of  the distal tibia; Chest Xray IMPRESSION:  Possible mildly displaced lateral right tenth rib fracture. No   pneumothorax or pleural effusion.    Patient Stated Goals Pt states he would like to improve mobility, stairs, and floor to stand transfers.    Currently in Pain? Yes    Pain Score 2     Pain Location Ankle    Pain Orientation Right    Pain Descriptors / Indicators Aching;Sore                [facility] PT Assessment - 04/26/21 0001       Assessment   Medical Diagnosis R ankle pain    Referring Provider (PT) Dr. Denyse Amassorey    Prior Therapy R shoulder      Precautions   Precautions None      Restrictions   Weight Bearing Restrictions No      Balance Screen   Has the patient fallen in the past 6 months Yes    How many times? 1   previously documented, tripped while carrying heavy furniture    Has the patient had a decrease in activity level because of a fear of falling?  No    Is the patient reluctant to leave their home because of a fear of falling?  No      Home Environment  Living Environment Private residence    Living Arrangements Spouse/significant other      Prior Function   Level of Independence Independent      Cognition   Overall Cognitive Status Within Functional Limits for tasks assessed      Observation/Other Assessments   Other Surveys  Lower Extremity Functional Scale    Lower Extremity Functional Scale  54/80 67.5%      Functional Tests   Functional tests Squat;Step up;Sit to Stand;Floor to Stand      Squat   Comments unable to squat due to ankle pain      Step Up   Comments UE support needed, decreased eccentric control, increased pronation with weight acceptance      Sit to Stand   Comments WNL from standard height chair      Posture/Postural Control   Posture Comments significant tibial varum      AROM   Overall AROM Comments R ankle: DF 5 deg, PF 50 deg, IV 30 EV 15 deg with pain: L WFL      PROM   Overall PROM Comments R ankle WFL, pain at end range      Strength   Overall Strength Comments R ankle 14 bilat calf raises before pain in the  R, pain with resisted EV and DF 4/5     Special Tests    Special Tests Ankle/Foot Special Tests    Ankle/Foot Special Tests  Anterior Drawer Test;Talar Tilt Test      Anterior Drawer Test   Findings Negative      Talar Tilt Test    Findings Negative      Ambulation/Gait   Gait Pattern Step-through pattern;Decreased dorsiflexion - right;Decreased dorsiflexion - left;Decreased stride length;Antalgic    Stairs Yes    Stair Management Technique Alternating pattern;One rail Left      Balance   Balance Assessed Yes      Static Standing Balance   Static Standing Balance -  Activities  Tandam Stance - Left Leg;Tandam Stance - Right Leg;Single Leg Stance - Right Leg;Single Leg Stance - Left Leg    Static Standing - Comment/# of Minutes pain with SLS, <5s on R, 9s on L                        Objective measurements completed on examination: See above findings.       [facility] Adult PT Treatment/Exercise - 04/26/21 0001       Exercises   Exercises Ankle      Manual Therapy   Soft tissue mobilization STM R proximal fibularis group      Ankle Exercises: Stretches   Other Stretch ankle EV gentle stretch in standing or seated 30s 3x      Ankle Exercises: Standing   Other Standing Ankle Exercises woodpecker at wall 10x 3s hold      Ankle Exercises: Seated   Other Seated Ankle Exercises ankle seated EV with YTB 1s concentric 2s eccentric                    PT Education - 04/26/21 0817     Education Details MOI, diagnosis, prognosis, ankle anatomy, exercise progression, DOMS expectations, muscle firing, HEP, joint protection, POC    Person(s) Educated Patient;Spouse    Methods Explanation;Demonstration;Tactile cues;Verbal cues;Handout    Comprehension Verbalized understanding;Returned demonstration;Verbal cues required;Tactile cues required              PT Short Term  Goals - 04/26/21 0952       PT SHORT TERM GOAL #1   Title Pt will become  independent with HEP in order to demonstrate synthesis of PT education.    Time 2    Period Weeks    Status New      PT SHORT TERM GOAL #2   Title Pt will have an at least 9 pt improvement in LEFS measure in order to demonstrate MCID improvement in daily function.    Time 4    Period Weeks    Status New               PT Long Term Goals - 04/26/21 0953       PT LONG TERM GOAL #1   Title Pt will have >18 pt improvement in LEFS measure in order to demonstrate MCID improvement in daily function.    Time 6    Period Weeks    Status New      PT LONG TERM GOAL #2   Title Pt will demonstrate/report ability to stand at workbench for extended periods of time without pain in order to demonstrate functional improvement and tolerance to static positioning.    Time 6    Period Weeks    Status Achieved      PT LONG TERM GOAL #3   Title Pt will be able to demonstrate full floor to stand, kneeling to stand, and stand to kneeling transfer without UE support in order to demonstrate functional improvement in LE function for ADL/house hold duties.    Time 6    Period Weeks    Status New                    Plan - 04/26/21 0818     Clinical Impression Statement Pt presents to PT session today for eval of acute R ankle pain. Pt's knees have improved and pt would like to focus on new R ankle pain. Pt demonstrates decreased R ankle ROM, muscle strength, and dynamic stability with SLS in gait and in static standing. Pt's s/s appear consistent with R fibularis group spasm and tendinopathy due to likely overuse related injury. Clinical testing does not suggest ligamentous damage. Pt's symptoms are mildly irritable and sensitive. Pt responded very well to [facility] and had abolishment of pain following treatment. 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 New ankle HEP provided. Pt would benefit from continued skilled therapy to  maximize functional mobility as well as prevent further decline in mobility.    Personal Factors and Comorbidities Age;Fitness;Education;Time since onset of injury/illness/exacerbation    Examination-Activity Limitations Lift;Stand;Locomotion Level;Bend;Transfers;Carry;Squat;Stairs    Examination-Participation Restrictions Occupation;Yard Work;Community Activity    Stability/Clinical Decision Making Stable/Uncomplicated    Clinical Decision Making Low    Rehab Potential Good    PT Frequency 1x / week    PT Duration 6 weeks   Recc 2x/week, but pt requires 1x/week due to schedule and distance to clinic   PT Treatment/Interventions ADLs/Self Care Home Management;Aquatic Therapy;Electrical Stimulation;Cryotherapy;Iontophoresis 4mg /ml Dexamethasone;Moist Heat;Ultrasound;Traction;Stair training;Therapeutic activities;Therapeutic exercise;Gait training;Balance training;Neuromuscular re-education;Patient/family education;Orthotic Fit/Training;Manual techniques;Passive range of motion;Dry needling;Taping;Spinal Manipulations;Joint Manipulations    PT Next Visit Plan review HEP, STM R fibularis, ankle mobs, progress R ankle strength and stability    PT Home Exercise Plan [id number]- knee; - ankle    Consulted and Agree with Plan of Care Patient;Family member/caregiver    Family Member Consulted Spouse  Patient will benefit from skilled therapeutic intervention in order to improve the following deficits and impairments:  Abnormal gait, Pain, Improper body mechanics, Decreased coordination, Decreased mobility, Increased muscle spasms, Decreased activity tolerance, Decreased endurance, Decreased range of motion, Decreased strength, Hypomobility, Impaired flexibility, Difficulty walking  Visit Diagnosis: Acute right ankle pain - Plan: PT plan of care cert/re-cert  Chronic pain of both knees - Plan: PT plan of care cert/re-cert  Muscle weakness (generalized) - Plan: PT plan of care  cert/re-cert  Difficulty walking - Plan: PT plan of care cert/re-cert     Problem List Patient Active Problem List   Diagnosis Date Noted   Peroneal tendinitis of right lower extremity 04/25/2021   Benign localized prostatic hyperplasia with lower urinary tract symptoms (LUTS) 09/27/2020   Wheezing 09/14/2020   Chondrocalcinosis 03/14/2020   Trigger ring finger of right hand 01/22/2018   Dyslipidemia 09/23/2017   Bilateral primary osteoarthritis of knee 09/02/2017   Neck pain 02/15/2017   Primary osteoarthritis of right hip 02/15/2017   Tear of right acetabular labrum 02/15/2017   Right leg pain 01/04/2017   Obese 08/17/2015   Elevated blood sugar 08/17/2015   Shingles 09/10/2014   Dyspnea 11/27/2011   HERNIA, UMBILICAL 08/02/2008   Essential hypertension 01/30/2008   Allergic rhinitis 01/30/2008   GERD 01/30/2008   HEADACHE 01/30/2008   Zebedee Iba PT, DPT 04/26/21 10:02 AM   [facility] [address] [place], [place], [address] Phone: [phone]   Fax:  [phone]  Name: Frank [name] MRN: [id number] Date of Birth: 09-30-1951

## 2021-04-26 NOTE — Patient Instructions (Signed)
Access Code: [email or web address] URL: [email or web address] Date: 04/26/2021 Prepared by: Zebedee Iba  Exercises Woodpeckers Two Legs - 1 x daily - 7 x weekly - 2 sets - 10 reps - 3 hold Long Sitting Ankle Eversion with Resistance - 2 x daily - 7 x weekly - 1 sets - 10 reps - 1 hold Seated Ankle Inversion Eversion PROM - 2 x daily - 7 x weekly - 1 sets - 3 reps - 30 hold

## 2021-04-27 ENCOUNTER — Other Ambulatory Visit: Payer: Self-pay | Admitting: Family Medicine

## 2021-05-02 ENCOUNTER — Encounter: Payer: Medicare Other | Admitting: Physical Therapy

## 2021-05-03 ENCOUNTER — Other Ambulatory Visit: Payer: Self-pay

## 2021-05-03 ENCOUNTER — Ambulatory Visit (HOSPITAL_BASED_OUTPATIENT_CLINIC_OR_DEPARTMENT_OTHER): Payer: Medicare Other | Attending: Family Medicine | Admitting: Physical Therapy

## 2021-05-03 ENCOUNTER — Encounter (HOSPITAL_BASED_OUTPATIENT_CLINIC_OR_DEPARTMENT_OTHER): Payer: Self-pay | Admitting: Physical Therapy

## 2021-05-03 DIAGNOSIS — M25562 Pain in left knee: Secondary | ICD-10-CM | POA: Diagnosis not present

## 2021-05-03 DIAGNOSIS — R262 Difficulty in walking, not elsewhere classified: Secondary | ICD-10-CM | POA: Diagnosis not present

## 2021-05-03 DIAGNOSIS — M25571 Pain in right ankle and joints of right foot: Secondary | ICD-10-CM | POA: Insufficient documentation

## 2021-05-03 DIAGNOSIS — M25561 Pain in right knee: Secondary | ICD-10-CM | POA: Insufficient documentation

## 2021-05-03 DIAGNOSIS — M6281 Muscle weakness (generalized): Secondary | ICD-10-CM | POA: Insufficient documentation

## 2021-05-03 NOTE — Therapy (Signed)
[facility] [address] [place], [place], [address] Phone: [phone]   Fax:  [phone]  Physical Therapy Treatment  Patient Details  Name: Frank [name] MRN: [id number] Date of Birth: December 10, 1950 Referring Provider (PT): Dr. Denyse Amass   Encounter Date: 05/03/2021   PT End of Session - 05/03/21 0902     Visit Number 9    Number of Visits 20    Date for PT Re-Evaluation 07/25/21    Authorization Type [employer] Medicare    PT Start Time 0800    PT Stop Time 0850    PT Time Calculation (min) 50 min    Activity Tolerance Patient tolerated treatment well    Behavior During Therapy [facility] for tasks assessed/performed             Past Medical History:  Diagnosis Date   Allergy    Asthma    GERD (gastroesophageal reflux disease)    Headache(784.0)    Hypertension     Past Surgical History:  Procedure Laterality Date   HERNIA REPAIR  1969, 2009   kidney stones  05/20/95   KNEE SURGERY  02/1989   NASAL SINUS SURGERY  10/24/1992   ROTATOR CUFF REPAIR  04/25/93    There were no vitals filed for this visit.   Subjective Assessment - 05/03/21 0802     Subjective Pt states the ankle/foot is better. It will still bother him with walking/staying on it for too long. It started hurting after 4-5 hours.    How long can you walk comfortably? 5-10 min    Diagnostic tests R ankle imaging; IMPRESSION:  Findings suggestive of small avulsion from the anterior aspect of  the distal tibia; Chest Xray IMPRESSION:  Possible mildly displaced lateral right tenth rib fracture. No  pneumothorax or pleural effusion.    Patient Stated Goals Pt states he would like to improve mobility, stairs, and floor to stand transfers.    Currently in Pain? No/denies    Pain Score 0-No pain                               [facility] Adult PT Treatment/Exercise - 05/03/21 0001       Ambulation/Gait   Gait Pattern Step-through pattern;Decreased  dorsiflexion - right;Decreased dorsiflexion - left;Decreased stride length;Antalgic    Stairs Yes    Stair Management Technique Alternating pattern;One rail Left      Posture/Postural Control   Posture Comments significant tibial varum      Exercises   Exercises Ankle      Knee/Hip Exercises: Aerobic   Recumbent Bike L2 5 mins      Manual Therapy   Soft tissue mobilization  Joint mobilization  STM R proximal fibularis group  R TCJ PA grade IV     Ankle Exercises: Stretches   Other Stretch ankle EV gentle stretch in standing or seated 30s 3x      Ankle Exercises: Machines for Strengthening   Cybex Leg Press 50 lbs 2x10   single leg R     Ankle Exercises: Standing   Heel Raises Limitations HR at stairs 2x10 slow eccentric   staggered stance, L foot on stair   Other Standing Ankle Exercises woodpecker at wall 10x 3s hold    Other Standing Ankle Exercises tandem stance on foam 30s 3x; side stepping YTB at toe box 25ft x2      Ankle Exercises: Seated   Other  Seated Ankle Exercises ankle seated EV with YTB 1s concentric 2s eccentric                      PT Short Term Goals - 04/26/21 3614       PT SHORT TERM GOAL #1   Title Pt will become independent with HEP in order to demonstrate synthesis of PT education.    Time 2    Period Weeks    Status New      PT SHORT TERM GOAL #2   Title Pt will have an at least 9 pt improvement in LEFS measure in order to demonstrate MCID improvement in daily function.    Time 4    Period Weeks    Status New               PT Long Term Goals - 04/26/21 0953       PT LONG TERM GOAL #1   Title Pt will have >18 pt improvement in LEFS measure in order to demonstrate MCID improvement in daily function.    Time 6    Period Weeks    Status New      PT LONG TERM GOAL #2   Title Pt will demonstrate/report ability to stand at workbench for extended periods of time without pain in order to demonstrate functional improvement  and tolerance to static positioning.    Time 6    Period Weeks    Status Achieved      PT LONG TERM GOAL #3   Title Pt will be able to demonstrate full floor to stand, kneeling to stand, and stand to kneeling transfer without UE support in order to demonstrate functional improvement in LE function for ADL/house hold duties.    Time 6    Period Weeks    Status New                   Plan - 05/03/21 4315     Clinical Impression Statement Pt has significant improvement of R ankle symptoms since last session. Pt did still have minor R fibularis group muscle tone/spasm that was relieved with STM and was able to follow up with R ankle stability exercise. Pt tolerated single leg exercise well and direction foot eversion loading without increased pain or difficulty. Pt to decreased frequency of visits in order to facilitate self progression and independence with exercise. Pt would benefit from continued skilled therapy to maximize functional mobility as well as prevent further decline in mobility.    Personal Factors and Comorbidities Age;Fitness;Education;Time since onset of injury/illness/exacerbation    Examination-Activity Limitations Lift;Stand;Locomotion Level;Bend;Transfers;Carry;Squat;Stairs    Examination-Participation Restrictions Occupation;Yard Work;Community Activity    Stability/Clinical Decision Making Stable/Uncomplicated    Rehab Potential Good    PT Frequency 1x / week    PT Duration 6 weeks   Recc 2x/week, but pt requires 1x/week due to schedule and distance to clinic   PT Treatment/Interventions ADLs/Self Care Home Management;Aquatic Therapy;Electrical Stimulation;Cryotherapy;Iontophoresis 4mg /ml Dexamethasone;Moist Heat;Ultrasound;Traction;Stair training;Therapeutic activities;Therapeutic exercise;Gait training;Balance training;Neuromuscular re-education;Patient/family education;Orthotic Fit/Training;Manual techniques;Passive range of motion;Dry needling;Taping;Spinal  Manipulations;Joint Manipulations    PT Next Visit Plan review HEP, SL ankle stability, SL leg press, soleus calf raise    PT Home Exercise Plan [id number]- knee; - ankle    Consulted and Agree with Plan of Care Patient;Family member/caregiver    Family Member Consulted Spouse             Patient will benefit from skilled therapeutic intervention  in order to improve the following deficits and impairments:  Abnormal gait, Pain, Improper body mechanics, Decreased coordination, Decreased mobility, Increased muscle spasms, Decreased activity tolerance, Decreased endurance, Decreased range of motion, Decreased strength, Hypomobility, Impaired flexibility, Difficulty walking  Visit Diagnosis: Pain in joint of right knee  Pain in joint of left knee  Pain in right ankle and joints of right foot  Muscle weakness (generalized)  Difficulty walking     Problem List Patient Active Problem List   Diagnosis Date Noted   Peroneal tendinitis of right lower extremity 04/25/2021   Benign localized prostatic hyperplasia with lower urinary tract symptoms (LUTS) 09/27/2020   Wheezing 09/14/2020   Chondrocalcinosis 03/14/2020   Trigger ring finger of right hand 01/22/2018   Dyslipidemia 09/23/2017   Bilateral primary osteoarthritis of knee 09/02/2017   Neck pain 02/15/2017   Primary osteoarthritis of right hip 02/15/2017   Tear of right acetabular labrum 02/15/2017   Right leg pain 01/04/2017   Obese 08/17/2015   Elevated blood sugar 08/17/2015   Shingles 09/10/2014   Dyspnea 11/27/2011   HERNIA, UMBILICAL 08/02/2008   Essential hypertension 01/30/2008   Allergic rhinitis 01/30/2008   GERD 01/30/2008   HEADACHE 01/30/2008   Zebedee Iba PT, DPT 05/03/21 9:04 AM   [facility] [address] [place], [place], [address] Phone: [phone]   Fax:  [phone]  Name: Frank [name] MRN: [id number] Date of Birth:  02/24/1951

## 2021-05-08 ENCOUNTER — Encounter: Payer: Medicare Other | Admitting: Physical Therapy

## 2021-05-11 ENCOUNTER — Other Ambulatory Visit: Payer: Self-pay

## 2021-05-11 ENCOUNTER — Encounter: Payer: Self-pay | Admitting: Family Medicine

## 2021-05-11 ENCOUNTER — Ambulatory Visit (INDEPENDENT_AMBULATORY_CARE_PROVIDER_SITE_OTHER): Payer: Medicare Other | Admitting: Family Medicine

## 2021-05-11 VITALS — BP 121/75 | HR 62 | Temp 98.2°F | Ht 62.0 in | Wt 192.0 lb

## 2021-05-11 DIAGNOSIS — I10 Essential (primary) hypertension: Secondary | ICD-10-CM

## 2021-05-11 DIAGNOSIS — M170 Bilateral primary osteoarthritis of knee: Secondary | ICD-10-CM

## 2021-05-11 MED ORDER — METHYLPREDNISOLONE ACETATE 80 MG/ML IJ SUSP
80.0000 mg | Freq: Once | INTRAMUSCULAR | Status: AC
Start: 2021-05-11 — End: 2021-05-11
  Administered 2021-05-11: 80 mg via INTRA_ARTICULAR

## 2021-05-11 NOTE — Patient Instructions (Signed)
It was very nice to see you today!  We injected both of your knees today.  This should also help with your ankle.  Please let us know if this continues to give you an issue.  Take care, Dr Jimmey Ralph  PLEASE NOTE:  If you had any lab tests please let us know if you have not heard back within a few days. You may see your results on mychart before we have a chance to review them but we will give you a call once they are reviewed by [place]. If we ordered any referrals today, please let us know if you have not heard from their office within the next week.   Please try these tips to maintain a healthy lifestyle:  Eat at least 3 REAL meals and 1-2 snacks per day.  Aim for no more than 5 hours between eating.  If you eat breakfast, please do so within one hour of getting up.   Each meal should contain half fruits/vegetables, one quarter protein, and one quarter carbs (no bigger than a computer mouse)  Cut down on sweet beverages. This includes juice, soda, and sweet tea.   Drink at least 1 glass of water with each meal and aim for at least 8 glasses per day  Exercise at least 150 minutes every week.

## 2021-05-11 NOTE — Progress Notes (Signed)
Frank [name] is a 70 y.o. male who presents today for an office visit.  Assessment/Plan:  New/Acute Problems: Left ankle pain Exam not consistent with fracture-do not think we need to get an x-ray today.  Hopefully should have some improvement with the steroid injection.  Performed today.  He will follow-up with PT and sports medicine over the next few weeks.  Chronic Problems Addressed Today: Essential hypertension At goal.  Continue lisinopril-HCTZ 20-25 once daily.  Bilateral primary osteoarthritis of knee Bilateral aspiration with injection performed today.  See below procedure note.  He tolerated well.     Subjective:  HPI:  He is accompanied by his wife. He reports having some swelling in his knees. He saw Alyssa Allwardt PA on 04/20/21 and was given Prednisone and he notes having mild relief. Also he saw sports medicine, Dr. Clementeen Graham, MD-diagnosed him with peroneal tendinitis of the right lower extremity. He was referred to physical therapy, but  prior to attending his visit he twisted his right ankle on uneven pavement at home. This episode increased his right ankle swelling and pain. In past he had a cortisone shot in his knee and it lasted for about 2 months. He is requesting for another cortisone shot. He denies any fever, chest pain, shortness of breath, abnormal gait, or left ankle pain.        Objective:  Physical Exam: BP 121/75   Pulse 62   Temp 98.2 F (36.8 C) (Temporal)   Ht 5\' 2"  (1.575 m)   Wt 192 lb (87.1 kg)   SpO2 96%   BMI 35.12 kg/m   Gen: No acute distress, resting comfortably Extremities: Knees with gross effusion bilaterally L>R. -Left ankle edematous.  No tenderness to palpation at posterior lateral malleolus.  Slight tenderness to palpation along peroneal tendon.  Neurovascular intact distally. Neuro: Grossly normal, moves all extremities Psych: Normal affect and thought content  Knee Aspiration and Injection Procedure Note  Indication:  Symptom relief of bilateral Knee Pain.  Procedure Details  Verbal consent was obtained for the procedure. The left joint was prepped with Betadine. Topical ethyl chloride was applied for anesthesia.  Approximately 3 cc of 1% lidocaine without epinephrine was infiltrated through the superior lateral aspect of his knee.  An 18-gauge needle was then inserted into the joint space and 15 cc of serous fluid was aspirated.   3 ml 1% lidocaine and 1 ml of 80mg /cc Depo-Medrol was then injected into the joint. The needle was removed and the area cleansed and dressed.  Right joint was then prepped with Betadine.  Topical ethyl chloride was applied.  3 cc of 1% lidocaine without epinephrine was infiltrated through the superior lateral aspect of his knee.  An 18-gauge needle was then inserted in the joint space and 5 cc of serous fluid was aspirated.  3 mL of 1% lidocaine and 1 mL of 80 mg/cc of Depo-Medrol was then injected into the joint.  It was withdrawn.  Sterile bandage was placed.  Complications:  None; patient tolerated the procedure well.    I,Savera Zaman,acting as a for , MD.,have documented all relevant documentation on the behalf of Neurosurgeon, MD,as directed by  Jacquiline Doe, MD while in the presence of Jacquiline Doe, MD.  I, Jacquiline Doe, MD, have reviewed all documentation for this visit. The documentation on 05/11/21 for the exam, diagnosis, procedures, and orders are all accurate and complete.       Jacquiline Doe. 05/13/21, MD 05/11/2021  9:41 AM

## 2021-05-11 NOTE — Assessment & Plan Note (Signed)
Bilateral aspiration with injection performed today.  See below procedure note.  He tolerated well.

## 2021-05-11 NOTE — Assessment & Plan Note (Signed)
At goal.  Continue lisinopril-HCTZ 20-25 once daily. 

## 2021-05-15 ENCOUNTER — Encounter (HOSPITAL_BASED_OUTPATIENT_CLINIC_OR_DEPARTMENT_OTHER): Payer: Medicare Other | Admitting: Physical Therapy

## 2021-05-22 ENCOUNTER — Ambulatory Visit (HOSPITAL_BASED_OUTPATIENT_CLINIC_OR_DEPARTMENT_OTHER): Payer: Medicare Other | Admitting: Physical Therapy

## 2021-05-22 ENCOUNTER — Ambulatory Visit: Payer: Medicare Other | Admitting: Family Medicine

## 2021-05-22 ENCOUNTER — Encounter (HOSPITAL_BASED_OUTPATIENT_CLINIC_OR_DEPARTMENT_OTHER): Payer: Self-pay | Admitting: Physical Therapy

## 2021-05-22 ENCOUNTER — Other Ambulatory Visit: Payer: Self-pay

## 2021-05-22 DIAGNOSIS — R262 Difficulty in walking, not elsewhere classified: Secondary | ICD-10-CM | POA: Diagnosis not present

## 2021-05-22 DIAGNOSIS — M25561 Pain in right knee: Secondary | ICD-10-CM

## 2021-05-22 DIAGNOSIS — M25571 Pain in right ankle and joints of right foot: Secondary | ICD-10-CM | POA: Diagnosis not present

## 2021-05-22 DIAGNOSIS — M6281 Muscle weakness (generalized): Secondary | ICD-10-CM | POA: Diagnosis not present

## 2021-05-22 DIAGNOSIS — M25562 Pain in left knee: Secondary | ICD-10-CM | POA: Diagnosis not present

## 2021-05-22 NOTE — Progress Notes (Deleted)
   I, Philbert Riser, LAT, ATC acting as a scribe for Clementeen Graham, MD.  Frank [name] is a 70 y.o. male who presents to [facility] today for f/u R ankle pain thought to be due to peroneal tendinopathy. Pt was last seen by Dr. Denyse Amass on 04/25/21 and was advised to use compression, HEP, Voltaren gel, and was referred to PT, of which he's completed 9 total visits. Today, pt reports  Dx imaging: 04/20/21 R ankle XR  Pertinent review of systems: ***  Relevant historical information: ***   Exam:  There were no vitals taken for this visit. General: Well Developed, well nourished, and in no acute distress.   MSK: ***    Lab and Radiology Results No results found for this or any previous visit (from the past 72 hour(s)). No results found.     Assessment and Plan: 70 y.o. male with ***   PDMP not reviewed this encounter. No orders of the defined types were placed in this encounter.  No orders of the defined types were placed in this encounter.    Discussed warning signs or symptoms. Please see discharge instructions. Patient expresses understanding.   ***

## 2021-05-22 NOTE — Therapy (Addendum)
[facility] [address] [place], [place], [address] Phone: [phone]   Fax:  [phone]  Physical Therapy Discharge  Patient Details  Name: Frank [name] MRN: [id number] Date of Birth: 03-Jul-1951 Referring Provider (PT): Dr. Georgina Snell   Encounter Date: 05/22/2021   PT End of Session - 05/22/21 1354     Visit Number 10    Number of Visits 20    Date for PT Re-Evaluation 07/25/21    Authorization Type UHC Medicare    PT Start Time 1350    PT Stop Time 9528    PT Time Calculation (min) 25 min    Activity Tolerance Patient tolerated treatment well    Behavior During Therapy [facility] for tasks assessed/performed             Past Medical History:  Diagnosis Date   Allergy    Asthma    GERD (gastroesophageal reflux disease)    Headache(784.0)    Hypertension     Past Surgical History:  Procedure Laterality Date   [place], 2009   kidney stones  05/20/95   KNEE SURGERY  02/1989   NASAL SINUS SURGERY  10/24/1992   ROTATOR CUFF REPAIR  04/25/93    There were no vitals filed for this visit.   Subjective Assessment - 05/22/21 1351     Subjective Pt states the ankle feels much better but pain will occur when he twists or turns on it. Pt states he was on a mower earlier today and did not have any pain while pushing up and down.    How long can you walk comfortably? 5-10 min    Diagnostic tests R ankle imaging; IMPRESSION:  Findings suggestive of small avulsion from the anterior aspect of  the distal tibia; Chest Xray IMPRESSION:  Possible mildly displaced lateral right tenth rib fracture. No  pneumothorax or pleural effusion.    Patient Stated Goals Pt states he would like to improve mobility, stairs, and floor to stand transfers.    Currently in Pain? No/denies    Pain Score 0-No pain                [facility] PT Assessment - 05/22/21 0001       Assessment   Medical Diagnosis R ankle pain    Referring  Provider (PT) Dr. Georgina Snell    Prior Therapy R shoulder      Precautions   Precautions None      Restrictions   Weight Bearing Restrictions No      Balance Screen   Has the patient fallen in the past 6 months Yes    How many times? 1    Has the patient had a decrease in activity level because of a fear of falling?  No    Is the patient reluctant to leave their home because of a fear of falling?  No      Home Ecologist residence    Living Arrangements Spouse/significant other      Prior Function   Level of Independence Independent      Cognition   Overall Cognitive Status Within Functional Limits for tasks assessed      Observation/Other Assessments   Other Surveys  Lower Extremity Functional Scale    Lower Extremity Functional Scale  64/80 80%      Functional Tests   Functional tests Squat;Step up;Sit to Stand;Floor to Stand      Squat   Comments  decreased depth but equal WB; limited by bilateral knee pain      Step Up   Comments WFL      Sit to Stand   Comments WNL from standard height chair      Posture/Postural Control   Posture Comments significant tibial varum      AROM   Overall AROM Comments R ankle WFL, no pain in any direction      PROM   Overall PROM Comments minor limitation in DF, no longer painful at end range      Strength   Overall Strength Comments R ankle 20single leg calf raises without pain, EV 4+/5, 5/5 all other directions      Special Tests    Special Tests Ankle/Foot Special Tests    Ankle/Foot Special Tests  Anterior Drawer Test;Talar Tilt Test      Anterior Drawer Test   Findings Negative      Talar Tilt Test    Findings Negative      Ambulation/Gait   Gait Pattern Step-through pattern;Decreased dorsiflexion - right;Decreased dorsiflexion - left;Decreased stride length;Antalgic    Stairs Yes    Stair Management Technique Alternating pattern;One rail Left      Balance   Balance Assessed Yes       Static Standing Balance   Static Standing Balance -  Activities  Tandam Stance - Left Leg;Tandam Stance - Right Leg;Single Leg Stance - Right Leg;Single Leg Stance - Left Leg                 [facility] Adult PT Treatment/Exercise - 05/22/21 0001       All Home Exercises Reviewed   Exercises Ankle                        Ankle Exercises: Stretches   Other Stretch ankle EV gentle stretch in standing or seated 30s 3x               Ankle Exercises: Standing   Heel Raises Limitations HR at stairs 2x10 slow eccentric   staggered stance, L foot on stair   Other Standing Ankle Exercises woodpecker at wall 10x 3s hold    Other Standing Ankle Exercises tandem stance on foam 30s 3x; side stepping YTB at toe box 68f x2      Ankle Exercises: Seated   Other Seated Ankle Exercises ankle seated EV with YTB 1s concentric 2s eccentric                    PT Education - 05/22/21 1352     Education Details ankle anatomy, exercise progression, ankle stability, muscle firing, final HEP, and D/C planning    Person(s) Educated Patient    Methods Explanation;Demonstration;Tactile cues;Verbal cues;Handout    Comprehension Verbalized understanding;Returned demonstration;Verbal cues required;Tactile cues required              PT Short Term Goals - 05/22/21 1425       PT SHORT TERM GOAL #1   Title Pt will become independent with HEP in order to demonstrate synthesis of PT education.    Time 2    Period Weeks    Status Achieved      PT SHORT TERM GOAL #2   Title Pt will have an at least 9 pt improvement in LEFS measure in order to demonstrate MCID improvement in daily function.    Time 4    Period Weeks    Status Achieved  PT Long Term Goals - 05/22/21 1425       PT LONG TERM GOAL #1   Title Pt will have >18 pt improvement in LEFS measure in order to demonstrate MCID improvement in daily function.    Time 6    Period Weeks    Status Achieved       PT LONG TERM GOAL #2   Title Pt will demonstrate/report ability to stand at workbench for extended periods of time without pain in order to demonstrate functional improvement and tolerance to static positioning.    Time 6    Period Weeks    Status Achieved      PT LONG TERM GOAL #3   Title Pt will be able to demonstrate full floor to stand, kneeling to stand, and stand to kneeling transfer without UE support in order to demonstrate functional improvement in LE function for ADL/house hold duties.    Time 6    Period Weeks    Status Achieved                   Plan - 05/22/21 1414     Clinical Impression Statement Pt has significant improvement of R ankle strength, ROM, and functional mobility. Pt had reached functional plateau with physical therapy and is satisfied with current level of function. Pt has improved almost all mobility goals and demonstrates improvement with all functional transfers with equal WB through bilat LE. Pt with no further skilled therapy needs at this time. Pt given printed and updated HEP for knees and ankle. D/C this episode of care.    Personal Factors and Comorbidities Age;Fitness;Education;Time since onset of injury/illness/exacerbation    Examination-Activity Limitations Lift;Stand;Locomotion Level;Bend;Transfers;Carry;Squat;Stairs    Examination-Participation Restrictions Occupation;Yard Work;Community Activity    Stability/Clinical Decision Making Stable/Uncomplicated    Rehab Potential Good    PT Frequency 1x / week    PT Duration 6 weeks   Recc 2x/week, but pt requires 1x/week due to schedule and distance to clinic   PT Treatment/Interventions ADLs/Self Care Home Management;Aquatic Therapy;Electrical Stimulation;Cryotherapy;Iontophoresis 47m/ml Dexamethasone;Moist Heat;Ultrasound;Traction;Stair training;Therapeutic activities;Therapeutic exercise;Gait training;Balance training;Neuromuscular re-education;Patient/family education;Orthotic  Fit/Training;Manual techniques;Passive range of motion;Dry needling;Taping;Spinal Manipulations;Joint Manipulations    PT Next Visit Plan --    PT Home Exercise Plan [id number]- knee; [id number] ankle    Consulted and Agree with Plan of Care Patient;Family member/caregiver    Family Member Consulted Spouse             Patient will benefit from skilled therapeutic intervention in order to improve the following deficits and impairments:  Abnormal gait, Pain, Improper body mechanics, Decreased coordination, Decreased mobility, Increased muscle spasms, Decreased activity tolerance, Decreased endurance, Decreased range of motion, Decreased strength, Hypomobility, Impaired flexibility, Difficulty walking  Visit Diagnosis: Pain in joint of right knee  Pain in joint of left knee  Pain in right ankle and joints of right foot  Muscle weakness (generalized)  Difficulty walking     Problem List Patient Active Problem List   Diagnosis Date Noted   Peroneal tendinitis of right lower extremity 04/25/2021   Benign localized prostatic hyperplasia with lower urinary tract symptoms (LUTS) 09/27/2020   Wheezing 09/14/2020   Chondrocalcinosis 03/14/2020   Trigger ring finger of right hand 01/22/2018   Dyslipidemia 09/23/2017   Bilateral primary osteoarthritis of knee 09/02/2017   Neck pain 02/15/2017   Primary osteoarthritis of right hip 02/15/2017   Tear of right acetabular labrum 02/15/2017   Right leg pain 01/04/2017   Obese 08/17/2015  Elevated blood sugar 08/17/2015   Shingles 09/10/2014   Dyspnea 28/76/8115   HERNIA, UMBILICAL 72/62/0355   Essential hypertension 01/30/2008   Allergic rhinitis 01/30/2008   GERD 01/30/2008   HEADACHE 01/30/2008   Daleen Bo PT, DPT 05/22/21 2:29 PM   [facility] [place], [place], [address] Phone: [phone]   Fax:  [phone]  Name: AZAREL BANNER MRN: [id number] Date of  Birth: 1951/10/26   PHYSICAL THERAPY DISCHARGE SUMMARY  Visits from Start of Care: 10   Plan: Patient agrees to discharge.  Patient goals were met. Patient is being discharged due to meeting the stated rehab goals.

## 2021-05-28 ENCOUNTER — Encounter: Payer: Self-pay | Admitting: *Deleted

## 2021-05-29 ENCOUNTER — Ambulatory Visit: Payer: Medicare Other | Admitting: Family Medicine

## 2021-05-29 NOTE — Progress Notes (Signed)
   I, Philbert Riser, LAT, ATC acting as a scribe for Clementeen Graham, MD.  Frank [name] is a 70 y.o. male who presents to [facility] at [facility] today for R ankle pain thought to be due to peroneal tendinopathy and a partial tear of the peroneus brevis. Pt was last seen by Dr. Denyse Amass on 04/25/21 and was advised to use Voltaren gel, compression, HEP, and was referred to PT, completing a total of 10 visits. Today, pt reports R ankle is all better. Pt has no other complaints. Pt is wondering when it is OK to stop wearing ankle compression sleeve.  Dx imaging: 04/20/21 R ankle XR  Pertinent review of systems: No fevers or chills  Relevant historical information: Hypertension   Exam:  BP (!) 146/86   Pulse (!) 49   Ht 5\' 2"  (1.575 m)   Wt 188 lb 9.6 oz (85.5 kg)   SpO2 100%   BMI 34.50 kg/m  General: Well Developed, well nourished, and in no acute distress.   MSK: Right ankle normal motion.  Normal gait.    Lab and Radiology Results  EXAM: RIGHT ANKLE - COMPLETE 3+ VIEW   COMPARISON:  None.   FINDINGS: Generalized soft tissue swelling is noted most prominent anteriorly. There is a lucency along the anterior aspect of the distal tibia seen only on the lateral projection which may represent a small avulsion fracture. Calcaneal spurring is seen. Tarsal degenerative changes are noted.   IMPRESSION: Findings suggestive of small avulsion from the anterior aspect of the distal tibia.     Electronically Signed   By: M.D.   On: 04/20/2021 23:11 I, 04/22/2021, personally (independently) visualized and performed the interpretation of the images attached in this note.     Assessment and Plan: 70 y.o. male with right ankle pain thought to be related to peroneal tendinitis with partial tendon tear previously seen on ultrasound.  He did well with physical therapy and compression.  He is now finished his formal physical therapy and is doing home  exercise program.  Plan to continue home PT and wean out of the compression ankle sleeve as needed.  Recheck back as needed.  Precautions and home exercise program and check back precautions reviewed.    Discussed warning signs or symptoms. Please see discharge instructions. Patient expresses understanding.   The above documentation has been reviewed and is accurate and complete 78, M.D. Total encounter time 20 minutes including face-to-face time with the patient and, reviewing past medical record, and charting on the date of service.   Treatment plan and options

## 2021-05-30 ENCOUNTER — Other Ambulatory Visit: Payer: Self-pay

## 2021-05-30 ENCOUNTER — Ambulatory Visit: Payer: Medicare Other | Admitting: Family Medicine

## 2021-05-30 VITALS — BP 146/86 | HR 49 | Ht 62.0 in | Wt 188.6 lb

## 2021-05-30 DIAGNOSIS — M7671 Peroneal tendinitis, right leg: Secondary | ICD-10-CM | POA: Diagnosis not present

## 2021-05-30 NOTE — Patient Instructions (Addendum)
Thank you for coming in today.   Glad you are doing so well!  See me again if you need anything.  Recheck as needed.

## 2021-06-11 ENCOUNTER — Emergency Department (HOSPITAL_BASED_OUTPATIENT_CLINIC_OR_DEPARTMENT_OTHER): Payer: Medicare Other

## 2021-06-11 ENCOUNTER — Other Ambulatory Visit: Payer: Self-pay

## 2021-06-11 ENCOUNTER — Ambulatory Visit
Admission: EM | Admit: 2021-06-11 | Discharge: 2021-06-11 | Discharge status: Absent without leave | Payer: Medicare Other

## 2021-06-11 ENCOUNTER — Encounter (HOSPITAL_BASED_OUTPATIENT_CLINIC_OR_DEPARTMENT_OTHER): Payer: Self-pay | Admitting: Emergency Medicine

## 2021-06-11 ENCOUNTER — Emergency Department (HOSPITAL_BASED_OUTPATIENT_CLINIC_OR_DEPARTMENT_OTHER)
Admission: EM | Admit: 2021-06-11 | Discharge: 2021-06-11 | Disposition: A | Discharge status: Home / Self Care | Payer: Medicare Other | Attending: Emergency Medicine | Admitting: Emergency Medicine

## 2021-06-11 DIAGNOSIS — I10 Essential (primary) hypertension: Secondary | ICD-10-CM | POA: Diagnosis not present

## 2021-06-11 DIAGNOSIS — J45909 Unspecified asthma, uncomplicated: Secondary | ICD-10-CM | POA: Insufficient documentation

## 2021-06-11 DIAGNOSIS — M1990 Unspecified osteoarthritis, unspecified site: Secondary | ICD-10-CM | POA: Insufficient documentation

## 2021-06-11 DIAGNOSIS — Z79899 Other long term (current) drug therapy: Secondary | ICD-10-CM | POA: Insufficient documentation

## 2021-06-11 DIAGNOSIS — Z87891 Personal history of nicotine dependence: Secondary | ICD-10-CM | POA: Insufficient documentation

## 2021-06-11 DIAGNOSIS — M25561 Pain in right knee: Secondary | ICD-10-CM | POA: Diagnosis present

## 2021-06-11 DIAGNOSIS — M1711 Unilateral primary osteoarthritis, right knee: Secondary | ICD-10-CM

## 2021-06-11 MED ORDER — PREDNISONE 20 MG PO TABS: ORAL_TABLET | ORAL | 0 refills | Status: DC

## 2021-06-11 NOTE — Discharge Instructions (Signed)
See ortho or primary doctor. Use ice, tylenol and ibuprofen for pain.

## 2021-06-11 NOTE — ED Triage Notes (Signed)
Woke up with R knee pain at 3am. States he is unable to bend it. No known injury.

## 2021-06-11 NOTE — ED Provider Notes (Signed)
[facility] EMERGENCY DEPT Provider Note   CSN: [id number] Arrival date & time: 06/11/21  1023     History Chief Complaint  Patient presents with   Knee Pain    Frank [name] is a 70 y.o. male.  Patient with history of asthma and allergies presents with right knee pain since 3:00 this morning.  Patient has history of arthritis.  No injuries known.  Patient unable to bend due to pain.  Patient's had fluid drained from the knee in the past and has orthopedic follow-up.  Patient can walk with his walking stick.  No history of blood clots, no recent surgeries, no new leg swelling, patient has chronic right ankle swelling from tendon tear in the past.  No chest pain or shortness of breath.      Past Medical History:  Diagnosis Date   Allergy    Asthma    GERD (gastroesophageal reflux disease)    Headache(784.0)    Hypertension     Patient Active Problem List   Diagnosis Date Noted   Peroneal tendinitis of right lower extremity 04/25/2021   Benign localized prostatic hyperplasia with lower urinary tract symptoms (LUTS) 09/27/2020   Wheezing 09/14/2020   Chondrocalcinosis 03/14/2020   Trigger ring finger of right hand 01/22/2018   Dyslipidemia 09/23/2017   Bilateral primary osteoarthritis of knee 09/02/2017   Neck pain 02/15/2017   Primary osteoarthritis of right hip 02/15/2017   Tear of right acetabular labrum 02/15/2017   Right leg pain 01/04/2017   Obese 08/17/2015   Elevated blood sugar 08/17/2015   Shingles 09/10/2014   Dyspnea 11/27/2011   HERNIA, UMBILICAL 08/02/2008   Essential hypertension 01/30/2008   Allergic rhinitis 01/30/2008   GERD 01/30/2008   HEADACHE 01/30/2008    Past Surgical History:  Procedure Laterality Date   HERNIA REPAIR  1969, 2009   kidney stones  05/20/95   KNEE SURGERY  02/1989   NASAL SINUS SURGERY  10/24/1992   ROTATOR CUFF REPAIR  04/25/93       Family History  Problem Relation Age of Onset   Kidney disease  Mother    COPD Father    Pneumonia Father    Cardiomyopathy Brother    Hypertension Sister    Heart disease Brother    Hypertension Brother    Hypertension Brother    Hypertension Sister    Diabetes Sister    Osteoarthritis Sister     Social History   Tobacco Use   Smoking status: Former   Smokeless tobacco: Never   Tobacco comments:    quit in 1972  Substance Use Topics   Alcohol use: No   Drug use: No    Home Medications Prior to Admission medications   Medication Sig Start Date End Date Taking? Authorizing Provider  predniSONE (DELTASONE) 20 MG tablet 2 tabs po daily x 4 days 06/11/21  Yes Blane Ohara, MD  acetaminophen (TYLENOL) 325 MG tablet Take 325 mg by mouth.    [provider]  albuterol (VENTOLIN HFA) 108 (90 Base) MCG/ACT inhaler Inhale 2 puffs into the lungs every 6 (six) hours as needed for wheezing or shortness of breath. 09/14/20   Ardith Dark, MD  ascorbic acid (VITAMIN C) 500 MG tablet Take 500 mg by mouth daily.    [provider]  diclofenac (VOLTAREN) 75 MG EC tablet TAKE 1 TABLET BY MOUTH TWICE A DAY 01/01/21   Ardith Dark, MD  diclofenac sodium (VOLTAREN) 1 % GEL Apply topically to affected  area qid 08/14/18   Andrena Mews, DO  lisinopril-hydrochlorothiazide (ZESTORETIC) 20-25 MG tablet TAKE 1 TABLET BY MOUTH EVERY DAY 06/22/19   Ardith Dark, MD  tamsulosin (FLOMAX) 0.4 MG CAPS capsule TAKE 1 CAPSULE BY MOUTH EVERY DAY 04/27/21   Ardith Dark, MD  Zinc 50 MG CAPS Take by mouth.    [provider]    Allergies    Atorvastatin and Bactrim [sulfamethoxazole-trimethoprim]  Review of Systems   Review of Systems  Constitutional:  Negative for chills and fever.  HENT:  Negative for congestion.   Eyes:  Negative for visual disturbance.  Respiratory:  Negative for shortness of breath.   Cardiovascular:  Negative for chest pain.  Gastrointestinal:  Negative for abdominal pain and vomiting.  Genitourinary:   Negative for dysuria and flank pain.  Musculoskeletal:  Positive for gait problem and joint swelling. Negative for back pain, neck pain and neck stiffness.  Skin:  Negative for rash.  Neurological:  Negative for light-headedness and headaches.   Physical Exam Updated Vital Signs BP 129/83   Pulse 72   Temp 99 F (37.2 C) (Oral)   Resp 16   Ht 5\' 2"  (1.575 m)   Wt 86.2 kg   SpO2 100%   BMI 34.75 kg/m   Physical Exam Vitals and nursing note reviewed.  Constitutional:      General: He is not in acute distress.    Appearance: He is well-developed.  HENT:     Head: Normocephalic and atraumatic.     Mouth/Throat:     Mouth: Mucous membranes are moist.  Eyes:     General:        Right eye: No discharge.        Left eye: No discharge.     Conjunctiva/sclera: Conjunctivae normal.  Neck:     Trachea: No tracheal deviation.  Cardiovascular:     Rate and Rhythm: Normal rate.  Pulmonary:     Effort: Pulmonary effort is normal.     Breath sounds: Normal breath sounds.  Abdominal:     General: There is no distension.     Palpations: Abdomen is soft.     Tenderness: There is no abdominal tenderness. There is no guarding.  Musculoskeletal:        General: Swelling and tenderness present.     Cervical back: Normal range of motion and neck supple. No rigidity.     Comments: Patient has mild swelling lateral and medial aspect of right knee and difficulty with full extension and full flexion.  No external signs of infection.  Compartments soft.  Mild swelling lower leg and ankle nontender.  No obvious ligament laxity right knee.  Skin:    General: Skin is warm.     Capillary Refill: Capillary refill takes less than 2 seconds.     Findings: No rash.  Neurological:     General: No focal deficit present.     Mental Status: He is alert.     Cranial Nerves: No cranial nerve deficit.  Psychiatric:        Mood and Affect: Mood normal.    ED Results / Procedures / Treatments    Labs (all labs ordered are listed, but only abnormal results are displayed) Labs Reviewed - No data to display  EKG None  Radiology DG Knee Complete 4 Views Right  Result Date: 06/11/2021 CLINICAL DATA:  Knee pain.  No known injury. EXAM: RIGHT KNEE - COMPLETE 4+ VIEW COMPARISON:  None. FINDINGS:  Degenerative narrowing of all 3 compartments of the RIGHT knee, mild to moderate in degree, with associated osseous spurring. Faint calcifications are seen throughout the joint, indicating underlying chondrocalcinosis suggesting CPPD. No fracture line or displaced fracture fragment. No acute-appearing cortical irregularity or osseous lesion. No appreciable joint effusion. Adjacent soft tissues are unremarkable. IMPRESSION: 1. No acute findings. 2. Tricompartmental degenerative narrowing of the RIGHT knee, mild to moderate in degree, with associated mild osseous spurring. Chondrocalcinosis suggests CPPD. Electronically Signed   By: Bary Richard M.D.   On: 06/11/2021 12:00    Procedures Procedures   Medications Ordered in ED Medications - No data to display  ED Course  I have reviewed the triage vital signs and the nursing notes.  Pertinent labs & imaging results that were available during my care of the patient were reviewed by me and considered in my medical decision making (see chart for details).    MDM Rules/Calculators/A&P                           Patient presents with right knee pain and swelling clinical concern for arthritis/meniscal/tendinitis type presentation.  No concern for blood clot at this time with pain with movement and no classic blood clot risk factors.  No signs of significant infection on exam.  Discussed abortive care and follow-up with his orthopedic doctor.  Prednisone for home.  Patient is nondiabetic. Final Clinical Impression(s) / ED Diagnoses Final diagnoses:  Arthritis of right knee    Rx / DC Orders ED Discharge Orders          Ordered    predniSONE  (DELTASONE) 20 MG tablet        06/11/21 1422             Blane Ohara, MD 06/11/21 1434

## 2021-06-23 ENCOUNTER — Other Ambulatory Visit: Payer: Self-pay

## 2021-06-23 ENCOUNTER — Ambulatory Visit (INDEPENDENT_AMBULATORY_CARE_PROVIDER_SITE_OTHER): Payer: Medicare Other | Admitting: Physician Assistant

## 2021-06-23 ENCOUNTER — Encounter: Payer: Self-pay | Admitting: Physician Assistant

## 2021-06-23 VITALS — BP 116/70 | HR 72 | Temp 98.7°F | Ht 62.0 in | Wt 184.6 lb

## 2021-06-23 DIAGNOSIS — R0609 Other forms of dyspnea: Secondary | ICD-10-CM | POA: Diagnosis not present

## 2021-06-23 DIAGNOSIS — K120 Recurrent oral aphthae: Secondary | ICD-10-CM

## 2021-06-23 DIAGNOSIS — Z833 Family history of diabetes mellitus: Secondary | ICD-10-CM

## 2021-06-23 LAB — COMPREHENSIVE METABOLIC PANEL
ALT: 13 U/L (ref 0–53)
AST: 16 U/L (ref 0–37)
Albumin: 4 g/dL (ref 3.5–5.2)
Alkaline Phosphatase: 45 U/L (ref 39–117)
BUN: 23 mg/dL (ref 6–23)
CO2: 28 mEq/L (ref 19–32)
Calcium: 9.5 mg/dL (ref 8.4–10.5)
Chloride: 103 mEq/L (ref 96–112)
Creatinine, Ser: 1.02 mg/dL (ref 0.40–1.50)
GFR: 74.83 mL/min (ref >60.00)
Glucose, Bld: 88 mg/dL (ref 70–99)
Potassium: 3.9 mEq/L (ref 3.5–5.1)
Sodium: 140 mEq/L (ref 135–145)
Total Bilirubin: 0.4 mg/dL (ref 0.2–1.2)
Total Protein: 6.8 g/dL (ref 6.0–8.3)

## 2021-06-23 LAB — CBC WITH DIFFERENTIAL/PLATELET
Basophils Absolute: 0.1 10*3/uL (ref 0.0–0.1)
Basophils Relative: 0.8 % (ref 0.0–3.0)
Eosinophils Absolute: 0.1 10*3/uL (ref 0.0–0.7)
Eosinophils Relative: 1.5 % (ref 0.0–5.0)
HCT: 41 % (ref 39.0–52.0)
Hemoglobin: 13.7 g/dL (ref 13.0–17.0)
Lymphocytes Relative: 14.9 % (ref 12.0–46.0)
Lymphs Abs: 1.4 10*3/uL (ref 0.7–4.0)
MCHC: 33.3 g/dL (ref 30.0–36.0)
MCV: 94.4 fl (ref 78.0–100.0)
Monocytes Absolute: 0.6 10*3/uL (ref 0.1–1.0)
Monocytes Relative: 6.7 % (ref 3.0–12.0)
Neutro Abs: 7.2 10*3/uL (ref 1.4–7.7)
Neutrophils Relative %: 76.1 % (ref 43.0–77.0)
Platelets: 240 10*3/uL (ref 150.0–400.0)
RBC: 4.35 Mil/uL (ref 4.22–5.81)
RDW: 13.3 % (ref 11.5–15.5)
WBC: 9.4 10*3/uL (ref 4.0–10.5)

## 2021-06-23 LAB — VITAMIN B12: Vitamin B-12: 243 pg/mL (ref 211–911)

## 2021-06-23 LAB — HEMOGLOBIN A1C: Hgb A1c MFr Bld: 6.2 % (ref 4.6–6.5)

## 2021-06-23 MED ORDER — TRIAMCINOLONE ACETONIDE 0.1 % MT PSTE: PASTE | OROMUCOSAL | 12 refills | Status: AC

## 2021-06-23 NOTE — Patient Instructions (Signed)
It was great to see you!  Trial dental paste for you mouth ulcers  Update blood work today  If no improvement or any worsening, we need to know  I've put in updated cardiology referral for you  Take care,  Jarold Motto PA-C

## 2021-06-23 NOTE — Progress Notes (Signed)
Frank [name] is a 70 y.o. male here for a new problem.  History of Present Illness:   Chief Complaint  Patient presents with   Mouth Lesions    HPI  Mouth lesions White sores in mouth. Has been there for a month. They come and go. Thought it might be related to tomatoes but this didn't make a difference when he decreased intake. Has had these in the past but not this frequently. Painful. No recent antibiotic usage. Reports being UTD with dentist. Orajel mouth ointment use without only temporary relief of pain.  Denies: fever, chills, recent URI, hx of crohns, UC, chronic diarrhea, unintentional weight loss   Family hx of diabetes States that he has strong fam hx of diabetes. Would like to be checked for this today.  Wt Readings from Last 3 Encounters:  06/23/21 184 lb 9.6 oz (83.7 kg)  06/11/21 190 lb (86.2 kg)  05/30/21 188 lb 9.6 oz (85.5 kg)    Dyspnea Reports that he has had noticed a slight increase in SOB when walking in his yard over the past few months. Denies chest pain, n/v, arm pain, blurred vision, LE swelling. 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   Past Medical History:  Diagnosis Date   Allergy    Asthma    GERD (gastroesophageal reflux disease)    Headache(784.0)    Hypertension      Social History   Tobacco Use   Smoking status: Former   Smokeless tobacco: Never   Tobacco comments:    quit in 1972  Substance Use Topics   Alcohol use: No   Drug use: No    Past Surgical History:  Procedure Laterality Date   HERNIA REPAIR  1969, 2009   kidney stones  05/20/95   KNEE SURGERY  02/1989   NASAL SINUS SURGERY  10/24/1992   ROTATOR CUFF REPAIR  04/25/93    Family History  Problem Relation Age of Onset   Kidney disease Mother    COPD Father    Pneumonia Father    Cardiomyopathy Brother    Hypertension Sister    Heart disease Brother    Hypertension Brother    Hypertension Brother    Hypertension Sister    Diabetes Sister     Osteoarthritis Sister     Allergies  Allergen Reactions   Atorvastatin Other (See Comments)    Joint pain   Bactrim [Sulfamethoxazole-Trimethoprim] Hives    Current Medications:   Current Outpatient Medications:    acetaminophen (TYLENOL) 325 MG tablet, Take 325 mg by mouth., Disp: , Rfl:    albuterol (VENTOLIN HFA) 108 (90 Base) MCG/ACT inhaler, Inhale 2 puffs into the lungs every 6 (six) hours as needed for wheezing or shortness of breath., Disp: 8 g, Rfl: 0   ascorbic acid (VITAMIN C) 500 MG tablet, Take 500 mg by mouth daily., Disp: , Rfl:    diclofenac (VOLTAREN) 75 MG EC tablet, TAKE 1 TABLET BY MOUTH TWICE A DAY, Disp: 60 tablet, Rfl: 5   diclofenac sodium (VOLTAREN) 1 % GEL, Apply topically to affected area qid, Disp: 100 g, Rfl: 1   lisinopril-hydrochlorothiazide (ZESTORETIC) 20-25 MG tablet, TAKE 1 TABLET BY MOUTH EVERY DAY, Disp: 90 tablet, Rfl: 1   predniSONE (DELTASONE) 20 MG tablet, 2 tabs po daily x 4 days, Disp: 8 tablet, Rfl: 0   tamsulosin (FLOMAX) 0.4 MG CAPS capsule, TAKE 1 CAPSULE BY MOUTH EVERY DAY, Disp: 90 capsule, Rfl: 1  triamcinolone (KENALOG) 0.1 % paste, Apply to affected area 1-2 times daily, Disp: 5 g, Rfl: 12   Zinc 50 MG CAPS, Take by mouth., Disp: , Rfl:    Review of Systems:   ROS Negative unless otherwise specified per HPI.   Vitals:   Vitals:   06/23/21 1307  BP: 116/70  Pulse: 72  Temp: 98.7 F (37.1 C)  TempSrc: Temporal  SpO2: 97%  Weight: 184 lb 9.6 oz (83.7 kg)  Height: 5\' 2"  (1.575 m)     Body mass index is 33.76 kg/m.  Physical Exam:   Physical Exam Vitals and nursing note reviewed.  Constitutional:      General: He is not in acute distress.    Appearance: He is well-developed. He is not ill-appearing or toxic-appearing.  HENT:     Mouth/Throat:     Comments: White circular shallow ulcers x 4 on buccal mucosa Cardiovascular:     Rate and Rhythm: Normal rate and regular rhythm.     Pulses: Normal pulses.      Heart sounds: Normal heart sounds, S1 normal and S2 normal.     Comments: No LE edema Pulmonary:     Effort: Pulmonary effort is normal.     Breath sounds: Normal breath sounds.  Skin:    General: Skin is warm and dry.  Neurological:     Mental Status: He is alert.     GCS: GCS eye subscore is 4. GCS verbal subscore is 5. GCS motor subscore is 6.  Psychiatric:        Speech: Speech normal.        Behavior: Behavior normal. Behavior is cooperative.    Assessment and Plan:   Frank [name] was seen today for mouth lesions.  Diagnoses and all orders for this visit:  Aphthous ulcer Will trial topical triamcinolone paste Update blood work today, also check for B12 deficiency If no improvement or any worsening, will recommend follow-up for further evaluation of recurrent ulcers -     Vitamin B12 -     CBC with Differential/Platelet -     Comprehensive metabolic panel -     Hemoglobin A1c  Other form of dyspnea No red flags on discussion, will refer to cardiology for repeat work-up Stable today -     Ambulatory referral to Cardiology  Family history of diabetes mellitus Update A1c per patient request.  Other orders -     triamcinolone (KENALOG) 0.1 % paste; Apply to affected area 1-2 times daily  Leonette Most, PA-C

## 2021-07-07 ENCOUNTER — Other Ambulatory Visit: Payer: Self-pay | Admitting: Family Medicine

## 2021-08-03 ENCOUNTER — Other Ambulatory Visit: Payer: Self-pay

## 2021-08-03 ENCOUNTER — Ambulatory Visit: Payer: Medicare Other | Admitting: Internal Medicine

## 2021-08-03 ENCOUNTER — Encounter: Payer: Self-pay | Admitting: Internal Medicine

## 2021-08-03 DIAGNOSIS — I10 Essential (primary) hypertension: Secondary | ICD-10-CM | POA: Diagnosis not present

## 2021-08-03 DIAGNOSIS — R0600 Dyspnea, unspecified: Secondary | ICD-10-CM

## 2021-08-03 DIAGNOSIS — Z8719 Personal history of other diseases of the digestive system: Secondary | ICD-10-CM

## 2021-08-03 MED ORDER — METOPROLOL TARTRATE 100 MG PO TABS: 100.0000 mg | ORAL_TABLET | Freq: Once | ORAL | 0 refills | Status: DC

## 2021-08-03 MED ORDER — FUROSEMIDE 20 MG PO TABS: 20.0000 mg | ORAL_TABLET | Freq: Every day | ORAL | 3 refills | Status: DC

## 2021-08-03 NOTE — Progress Notes (Signed)
Cardiology Office Note:    Date:  08/03/2021   ID:  Frank [name], DOB Jun 27, 1951, MRN [id number]  PCP:  Ardith Dark, MD   [facility] Providers Cardiologist:  Orbie Pyo, MD     Referring MD: Jarold Motto, PA   CC: Dyspnea   History of Present Illness:    PROBLEM LIST: 1.  Hypertension 2.  Family history of early coronary artery disease 3.  Prior remote smoking (~0.5 ppd x 2 years)  4.  Asthma  Frank [name] is a 70 y.o. male with a history as detailed above referred by Dr. Bufford Buttner for recommendations regarding dyspnea.  The patient was recently seen and had noticed increasing exertional dyspnea.  The patient had been seen by Dr. Freida Busman several years ago for the same issue.  An echocardiogram demonstrated normal left ventricular function with mild mitral regurgitation.  A stress test demonstrated low risk findings.  Today he tells me that he does develop wheezing when he exerts himself more than moderately.  This is not associated with chest pain.  He has had no presyncope or syncope.  He denies any paroxysmal nocturnal dyspnea.  He does have unilateral lower extremity edema affecting his right side but this is after he sustained a muscle strain of his ankle recently.  He has required no emergency room visits or hospitalizations recently.  He denies any signs or symptoms of stroke, severe bleeding or bruising, or recent coronavirus infection.  He is not vaccinated.   Past Surgical History:  Procedure Laterality Date   HERNIA REPAIR  1969, 2009   kidney stones  05/20/95   KNEE SURGERY  02/1989   NASAL SINUS SURGERY  10/24/1992   ROTATOR CUFF REPAIR  04/25/93    Current Medications: Current Meds  Medication Sig   acetaminophen (TYLENOL) 325 MG tablet Take 325 mg by mouth.   albuterol (VENTOLIN HFA) 108 (90 Base) MCG/ACT inhaler Inhale 2 puffs into the lungs every 6 (six) hours as needed for wheezing or shortness of breath.   ascorbic acid (VITAMIN C) 500 MG  tablet Take 500 mg by mouth daily.   diclofenac (VOLTAREN) 75 MG EC tablet TAKE 1 TABLET BY MOUTH TWICE A DAY   diclofenac sodium (VOLTAREN) 1 % GEL Apply topically to affected area qid   furosemide (LASIX) 20 MG tablet Take 1 tablet (20 mg total) by mouth daily.   lisinopril-hydrochlorothiazide (ZESTORETIC) 20-25 MG tablet TAKE 1 TABLET BY MOUTH EVERY DAY   metoprolol tartrate (LOPRESSOR) 100 MG tablet Take 1 tablet (100 mg total) by mouth once for 1 dose. Take 90-120 minutes prior to scan.   tamsulosin (FLOMAX) 0.4 MG CAPS capsule TAKE 1 CAPSULE BY MOUTH EVERY DAY   triamcinolone (KENALOG) 0.1 % paste Apply to affected area 1-2 times daily   Zinc 50 MG CAPS Take by mouth.     Allergies:   Atorvastatin and Bactrim [sulfamethoxazole-trimethoprim]   Social History   Socioeconomic History   Marital status: Married    Spouse name: Not on file   Number of children: Not on file   Years of education: Not on file   Highest education level: Not on file  Occupational History   Not on file  Tobacco Use   Smoking status: Former   Smokeless tobacco: Never   Tobacco comments:    quit in 1972  Vaping Use   Vaping Use: Not on file  Substance and Sexual Activity   Alcohol use: No   Drug use:  No   Sexual activity: Not on file  Other Topics Concern   Not on file  Social History Narrative   Not on file   Social Determinants of Health   Financial Resource Strain: Not on file  Food Insecurity: Not on file  Transportation Needs: Not on file  Physical Activity: Not on file  Stress: Not on file  Social Connections: Not on file     Family History: Family history of early myocardial infarction in his brothers  ROS:   Please see the history of present illness.    All other systems reviewed and are negative.  EKGs/Labs/Other Studies Reviewed:    The following studies were reviewed today: Echocardiogram from 2013 demonstrated normal ejection fraction with mild mitral  regurgitation Stress Myoview from 2013 demonstrated low risk findings  EKG:  The ekg ordered today demonstrates normal sinus rhythm with no Q waves or ST changes.  Recent Labs: 06/23/2021: ALT 13; BUN 23; Creatinine, Ser 1.02; Hemoglobin 13.7; Platelets 240.0; Potassium 3.9; Sodium 140  Recent Lipid Panel    Component Value Date/Time   CHOL 185 05/13/2017 1017   TRIG 166.0 (H) 05/13/2017 1017   HDL 36.00 (L) 05/13/2017 1017   CHOLHDL 5 05/13/2017 1017   VLDL 33.2 05/13/2017 1017   LDLCALC 116 (H) 05/13/2017 1017   LDLDIRECT 118.0 08/10/2015 0815     Risk Assessment/Calculations:           Physical Exam:    VS:  BP 122/74 (BP Location: Left Arm, Patient Position: Sitting, Cuff Size: Normal)   Pulse 81   Ht 5\' 2"  (1.575 m)   Wt 185 lb 12.8 oz (84.3 kg)   SpO2 97%   BMI 33.98 kg/m     Wt Readings from Last 3 Encounters:  08/03/21 185 lb 12.8 oz (84.3 kg)  06/23/21 184 lb 9.6 oz (83.7 kg)  06/11/21 190 lb (86.2 kg)     GEN: Well nourished, well developed in no acute distress HEENT: Normal NECK: No JVD; No carotid bruits LYMPHATICS: No lymphadenopathy CARDIAC: RRR, no murmurs, rubs, gallops RESPIRATORY:  Clear to auscultation without rales, wheezing or rhonchi  ABDOMEN: Soft, non-tender, non-distended MUSCULOSKELETAL:  No edema; No deformity  SKIN: Warm and dry NEUROLOGIC:  Alert and oriented x 3 PSYCHIATRIC:  Normal affect    ASSESSMENT and PLAN:    1. Dyspnea, unspecified type   Given the patient's family history we will obtain a CT scan with FFR assessment.  I will obtain an echocardiogram as well.  He also may have developed diastolic dysfunction and so I will start Lasix 20 mg p.o. daily and check a renal panel in 1 week's time.  If all of this testing is negative and the empiric Lasix is not helpful then we will obtain PFTs but I suspect these will be relatively normal given his limited smoking history.  However he has quite a secondhand exposure from his  father.  I will see the patient back in 6 months or earlier if needed as informed by our therapeutic plan and evaluation.    2. Essential hypertension   Blood pressures well controlled on his current regimen.   3. History of gastroesophageal reflux (GERD)   Continue current therapy.        Signed, 06/13/21, MD  08/03/2021 2:44 PM    [facility]

## 2021-08-03 NOTE — H&P (View-Only) (Signed)
Cardiology Office Note:    Date:  08/03/2021   ID:  Frank [name], DOB 21-Sep-1951, MRN [id number]  PCP:  Ardith Dark, MD   [facility] Providers Cardiologist:  Orbie Pyo, MD     Referring MD: Jarold Motto, PA   CC: Dyspnea   History of Present Illness:    PROBLEM LIST: 1.  Hypertension 2.  Family history of early coronary artery disease 3.  Prior remote smoking (~0.5 ppd x 2 years)  4.  Asthma  Frank [name] is a 70 y.o. male with a history as detailed above referred by Dr. Bufford Buttner for recommendations regarding dyspnea.  The patient was recently seen and had noticed increasing exertional dyspnea.  The patient had been seen by Dr. Freida Busman several years ago for the same issue.  An echocardiogram demonstrated normal left ventricular function with mild mitral regurgitation.  A stress test demonstrated low risk findings.  Today he tells me that he does develop wheezing when he exerts himself more than moderately.  This is not associated with chest pain.  He has had no presyncope or syncope.  He denies any paroxysmal nocturnal dyspnea.  He does have unilateral lower extremity edema affecting his right side but this is after he sustained a muscle strain of his ankle recently.  He has required no emergency room visits or hospitalizations recently.  He denies any signs or symptoms of stroke, severe bleeding or bruising, or recent coronavirus infection.  He is not vaccinated.   Past Surgical History:  Procedure Laterality Date   HERNIA REPAIR  1969, 2009   kidney stones  05/20/95   KNEE SURGERY  02/1989   NASAL SINUS SURGERY  10/24/1992   ROTATOR CUFF REPAIR  04/25/93    Current Medications: Current Meds  Medication Sig   acetaminophen (TYLENOL) 325 MG tablet Take 325 mg by mouth.   albuterol (VENTOLIN HFA) 108 (90 Base) MCG/ACT inhaler Inhale 2 puffs into the lungs every 6 (six) hours as needed for wheezing or shortness of breath.   ascorbic acid (VITAMIN C) 500 MG  tablet Take 500 mg by mouth daily.   diclofenac (VOLTAREN) 75 MG EC tablet TAKE 1 TABLET BY MOUTH TWICE A DAY   diclofenac sodium (VOLTAREN) 1 % GEL Apply topically to affected area qid   furosemide (LASIX) 20 MG tablet Take 1 tablet (20 mg total) by mouth daily.   lisinopril-hydrochlorothiazide (ZESTORETIC) 20-25 MG tablet TAKE 1 TABLET BY MOUTH EVERY DAY   metoprolol tartrate (LOPRESSOR) 100 MG tablet Take 1 tablet (100 mg total) by mouth once for 1 dose. Take 90-120 minutes prior to scan.   tamsulosin (FLOMAX) 0.4 MG CAPS capsule TAKE 1 CAPSULE BY MOUTH EVERY DAY   triamcinolone (KENALOG) 0.1 % paste Apply to affected area 1-2 times daily   Zinc 50 MG CAPS Take by mouth.     Allergies:   Atorvastatin and Bactrim [sulfamethoxazole-trimethoprim]   Social History   Socioeconomic History   Marital status: Married    Spouse name: Not on file   Number of children: Not on file   Years of education: Not on file   Highest education level: Not on file  Occupational History   Not on file  Tobacco Use   Smoking status: Former   Smokeless tobacco: Never   Tobacco comments:    quit in 1972  Vaping Use   Vaping Use: Not on file  Substance and Sexual Activity   Alcohol use: No   Drug use:  No   Sexual activity: Not on file  Other Topics Concern   Not on file  Social History Narrative   Not on file   Social Determinants of Health   Financial Resource Strain: Not on file  Food Insecurity: Not on file  Transportation Needs: Not on file  Physical Activity: Not on file  Stress: Not on file  Social Connections: Not on file     Family History: Family history of early myocardial infarction in his brothers  ROS:   Please see the history of present illness.    All other systems reviewed and are negative.  EKGs/Labs/Other Studies Reviewed:    The following studies were reviewed today: Echocardiogram from 2013 demonstrated normal ejection fraction with mild mitral  regurgitation Stress Myoview from 2013 demonstrated low risk findings  EKG:  The ekg ordered today demonstrates normal sinus rhythm with no Q waves or ST changes.  Recent Labs: 06/23/2021: ALT 13; BUN 23; Creatinine, Ser 1.02; Hemoglobin 13.7; Platelets 240.0; Potassium 3.9; Sodium 140  Recent Lipid Panel    Component Value Date/Time   CHOL 185 05/13/2017 1017   TRIG 166.0 (H) 05/13/2017 1017   HDL 36.00 (L) 05/13/2017 1017   CHOLHDL 5 05/13/2017 1017   VLDL 33.2 05/13/2017 1017   LDLCALC 116 (H) 05/13/2017 1017   LDLDIRECT 118.0 08/10/2015 0815     Risk Assessment/Calculations:           Physical Exam:    VS:  BP 122/74 (BP Location: Left Arm, Patient Position: Sitting, Cuff Size: Normal)   Pulse 81   Ht 5\' 2"  (1.575 m)   Wt 185 lb 12.8 oz (84.3 kg)   SpO2 97%   BMI 33.98 kg/m     Wt Readings from Last 3 Encounters:  08/03/21 185 lb 12.8 oz (84.3 kg)  06/23/21 184 lb 9.6 oz (83.7 kg)  06/11/21 190 lb (86.2 kg)     GEN: Well nourished, well developed in no acute distress HEENT: Normal NECK: No JVD; No carotid bruits LYMPHATICS: No lymphadenopathy CARDIAC: RRR, no murmurs, rubs, gallops RESPIRATORY:  Clear to auscultation without rales, wheezing or rhonchi  ABDOMEN: Soft, non-tender, non-distended MUSCULOSKELETAL:  No edema; No deformity  SKIN: Warm and dry NEUROLOGIC:  Alert and oriented x 3 PSYCHIATRIC:  Normal affect    ASSESSMENT and PLAN:    1. Dyspnea, unspecified type   Given the patient's family history we will obtain a CT scan with FFR assessment.  I will obtain an echocardiogram as well.  He also may have developed diastolic dysfunction and so I will start Lasix 20 mg p.o. daily and check a renal panel in 1 week's time.  If all of this testing is negative and the empiric Lasix is not helpful then we will obtain PFTs but I suspect these will be relatively normal given his limited smoking history.  However he has quite a secondhand exposure from his  father.  I will see the patient back in 6 months or earlier if needed as informed by our therapeutic plan and evaluation.    2. Essential hypertension   Blood pressures well controlled on his current regimen.   3. History of gastroesophageal reflux (GERD)   Continue current therapy.        Signed, 06/13/21, MD  08/03/2021 2:44 PM    [facility]

## 2021-08-03 NOTE — Patient Instructions (Signed)
Medication Instructions:  Your physician has recommended you make the following change in your medication:  1.) start furosemide (Lasix) 20 mg - one tablet daily  *If you need a refill on your cardiac medications before your next appointment, please call your pharmacy*   Lab Work: In one week please return for blood work (renal panel)  If you have labs (blood work) drawn today and your tests are completely normal, you will receive your results only by: MyChart Message (if you have MyChart) OR A paper copy in the mail If you have any lab test that is abnormal or we need to change your treatment, we will call you to review the results.   Testing/Procedures: Your physician has requested that you have an echocardiogram. Echocardiography is a painless test that uses sound waves to create images of your heart. It provides your doctor with information about the size and shape of your heart and how well your heart's chambers and valves are working. This procedure takes approximately one hour. There are no restrictions for this procedure.  Cardiac CTA - see instructions below  Follow-Up: At [facility], you and your health needs are our priority.  As part of our continuing mission to provide you with exceptional heart care, we have created designated Provider Care Teams.  These Care Teams include your primary Cardiologist (physician) and Advanced Practice Providers (APPs -  Physician Assistants and Nurse Practitioners) who all work together to provide you with the care you need, when you need it.   Your next appointment:   6 month(s)  The format for your next appointment:   In Person  Provider:   Dr. Lynnette Caffey   Other Instructions   Your cardiac CT will be scheduled at one of the below locations:   [facility] [address] [place], [place] [address] [phone]  Please arrive at the [facility] main entrance (entrance A) of [facility] 30 minutes prior  to test start time. Proceed to the [facility] Radiology Department (first floor) to check-in and test prep.   Please follow these instructions carefully (unless otherwise directed):  Hold all erectile dysfunction medications at least 3 days (72 hrs) prior to test.  On the Night Before the Test: Be sure to Drink plenty of water. Do not consume any caffeinated/decaffeinated beverages or chocolate 12 hours prior to your test. Do not take any antihistamines 12 hours prior to your test.  On the Day of the Test: Drink plenty of water until 1 hour prior to the test. Do not eat any food 4 hours prior to the test. You may take your regular medications prior to the test.  Take metoprolol (Lopressor) two hours prior to test. HOLD lisinopril-hydrochlorothiazide morning of the test.      After the Test: Drink plenty of water. After receiving IV contrast, you may experience a mild flushed feeling. This is normal. On occasion, you may experience a mild rash up to 24 hours after the test. This is not dangerous. If this occurs, you can take Benadryl 25 mg and increase your fluid intake. If you experience trouble breathing, this can be serious. If it is severe call 911 IMMEDIATELY. If it is mild, please call our office. If you take any of these medications: Glipizide/Metformin, Avandament, Glucavance, please do not take 48 hours after completing test unless otherwise instructed.  Please allow 2-4 weeks for scheduling of routine cardiac CTs. Some insurance companies require a pre-authorization which may delay scheduling of this  test.   For non-scheduling related questions, please contact the cardiac imaging nurse navigator should you have any questions/concerns: Rockwell Alexandria, Cardiac Imaging Nurse Navigator Larey Brick, Cardiac Imaging Nurse Navigator [facility] Direct Office Dial: [phone]   For scheduling needs, including cancellations and rescheduling, please  call [place], [phone].

## 2021-08-10 ENCOUNTER — Other Ambulatory Visit: Payer: Medicare Other

## 2021-08-10 ENCOUNTER — Other Ambulatory Visit: Payer: Self-pay

## 2021-08-10 DIAGNOSIS — R0600 Dyspnea, unspecified: Secondary | ICD-10-CM | POA: Diagnosis not present

## 2021-08-10 LAB — RENAL FUNCTION PANEL
Albumin: 4 g/dL (ref 3.8–4.8)
BUN/Creatinine Ratio: 21 (ref 10–24)
BUN: 19 mg/dL (ref 8–27)
CO2: 26 mmol/L (ref 20–29)
Calcium: 9.4 mg/dL (ref 8.6–10.2)
Chloride: 101 mmol/L (ref 96–106)
Creatinine, Ser: 0.92 mg/dL (ref 0.76–1.27)
Glucose: 107 mg/dL — ABNORMAL HIGH (ref 70–99)
Phosphorus: 3.2 mg/dL (ref 2.8–4.1)
Potassium: 4.3 mmol/L (ref 3.5–5.2)
Sodium: 139 mmol/L (ref 134–144)
eGFR: 90 mL/min/{1.73_m2} (ref >59)

## 2021-08-14 ENCOUNTER — Telehealth (HOSPITAL_COMMUNITY): Payer: Self-pay | Admitting: Emergency Medicine

## 2021-08-14 ENCOUNTER — Ambulatory Visit (HOSPITAL_BASED_OUTPATIENT_CLINIC_OR_DEPARTMENT_OTHER): Payer: Medicare Other

## 2021-08-14 ENCOUNTER — Other Ambulatory Visit: Payer: Self-pay

## 2021-08-14 DIAGNOSIS — I10 Essential (primary) hypertension: Secondary | ICD-10-CM | POA: Diagnosis not present

## 2021-08-14 DIAGNOSIS — E785 Hyperlipidemia, unspecified: Secondary | ICD-10-CM | POA: Diagnosis not present

## 2021-08-14 DIAGNOSIS — R0600 Dyspnea, unspecified: Secondary | ICD-10-CM | POA: Diagnosis not present

## 2021-08-14 DIAGNOSIS — I340 Nonrheumatic mitral (valve) insufficiency: Secondary | ICD-10-CM | POA: Diagnosis not present

## 2021-08-14 DIAGNOSIS — I517 Cardiomegaly: Secondary | ICD-10-CM | POA: Diagnosis not present

## 2021-08-14 DIAGNOSIS — Z87891 Personal history of nicotine dependence: Secondary | ICD-10-CM | POA: Diagnosis not present

## 2021-08-14 DIAGNOSIS — I2510 Atherosclerotic heart disease of native coronary artery without angina pectoris: Secondary | ICD-10-CM | POA: Diagnosis not present

## 2021-08-14 DIAGNOSIS — Z8249 Family history of ischemic heart disease and other diseases of the circulatory system: Secondary | ICD-10-CM | POA: Diagnosis not present

## 2021-08-14 LAB — ECHOCARDIOGRAM COMPLETE
Area-P 1/2: 3.36 cm2
S' Lateral: 3.5 cm

## 2021-08-14 NOTE — Telephone Encounter (Signed)
Attempted to call patient regarding upcoming cardiac CT appointment. °Left message on voicemail with name and callback number °Linzy Laury RN Navigator Cardiac Imaging °[facility] °[phone] Office °[phone] Cell ° °

## 2021-08-14 NOTE — Telephone Encounter (Signed)
Reaching out to patient to offer assistance regarding upcoming cardiac imaging study; pt verbalizes understanding of appt date/time, parking situation and where to check in, pre-test NPO status and medications ordered, and verified current allergies; name and call back number provided for further questions should they arise Rockwell Alexandria RN Navigator Cardiac Imaging [facility] [phone] office [phone] cell  100mg  metoprolol, holding lasix, lisinopril-HCTZ Denies iv issues Denies claustro

## 2021-08-15 ENCOUNTER — Ambulatory Visit (HOSPITAL_COMMUNITY)
Admission: RE | Admit: 2021-08-15 | Discharge: 2021-08-15 | Disposition: A | Discharge status: Home / Self Care | Payer: Medicare Other | Source: Ambulatory Visit | Attending: Internal Medicine | Admitting: Internal Medicine

## 2021-08-15 ENCOUNTER — Encounter (HOSPITAL_COMMUNITY): Payer: Self-pay

## 2021-08-15 DIAGNOSIS — I2510 Atherosclerotic heart disease of native coronary artery without angina pectoris: Secondary | ICD-10-CM | POA: Insufficient documentation

## 2021-08-15 DIAGNOSIS — E785 Hyperlipidemia, unspecified: Secondary | ICD-10-CM | POA: Diagnosis not present

## 2021-08-15 DIAGNOSIS — I340 Nonrheumatic mitral (valve) insufficiency: Secondary | ICD-10-CM | POA: Diagnosis not present

## 2021-08-15 DIAGNOSIS — I517 Cardiomegaly: Secondary | ICD-10-CM | POA: Diagnosis not present

## 2021-08-15 DIAGNOSIS — Z87891 Personal history of nicotine dependence: Secondary | ICD-10-CM | POA: Diagnosis not present

## 2021-08-15 DIAGNOSIS — R0600 Dyspnea, unspecified: Secondary | ICD-10-CM | POA: Diagnosis not present

## 2021-08-15 DIAGNOSIS — Z8249 Family history of ischemic heart disease and other diseases of the circulatory system: Secondary | ICD-10-CM | POA: Diagnosis not present

## 2021-08-15 DIAGNOSIS — I10 Essential (primary) hypertension: Secondary | ICD-10-CM | POA: Insufficient documentation

## 2021-08-15 MED ORDER — NITROGLYCERIN 0.4 MG SL SUBL
0.8000 mg | SUBLINGUAL_TABLET | Freq: Once | SUBLINGUAL | Status: AC
  Administered 2021-08-15: 0.8 mg via SUBLINGUAL

## 2021-08-15 MED ORDER — IOHEXOL 350 MG/ML SOLN
95.0000 mL | Freq: Once | INTRAVENOUS | Status: AC | PRN
  Administered 2021-08-15: 95 mL via INTRAVENOUS

## 2021-08-15 MED ORDER — NITROGLYCERIN 0.4 MG SL SUBL
SUBLINGUAL_TABLET | SUBLINGUAL | Status: AC
  Filled 2021-08-15: qty 2

## 2021-08-16 ENCOUNTER — Telehealth: Payer: Self-pay | Admitting: *Deleted

## 2021-08-16 ENCOUNTER — Other Ambulatory Visit: Payer: Self-pay

## 2021-08-16 ENCOUNTER — Ambulatory Visit (INDEPENDENT_AMBULATORY_CARE_PROVIDER_SITE_OTHER): Payer: Medicare Other | Admitting: Family Medicine

## 2021-08-16 ENCOUNTER — Other Ambulatory Visit: Payer: Self-pay | Admitting: Internal Medicine

## 2021-08-16 ENCOUNTER — Encounter (HOSPITAL_COMMUNITY): Payer: Self-pay | Admitting: Cardiology

## 2021-08-16 ENCOUNTER — Encounter: Payer: Self-pay | Admitting: Family Medicine

## 2021-08-16 VITALS — BP 142/87 | HR 56 | Temp 98.6°F | Wt 186.8 lb

## 2021-08-16 DIAGNOSIS — Z01812 Encounter for preprocedural laboratory examination: Secondary | ICD-10-CM

## 2021-08-16 DIAGNOSIS — M170 Bilateral primary osteoarthritis of knee: Secondary | ICD-10-CM

## 2021-08-16 DIAGNOSIS — I340 Nonrheumatic mitral (valve) insufficiency: Secondary | ICD-10-CM

## 2021-08-16 DIAGNOSIS — I10 Essential (primary) hypertension: Secondary | ICD-10-CM

## 2021-08-16 MED ORDER — LIDOCAINE HCL (PF) 1 % IJ SOLN
6.0000 mL | Freq: Once | INTRAMUSCULAR | Status: AC
Start: 2021-08-16 — End: 2021-08-16
  Administered 2021-08-16: 6 mL

## 2021-08-16 MED ORDER — METHYLPREDNISOLONE ACETATE 80 MG/ML IJ SUSP
80.0000 mg | Freq: Once | INTRAMUSCULAR | Status: AC
  Administered 2021-08-16: 80 mg via INTRA_ARTICULAR

## 2021-08-16 MED ORDER — METHYLPREDNISOLONE ACETATE 80 MG/ML IJ SUSP
80.0000 mg | Freq: Once | INTRAMUSCULAR | Status: AC
Start: 2021-08-16 — End: 2021-08-16
  Administered 2021-08-16: 80 mg via INTRA_ARTICULAR

## 2021-08-16 NOTE — Telephone Encounter (Signed)
-----   Message from Orbie Pyo, MD sent at 08/15/2021  7:08 PM EDT ----- Looks like his mitral valve is leaking and we need more information.  Lets get a TEE and please let him know.  Thx ----- Message ----- From: Interface, [employer] Sent: 08/14/2021   9:04 PM EDT To: Orbie Pyo, MD

## 2021-08-16 NOTE — Addendum Note (Signed)
Addended by: Laddie Aquas A on: 08/16/2021 11:56 AM   Modules accepted: Orders

## 2021-08-16 NOTE — Assessment & Plan Note (Signed)
Bilateral injection performed today.  See below procedure note.  He tolerated well.  He may be considering hyaluronic acid injections.  Advised him to follow-up with sports medicine if he wishes to pursue this.

## 2021-08-16 NOTE — Patient Instructions (Signed)
It was very nice to see you today!  We Injected your Knees today.  Please follow up in 3 months if needed.   Take care, Dr Jimmey Ralph  PLEASE NOTE:  If you had any lab tests please let us know if you have not heard back within a few days. You may see your results on mychart before we have a chance to review them but we will give you a call once they are reviewed by [place]. If we ordered any referrals today, please let us know if you have not heard from their office within the next week.   Please try these tips to maintain a healthy lifestyle:  Eat at least 3 REAL meals and 1-2 snacks per day.  Aim for no more than 5 hours between eating.  If you eat breakfast, please do so within one hour of getting up.   Each meal should contain half fruits/vegetables, one quarter protein, and one quarter carbs (no bigger than a computer mouse)  Cut down on sweet beverages. This includes juice, soda, and sweet tea.   Drink at least 1 glass of water with each meal and aim for at least 8 glasses per day  Exercise at least 150 minutes every week.

## 2021-08-16 NOTE — Assessment & Plan Note (Signed)
At goal per JNC-8. Continue lisinopril-HCTZ 20-25 once daily.

## 2021-08-16 NOTE — Telephone Encounter (Signed)
Reviewed with patient and his wife.  Scheduled TEE for 08/24/21 with Dr. Cristal Deer.  Reviewed all instructions and sent through MyChart.  He will come for labs pre procedure labs tomorrow.

## 2021-08-16 NOTE — Progress Notes (Signed)
   Frank [name] is a 70 y.o. male who presents today for an office visit.  Assessment/Plan:  Chronic Problems Addressed Today: Essential hypertension At goal per JNC-8. Continue lisinopril-HCTZ 20-25 once daily.   Bilateral primary osteoarthritis of knee Bilateral injection performed today.  See below procedure note.  He tolerated well.  He may be considering hyaluronic acid injections.  Advised him to follow-up with sports medicine if he wishes to pursue this.     Subjective:  HPI:  See A/P for status of chronic conditions.  His bilateral knee arthritis has worsened over the last month.  He had bilateral steroid injection and aspiration done about 3 months ago.  This did well for about 2 months.  He has questions in regards to using gel injections, as he has heard stories from his friends about cortisone injections not working to relieve the problem, whereas gel injections have worked.  He is unsure about the amount of fluid buildup in his knees.   He also notes a sensation of tightness in the muscles, over the past month or so in his upper thigh and around the sides of his knees. He admits to having more pain in his left knee compared to his right knee.         Objective:  Physical Exam: BP (!) 142/87   Pulse (!) 56   Temp 98.6 F (37 C) (Temporal)   Wt 186 lb 12.8 oz (84.7 kg)   SpO2 98%   BMI 34.17 kg/m   Gen: No acute distress, resting comfortably CV: Regular rate and rhythm with no murmurs appreciated Pulm: Normal work of breathing, clear to auscultation bilaterally with no crackles, wheezes, or rhonchi Neuro: Grossly normal, moves all extremities Psych: Normal affect and thought content  Knee Aspiration and Injection Procedure Note   Indication: Symptom relief of bilateral Knee Pain.   Procedure Details  Verbal consent was obtained for the procedure. The left joint was prepped with Betadine. Topical ethyl chloride was applied for anesthesia.    3 ml 1%  lidocaine and 1 ml of 80mg /cc Depo-Medrol was then injected into the joint. With a 25 gauge needlye.The needle was removed and the area cleansed and dressed.   Right joint was then prepped with Betadine.  Topical ethyl chloride was applied.    3 mL of 1% lidocaine and 1 mL of 80 mg/cc of Depo-Medrol was then injected into the joint with a 25 gauge needle.  Needle was was withdrawn.  Sterile bandage was placed.   Complications:  None; patient tolerated the procedure well.      I,Jordan Kelly,acting as a for Neurosurgeon, MD.,have documented all relevant documentation on the behalf of Jacquiline Doe, MD,as directed by  Jacquiline Doe, MD while in the presence of Jacquiline Doe, MD.  I, Jacquiline Doe, MD, have reviewed all documentation for this visit. The documentation on 08/16/21 for the exam, diagnosis, procedures, and orders are all accurate and complete.  08/18/21. Katina Degree, MD 08/16/2021 11:47 AM

## 2021-08-17 ENCOUNTER — Other Ambulatory Visit: Payer: Medicare Other

## 2021-08-17 DIAGNOSIS — Z01812 Encounter for preprocedural laboratory examination: Secondary | ICD-10-CM

## 2021-08-17 DIAGNOSIS — I340 Nonrheumatic mitral (valve) insufficiency: Secondary | ICD-10-CM | POA: Diagnosis not present

## 2021-08-17 DIAGNOSIS — I10 Essential (primary) hypertension: Secondary | ICD-10-CM | POA: Diagnosis not present

## 2021-08-17 LAB — BASIC METABOLIC PANEL
BUN/Creatinine Ratio: 28 — ABNORMAL HIGH (ref 10–24)
BUN: 24 mg/dL (ref 8–27)
CO2: 30 mmol/L — ABNORMAL HIGH (ref 20–29)
Calcium: 9.8 mg/dL (ref 8.6–10.2)
Chloride: 103 mmol/L (ref 96–106)
Creatinine, Ser: 0.86 mg/dL (ref 0.76–1.27)
Glucose: 106 mg/dL — ABNORMAL HIGH (ref 70–99)
Potassium: 3.9 mmol/L (ref 3.5–5.2)
Sodium: 141 mmol/L (ref 134–144)
eGFR: 94 mL/min/{1.73_m2} (ref >59)

## 2021-08-17 LAB — CBC
Hematocrit: 40.2 % (ref 37.5–51.0)
Hemoglobin: 13.6 g/dL (ref 13.0–17.7)
MCH: 31.2 pg (ref 26.6–33.0)
MCHC: 33.8 g/dL (ref 31.5–35.7)
MCV: 92 fL (ref 79–97)
Platelets: 280 10*3/uL (ref 150–450)
RBC: 4.36 x10E6/uL (ref 4.14–5.80)
RDW: 13.1 % (ref 11.6–15.4)
WBC: 13.2 10*3/uL — ABNORMAL HIGH (ref 3.4–10.8)

## 2021-08-21 ENCOUNTER — Telehealth: Payer: Self-pay | Admitting: *Deleted

## 2021-08-21 DIAGNOSIS — Z789 Other specified health status: Secondary | ICD-10-CM

## 2021-08-21 MED ORDER — ASPIRIN EC 81 MG PO TBEC: 81.0000 mg | DELAYED_RELEASE_TABLET | Freq: Every day | ORAL | 3 refills | Status: DC

## 2021-08-21 NOTE — Telephone Encounter (Signed)
-----   Message from Orbie Pyo, MD sent at 08/18/2021 12:59 PM EDT ----- Let him know CT shows very minimal blockages that will be treated with medications.  Please have him start ASA 81 and have him see pharm D for lipid management due to statin allergy.  Thx

## 2021-08-21 NOTE — Telephone Encounter (Signed)
Spoke with patient and his wife.  Reviewed results.  He will begin asa 81 mg daily and I have scheduled him with lipid clinic PharmD.

## 2021-08-24 ENCOUNTER — Encounter (HOSPITAL_COMMUNITY): Payer: Self-pay | Admitting: Cardiology

## 2021-08-24 ENCOUNTER — Ambulatory Visit (HOSPITAL_COMMUNITY): Payer: Medicare Other | Admitting: Anesthesiology

## 2021-08-24 ENCOUNTER — Encounter (HOSPITAL_COMMUNITY)
Admission: RE | Disposition: A | Discharge status: Home / Self Care | Payer: Self-pay | Source: Home / Self Care | Attending: Cardiology

## 2021-08-24 ENCOUNTER — Ambulatory Visit (HOSPITAL_COMMUNITY)
Admission: RE | Admit: 2021-08-24 | Discharge: 2021-08-24 | Disposition: A | Discharge status: Home / Self Care | Payer: Medicare Other | Attending: Cardiology | Admitting: Cardiology

## 2021-08-24 ENCOUNTER — Other Ambulatory Visit: Payer: Self-pay

## 2021-08-24 ENCOUNTER — Ambulatory Visit (HOSPITAL_BASED_OUTPATIENT_CLINIC_OR_DEPARTMENT_OTHER)
Admission: RE | Admit: 2021-08-24 | Discharge: 2021-08-24 | Disposition: A | Discharge status: Home / Self Care | Payer: Medicare Other | Source: Home / Self Care | Attending: Cardiology | Admitting: Cardiology

## 2021-08-24 DIAGNOSIS — R0600 Dyspnea, unspecified: Secondary | ICD-10-CM | POA: Diagnosis not present

## 2021-08-24 DIAGNOSIS — Z87891 Personal history of nicotine dependence: Secondary | ICD-10-CM | POA: Insufficient documentation

## 2021-08-24 DIAGNOSIS — I340 Nonrheumatic mitral (valve) insufficiency: Secondary | ICD-10-CM

## 2021-08-24 DIAGNOSIS — Z882 Allergy status to sulfonamides status: Secondary | ICD-10-CM | POA: Insufficient documentation

## 2021-08-24 DIAGNOSIS — Z8249 Family history of ischemic heart disease and other diseases of the circulatory system: Secondary | ICD-10-CM | POA: Insufficient documentation

## 2021-08-24 DIAGNOSIS — K219 Gastro-esophageal reflux disease without esophagitis: Secondary | ICD-10-CM | POA: Diagnosis not present

## 2021-08-24 DIAGNOSIS — I088 Other rheumatic multiple valve diseases: Secondary | ICD-10-CM | POA: Diagnosis not present

## 2021-08-24 DIAGNOSIS — E785 Hyperlipidemia, unspecified: Secondary | ICD-10-CM | POA: Diagnosis not present

## 2021-08-24 DIAGNOSIS — J45909 Unspecified asthma, uncomplicated: Secondary | ICD-10-CM | POA: Diagnosis not present

## 2021-08-24 DIAGNOSIS — Z79899 Other long term (current) drug therapy: Secondary | ICD-10-CM | POA: Insufficient documentation

## 2021-08-24 DIAGNOSIS — I10 Essential (primary) hypertension: Secondary | ICD-10-CM | POA: Diagnosis not present

## 2021-08-24 HISTORY — PX: TEE WITHOUT CARDIOVERSION: SHX5443

## 2021-08-24 LAB — ECHO TEE
MV M vel: 4.51 m/s
MV Peak grad: 81.4 mmHg
Radius: 0.4 cm

## 2021-08-24 SURGERY — ECHOCARDIOGRAM, TRANSESOPHAGEAL
Anesthesia: Monitor Anesthesia Care

## 2021-08-24 MED ORDER — PHENYLEPHRINE 40 MCG/ML (10ML) SYRINGE FOR IV PUSH (FOR BLOOD PRESSURE SUPPORT)
PREFILLED_SYRINGE | INTRAVENOUS | Status: DC | PRN
  Administered 2021-08-24: 80 ug via INTRAVENOUS

## 2021-08-24 MED ORDER — SODIUM CHLORIDE 0.9 % IV SOLN: INTRAVENOUS | Status: DC

## 2021-08-24 MED ORDER — PROPOFOL 10 MG/ML IV BOLUS
INTRAVENOUS | Status: DC | PRN
  Administered 2021-08-24 (×2): 20 mg via INTRAVENOUS

## 2021-08-24 MED ORDER — BUTAMBEN-TETRACAINE-BENZOCAINE 2-2-14 % EX AERO
INHALATION_SPRAY | CUTANEOUS | Status: DC | PRN
  Administered 2021-08-24: 2 via TOPICAL

## 2021-08-24 MED ORDER — PROPOFOL 500 MG/50ML IV EMUL
INTRAVENOUS | Status: DC | PRN
  Administered 2021-08-24: 125 ug/kg/min via INTRAVENOUS

## 2021-08-24 NOTE — Anesthesia Procedure Notes (Addendum)
Procedure Name: MAC Date/Time: 08/24/2021 8:46 AM Performed by: Rande Brunt, CRNA Pre-anesthesia Checklist: Patient identified, Emergency Drugs available, Suction available and Patient being monitored Patient Re-evaluated:Patient Re-evaluated prior to induction Oxygen Delivery Method: Nasal cannula Preoxygenation: Pre-oxygenation with 100% oxygen Induction Type: IV induction Airway Equipment and Method: Bite block Placement Confirmation: positive ETCO2 and CO2 detector Dental Injury: Teeth and Oropharynx as per pre-operative assessment

## 2021-08-24 NOTE — Interval H&P Note (Signed)
History and Physical Interval Note:  08/24/2021 8:06 AM  Frank [name]  has presented today for surgery, with the diagnosis of MITRAL VALVE REGURGITATION.  The various methods of treatment have been discussed with the patient and family. After consideration of risks, benefits and other options for treatment, the patient has consented to  Procedure(s): TRANSESOPHAGEAL ECHOCARDIOGRAM (TEE) (N/A) as a surgical intervention.  The patient's history has been reviewed, patient examined, no change in status, stable for surgery.  I have reviewed the patient's chart and labs.  Questions were answered to the patient's satisfaction.     Lysle Yero Cristal Deer

## 2021-08-24 NOTE — Progress Notes (Signed)
  Echocardiogram Echocardiogram Transesophageal has been performed.  Delcie Roch 08/24/2021, 9:36 AM

## 2021-08-24 NOTE — Anesthesia Preprocedure Evaluation (Addendum)
Anesthesia Evaluation  Patient identified by MRN, date of birth, ID band Patient awake    Reviewed: Allergy & Precautions, NPO status , Patient's Chart, lab work & pertinent test results, reviewed documented beta blocker date and time   Airway Mallampati: II  TM Distance: >3 FB Neck ROM: Full    Dental no notable dental hx. (+) Teeth Intact, Dental Advisory Given   Pulmonary asthma , former smoker,    Pulmonary exam normal breath sounds clear to auscultation       Cardiovascular hypertension, Pt. on home beta blockers and Pt. on medications Normal cardiovascular exam+ Valvular Problems/Murmurs MR  Rhythm:Regular Rate:Normal  TTE 2022 1. Left ventricular ejection fraction, by estimation, is 55 to 60%. The  left ventricle has normal function. The left ventricle has no regional  wall motion abnormalities. There is mild left ventricular hypertrophy.  Left ventricular diastolic parameters  are consistent with Grade I diastolic dysfunction (impaired relaxation).  2. Right ventricular systolic function is normal. The right ventricular  size is mildly enlarged. Tricuspid regurgitation signal is inadequate for  assessing PA pressure.  3. There is an eccentric, anteriorly directed jet of mitral valve  regurgitation. There is posterior mitral leaflet prolapse. Given degree of  jet eccentricity, cannot exclude small flail segment though it is not well  visualized on this exam.. The mitral  valve is abnormal. Moderate to severe mitral valve regurgitation. No  evidence of mitral stenosis.  4. The aortic valve is grossly normal. There is mild calcification of the  aortic valve. Aortic valve regurgitation is trivial. No aortic stenosis is  present.  5. Aortic dilatation noted. There is mild dilatation of the aortic root,  measuring 41 mm.  6. The inferior vena cava is normal in size with greater than 50%  respiratory variability,  suggesting right atrial pressure of 3 mmHg.    Neuro/Psych  Headaches, negative psych ROS   GI/Hepatic Neg liver ROS, GERD  ,  Endo/Other  negative endocrine ROS  Renal/GU negative Renal ROS  negative genitourinary   Musculoskeletal  (+) Arthritis ,   Abdominal   Peds  Hematology negative hematology ROS (+)   Anesthesia Other Findings   Reproductive/Obstetrics                            Anesthesia Physical Anesthesia Plan  ASA: 3  Anesthesia Plan: MAC   Post-op Pain Management:    Induction: Intravenous  PONV Risk Score and Plan: Propofol infusion and Treatment may vary due to age or medical condition  Airway Management Planned: Natural Airway  Additional Equipment:   Intra-op Plan:   Post-operative Plan:   Informed Consent: I have reviewed the patients History and Physical, chart, labs and discussed the procedure including the risks, benefits and alternatives for the proposed anesthesia with the patient or authorized representative who has indicated his/her understanding and acceptance.     Dental advisory given  Plan Discussed with: CRNA  Anesthesia Plan Comments:         Anesthesia Quick Evaluation

## 2021-08-24 NOTE — Anesthesia Postprocedure Evaluation (Signed)
Anesthesia Post Note  [name]  Procedure(s) Performed: TRANSESOPHAGEAL ECHOCARDIOGRAM (TEE)     Patient location during evaluation: PACU Anesthesia Type: MAC Level of consciousness: awake and alert Pain management: pain level controlled Vital Signs Assessment: post-procedure vital signs reviewed and stable Respiratory status: spontaneous breathing, nonlabored ventilation, respiratory function stable and patient connected to nasal cannula oxygen Cardiovascular status: stable and blood pressure returned to baseline Postop Assessment: no apparent nausea or vomiting Anesthetic complications: no   No notable events documented.  Last Vitals:  Vitals:   08/24/21 0935 08/24/21 0940  BP: 107/79 117/80  Pulse: 61 64  Resp: 20 (!) 23  Temp:  (!) 36.1 C  SpO2: 96% 97%    Last Pain:  Vitals:   08/24/21 0940  TempSrc:   PainSc: 0-No pain                 Ravleen Ries L Elberta Lachapelle

## 2021-08-24 NOTE — Transfer of Care (Signed)
Immediate Anesthesia Transfer of Care Note  [name]  Procedure(s) Performed: TRANSESOPHAGEAL ECHOCARDIOGRAM (TEE)  Patient Location: PACU  Anesthesia Type:MAC  Level of Consciousness: drowsy, patient cooperative and responds to stimulation  Airway & Oxygen Therapy: Patient Spontanous Breathing  Post-op Assessment: Report given to RN, Post -op Vital signs reviewed and stable and Patient moving all extremities X 4  Post vital signs: Reviewed and stable  Last Vitals:  Vitals Value Taken Time  BP 92/62 08/24/21 0920  Temp    Pulse 65 08/24/21 0920  Resp 18 08/24/21 0920  SpO2 96 % 08/24/21 0920  Vitals shown include unvalidated device data.  Last Pain:  Vitals:   08/24/21 0730  TempSrc: Temporal  PainSc: 0-No pain         Complications: No notable events documented.

## 2021-08-24 NOTE — CV Procedure (Signed)
    TRANSESOPHAGEAL ECHOCARDIOGRAM   NAME:  Frank [name]   MRN: [id number] DOB:  [birth year]   ADMIT DATE: 08/24/2021  INDICATIONS: Mitral regurgitation  PROCEDURE:   Informed consent was obtained prior to the procedure. The risks, benefits and alternatives for the procedure were discussed and the patient comprehended these risks.  Risks include, but are not limited to, cough, sore throat, vomiting, nausea, somnolence, esophageal and stomach trauma or perforation, bleeding, low blood pressure, aspiration, pneumonia, infection, trauma to the teeth and death.    Procedural time out performed. The oropharynx was anesthetized with topical 1% cetacaine.    Patient received monitored anesthesia care under the supervision of Dr. Armond Hang. Patient received a total of 310.6 mg propofol during the procedure.  The transesophageal probe was inserted in the esophagus and stomach without difficulty and multiple views were obtained.    COMPLICATIONS:    There were no immediate complications.  FINDINGS:  LEFT VENTRICLE: EF = 55-60%. No regional wall motion abnormalities.  RIGHT VENTRICLE: Normal size and function.   LEFT ATRIUM: No thrombus/mass.  LEFT ATRIAL APPENDAGE: No thrombus/mass.   RIGHT ATRIUM: No thrombus/mass.  AORTIC VALVE:  Trileaflet. Trivial regurgitation. No vegetation.  MITRAL VALVE:    There is mild prolapse of the P2 segment of the posterior mitral valve leaflet. No flail seen. There is no significant diastolic flow reversal in the pulmonary veins.PISA performed but difficult given eccentricity of the jet. Moderate MR. No vegetation.  TRICUSPID VALVE: Normal structure. Trivial regurgitation. No vegetation.  PULMONIC VALVE: Grossly normal structure. Trivial regurgitation. No apparent vegetation.  INTERATRIAL SEPTUM: No PFO or ASD seen by color Doppler.Aneurysmal atrial septum.  PERICARDIUM: No effusion noted.  DESCENDING AORTA: Mild diffuse plaque  seen   CONCLUSION: Mild prolapse of P2 segment of mitral valve with moderate MR on this study.    Jodelle Red, MD, PhD [facility]  [address], [address] [place], [place] [address] [phone]   9:15 AM

## 2021-08-28 ENCOUNTER — Encounter (HOSPITAL_COMMUNITY): Payer: Self-pay | Admitting: Cardiology

## 2021-09-04 ENCOUNTER — Ambulatory Visit: Payer: Medicare Other | Admitting: Pharmacist

## 2021-09-04 ENCOUNTER — Other Ambulatory Visit: Payer: Self-pay

## 2021-09-04 ENCOUNTER — Telehealth: Payer: Self-pay | Admitting: Internal Medicine

## 2021-09-04 ENCOUNTER — Other Ambulatory Visit: Payer: Self-pay | Admitting: Internal Medicine

## 2021-09-04 DIAGNOSIS — E785 Hyperlipidemia, unspecified: Secondary | ICD-10-CM | POA: Diagnosis not present

## 2021-09-04 DIAGNOSIS — I340 Nonrheumatic mitral (valve) insufficiency: Secondary | ICD-10-CM

## 2021-09-04 DIAGNOSIS — Z01812 Encounter for preprocedural laboratory examination: Secondary | ICD-10-CM

## 2021-09-04 LAB — LIPID PANEL
Chol/HDL Ratio: 5.3 ratio — ABNORMAL HIGH (ref 0.0–5.0)
Cholesterol, Total: 197 mg/dL (ref 100–199)
HDL: 37 mg/dL — ABNORMAL LOW (ref >39)
LDL Chol Calc (NIH): 139 mg/dL — ABNORMAL HIGH (ref 0–99)
Triglycerides: 113 mg/dL (ref 0–149)
VLDL Cholesterol Cal: 21 mg/dL (ref 5–40)

## 2021-09-04 NOTE — Progress Notes (Signed)
Patient ID: [name]                 DOB: 04-Feb-1951                    MRN: [id number]     HPI: Frank [name] is a 70 y.o. male patient referred to lipid clinic by Dr. Lynnette [name]. PMH is significant for HTN, family hx of CAD, asthma and CAC of 75 (41st percentile), Minimal mixed atherosclerotic plaque in the proximal LAD, <25% stenosis, Minimal mixed atherosclerotic plaque in the proximal L circumflex, <25% stenosis. Mild mixed atherosclerotic plaque in the ostial second OM, 25-49% stenosis. Minimal scattered plaque in the dominant, distal L circumflex artery <25% stenosis.  Patient presents today to lipid clinic. He is accompanied by his wife. Patient has not had a lipid panel done in several years. He has only been on atorvastatin in the past. Had joint aches. Patient has arthritis at baseline. Weakness is ice cream. He is pre-diabetic. Eats a decent amount of fast food. 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  Current Medications: none Intolerances: atorvastatin (join pain) Risk Factors: CAC 75, HTN, family hx LDL goal: <70  Diet: breakfast:scrambled eggs, toast, country ham, chicken biscuit, cereal  Lunch: banana or ham sandwich, apples, hamburger, fried chicken Dinner: grilled chicken, broccoli w/cheese, corn, beans, squash, potatoes, carrots, peas, cabbage- but drips in honey mustard.  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     albuterol (VENTOLIN HFA) 108 (90 Base) MCG/ACT inhaler Inhale 2 puffs into the lungs every 6 (six) hours as needed for wheezing or shortness of breath. 8 g 0   ascorbic acid (VITAMIN C) 500 MG tablet Take 500 mg by mouth daily.     aspirin EC 81 MG tablet Take 1 tablet (81 mg total) by mouth daily. Swallow whole. 90 tablet 3   diclofenac (VOLTAREN) 75 MG EC tablet TAKE 1 TABLET BY MOUTH TWICE A DAY (Patient taking differently: Take 75 mg by mouth See admin instructions. Take 75 mg daily, may take a second 75 mg dose as needed for pain) 60 tablet 5   diclofenac sodium (VOLTAREN) 1 % GEL Apply topically to affected area qid (Patient taking differently: Apply 1 application topically 4 (four) times daily as needed (pain).) 100 g 1   fluticasone (FLONASE) 50 MCG/ACT nasal spray Place 1 spray into both nostrils daily as needed for allergies or rhinitis.     furosemide (LASIX) 20 MG tablet Take 1 tablet (20 mg total) by mouth daily. 90 tablet 3   lisinopril-hydrochlorothiazide (ZESTORETIC) 20-25 MG tablet TAKE 1 TABLET BY MOUTH EVERY DAY 90 tablet 1   tamsulosin (FLOMAX) 0.4 MG CAPS capsule TAKE 1 CAPSULE BY MOUTH EVERY DAY 90 capsule 1   triamcinolone (KENALOG) 0.1 % paste Apply to affected area 1-2 times daily (Patient taking differently:  Use as directed 1 application in the mouth or throat 2 (two) times daily as needed (mouth sores).) 5 g 12   Zinc 50 MG CAPS Take 50 mg by mouth daily.     No current facility-administered medications on file prior to visit.    Allergies  Allergen Reactions   Atorvastatin Other (See Comments)    Joint pain   Bactrim [Sulfamethoxazole-Trimethoprim] Hives    Assessment/Plan:  1. Hyperlipidemia - No recent LDL available. Patient is fasting today, therefore will get lipid labs done this AM. Will call patient tomorrow with results. Will more than likely recommend rosuvastatin which the patient and I already discussed.  The role statins play in decreased cardiovascular risk was discussed in detail. We discussed decreasing fast food intake, limiting sweets, decrease sweet tea intake/amount of sugar in tea, limiting potatoes and using oil/vinegar on vegetables/salads. Further information and resources given in AVS.   Thank you,   Mckinzey Entwistle D Marai Teehan, Pharm.D, BCPS, CPP  [facility]  [address], [place], [place] [address]  Phone: [phone]; Fax: [phone]

## 2021-09-04 NOTE — Telephone Encounter (Signed)
I reviewed the TEE results with Dr. Jacques Navy.  There may be flail and more than moderate mitral regurgitation on the TEE imaging.  I discussed this with the patient.  We will obtain a cardiac MRI for further evaluation.  We may require right heart catheterization as well.  Patient and wife agree with plan.

## 2021-09-04 NOTE — Patient Instructions (Addendum)
Please try to cut out fast food. Consider using oil and vinegar for your salad dressing. Work on decreasing your sweet tea intake.   Good resources:  Eat. Sleep. Move. Breath: The Beginner's Guide to Living A Healthy Lifestyle  Eat, Drink, and Be Healthy: The [employer] Guide to [employer] (2007 version)  Zoe podcast   Tips for living a healthier life     Building a Healthy and Balanced Diet Make most of your meal vegetables and fruits -  of your plate. Aim for color and variety, and remember that potatoes don't count as vegetables on the Healthy Eating Plate because of their negative impact on blood sugar.  Go for whole grains -  of your plate. Whole and intact grains--whole wheat, barley, wheat berries, quinoa, oats, brown rice, and foods made with them, such as whole wheat pasta--have a milder effect on blood sugar and insulin than white bread, white rice, and other refined grains.  Protein power -  of your plate. Fish, poultry, beans, and nuts are all healthy, versatile protein sources--they can be mixed into salads, and pair well with vegetables on a plate. Limit red meat, and avoid processed meats such as bacon and sausage.  Healthy plant oils - in moderation. Choose healthy vegetable oils like olive, canola, soy, corn, sunflower, peanut, and others, and avoid partially hydrogenated oils, which contain unhealthy trans fats. Remember that low-fat does not mean "healthy."  Drink water, coffee, or tea. Skip sugary drinks, limit milk and dairy products to one to two servings per day, and limit juice to a small glass per day.  Stay active. The red figure running across the Healthy Eating Plate's placemat is a reminder that staying active is also important in weight control.  The main message of the Healthy Eating Plate is to focus on diet quality:  The type of carbohydrate in the diet is more important than the amount of carbohydrate in the diet, because some  sources of carbohydrate--like vegetables (other than potatoes), fruits, whole grains, and beans--are healthier than others. The Healthy Eating Plate also advises consumers to avoid sugary beverages, a major source of calories--usually with little nutritional value--in the American diet. The Healthy Eating Plate encourages consumers to use healthy oils, and it does not set a maximum on the percentage of calories people should get each day from healthy sources of fat. In this way, the Healthy Eating Plate recommends the opposite of the low-fat message promoted for decades by the USDA.  CueTune.com.ee  SUGAR  Sugar is a huge problem in the modern day diet. Sugar is a big contributor to heart disease, diabetes, high triglyceride levels, fatty liver disease and obesity. Sugar is hidden in almost all packaged foods/beverages. Added sugar is extra sugar that is added beyond what is naturally found and has no nutritional benefit for your body. The American Heart Association recommends limiting added sugars to no more than 25g for women and 36 grams for men per day. There are many names for sugar including maltose, sucrose (names ending in "ose"), high fructose corn syrup, molasses, cane sugar, corn sweetener, raw sugar, syrup, honey or fruit juice concentrate.   One of the best ways to limit your added sugars is to stop drinking sweetened beverages such as soda, sweet tea, and fruit juice.  There is 65g of added sugars in one 20oz bottle of Coke! That is equal to 7.5 donuts.   Pay attention and read all nutrition facts labels. Below is an examples  of a nutrition facts label. The #1 is showing you the total sugars where the # 2 is showing you the added sugars. This one serving has almost the max amount of added sugars per day!     20 oz Soda 65g Sugar = 7.5 Glazed Donuts  16oz Energy  Drink 54g Sugar = 6.5 Glazed Donuts  Large Sweet  Tea 38g  Sugar = 4 Glazed Donuts  20oz Sports  Drink 34g Sugar = 3.5 Glazed Donuts  8oz Chocolate Milk 24g Sugar =2.5 Glazed Donuts  8oz Orange  Juice 21g Sugar = 2 Glazed Donuts  1 Juice Box 14g Sugar = 1.5 Glazed Donuts  16oz Water= NO SUGAR!!  EXERCISE  Exercise is good. We've all heard that. In an ideal world, we would all have time and resources to get plenty of it. When you are active, your heart pumps more efficiently and you will feel better.  Multiple studies show that even walking regularly has benefits that include living a longer life. The American Heart Association recommends 150 minutes per week of exercise (30 minutes per day most days of the week). You can do this in any increment you wish. Nine or more 10-minute walks count. So does an hour-long exercise class. Break the time apart into what will work in your life. Some of the best things you can do include walking briskly, jogging, cycling or swimming laps. Not everyone is ready to "exercise." Sometimes we need to start with just getting active. Here are some easy ways to be more active throughout the day:  Take the stairs instead of the elevator  Go for a 10-15 minute walk during your lunch break (find a friend to make it more enjoyable)  When shopping, park at the back of the parking lot  If you take public transportation, get off one stop early and walk the extra distance  Pace around while making phone calls  Check with your doctor if you aren't sure what your limitations may be. Always remember to drink plenty of water when doing any type of exercise. Don't feel like a failure if you're not getting the 90-150 minutes per week. If you started by being a couch potato, then just a 10-minute walk each day is a huge improvement. Start with little victories and work your way up.   HEALTHY EATING TIPS  When looking to improve your eating habits, whether to lose weight, lower blood pressure or just be healthier, it helps to know  what a serving size is.   Grains 1 slice of bread,  bagel,  cup pasta or rice  Vegetables 1 cup fresh or raw vegetables,  cup cooked or canned Fruits 1 piece of medium sized fruit,  cup canned,   Meats/Proteins  cup dried       1 oz meat, 1 egg,  cup cooked beans, nuts or seeds  Dairy        Fats Individual yogurt container, 1 cup (8oz)    1 teaspoon margarine/butter or vegetable  milk or milk alternative, 1 slice of cheese          oil; 1 tablespoon mayonnaise or salad dressing                  Plan ahead: make a menu of the meals for a week then create a grocery list to go with that menu. Consider meals that easily stretch into a night of leftovers, such as stews or casseroles. Or consider making two  of your favorite meal and put one in the freezer for another night. Try a night or two each week that is "meatless" or "no cook" such as salads. When you get home from the grocery store wash and prepare your vegetables and fruits. Then when you need them they are ready to go.   Tips for going to the grocery store:  Buy store or generic brands  Check the weekly ad from your store on-line or in their in-store flyer  Look at the unit price on the shelf tag to compare/contrast the costs of different items  Buy fruits/vegetables in season  Carrots, bananas and apples are low-cost, naturally healthy items  If meats or frozen vegetables are on sale, buy some extras and put in your freezer  Limit buying prepared or "ready to eat" items, even if they are pre-made salads or fruit snacks  Do not shop when you're hungry  Foods at eye level tend to be more expensive. Look on the high and low shelves for deals.  Consider shopping at the farmer's market for fresh foods in season.  Avoid the cookie and chip aisles (these are expensive, high in calories and low in nutritional value). Shop on the outside of the grocery store.  Healthy food preparations:  If you can't get lean hamburger, be sure to  drain the fat when cooking  Steam, saut (in olive oil), grill or bake foods  Experiment with different seasonings to avoid adding salt to your foods. Kosher salt, sea salt and Himalayan salt are all still salt and should be avoided. Try seasoning food with onion, garlic, thyme, rosemary, basil ect. Onion powder or garlic powder is ok. Avoid if it says salt (ie garlic salt).

## 2021-09-05 ENCOUNTER — Telehealth: Payer: Self-pay | Admitting: Pharmacist

## 2021-09-05 DIAGNOSIS — E785 Hyperlipidemia, unspecified: Secondary | ICD-10-CM

## 2021-09-05 MED ORDER — ROSUVASTATIN CALCIUM 20 MG PO TABS: 20.0000 mg | ORAL_TABLET | Freq: Every day | ORAL | 3 refills | Status: DC

## 2021-09-05 NOTE — Telephone Encounter (Signed)
Reviewed lipid labs with patient and his wife. Will start rosuvastatin 20mg  daily. Follow up labs 11/27/21.

## 2021-09-06 NOTE — Addendum Note (Signed)
Addended by: Lendon Ka on: 09/06/2021 10:31 AM   Modules accepted: Orders

## 2021-09-06 NOTE — Telephone Encounter (Signed)
Order placed for cardiac MRI and H&H.  Message to [facility].  Per previous note from Dr. Lynnette Caffey patient is aware of this plan.

## 2021-09-27 ENCOUNTER — Other Ambulatory Visit: Payer: Self-pay | Admitting: Family Medicine

## 2021-09-29 ENCOUNTER — Other Ambulatory Visit: Payer: Self-pay | Admitting: Family Medicine

## 2021-10-05 ENCOUNTER — Other Ambulatory Visit: Payer: Self-pay | Admitting: *Deleted

## 2021-10-05 ENCOUNTER — Telehealth: Payer: Self-pay

## 2021-10-05 MED ORDER — LISINOPRIL-HYDROCHLOROTHIAZIDE 20-25 MG PO TABS: 1.0000 | ORAL_TABLET | Freq: Every day | ORAL | 1 refills | Status: DC

## 2021-10-05 NOTE — Telephone Encounter (Signed)
LAST APPOINTMENT DATE: 08/16/21  NEXT APPOINTMENT DATE: none  MEDICATION:lisinopril-hydrochlorothiazide (ZESTORETIC) 20-25 MG tablet  PHARMACY: [id number] - [place], [place] - [address]

## 2021-10-05 NOTE — Telephone Encounter (Signed)
Rx send to pharmacy  

## 2021-10-10 ENCOUNTER — Telehealth (HOSPITAL_COMMUNITY): Payer: Self-pay | Admitting: Emergency Medicine

## 2021-10-10 NOTE — Telephone Encounter (Signed)
Reaching out to patient to offer assistance regarding upcoming cardiac imaging study; pt verbalizes understanding of appt date/time, parking situation and where to check in, and verified current allergies; name and call back number provided for further questions should they arise [place] RN Navigator Cardiac Imaging [facility] [phone] office [phone] cell  Arrival time 1030 Denies implants Denies claustro Holding lasix

## 2021-10-11 ENCOUNTER — Other Ambulatory Visit: Payer: Self-pay

## 2021-10-11 ENCOUNTER — Ambulatory Visit (HOSPITAL_COMMUNITY)
Admission: RE | Admit: 2021-10-11 | Discharge: 2021-10-11 | Disposition: A | Discharge status: Home / Self Care | Payer: Medicare Other | Source: Ambulatory Visit | Attending: Internal Medicine | Admitting: Internal Medicine

## 2021-10-11 DIAGNOSIS — I340 Nonrheumatic mitral (valve) insufficiency: Secondary | ICD-10-CM | POA: Insufficient documentation

## 2021-10-11 DIAGNOSIS — Z01812 Encounter for preprocedural laboratory examination: Secondary | ICD-10-CM

## 2021-10-11 MED ORDER — GADOBUTROL 1 MMOL/ML IV SOLN
8.0000 mL | Freq: Once | INTRAVENOUS | Status: AC | PRN
  Administered 2021-10-11: 13:00:00 8 mL via INTRAVENOUS

## 2021-10-31 ENCOUNTER — Other Ambulatory Visit: Payer: Self-pay

## 2021-10-31 ENCOUNTER — Encounter: Payer: Self-pay | Admitting: Internal Medicine

## 2021-10-31 ENCOUNTER — Ambulatory Visit: Payer: Medicare Other | Admitting: Internal Medicine

## 2021-10-31 ENCOUNTER — Other Ambulatory Visit: Payer: Self-pay | Admitting: Internal Medicine

## 2021-10-31 VITALS — BP 118/70 | HR 72 | Ht 63.0 in | Wt 188.2 lb

## 2021-10-31 DIAGNOSIS — I340 Nonrheumatic mitral (valve) insufficiency: Secondary | ICD-10-CM | POA: Diagnosis not present

## 2021-10-31 DIAGNOSIS — E785 Hyperlipidemia, unspecified: Secondary | ICD-10-CM

## 2021-10-31 MED ORDER — SODIUM CHLORIDE 0.9% FLUSH: 3.0000 mL | Freq: Two times a day (BID) | INTRAVENOUS | Status: DC

## 2021-10-31 NOTE — Patient Instructions (Addendum)
Medication Instructions:  Your physician recommends that you continue on your current medications as directed. Please refer to the Current Medication list given to you today.  *If you need a refill on your cardiac medications before your next appointment, please call your pharmacy*   Lab Work: BMET, CBC today  If you have labs (blood work) drawn today and your tests are completely normal, you will receive your results only by: MyChart Message (if you have MyChart) OR A paper copy in the mail If you have any lab test that is abnormal or we need to change your treatment, we will call you to review the results.   Testing/Procedures: KARAM DUNSON  10/31/2021  You are scheduled for a Cardiac Catheterization on Wednesday, January 18 with Dr. Alverda Skeans.  1. Please arrive at the [facility] (Main Entrance A) at [facility]: [address] [place], [place] [address] at 8:00 AM (This time is two hours before your procedure to ensure your preparation). Free valet parking service is available.   Special note: Every effort is made to have your procedure done on time. Please understand that emergencies sometimes delay scheduled procedures.  2. Diet: Do not eat solid foods after midnight.  The patient may have clear liquids until 5am upon the day of the procedure.  3. Labs: You will need to have blood drawn today.  4. Medication instructions in preparation for your procedure: Take aspirin 81 mg the morning of your procedure. No other changes to medications are required.  Do not take your lasix the morning of the procedure.    Contrast Allergy: No   5. Plan for one night stay--bring personal belongings. 6. Bring a current list of your medications and current insurance cards. 7. You MUST have a responsible person to drive you home. 8. Someone MUST be with you the first 24 hours after you arrive home or your discharge will be delayed. 9. Please wear clothes that are easy to get on  and off and wear slip-on shoes.  Thank you for allowing [place] to care for you!   -- [facility]    Follow-Up: At [facility], you and your health needs are our priority.  As part of our continuing mission to provide you with exceptional heart care, we have created designated Provider Care Teams.  These Care Teams include your primary Cardiologist (physician) and Advanced Practice Providers (APPs -  Physician Assistants and Nurse Practitioners) who all work together to provide you with the care you need, when you need it.  We recommend signing up for the patient portal called "MyChart".  Sign up information is provided on this After Visit Summary.  MyChart is used to connect with patients for Virtual Visits (Telemedicine).  Patients are able to view lab/test results, encounter notes, upcoming appointments, etc.  Non-urgent messages can be sent to your provider as well.   To learn more about what you can do with MyChart, go to [email or web address].    Your next appointment:   3 month(s)  The format for your next appointment:   In Person  Provider:   Orbie Pyo, MD   Other Instructions You have been referred to Pharm D for lipid management.  You have been referred to Dr. Zebedee Iba at [facility] Cardioth[facility]acic surgery.     Your physician has requested that you have a cardiac catheterization. Cardiac catheterization is used to diagnose and/or treat various heart conditions. Doctors may recommend this procedure for a number  of different reasons. The most common reason is to evaluate chest pain. Chest pain can be a symptom of coronary artery disease (CAD), and cardiac catheterization can show whether plaque is narrowing or blocking your hearts arteries. This procedure is also used to evaluate the valves, as well as measure the blood flow and oxygen levels in different parts of your heart. For further information please visit [email or web address] Please follow  instruction sheet, as given

## 2021-10-31 NOTE — Progress Notes (Signed)
Cardiology Office Note:    Date:  10/31/2021   ID:  Frank [name], DOB September 21, 1951, MRN [id number]  PCP:  Frank Barrack, MD   [place] Providers Cardiologist:  [name], MD Referring MD: Frank Barrack, MD   Chief Complaint/Reason for Referral: Follow-up regarding dyspnea  ASSESSMENT:    Nonrheumatic mitral valve regurgitation  Dyslipidemia  PLAN:    In order of problems listed above:  1.   The patient is symptomatic and has imaging evidence of severe mitral regurgitation (though I am unable to appreciate a prominent mitral regurgitation murmur on examination today).  We had a conversation about the pathophysiology of mitral regurgitation and the fact that this will likely progress and lead to progressive LV dilatation and dysfunction.  He agrees with referral for definitive therapy.  I will refer him for coronary angiography and right heart catheterization study for a preoperative assessment.  I will refer him to Dr. [name] at [facility] for further recommendations.  Follow up in 3 months.  2.  The patient has failed atorvastatin and Crestor due to myalgias; I will refer him back to pharmacy for recommendations regarding alternative therapies.     Shared Decision Making/Informed Consent The risks [stroke (1 in 1000), death (1 in 1000), kidney failure [usually temporary] (1 in 500), bleeding (1 in 200), allergic reaction [possibly serious] (1 in 200)], benefits (diagnostic support and management of coronary artery disease) and alternatives of a cardiac catheterization were discussed in detail with Frank [name] and he is willing to proceed.   Dispo:  No follow-ups on file.     Medication Adjustments/Labs and Tests Ordered: Current medicines are reviewed at length with the patient today.  Concerns regarding medicines are outlined above.   Tests Ordered: No orders of the defined types were placed in this encounter.   Medication Changes: No orders of the defined  types were placed in this encounter.   History of Present Illness:    FOCUSED CARDIOVASCULAR PROBLEM LIST:   1.  Hypertension 2.  Family history of early coronary artery disease 3.  Prior remote smoking (~0.5 ppd x 2 years)  4.  Asthma 5.  Severe mitral regurgitation:  TEE imaging demonstrated at least moderate mitral regurgitation with difficult to assess eccentric jet with prolapse of the P2 segment (likely severe with Coanda effect); confirmed by by cardiac MRI with regurgitant fraction of 48%   The patient is a 71 y.o. male with the indicated medical history here for a discussion regarding his cardiovascular imaging.  When I saw him last I referred him for coronary CTA which demonstrated mild obstructive coronary artery disease.  I also referred him for an echocardiogram which demonstrated at least moderate mitral regurgitation but this was difficult to assess due to jet eccentricity.  He was then referred for cardiac MRI which demonstrated severe mitral regurgitation.  He tells me he is still quite dyspneic with exertion.  He is able to do some of his activities of daily living but walking up a flight of stairs or up a hill makes him quite short of breath.  He denies any shortness of breath at rest.  He denies any exertional angina.  He denies any palpitations, paroxysmal nocturnal dyspnea, orthopnea.  He has required no hospitalizations or emergency room visits.     Previous Medical History: Past Medical History:  Diagnosis Date   Allergy    Asthma    GERD (gastroesophageal reflux disease)    Headache(784.0)  Hypertension      Current Medications: No outpatient medications have been marked as taking for the 10/31/21 encounter (Office Visit) with [name], MD.     Allergies:    Atorvastatin and Bactrim [sulfamethoxazole-trimethoprim]   Social History:   Social History   Tobacco Use   Smoking status: Former   Smokeless tobacco: Never   Tobacco comments:    quit  in 1972  Substance Use Topics   Alcohol use: No   Drug use: No     Family Hx: Family History  Problem Relation Age of Onset   Kidney disease Mother    COPD Father    Pneumonia Father    Cardiomyopathy Brother    Hypertension Sister    Heart disease Brother    Hypertension Brother    Hypertension Brother    Hypertension Sister    Diabetes Sister    Osteoarthritis Sister      Review of Systems:   Please see the history of present illness.    All other systems reviewed and are negative.  EKGs/Labs/Other Test Reviewed:    EKG:  EKG today: Sinus rhythm with occasional PVCs; prior EKG: Sinus rhythm with incomplete right bundle branch block  Prior CV studies:   CMR 12/22 IMPRESSION: 1.  Severe mitral regurgitation (regurgitant fraction 48%)   2. Subendocardial late gadolinium enhancement in LV basal lateral wall, consistent with small infarct   3. RV insertion site LGE, which is a nonspecific finding often seen in setting of elevated pulmonary pressures   4. Normal LV size, mild hypertrophy, and normal systolic function (EF XX123456)   5.  Normal RV size and low normal systolic function (EF Q000111Q)  TEE 10/22  1. Left ventricular ejection fraction, by estimation, is 55 to 60%. The  left ventricle has normal function. The left ventricle has no regional  wall motion abnormalities.   2. Right ventricular systolic function is normal. The right ventricular  size is normal.   3. No left atrial/left atrial appendage thrombus was detected.   4. The mitral valve is abnormal. Moderate mitral valve regurgitation. No  evidence of mitral stenosis. There is mild holosystolic prolapse of the  middle scallop of the posterior leaflet of the mitral valve.   5. The aortic valve is tricuspid. Aortic valve regurgitation is trivial.  No aortic stenosis is present.   6. There is mild (Grade II) plaque involving the descending aorta.   Conclusion(s)/Recommendation(s): There is mild prolapse of  the P2 segment  of the posterior mitral valve leaflet. No flail seen. There is no  significant diastolic flow reversal in the pulmonary veins. PISA performed  but difficult given eccentricity of the   jet. Moderate eccentric, anteriorly directed MR. No vegetation.   TEE 10/22  1. Left ventricular ejection fraction, by estimation, is 55 to 60%. The  left ventricle has normal function. The left ventricle has no regional  wall motion abnormalities. There is mild left ventricular hypertrophy.  Left ventricular diastolic parameters  are consistent with Grade I diastolic dysfunction (impaired relaxation).   2. Right ventricular systolic function is normal. The right ventricular  size is mildly enlarged. Tricuspid regurgitation signal is inadequate for  assessing PA pressure.   3. There is an eccentric, anteriorly directed jet of mitral valve  regurgitation. There is posterior mitral leaflet prolapse. Given degree of  jet eccentricity, cannot exclude small flail segment though it is not well  visualized on this exam.. The mitral  valve is abnormal. Moderate  to severe mitral valve regurgitation. No  evidence of mitral stenosis.   4. The aortic valve is grossly normal. There is mild calcification of the  aortic valve. Aortic valve regurgitation is trivial. No aortic stenosis is  present.   5. Aortic dilatation noted. There is mild dilatation of the aortic root,  measuring 41 mm.   6. The inferior vena cava is normal in size with greater than 50%  respiratory variability, suggesting right atrial pressure of 3 mmHg.     Imaging studies that I have independently reviewed today: Cardiac MRI, TEE, TTE, and coronary CTA  Recent Labs: 06/23/2021: ALT 13 08/17/2021: BUN 24; Creatinine, Ser 0.86; Hemoglobin 13.6; Platelets 280; Potassium 3.9; Sodium 141   Recent Lipid Panel Lab Results  Component Value Date/Time   CHOL 197 09/04/2021 09:33 AM   TRIG 113 09/04/2021 09:33 AM   HDL 37 (L)  09/04/2021 09:33 AM   LDLCALC 139 (H) 09/04/2021 09:33 AM   LDLDIRECT 118.0 08/10/2015 08:15 AM    Risk Assessment/Calculations:           Physical Exam:    VS:  BP 118/70    Pulse 72    Ht 5\' 3"  (1.6 m)    Wt 188 lb 3.2 oz (85.4 kg)    SpO2 99%    BMI 33.34 kg/m    Wt Readings from Last 3 Encounters:  10/31/21 188 lb 3.2 oz (85.4 kg)  08/24/21 185 lb (83.9 kg)  08/16/21 186 lb 12.8 oz (84.7 kg)    GENERAL:  No apparent distress, AOx3 HEENT:  No carotid bruits, +2 carotid impulses, no scleral icterus CAR: RRR  no murmurs, gallops, rubs, or thrills RES:  Clear to auscultation bilaterally ABD:  Soft, nontender, nondistended, positive bowel sounds x 4 VASC:  +2 radial pulses, +2 carotid pulses, palpable pedal pulses NEURO:  CN 2-12 grossly intact; motor and sensory grossly intact PSYCH:  No active depression or anxiety EXT:  No edema, ecchymosis, or cyanosis  Signed, [name], MD  10/31/2021 1:18 PM    [address], [place], [place]  [address] Phone: [phone]; Fax: [phone]   Note:  This document was prepared using Dragon voice recognition software and may include unintentional dictation errors.

## 2021-10-31 NOTE — H&P (View-Only) (Signed)
Cardiology Office Note:    Date:  10/31/2021   ID:  Frank [name], DOB 1951-05-19, MRN [id number]  PCP:  Frank Barrack, MD   [place] Providers Cardiologist:  [name], MD Referring MD: Frank Barrack, MD   Chief Complaint/Reason for Referral: Follow-up regarding dyspnea  ASSESSMENT:    Nonrheumatic mitral valve regurgitation  Dyslipidemia  PLAN:    In order of problems listed above:  1.   The patient is symptomatic and has imaging evidence of severe mitral regurgitation (though I am unable to appreciate a prominent mitral regurgitation murmur on examination today).  We had a conversation about the pathophysiology of mitral regurgitation and the fact that this will likely progress and lead to progressive LV dilatation and dysfunction.  He agrees with referral for definitive therapy.  I will refer him for coronary angiography and right heart catheterization study for a preoperative assessment.  I will refer him to Dr. Cheree [name] at [facility] for further recommendations.  Follow up in 3 months.  2.  The patient has failed atorvastatin and Crestor due to myalgias; I will refer him back to pharmacy for recommendations regarding alternative therapies.     Shared Decision Making/Informed Consent The risks [stroke (1 in 1000), death (1 in 1000), kidney failure [usually temporary] (1 in 500), bleeding (1 in 200), allergic reaction [possibly serious] (1 in 200)], benefits (diagnostic support and management of coronary artery disease) and alternatives of a cardiac catheterization were discussed in detail with [name] and he is willing to proceed.   Dispo:  No follow-ups on file.     Medication Adjustments/Labs and Tests Ordered: Current medicines are reviewed at length with the patient today.  Concerns regarding medicines are outlined above.   Tests Ordered: No orders of the defined types were placed in this encounter.   Medication Changes: No orders of the defined  types were placed in this encounter.   History of Present Illness:    FOCUSED CARDIOVASCULAR PROBLEM LIST:   1.  Hypertension 2.  Family history of early coronary artery disease 3.  Prior remote smoking (~0.5 ppd x 2 years)  4.  Asthma 5.  Severe mitral regurgitation:  TEE imaging demonstrated at least moderate mitral regurgitation with difficult to assess eccentric jet with prolapse of the P2 segment (likely severe with Coanda effect); confirmed by by cardiac MRI with regurgitant fraction of 48%   The patient is a 71 y.o. male with the indicated medical history here for a discussion regarding his cardiovascular imaging.  When I saw him last I referred him for coronary CTA which demonstrated mild obstructive coronary artery disease.  I also referred him for an echocardiogram which demonstrated at least moderate mitral regurgitation but this was difficult to assess due to jet eccentricity.  He was then referred for cardiac MRI which demonstrated severe mitral regurgitation.  He tells me he is still quite dyspneic with exertion.  He is able to do some of his activities of daily living but walking up a flight of stairs or up a hill makes him quite short of breath.  He denies any shortness of breath at rest.  He denies any exertional angina.  He denies any palpitations, paroxysmal nocturnal dyspnea, orthopnea.  He has required no hospitalizations or emergency room visits.     Previous Medical History: Past Medical History:  Diagnosis Date   Allergy    Asthma    GERD (gastroesophageal reflux disease)    Headache(784.0)  Hypertension      Current Medications: No outpatient medications have been marked as taking for the 10/31/21 encounter (Office Visit) with [name], MD.     Allergies:    Atorvastatin and Bactrim [sulfamethoxazole-trimethoprim]   Social History:   Social History   Tobacco Use   Smoking status: Former   Smokeless tobacco: Never   Tobacco comments:    quit  in 1972  Substance Use Topics   Alcohol use: No   Drug use: No     Family Hx: Family History  Problem Relation Age of Onset   Kidney disease Mother    COPD Father    Pneumonia Father    Cardiomyopathy Brother    Hypertension Sister    Heart disease Brother    Hypertension Brother    Hypertension Brother    Hypertension Sister    Diabetes Sister    Osteoarthritis Sister      Review of Systems:   Please see the history of present illness.    All other systems reviewed and are negative.  EKGs/Labs/Other Test Reviewed:    EKG:  EKG today: Sinus rhythm with occasional PVCs; prior EKG: Sinus rhythm with incomplete right bundle branch block  Prior CV studies:   CMR 12/22 IMPRESSION: 1.  Severe mitral regurgitation (regurgitant fraction 48%)   2. Subendocardial late gadolinium enhancement in LV basal lateral wall, consistent with small infarct   3. RV insertion site LGE, which is a nonspecific finding often seen in setting of elevated pulmonary pressures   4. Normal LV size, mild hypertrophy, and normal systolic function (EF XX123456)   5.  Normal RV size and low normal systolic function (EF Q000111Q)  TEE 10/22  1. Left ventricular ejection fraction, by estimation, is 55 to 60%. The  left ventricle has normal function. The left ventricle has no regional  wall motion abnormalities.   2. Right ventricular systolic function is normal. The right ventricular  size is normal.   3. No left atrial/left atrial appendage thrombus was detected.   4. The mitral valve is abnormal. Moderate mitral valve regurgitation. No  evidence of mitral stenosis. There is mild holosystolic prolapse of the  middle scallop of the posterior leaflet of the mitral valve.   5. The aortic valve is tricuspid. Aortic valve regurgitation is trivial.  No aortic stenosis is present.   6. There is mild (Grade II) plaque involving the descending aorta.   Conclusion(s)/Recommendation(s): There is mild prolapse of  the P2 segment  of the posterior mitral valve leaflet. No flail seen. There is no  significant diastolic flow reversal in the pulmonary veins. PISA performed  but difficult given eccentricity of the   jet. Moderate eccentric, anteriorly directed MR. No vegetation.   TEE 10/22  1. Left ventricular ejection fraction, by estimation, is 55 to 60%. The  left ventricle has normal function. The left ventricle has no regional  wall motion abnormalities. There is mild left ventricular hypertrophy.  Left ventricular diastolic parameters  are consistent with Grade I diastolic dysfunction (impaired relaxation).   2. Right ventricular systolic function is normal. The right ventricular  size is mildly enlarged. Tricuspid regurgitation signal is inadequate for  assessing PA pressure.   3. There is an eccentric, anteriorly directed jet of mitral valve  regurgitation. There is posterior mitral leaflet prolapse. Given degree of  jet eccentricity, cannot exclude small flail segment though it is not well  visualized on this exam.. The mitral  valve is abnormal. Moderate  to severe mitral valve regurgitation. No  evidence of mitral stenosis.   4. The aortic valve is grossly normal. There is mild calcification of the  aortic valve. Aortic valve regurgitation is trivial. No aortic stenosis is  present.   5. Aortic dilatation noted. There is mild dilatation of the aortic root,  measuring 41 mm.   6. The inferior vena cava is normal in size with greater than 50%  respiratory variability, suggesting right atrial pressure of 3 mmHg.     Imaging studies that I have independently reviewed today: Cardiac MRI, TEE, TTE, and coronary CTA  Recent Labs: 06/23/2021: ALT 13 08/17/2021: BUN 24; Creatinine, Ser 0.86; Hemoglobin 13.6; Platelets 280; Potassium 3.9; Sodium 141   Recent Lipid Panel Lab Results  Component Value Date/Time   CHOL 197 09/04/2021 09:33 AM   TRIG 113 09/04/2021 09:33 AM   HDL 37 (L)  09/04/2021 09:33 AM   LDLCALC 139 (H) 09/04/2021 09:33 AM   LDLDIRECT 118.0 08/10/2015 08:15 AM    Risk Assessment/Calculations:           Physical Exam:    VS:  BP 118/70    Pulse 72    Ht 5\' 3"  (1.6 m)    Wt 188 lb 3.2 oz (85.4 kg)    SpO2 99%    BMI 33.34 kg/m    Wt Readings from Last 3 Encounters:  10/31/21 188 lb 3.2 oz (85.4 kg)  08/24/21 185 lb (83.9 kg)  08/16/21 186 lb 12.8 oz (84.7 kg)    GENERAL:  No apparent distress, AOx3 HEENT:  No carotid bruits, +2 carotid impulses, no scleral icterus CAR: RRR  no murmurs, gallops, rubs, or thrills RES:  Clear to auscultation bilaterally ABD:  Soft, nontender, nondistended, positive bowel sounds x 4 VASC:  +2 radial pulses, +2 carotid pulses, palpable pedal pulses NEURO:  CN 2-12 grossly intact; motor and sensory grossly intact PSYCH:  No active depression or anxiety EXT:  No edema, ecchymosis, or cyanosis  Signed, [name], MD  10/31/2021 1:18 PM    [address], [place], [place]  [address] Phone: [phone]; Fax: [phone]   Note:  This document was prepared using Dragon voice recognition software and may include unintentional dictation errors.

## 2021-11-01 LAB — BASIC METABOLIC PANEL
BUN/Creatinine Ratio: 24 (ref 10–24)
BUN: 21 mg/dL (ref 8–27)
CO2: 24 mmol/L (ref 20–29)
Calcium: 9.3 mg/dL (ref 8.6–10.2)
Chloride: 102 mmol/L (ref 96–106)
Creatinine, Ser: 0.88 mg/dL (ref 0.76–1.27)
Glucose: 112 mg/dL — ABNORMAL HIGH (ref 70–99)
Potassium: 3.6 mmol/L (ref 3.5–5.2)
Sodium: 143 mmol/L (ref 134–144)
eGFR: 93 mL/min/{1.73_m2} (ref >59)

## 2021-11-01 LAB — CBC
Hematocrit: 42.3 % (ref 37.5–51.0)
Hemoglobin: 14.1 g/dL (ref 13.0–17.7)
MCH: 30.9 pg (ref 26.6–33.0)
MCHC: 33.3 g/dL (ref 31.5–35.7)
MCV: 93 fL (ref 79–97)
Platelets: 251 10*3/uL (ref 150–450)
RBC: 4.57 x10E6/uL (ref 4.14–5.80)
RDW: 12.3 % (ref 11.6–15.4)
WBC: 8 10*3/uL (ref 3.4–10.8)

## 2021-11-13 ENCOUNTER — Telehealth: Payer: Self-pay | Admitting: Internal Medicine

## 2021-11-13 NOTE — Telephone Encounter (Signed)
Spoke w patient's wife.  He has been under a lot of stress with family issues (mother in law).  He woke at 3:00 am with hives that have resolved in about 40 min w 2 benadryl.  None presently.  She does not know of any drug allergies he may have.  Adv her to monitor and call if they recur and we can ask our pharmacy team to review medications.  She is in agreement with this plan.

## 2021-11-13 NOTE — Telephone Encounter (Signed)
Pts wife states that he broke out into hives this morning, from what she knows pt is not allergic to anything... took a benadryl which cleared this up but just wanted to advise [place]

## 2021-11-14 ENCOUNTER — Telehealth: Payer: Self-pay | Admitting: *Deleted

## 2021-11-14 NOTE — Telephone Encounter (Signed)
Cardiac catheterization scheduled at [facility] for: Wednesday November 15, 2021 10 AM [facility] Main Entrance [facility]) at: 8 AM   Diet-no solid food after midnight prior to cath, clear liquids until 5 AM day of procedure.  Medication instructions for procedure: -Hold:  Lasix-AM of procedure -Lisinopril-HCT-AM of procedure -Except hold medications usual morning medications can be taken pre-cath with sips of water including aspirin 81 mg.    Confirmed patient has responsible adult to drive home post procedure and be with patient first 24 hours after arriving home.  [facility] does allow one visitor to accompany you and wait in the hospital waiting room while you are there for your procedure. You and your visitor will be asked to wear a mask once you enter the hospital.   Patient reports does not currently have any new symptoms concerning for COVID-19 and no household members with COVID-19 like illness.    Reviewed procedure/mask/visitor instructions with patient.  See phone note 11/13/21-patient reports no hives since occurrence yesterday. Patient reports he has taken his usual medications without problems.

## 2021-11-15 ENCOUNTER — Encounter (HOSPITAL_COMMUNITY)
Admission: RE | Disposition: A | Discharge status: Home / Self Care | Payer: Self-pay | Source: Home / Self Care | Attending: Internal Medicine

## 2021-11-15 ENCOUNTER — Other Ambulatory Visit: Payer: Self-pay

## 2021-11-15 ENCOUNTER — Ambulatory Visit (HOSPITAL_COMMUNITY)
Admission: RE | Admit: 2021-11-15 | Discharge: 2021-11-15 | Disposition: A | Discharge status: Home / Self Care | Payer: Medicare Other | Attending: Internal Medicine | Admitting: Internal Medicine

## 2021-11-15 ENCOUNTER — Encounter (HOSPITAL_COMMUNITY): Payer: Self-pay | Admitting: Internal Medicine

## 2021-11-15 DIAGNOSIS — I10 Essential (primary) hypertension: Secondary | ICD-10-CM | POA: Diagnosis not present

## 2021-11-15 DIAGNOSIS — Z8249 Family history of ischemic heart disease and other diseases of the circulatory system: Secondary | ICD-10-CM | POA: Insufficient documentation

## 2021-11-15 DIAGNOSIS — J45909 Unspecified asthma, uncomplicated: Secondary | ICD-10-CM | POA: Insufficient documentation

## 2021-11-15 DIAGNOSIS — E785 Hyperlipidemia, unspecified: Secondary | ICD-10-CM | POA: Diagnosis not present

## 2021-11-15 DIAGNOSIS — I340 Nonrheumatic mitral (valve) insufficiency: Secondary | ICD-10-CM | POA: Diagnosis not present

## 2021-11-15 DIAGNOSIS — Z87891 Personal history of nicotine dependence: Secondary | ICD-10-CM | POA: Insufficient documentation

## 2021-11-15 DIAGNOSIS — I272 Pulmonary hypertension, unspecified: Secondary | ICD-10-CM | POA: Diagnosis not present

## 2021-11-15 HISTORY — PX: RIGHT/LEFT HEART CATH AND CORONARY ANGIOGRAPHY: CATH118266

## 2021-11-15 LAB — POCT I-STAT EG7
Acid-Base Excess: 1 mmol/L (ref 0.0–2.0)
Acid-Base Excess: 2 mmol/L (ref 0.0–2.0)
Bicarbonate: 27.4 mmol/L (ref 20.0–28.0)
Bicarbonate: 29.3 mmol/L — ABNORMAL HIGH (ref 20.0–28.0)
Calcium, Ion: 1.11 mmol/L — ABNORMAL LOW (ref 1.15–1.40)
Calcium, Ion: 1.31 mmol/L (ref 1.15–1.40)
HCT: 35 % — ABNORMAL LOW (ref 39.0–52.0)
HCT: 37 % — ABNORMAL LOW (ref 39.0–52.0)
Hemoglobin: 11.9 g/dL — ABNORMAL LOW (ref 13.0–17.0)
Hemoglobin: 12.6 g/dL — ABNORMAL LOW (ref 13.0–17.0)
O2 Saturation: 70 %
O2 Saturation: 72 %
Potassium: 3.5 mmol/L (ref 3.5–5.1)
Potassium: 3.9 mmol/L (ref 3.5–5.1)
Sodium: 142 mmol/L (ref 135–145)
Sodium: 146 mmol/L — ABNORMAL HIGH (ref 135–145)
TCO2: 29 mmol/L (ref 22–32)
TCO2: 31 mmol/L (ref 22–32)
pCO2, Ven: 53.1 mmHg (ref 44.0–60.0)
pCO2, Ven: 56.6 mmHg (ref 44.0–60.0)
pH, Ven: 7.321 (ref 7.250–7.430)
pH, Ven: 7.322 (ref 7.250–7.430)
pO2, Ven: 41 mmHg (ref 32.0–45.0)
pO2, Ven: 42 mmHg (ref 32.0–45.0)

## 2021-11-15 LAB — POCT I-STAT 7, (LYTES, BLD GAS, ICA,H+H)
Acid-Base Excess: 1 mmol/L (ref 0.0–2.0)
Bicarbonate: 27.6 mmol/L (ref 20.0–28.0)
Calcium, Ion: 1.23 mmol/L (ref 1.15–1.40)
HCT: 36 % — ABNORMAL LOW (ref 39.0–52.0)
Hemoglobin: 12.2 g/dL — ABNORMAL LOW (ref 13.0–17.0)
O2 Saturation: 94 %
Potassium: 3.8 mmol/L (ref 3.5–5.1)
Sodium: 143 mmol/L (ref 135–145)
TCO2: 29 mmol/L (ref 22–32)
pCO2 arterial: 49.5 mmHg — ABNORMAL HIGH (ref 32.0–48.0)
pH, Arterial: 7.354 (ref 7.350–7.450)
pO2, Arterial: 73 mmHg — ABNORMAL LOW (ref 83.0–108.0)

## 2021-11-15 SURGERY — RIGHT/LEFT HEART CATH AND CORONARY ANGIOGRAPHY
Anesthesia: LOCAL

## 2021-11-15 MED ORDER — HEPARIN SODIUM (PORCINE) 1000 UNIT/ML IJ SOLN
INTRAMUSCULAR | Status: AC
  Filled 2021-11-15: qty 10

## 2021-11-15 MED ORDER — SODIUM CHLORIDE 0.9 % IV SOLN: 250.0000 mL | INTRAVENOUS | Status: DC | PRN

## 2021-11-15 MED ORDER — HEPARIN SODIUM (PORCINE) 1000 UNIT/ML IJ SOLN
INTRAMUSCULAR | Status: DC | PRN
  Administered 2021-11-15: 5000 [IU] via INTRAVENOUS

## 2021-11-15 MED ORDER — VERAPAMIL HCL 2.5 MG/ML IV SOLN
INTRAVENOUS | Status: DC | PRN
  Administered 2021-11-15: 10 mL via INTRA_ARTERIAL

## 2021-11-15 MED ORDER — SODIUM CHLORIDE 0.9% FLUSH: 3.0000 mL | INTRAVENOUS | Status: DC | PRN

## 2021-11-15 MED ORDER — SODIUM CHLORIDE 0.9 % IV SOLN: INTRAVENOUS | Status: DC

## 2021-11-15 MED ORDER — MIDAZOLAM HCL 2 MG/2ML IJ SOLN
INTRAMUSCULAR | Status: AC
  Filled 2021-11-15: qty 2

## 2021-11-15 MED ORDER — LABETALOL HCL 5 MG/ML IV SOLN: 10.0000 mg | INTRAVENOUS | Status: DC | PRN

## 2021-11-15 MED ORDER — LIDOCAINE HCL (PF) 1 % IJ SOLN
INTRAMUSCULAR | Status: AC
  Filled 2021-11-15: qty 30

## 2021-11-15 MED ORDER — ONDANSETRON HCL 4 MG/2ML IJ SOLN: 4.0000 mg | Freq: Four times a day (QID) | INTRAMUSCULAR | Status: DC | PRN

## 2021-11-15 MED ORDER — SODIUM CHLORIDE 0.9% FLUSH: 3.0000 mL | Freq: Two times a day (BID) | INTRAVENOUS | Status: DC

## 2021-11-15 MED ORDER — HEPARIN (PORCINE) IN NACL 1000-0.9 UT/500ML-% IV SOLN
INTRAVENOUS | Status: AC
  Filled 2021-11-15: qty 1000

## 2021-11-15 MED ORDER — HYDRALAZINE HCL 20 MG/ML IJ SOLN: 10.0000 mg | INTRAMUSCULAR | Status: DC | PRN

## 2021-11-15 MED ORDER — MIDAZOLAM HCL 2 MG/2ML IJ SOLN
INTRAMUSCULAR | Status: DC | PRN
  Administered 2021-11-15: 1 mg via INTRAVENOUS

## 2021-11-15 MED ORDER — ACETAMINOPHEN 325 MG PO TABS: 650.0000 mg | ORAL_TABLET | ORAL | Status: DC | PRN

## 2021-11-15 MED ORDER — FENTANYL CITRATE (PF) 100 MCG/2ML IJ SOLN
INTRAMUSCULAR | Status: AC
  Filled 2021-11-15: qty 2

## 2021-11-15 MED ORDER — VERAPAMIL HCL 2.5 MG/ML IV SOLN
INTRAVENOUS | Status: AC
  Filled 2021-11-15: qty 2

## 2021-11-15 MED ORDER — FENTANYL CITRATE (PF) 100 MCG/2ML IJ SOLN
INTRAMUSCULAR | Status: DC | PRN
  Administered 2021-11-15: 25 ug via INTRAVENOUS

## 2021-11-15 MED ORDER — IOHEXOL 350 MG/ML SOLN
INTRAVENOUS | Status: DC | PRN
  Administered 2021-11-15: 65 mL

## 2021-11-15 MED ORDER — ASPIRIN 81 MG PO CHEW: 81.0000 mg | CHEWABLE_TABLET | ORAL | Status: DC

## 2021-11-15 MED ORDER — HEPARIN (PORCINE) IN NACL 2000-0.9 UNIT/L-% IV SOLN
INTRAVENOUS | Status: DC | PRN
  Administered 2021-11-15 (×2): 1000 mL

## 2021-11-15 MED ORDER — LIDOCAINE HCL (PF) 1 % IJ SOLN
INTRAMUSCULAR | Status: DC | PRN
  Administered 2021-11-15 (×2): 2 mL

## 2021-11-15 SURGICAL SUPPLY — 17 items
CATH BALLN WEDGE 5F 110CM (CATHETERS) ×1 IMPLANT
CATH DIAG 6FR JR4 (CATHETERS) ×1 IMPLANT
CATH DIAG 6FR PIGTAIL ANGLED (CATHETERS) ×1 IMPLANT
CATH INFINITI 6F FL3.5 (CATHETERS) ×1 IMPLANT
DEVICE RAD COMP TR BAND LRG (VASCULAR PRODUCTS) ×2 IMPLANT
GLIDESHEATH SLEND SS 6F .021 (SHEATH) ×1 IMPLANT
KIT HEART LEFT (KITS) ×3 IMPLANT
PACK CARDIAC CATHETERIZATION (CUSTOM PROCEDURE TRAY) ×3 IMPLANT
SHEATH GLIDE SLENDER 4/5FR (SHEATH) ×1 IMPLANT
SYR MEDRAD MARK 7 150ML (SYRINGE) ×3 IMPLANT
SYR MEDRAD MARK V 150ML (SYRINGE) ×1 IMPLANT
TRANSDUCER W/STOPCOCK (MISCELLANEOUS) ×3 IMPLANT
TUBING CIL FLEX 10 FLL-RA (TUBING) ×3 IMPLANT
TUBING CONTRAST HIGH PRESS 48 (TUBING) ×1 IMPLANT
WIRE EMERALD 3MM-J .035X260CM (WIRE) ×1 IMPLANT
WIRE MICRO SET SILHO 5FR 7 (SHEATH) ×1 IMPLANT
WIRE MICROINTRODUCER 60CM (WIRE) ×1 IMPLANT

## 2021-11-15 NOTE — Interval H&P Note (Signed)
History and Physical Interval Note:  11/15/2021 10:03 AM  Frank [name]  has presented today for surgery, with the diagnosis of mitral reguritation.  The various methods of treatment have been discussed with the patient and family. After consideration of risks, benefits and other options for treatment, the patient has consented to  Procedure(s): RIGHT/LEFT HEART CATH AND CORONARY ANGIOGRAPHY (N/A) as a surgical intervention.  The patient's history has been reviewed, patient examined, no change in status, stable for surgery.  I have reviewed the patient's chart and labs.  Questions were answered to the patient's satisfaction.    Cath Lab Visit (complete for each Cath Lab visit)  Clinical Evaluation Leading to the Procedure:   ACS: No.  Non-ACS:    Anginal Classification: No Symptoms  Anti-ischemic medical therapy: Minimal Therapy (1 class of medications)  Non-Invasive Test Results: No non-invasive testing performed  Prior CABG: No previous CABG        Orbie Pyo

## 2021-11-17 ENCOUNTER — Encounter: Payer: Self-pay | Admitting: Family Medicine

## 2021-11-17 ENCOUNTER — Ambulatory Visit (INDEPENDENT_AMBULATORY_CARE_PROVIDER_SITE_OTHER): Payer: Medicare Other | Admitting: Family Medicine

## 2021-11-17 ENCOUNTER — Other Ambulatory Visit: Payer: Self-pay

## 2021-11-17 VITALS — BP 138/82 | HR 72 | Temp 98.3°F | Wt 191.0 lb

## 2021-11-17 DIAGNOSIS — M170 Bilateral primary osteoarthritis of knee: Secondary | ICD-10-CM

## 2021-11-17 DIAGNOSIS — I10 Essential (primary) hypertension: Secondary | ICD-10-CM | POA: Diagnosis not present

## 2021-11-17 DIAGNOSIS — I340 Nonrheumatic mitral (valve) insufficiency: Secondary | ICD-10-CM | POA: Diagnosis not present

## 2021-11-17 MED ORDER — METHYLPREDNISOLONE ACETATE 80 MG/ML IJ SUSP
80.0000 mg | Freq: Once | INTRAMUSCULAR | Status: AC
  Administered 2021-11-17: 80 mg via INTRA_ARTICULAR

## 2021-11-17 NOTE — Assessment & Plan Note (Signed)
At goal on lisinopril-HCTZ 20-25 once daily.

## 2021-11-17 NOTE — Patient Instructions (Signed)
It was very nice to see you today!  We injected your knees today.  We can repeat in 3 months if needed.  No other changes today.   Take care, Dr Jimmey Ralph  PLEASE NOTE:  If you had any lab tests please let us know if you have not heard back within a few days. You may see your results on mychart before we have a chance to review them but we will give you a call once they are reviewed by [place]. If we ordered any referrals today, please let us know if you have not heard from their office within the next week.   Please try these tips to maintain a healthy lifestyle:  Eat at least 3 REAL meals and 1-2 snacks per day.  Aim for no more than 5 hours between eating.  If you eat breakfast, please do so within one hour of getting up.   Each meal should contain half fruits/vegetables, one quarter protein, and one quarter carbs (no bigger than a computer mouse)  Cut down on sweet beverages. This includes juice, soda, and sweet tea.   Drink at least 1 glass of water with each meal and aim for at least 8 glasses per day  Exercise at least 150 minutes every week.

## 2021-11-17 NOTE — Addendum Note (Signed)
Addended by: Dyann Kief on: 11/17/2021 11:32 AM   Modules accepted: Orders

## 2021-11-17 NOTE — Assessment & Plan Note (Signed)
Following with cardiology.  Symptoms are stable.

## 2021-11-17 NOTE — Progress Notes (Signed)
° °  Frank [name] is a 71 y.o. male who presents today for an office visit.  Assessment/Plan:  Chronic Problems Addressed Today: Nonrheumatic mitral valve regurgitation Following with cardiology.  Symptoms are stable.  Bilateral primary osteoarthritis of knee Pain is not controlled.  He is not interested in gel shots at this point.  Bilateral aspiration and injection performed today.  See below procedure note.  He tolerated well.  May consider referral to orthopedics if intra-articular injections to lose effectiveness.  Essential hypertension At goal on lisinopril-HCTZ 20-25 once daily.     Subjective:  HPI:  See A/P for status of chronic conditions.  Patient is here with knee pain.  Has longstanding history of bilateral knee arthritis.  We last perform injection about 3 months ago.  This did well.  He would like to have repeat injection in bilateral knees done today.  Since our last visit he has also been following with cardiology.  Underwent catheterization a couple of days ago.  He has been dealing with issues with mitral valve regurgitation.  Does have some dyspnea on exertion.  He will be following up with [facility]ology in about a week and a half.       Objective:  Physical Exam: BP 138/82 (BP Location: Left Arm, Patient Position: Sitting, Cuff Size: Normal)    Pulse 72    Temp 98.3 F (36.8 C)    Wt 191 lb (86.6 kg)    SpO2 97%    BMI 34.93 kg/m   Gen: No acute distress, resting comfortably CV: Regular rate and rhythm with no murmurs appreciated Pulm: Normal work of breathing, clear to auscultation bilaterally with no crackles, wheezes, or rhonchi MSK: Knees with gross effusion bilaterally with left worse than right. Neuro: Grossly normal, moves all extremities Psych: Normal affect and thought content  Knee Aspiration and Injection Procedure Note   Indication: Symptom relief of bilateral Knee Pain.   Procedure Details  Verbal consent was obtained for the procedure.  The left joint was prepped with Betadine. Topical ethyl chloride was applied for anesthesia.  Approximately 3 cc of 1% lidocaine without epinephrine was infiltrated through the superior lateral aspect of his knee.  An 18-gauge needle was then inserted into the joint space and 20 cc of serous fluid was aspirated.   3 ml 1% lidocaine and 1 ml of 80mg /cc Depo-Medrol was then injected into the joint. The needle was removed and the area cleansed and dressed.   Right joint was then prepped with Betadine.  Topical ethyl chloride was applied.  3 cc of 1% lidocaine without epinephrine was infiltrated through the superior lateral aspect of his knee.  An 18-gauge needle was then inserted in the joint space and 3 cc of serous fluid was aspirated.  3 mL of 1% lidocaine and 1 mL of 80 mg/cc of Depo-Medrol was then injected into the joint.  It was withdrawn.  Sterile bandage was placed.   Complications:  None; patient tolerated the procedure well.       . Katina Degree, MD 11/17/2021 11:22 AM

## 2021-11-17 NOTE — Assessment & Plan Note (Signed)
Pain is not controlled.  He is not interested in gel shots at this point.  Bilateral aspiration and injection performed today.  See below procedure note.  He tolerated well.  May consider referral to orthopedics if intra-articular injections to lose effectiveness.

## 2021-11-19 NOTE — Progress Notes (Signed)
Patient ID: Frank [name]                 DOB: June 27, 1951                    MRN: [id number]     HPI: Frank [name] is a 71 y.o. male patient referred to lipid clinic by Dr. Lynnette Caffey. PMH is significant for HTN, family hx of CAD, asthma and CAC of 75 (41st percentile), Minimal mixed atherosclerotic plaque in the proximal LAD, <25% stenosis, Minimal mixed atherosclerotic plaque in the proximal L circumflex, <25% stenosis. Mild mixed atherosclerotic plaque in the ostial second OM, 25-49% stenosis. Minimal scattered plaque in the dominant, distal L circumflex artery <25% stenosis.  At visit with PharmD on 09/04/21, pt endorsed being on atorvastatin in the past with joint aches. Has arthritis at baseline. Stated that his weakness is ice cream. He is pre-diabetic. Eats a decent amount of fast food. 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 Lipid panel revealed an LDL of 139 and rosuvastatin 20 mg daily was initiated. F/u labs scheduled for 11/27/21.   At last visit with Dr. Lynnette Caffey, pt stated he has experienced myalgias with rosuvastatin. Unclear how long he was taking. He was referred back to PharmD clinic for alternative options.  Most recently, pt underwent a right/left heart cath and coronary angiography on 11/15/2021 which confirmed moderate (3+) mitral regurgitation and referred for evaluation of mitral valve surgical repair.  Pt presents today accompanied by wife. He reports he took rosuvastatin 10 mg daily for 2 weeks before having muscle pain. He reports the pain is located specifically in both knees and worsens at night. He states that he also experienced diarrhea after taking rosuvastatin for about 2 weeks. Both side effects lingered for a few days but then dissipated when he stopped. He is not currently taking anything for cholesterol. He is interested in non-medication options to lower his LDL. His granddaughter had high cholesterol in her teens, and was able to lower it substantially through  diet/exercise. He has been working to change his diet, eating less fast food and choosing salads, and has noticed weight loss. He has been active through work, but not outside of this.  Current Medications: none Intolerances: atorvastatin, rosuvastatin (joint pain) Risk Factors: CAC 75, HTN, family hx LDL goal: <70  Diet: breakfast:scrambled eggs, toast, country ham, chicken biscuit, cereal  Lunch: banana or ham sandwich, apples, hamburger, fried chicken Dinner: grilled chicken, broccoli w/cheese, corn, beans, squash, potatoes, carrots, peas, cabbage- but drips in honey mustard.  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     albuterol (VENTOLIN HFA) 108 (90 Base) MCG/ACT inhaler TAKE 2 PUFFS BY MOUTH EVERY 6 HOURS AS NEEDED FOR WHEEZE OR SHORTNESS OF BREATH 6.7 each 1   ascorbic acid (VITAMIN C) 500 MG tablet Take 500 mg by mouth daily.     aspirin EC 81 MG tablet Take 1 tablet (81 mg total) by mouth daily. Swallow whole. 90 tablet 3   cholecalciferol (VITAMIN D) 25  MCG (1000 UNIT) tablet Take 1,000 Units by mouth daily.     diclofenac (VOLTAREN) 75 MG EC tablet TAKE 1 TABLET BY MOUTH TWICE A DAY (Patient taking differently: 75 mg daily.) 60 tablet 5   diclofenac sodium (VOLTAREN) 1 % GEL Apply topically to affected area qid (Patient taking differently: 2 g daily as needed (pain).) 100 g 1   fluticasone (FLONASE) 50 MCG/ACT nasal spray Place 1 spray into both nostrils daily as needed for allergies or rhinitis.     furosemide (LASIX) 20 MG tablet Take 1 tablet (20 mg total) by mouth daily. 90 tablet 3   lisinopril-hydrochlorothiazide (ZESTORETIC) 20-25 MG tablet Take 1 tablet by mouth daily. (Patient taking differently: Take 0.5 tablets by mouth daily.) 90 tablet 1   tamsulosin (FLOMAX) 0.4 MG CAPS capsule TAKE 1 CAPSULE BY MOUTH EVERY DAY (Patient taking differently: 0.4 mg daily as needed (slow urine flow).) 90 capsule 1   triamcinolone (KENALOG) 0.1 % paste Apply to affected area 1-2 times daily (Patient taking differently: 1 application daily as needed (mouth sores).) 5 g 12   vitamin B-12 (CYANOCOBALAMIN) 1000 MCG tablet Take 1,000 mcg by mouth daily.     Zinc 50 MG CAPS Take 50 mg by mouth daily.     Current Facility-Administered Medications on File Prior to Visit  Medication Dose Route Frequency Provider Last Rate Last Admin   sodium chloride flush (NS) 0.9 % injection 3 mL  3 mL Intravenous Q12H Orbie Pyo, MD        Allergies  Allergen Reactions   Atorvastatin Other (See Comments)    Joint pain    Assessment/Plan:  1. Hyperlipidemia -  LDL is elevated above goal of <70 due to fam hx and CAC score of 75. Discussed benefits of other cholesterol-lowering medications such as PCSK9 inhibitors and Zetia. Patient wanted to discuss with his daughter who is a Engineer, civil (consulting). Provided patient with information on his potential therapeutic options, and plan to call patient after 1/30 to see if he has decided about medication treatment. At this time, patient  will continue to work on cutting down fast food and eating a heart healthy diet. Encouraged patient to try to complete sustained exercise like walking.   Clarice Pole, PharmD Student  Thank you,  Olene Floss, Pharm.D, BCPS, CPP [facility]  [address] [place], [place] [address]  Phone: [phone]; Fax: [phone]

## 2021-11-19 NOTE — Progress Notes (Signed)
° °  Subjective:    Patient ID: Frank [name], male    DOB: 11/25/1950, 71 y.o.   MRN: [id number]  HPI    Review of Systems     Objective:   Physical Exam        Assessment & Plan:

## 2021-11-20 ENCOUNTER — Other Ambulatory Visit: Payer: Self-pay

## 2021-11-20 ENCOUNTER — Ambulatory Visit: Payer: Medicare Other | Admitting: Pharmacist

## 2021-11-20 DIAGNOSIS — E785 Hyperlipidemia, unspecified: Secondary | ICD-10-CM

## 2021-11-20 NOTE — Patient Instructions (Addendum)
It was great to see you today!  Your LDL goal is <70 mg/dL.  The options we talked through today: 1) Try a different statin or lower dose of rosuvastatin as you are able to tolerate it 2) Start a PCSK9 Inhibitor (Repatha or Praluent) -  twice monthly injection (can lower LDL levels by over 60%) 3) Start a Zetia (ezetimibe) - once daily tablets (can lower LDL levels by about 20%)  Continue to focus on a heart healthy diet and exercise.  We will give you a call after January 30th, 2023 to talk about next steps.  If you need to give [place] a call, our number is

## 2021-11-21 ENCOUNTER — Telehealth: Payer: Self-pay | Admitting: Internal Medicine

## 2021-11-21 NOTE — Telephone Encounter (Signed)
Arm is hurting at night.  Tight squeezing feeling wakes him.  Not bad during the day when he is moving it around/using arm more. Swelling present from insertion site to almost halfway up forearm.  Bruising present on forearm to elbow. Purple/reddish color.  Insertion site is dry and intact.  Able to make make a strong fist.  No tingling or numbness.  Hand/arm is normal skin temp.  Used ice pack which helped a little.    Pt will continue to monitor and report any new symptoms as discussed above.  He and his wife thanked me for the information.  Dr. Lynnette Caffey has been made aware.

## 2021-11-21 NOTE — Telephone Encounter (Signed)
Patient's wife states for the past 3 days post heart cath his right arm has been in pain and bruised. Patient got on the phone and states his arm is bruised from his wrist to his elbow and the pain is waking him up in his sleep.

## 2021-11-22 ENCOUNTER — Other Ambulatory Visit: Payer: Self-pay | Admitting: Family Medicine

## 2021-11-27 ENCOUNTER — Other Ambulatory Visit: Payer: Medicare Other

## 2021-11-27 DIAGNOSIS — I340 Nonrheumatic mitral (valve) insufficiency: Secondary | ICD-10-CM | POA: Diagnosis not present

## 2021-11-28 ENCOUNTER — Other Ambulatory Visit: Payer: Self-pay

## 2021-11-28 ENCOUNTER — Other Ambulatory Visit: Payer: Medicare Other | Admitting: *Deleted

## 2021-11-28 DIAGNOSIS — E785 Hyperlipidemia, unspecified: Secondary | ICD-10-CM

## 2021-11-28 LAB — LIPID PANEL
Chol/HDL Ratio: 5.6 ratio — ABNORMAL HIGH (ref 0.0–5.0)
Cholesterol, Total: 192 mg/dL (ref 100–199)
HDL: 34 mg/dL — ABNORMAL LOW (ref >39)
LDL Chol Calc (NIH): 135 mg/dL — ABNORMAL HIGH (ref 0–99)
Triglycerides: 128 mg/dL (ref 0–149)
VLDL Cholesterol Cal: 23 mg/dL (ref 5–40)

## 2021-12-01 ENCOUNTER — Telehealth: Payer: Self-pay | Admitting: Pharmacist

## 2021-12-01 MED ORDER — ROSUVASTATIN CALCIUM 5 MG PO TABS: 5.0000 mg | ORAL_TABLET | Freq: Every day | ORAL | 3 refills | Status: DC

## 2021-12-01 NOTE — Telephone Encounter (Signed)
Patient called back. He is not interested in a shot, but is willing to try rosuvastatin 5mg  daily. Recheck lipids in 2 months.

## 2021-12-01 NOTE — Telephone Encounter (Signed)
Left message on pt machine to discuss labs and see if he decided on medication treatment based on last visit with pharmD.

## 2021-12-06 ENCOUNTER — Encounter: Payer: Self-pay | Admitting: Pharmacist

## 2021-12-26 ENCOUNTER — Telehealth: Payer: Self-pay | Admitting: Family Medicine

## 2021-12-26 NOTE — Telephone Encounter (Signed)
Spoke with patient's spouse she stated patient was having surgery next week and to call back after his recovery

## 2022-01-01 DIAGNOSIS — I700 Atherosclerosis of aorta: Secondary | ICD-10-CM | POA: Diagnosis not present

## 2022-01-01 DIAGNOSIS — Z87891 Personal history of nicotine dependence: Secondary | ICD-10-CM | POA: Diagnosis not present

## 2022-01-01 DIAGNOSIS — Z888 Allergy status to other drugs, medicaments and biological substances status: Secondary | ICD-10-CM | POA: Diagnosis not present

## 2022-01-01 DIAGNOSIS — J811 Chronic pulmonary edema: Secondary | ICD-10-CM | POA: Diagnosis not present

## 2022-01-01 DIAGNOSIS — I471 Supraventricular tachycardia: Secondary | ICD-10-CM | POA: Diagnosis not present

## 2022-01-01 DIAGNOSIS — I319 Disease of pericardium, unspecified: Secondary | ICD-10-CM | POA: Diagnosis not present

## 2022-01-01 DIAGNOSIS — J982 Interstitial emphysema: Secondary | ICD-10-CM | POA: Diagnosis not present

## 2022-01-01 DIAGNOSIS — J952 Acute pulmonary insufficiency following nonthoracic surgery: Secondary | ICD-10-CM | POA: Diagnosis not present

## 2022-01-01 DIAGNOSIS — I97190 Other postprocedural cardiac functional disturbances following cardiac surgery: Secondary | ICD-10-CM | POA: Diagnosis not present

## 2022-01-01 DIAGNOSIS — Z9889 Other specified postprocedural states: Secondary | ICD-10-CM | POA: Diagnosis not present

## 2022-01-01 DIAGNOSIS — I340 Nonrheumatic mitral (valve) insufficiency: Secondary | ICD-10-CM | POA: Diagnosis not present

## 2022-01-01 DIAGNOSIS — Z7982 Long term (current) use of aspirin: Secondary | ICD-10-CM | POA: Diagnosis not present

## 2022-01-01 DIAGNOSIS — Z8709 Personal history of other diseases of the respiratory system: Secondary | ICD-10-CM | POA: Diagnosis not present

## 2022-01-01 DIAGNOSIS — D62 Acute posthemorrhagic anemia: Secondary | ICD-10-CM | POA: Diagnosis not present

## 2022-01-01 DIAGNOSIS — I5032 Chronic diastolic (congestive) heart failure: Secondary | ICD-10-CM | POA: Diagnosis not present

## 2022-01-01 DIAGNOSIS — I10 Essential (primary) hypertension: Secondary | ICD-10-CM | POA: Diagnosis not present

## 2022-01-01 DIAGNOSIS — R5381 Other malaise: Secondary | ICD-10-CM | POA: Diagnosis not present

## 2022-01-01 DIAGNOSIS — J90 Pleural effusion, not elsewhere classified: Secondary | ICD-10-CM | POA: Diagnosis not present

## 2022-01-01 DIAGNOSIS — J9811 Atelectasis: Secondary | ICD-10-CM | POA: Diagnosis not present

## 2022-01-01 DIAGNOSIS — R739 Hyperglycemia, unspecified: Secondary | ICD-10-CM | POA: Diagnosis not present

## 2022-01-01 DIAGNOSIS — G8918 Other acute postprocedural pain: Secondary | ICD-10-CM | POA: Diagnosis not present

## 2022-01-01 DIAGNOSIS — J45909 Unspecified asthma, uncomplicated: Secondary | ICD-10-CM | POA: Diagnosis not present

## 2022-01-01 DIAGNOSIS — Z20822 Contact with and (suspected) exposure to covid-19: Secondary | ICD-10-CM | POA: Diagnosis not present

## 2022-01-01 DIAGNOSIS — Z4682 Encounter for fitting and adjustment of non-vascular catheter: Secondary | ICD-10-CM | POA: Diagnosis not present

## 2022-01-01 DIAGNOSIS — R918 Other nonspecific abnormal finding of lung field: Secondary | ICD-10-CM | POA: Diagnosis not present

## 2022-01-01 DIAGNOSIS — R579 Shock, unspecified: Secondary | ICD-10-CM | POA: Diagnosis not present

## 2022-01-01 DIAGNOSIS — T797XXA Traumatic subcutaneous emphysema, initial encounter: Secondary | ICD-10-CM | POA: Diagnosis not present

## 2022-01-01 DIAGNOSIS — E785 Hyperlipidemia, unspecified: Secondary | ICD-10-CM | POA: Diagnosis not present

## 2022-01-01 DIAGNOSIS — M47814 Spondylosis without myelopathy or radiculopathy, thoracic region: Secondary | ICD-10-CM | POA: Diagnosis not present

## 2022-01-01 DIAGNOSIS — I110 Hypertensive heart disease with heart failure: Secondary | ICD-10-CM | POA: Diagnosis not present

## 2022-01-01 DIAGNOSIS — Z79899 Other long term (current) drug therapy: Secondary | ICD-10-CM | POA: Diagnosis not present

## 2022-01-02 HISTORY — PX: MITRAL VALVE REPAIR: SHX2039

## 2022-01-07 ENCOUNTER — Other Ambulatory Visit: Payer: Self-pay | Admitting: Family Medicine

## 2022-01-16 ENCOUNTER — Other Ambulatory Visit: Payer: Self-pay | Admitting: Family Medicine

## 2022-01-22 DIAGNOSIS — T797XXA Traumatic subcutaneous emphysema, initial encounter: Secondary | ICD-10-CM | POA: Diagnosis not present

## 2022-01-22 DIAGNOSIS — Z9889 Other specified postprocedural states: Secondary | ICD-10-CM | POA: Diagnosis not present

## 2022-01-22 DIAGNOSIS — R600 Localized edema: Secondary | ICD-10-CM | POA: Diagnosis not present

## 2022-01-22 DIAGNOSIS — M170 Bilateral primary osteoarthritis of knee: Secondary | ICD-10-CM | POA: Diagnosis not present

## 2022-01-22 DIAGNOSIS — Z48812 Encounter for surgical aftercare following surgery on the circulatory system: Secondary | ICD-10-CM | POA: Diagnosis not present

## 2022-01-23 ENCOUNTER — Telehealth (HOSPITAL_COMMUNITY): Payer: Self-pay | Admitting: *Deleted

## 2022-01-23 NOTE — Telephone Encounter (Signed)
Received notification from Dr. [name] at [facility] that this pt is eligible to participate in Cardiac rehab 01/02/2022 [facility].  Called and spoke to both pt and his wife Butch Penny. Pt is interested in participating after he sees Dr. Ali Lowe on 4/24.  Pt completed his post surgical follow up on 01/22/22.  Pt will need electronic referral for cardiac rehab and 12 lead ekg in order to schedule for CR. Cherre Huger, BSN ?Cardiac and Pulmonary Rehab Nurse Navigator  ? ?

## 2022-02-16 ENCOUNTER — Encounter: Payer: Self-pay | Admitting: Family Medicine

## 2022-02-16 ENCOUNTER — Ambulatory Visit (INDEPENDENT_AMBULATORY_CARE_PROVIDER_SITE_OTHER): Payer: Medicare Other | Admitting: Family Medicine

## 2022-02-16 VITALS — BP 126/81 | HR 77 | Temp 98.5°F | Ht 62.0 in | Wt 185.8 lb

## 2022-02-16 DIAGNOSIS — I10 Essential (primary) hypertension: Secondary | ICD-10-CM

## 2022-02-16 DIAGNOSIS — I340 Nonrheumatic mitral (valve) insufficiency: Secondary | ICD-10-CM

## 2022-02-16 DIAGNOSIS — M170 Bilateral primary osteoarthritis of knee: Secondary | ICD-10-CM | POA: Diagnosis not present

## 2022-02-16 MED ORDER — METHYLPREDNISOLONE ACETATE 80 MG/ML IJ SUSP
80.0000 mg | Freq: Once | INTRAMUSCULAR | Status: AC
  Administered 2022-02-16: 80 mg via INTRA_ARTICULAR

## 2022-02-16 NOTE — Assessment & Plan Note (Signed)
Pain still not controlled.  Bilateral aspiration injection performed today.  See below procedure note.  He Toller well.  He is interested in possible gel shots.  He will follow-up with sports medicine to discuss this. ?

## 2022-02-16 NOTE — Assessment & Plan Note (Signed)
Echo lisinopril-HCTZ 20-25 once daily. ?

## 2022-02-16 NOTE — Patient Instructions (Addendum)
It was very nice to see you today! ? ?We injected your knee today.  ? ?You can call Dr. Nicolasa Ducking office to see if they will do the gel injections. ? ?We can repeat your injection in 3 months if needed. ? ?Take care, ?Dr Jerline Pain ? ?PLEASE NOTE: ? ?If you had any lab tests please let us know if you have not heard back within a few days. You may see your results on mychart before we have a chance to review them but we will give you a call once they are reviewed by [place]. If we ordered any referrals today, please let us know if you have not heard from their office within the next week.  ? ?Please try these tips to maintain a healthy lifestyle: ? ?Eat at least 3 REAL meals and 1-2 snacks per day.  Aim for no more than 5 hours between eating.  If you eat breakfast, please do so within one hour of getting up.  ? ?Each meal should contain half fruits/vegetables, one quarter protein, and one quarter carbs (no bigger than a computer mouse) ? ?Cut down on sweet beverages. This includes juice, soda, and sweet tea.  ? ?Drink at least 1 glass of water with each meal and aim for at least 8 glasses per day ? ?Exercise at least 150 minutes every week.   ?

## 2022-02-16 NOTE — Assessment & Plan Note (Signed)
Continue management per cardiology.  He is doing well postop. ?

## 2022-02-16 NOTE — Progress Notes (Signed)
? ?  Frank [name] is a 71 y.o. male who presents today for an office visit. ? ?Assessment/Plan:  ?Chronic Problems Addressed Today: ?Essential hypertension ?Echo lisinopril-HCTZ 20-25 once daily. ? ?Nonrheumatic mitral valve regurgitation s/p MVR 2023 ?Continue management per cardiology.  He is doing well postop. ? ?Bilateral primary osteoarthritis of knee ?Pain still not controlled.  Bilateral aspiration injection performed today.  See below procedure note.  He Toller well.  He is interested in possible gel shots.  He will follow-up with sports medicine to discuss this. ? ? ?  ?Subjective:  ?HPI: ? ?Patient here for follow up. Please see a/p for status of chronic conditions.  ? ?Since our last visit he underwent mitral valve replacement at [facility]. HE had some post operative pain but seems to be doing well from this.   ? ?He would also like to have knee injections done today. This was last done about 3 months ago. He did well with this.  ? ?   ?  ?Objective:  ?Physical Exam: ?BP 126/81   Pulse 77   Temp 98.5 ?F (36.9 ?C) (Temporal)   Ht 5\' 2"  (1.575 m)   Wt 185 lb 12.8 oz (84.3 kg)   SpO2 100%   BMI 33.98 kg/m?   ?Gen: No acute distress, resting comfortably ?CV: Regular rate and rhythm with no murmurs appreciated ?Pulm: Normal work of breathing, clear to auscultation bilaterally with no crackles, wheezes, or rhonchi ?MSK: Bilateral knee with gross effusion left greater than right. ?Neuro: Grossly normal, moves all extremities ?Psych: Normal affect and thought content ? ? ?Knee Aspiration and Injection Procedure Note ?  ?Indication: Symptom relief of bilateral Knee Pain. ?  ?Procedure Details  ?Verbal consent was obtained for the procedure. The left joint was prepped with Betadine. Topical ethyl chloride was applied for anesthesia.  Approximately 3 cc of 1% lidocaine without epinephrine was infiltrated through the superior lateral aspect of his knee.  An 18-gauge needle was then inserted into the joint space  and 45 cc of serous fluid was aspirated.   3 ml 1% lidocaine and 1 ml of 80mg /cc Depo-Medrol was then injected into the joint. The needle was removed and the area cleansed and dressed. ?  ?Right joint was then prepped with Betadine.  Topical ethyl chloride was applied.  3 cc of 1% lidocaine without epinephrine was infiltrated through the superior lateral aspect of his knee.  An 18-gauge needle was then inserted in the joint space and 1 cc of serous fluid was aspirated.  3 mL of 1% lidocaine and 1 mL of 80 mg/cc of Depo-Medrol was then injected into the joint.  It was withdrawn.  Sterile bandage was placed. ?  ?Complications:  None; patient tolerated the procedure well. ? ? ?   ? ? . , MD ?02/16/2022 1:50 PM  ?

## 2022-02-17 NOTE — Progress Notes (Signed)
?Cardiology Office Note:   ? ?Date:  02/19/2022  ? ?ID:  Frank [name], DOB 12-May-1951, MRN [id number] ? ?PCP:  Ardith Dark, MD  ? ?[facility] Providers ?Cardiologist:  Alverda Skeans, MD ?Referring MD: Ardith Dark, MD  ? ?Chief Complaint/Reason for Referral: Cardiology follow-up mitral regurgitation ? ?ASSESSMENT:   ? ?1. Nonrheumatic mitral valve regurgitation   ?2. Dyslipidemia   ?3. Essential hypertension   ?4. Dyspnea, unspecified type   ? ? ?PLAN:   ? ?In order of problems listed above: ?1.  Mitral regurgitation: Status post repair we will obtain echocardiogram to assess his repair and refer to cardiac rehabilitation.  Follow-up in 9 months or earlier if needed.  He has a bit of peripheral edema so I have asked him to take his Lasix twice a day for 3 days and then go back down to once a day.  He is appropriate for cardiac rehabilitation.  I will discontinue aspirin the next time that I see him. ?2.  Dyslipidemia: The patient is trying Crestor and this is being managed by pharmacy division. ?3.  Hypertension: Blood pressure is well controlled on his current regimen. ? ?Dispo:  Return in about 9 months (around 11/21/2022).  ? ?Medication Adjustments/Labs and Tests Ordered: ?Current medicines are reviewed at length with the patient today.  Concerns regarding medicines are outlined above. ? ?The following changes have been made:  no change  ? ?Labs/tests ordered: ?Orders Placed This Encounter  ?Procedures  ? AMB referral to cardiac rehabilitation  ? EKG 12-Lead  ? ECHOCARDIOGRAM COMPLETE  ? ? ?Medication Changes: ?Meds ordered this encounter  ?Medications  ? furosemide (LASIX) 20 MG tablet  ?  Sig: Take 1 tablet by mouth twice daily for 3 days, then continue to take 1 tablet by mouth daily  ?  Dispense:  93 tablet  ?  Refill:  3  ? metoprolol tartrate (LOPRESSOR) 25 MG tablet  ?  Sig: Take 0.5 tablets (12.5 mg total) by mouth 2 (two) times daily.  ?  Dispense:  90 tablet  ?  Refill:  3  ? ? ?Current  medicines are reviewed at length with the patient today.  The patient does not have concerns regarding medicines. ? ? ?History of Present Illness:   ? ?FOCUSED PROBLEM LIST:   ?1.  Hypertension ?2.  Family history of early coronary artery disease ?3.  Prior remote smoking (~0.5 ppd x 2 years)  ?4.  Asthma ?5.  Severe mitral regurgitation status post mitral valve repair with 36 mm Simulus ring, Gore-Tex artificial cord to P2, and Alfieri stitch March 2023 at [facility]; no coronary artery disease on preoperative cardiac catheterization ?6.  Hyperlipidemia intolerant of atorvastatin ? ?The patient is a 71 y.o. male with the indicated medical history here for for routine cardiology follow-up.  He was referred to an external institution for consideration for mitral valve repair.  This was conducted in March and his postoperative course was complicated by supraventricular tachycardia that was short-lived.  He was discharged on a beta-blocker. ? ?The patient is doing well.  He has some minor aches and pains around his incision sites.  His incisions are well healed.  He denies any significant shortness of breath, palpitations, paroxysmal nocturnal dyspnea, orthopnea.  He has some mild peripheral edema which seems to get better with recumbency.  His weight has increased by about 10 pounds since discharge.  He has required no hospitalizations or emergency room visits.  He has not yet  been participating in cardiac rehabilitation.  He is otherwise well and without significant complaints. ? ?Current Medications: ?Current Meds  ?Medication Sig  ? acetaminophen (TYLENOL) 500 MG tablet Take 2 tablets by mouth daily.  ? albuterol (VENTOLIN HFA) 108 (90 Base) MCG/ACT inhaler INHALE 2 PUFFS BY MOUTH EVERY 6 HOURS AS NEEDED FOR WHEEZE OR SHORTNESS OF BREATH  ? ascorbic acid (VITAMIN C) 500 MG tablet Take 500 mg by mouth daily.  ? aspirin EC 81 MG tablet Take 1 tablet (81 mg total) by mouth daily. Swallow whole.  ? cholecalciferol  (VITAMIN D) 25 MCG (1000 UNIT) tablet Take 1,000 Units by mouth daily.  ? diclofenac (VOLTAREN) 75 MG EC tablet TAKE 1 TABLET BY MOUTH TWICE A DAY  ? diclofenac sodium (VOLTAREN) 1 % GEL Apply topically to affected area qid (Patient taking differently: 2 g daily as needed (pain).)  ? fluticasone (FLONASE) 50 MCG/ACT nasal spray Place 1 spray into both nostrils daily as needed for allergies or rhinitis.  ? lisinopril-hydrochlorothiazide (ZESTORETIC) 20-25 MG tablet Take 1 tablet by mouth daily. (Patient taking differently: Take 0.5 tablets by mouth daily.)  ? POTASSIUM CHLORIDE PO Take 20 mg by mouth daily. Pt takes 1/2 tablet 10 mg once a day.  ? rosuvastatin (CRESTOR) 5 MG tablet Take 1 tablet (5 mg total) by mouth daily.  ? tamsulosin (FLOMAX) 0.4 MG CAPS capsule TAKE 1 CAPSULE BY MOUTH EVERY DAY (Patient taking differently: 0.4 mg daily as needed (slow urine flow).)  ? triamcinolone (KENALOG) 0.1 % paste Apply to affected area 1-2 times daily (Patient taking differently: 1 application. daily as needed (mouth sores).)  ? vitamin B-12 (CYANOCOBALAMIN) 1000 MCG tablet Take 1,000 mcg by mouth daily.  ? Zinc 50 MG CAPS Take 50 mg by mouth daily.  ? [DISCONTINUED] furosemide (LASIX) 20 MG tablet Take 1 tablet (20 mg total) by mouth daily.  ? [DISCONTINUED] metoprolol tartrate (LOPRESSOR) 25 MG tablet SMARTSIG:0.5 Tablet(s) By Mouth Every 12 Hours  ?  ? ?Allergies:    ?Atorvastatin  ? ?Social History:   ?Social History  ? ?Tobacco Use  ? Smoking status: Former  ? Smokeless tobacco: Never  ? Tobacco comments:  ?  quit in 1972  ?Substance Use Topics  ? Alcohol use: No  ? Drug use: No  ?  ? ?Family Hx: ?Family History  ?Problem Relation Age of Onset  ? Kidney disease Mother   ? COPD Father   ? Pneumonia Father   ? Cardiomyopathy Brother   ? Hypertension Sister   ? Heart disease Brother   ? Hypertension Brother   ? Hypertension Brother   ? Hypertension Sister   ? Diabetes Sister   ? Osteoarthritis Sister   ?  ? ?Review of  Systems:   ?Please see the history of present illness.    ?All other systems reviewed and are negative. ?  ? ? ?EKGs/Labs/Other Test Reviewed:   ? ?EKG:  EKG performed January 2023 that I personally reviewed demonstrates sinus rhythm with PVCs; EKG performed today that I personally reviewed demonstrates sinus rhythm with incomplete right bundle branch block. ? ?Prior CV studies: ?CMR 12/22 ?IMPRESSION: ?1.  Severe mitral regurgitation (regurgitant fraction 48%) ?2. 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 RV insertion site LGE, which is a nonspecific finding often seen in setting of elevated pulmonary pressures ?4. Normal LV size, mild hypertrophy, and normal systolic function (EF 53%) ?5.  Normal RV size and low normal systolic  function (EF 48%) ? ?Other studies Reviewed: ?Review of the additional studies/records demonstrates: [facility]. ? ?Recent Labs: ?06/23/2021: ALT 13 ?10/31/2021: BUN 21; Creatinine, Ser 0.88; Platelets 251 ?11/15/2021: Hemoglobin 11.9; Potassium 3.5; Sodium 146  ? ?Recent Lipid Panel ?Lab Results  ?Component Value Date/Time  ? CHOL 192 11/28/2021 09:37 AM  ? TRIG 128 11/28/2021 09:37 AM  ? HDL 34 (L) 11/28/2021 09:37 AM  ? LDLCALC 135 (H) 11/28/2021 09:37 AM  ? LDLDIRECT 118.0 08/10/2015 08:15 AM  ? ? ?Risk Assessment/Calculations:   ? ? ?    ? ?Physical Exam:   ? ?VS:  BP 110/70   Pulse 82   Ht 5\' 2"  (1.575 m)   Wt 188 lb 9.6 oz (85.5 kg)   SpO2 97%   BMI 34.50 kg/m?    ?Wt Readings from Last 3 Encounters:  ?02/19/22 188 lb 9.6 oz (85.5 kg)  ?02/16/22 185 lb 12.8 oz (84.3 kg)  ?11/17/21 191 lb (86.6 kg)  ?  ?GENERAL:  No apparent distress, AOx3 ?HEENT:  No carotid bruits, +2 carotid impulses, no scleral icterus ?CAR: RRR no murmurs, gallops, rubs, or thrills; well-healed surgical incisions ?RES:  Clear to auscultation bilaterally ?ABD:  Soft, nontender, nondistended,  positive bowel sounds x 4 ?VASC:  +2 radial pulses, +2 carotid pulses, palpable pedal pulses ?NEURO:  CN 2-12 grossly intact; motor and sensory grossly intact ?PSYCH:  No active depression or anxiety ?EXT:  No ed

## 2022-02-19 ENCOUNTER — Encounter: Payer: Self-pay | Admitting: Internal Medicine

## 2022-02-19 ENCOUNTER — Ambulatory Visit: Payer: Medicare Other | Admitting: Internal Medicine

## 2022-02-19 VITALS — BP 110/70 | HR 82 | Ht 62.0 in | Wt 188.6 lb

## 2022-02-19 DIAGNOSIS — R0600 Dyspnea, unspecified: Secondary | ICD-10-CM

## 2022-02-19 DIAGNOSIS — I10 Essential (primary) hypertension: Secondary | ICD-10-CM | POA: Diagnosis not present

## 2022-02-19 DIAGNOSIS — E785 Hyperlipidemia, unspecified: Secondary | ICD-10-CM

## 2022-02-19 DIAGNOSIS — I340 Nonrheumatic mitral (valve) insufficiency: Secondary | ICD-10-CM | POA: Diagnosis not present

## 2022-02-19 MED ORDER — METOPROLOL TARTRATE 25 MG PO TABS: 12.5000 mg | ORAL_TABLET | Freq: Two times a day (BID) | ORAL | 3 refills | Status: DC

## 2022-02-19 MED ORDER — FUROSEMIDE 20 MG PO TABS: ORAL_TABLET | ORAL | 3 refills | Status: DC

## 2022-02-19 NOTE — Patient Instructions (Addendum)
Medication Instructions:  ?Your physician has recommended you make the following change in your medication:  ?1- TAKE Furosemide (Lasix) 20 mg by mouth twice daily for 3 days, then go back to taking once daily. ? ?*If you need a refill on your cardiac medications before your next appointment, please call your pharmacy* ? ?Lab Work: ?If you have labs (blood work) drawn today and your tests are completely normal, you will receive your results only by: ?MyChart Message (if you have MyChart) OR ?A paper copy in the mail ?If you have any lab test that is abnormal or we need to change your treatment, we will call you to review the results. ? ?Testing/Procedures: ?Your physician has requested that you have an echocardiogram. Echocardiography is a painless test that uses sound waves to create images of your heart. It provides your doctor with information about the size and shape of your heart and how well your heart?s chambers and valves are working. This procedure takes approximately one hour. There are no restrictions for this procedure. ? ?Follow-Up: ?At [facility], you and your health needs are our priority.  As part of our continuing mission to provide you with exceptional heart care, we have created designated Provider Care Teams.  These Care Teams include your primary Cardiologist (physician) and Advanced Practice Providers (APPs -  Physician Assistants and Nurse Practitioners) who all work together to provide you with the care you need, when you need it. ? ?We recommend signing up for the patient portal called "MyChart".  Sign up information is provided on this After Visit Summary.  MyChart is used to connect with patients for Virtual Visits (Telemedicine).  Patients are able to view lab/test results, encounter notes, upcoming appointments, etc.  Non-urgent messages can be sent to your provider as well.   ?To learn more about what you can do with MyChart, go to [email or web address].   ? ?Your next  appointment:   ?9 month(s) ? ?The format for your next appointment:   ?In Person ? ?Provider:   ?[name], MD { ? ?Other Instructions ?You have been referred to cardiac rehab, they will call you to make an appointment. ? ?Important Information About Sugar ? ? ? ? ?  ?

## 2022-02-26 ENCOUNTER — Telehealth: Payer: Self-pay | Admitting: Internal Medicine

## 2022-02-26 NOTE — Telephone Encounter (Signed)
?  Pt c/o medication issue: ? ?1. Name of Medication:  ? metoprolol tartrate (LOPRESSOR) 25 MG tablet  ? ? ?2. How are you currently taking this medication (dosage and times per day)? Take 0.5 tablets (12.5 mg total) by mouth 2 (two) times daily. ? ?3. Are you having a reaction (difficulty breathing--STAT)?  ? ?4. What is your medication issue? Pt's spouse said, pt has ?Rash on his right leg, dizzy tired and a little bit whizzing dry mouth ankle is swelling and joint pains after started taking this meds ?

## 2022-02-26 NOTE — Telephone Encounter (Signed)
Pt has numerous concerns that he is relating to all happening ever since he started the metoprolol including wheezing, dry mouth, fatigue, dizziness, joint pain, leg swelling and a rash that just started Friday on one leg.  Not itchy, did not try any creams for this but it has started going away.  He can sit down and fall right asleep any time of day.  The leg swelling didn't really improve w increasing lasix for 3 days and he uses the inhaler when he notices wheezing and it helps. I adv those are many symptoms and it is unlikely that metoprolol is causing all of those.   ? ?His BP today is good and tends to run in the 120s on average.  123/75 today.  He was on a different medication before changed to this and tolerated fine but does not remember the name of it. ? ?I adv I would forward to Dr. Lynnette Caffey for review and recommendations.  In the meantime I told him to stop taking the medication until he hears back from our office. ?

## 2022-02-27 NOTE — Telephone Encounter (Signed)
Spoke to pt and wife on speaker phone. ?Concerned as pt is now on NO blood  pressure medication currently. ?He has not had Metoprolol dose since yesterday morning. ?His Zestoretic was d/c by [facility] in March after mitral valve repair. ?This morning blood pressure 125/81 ?Last night 123/75 ?Yesterday afternoon around 3pm 118/80. ? ?Advised to take BP twice daily for next week/two, and/or symptomatic (take at least daily thereafter) ?Made aware that currently BP normal and no concern. ?Advised to let office know if see numbers greater than 130s/80s. ?Patient verbalized understanding and agreeable to plan.  ? ? ?

## 2022-02-28 ENCOUNTER — Other Ambulatory Visit: Payer: Self-pay | Admitting: Internal Medicine

## 2022-02-28 NOTE — Telephone Encounter (Signed)
Orbie Pyo, MD ?to [facility] Triage  10:14 AM Feb 27, 2022 ?Yes, let's have him stop it.  Thanks.  ?

## 2022-03-05 ENCOUNTER — Other Ambulatory Visit: Payer: Self-pay | Admitting: Internal Medicine

## 2022-03-05 ENCOUNTER — Telehealth: Payer: Self-pay | Admitting: Internal Medicine

## 2022-03-05 NOTE — Telephone Encounter (Signed)
Pt c/o swelling: STAT is pt has developed SOB within 24 hours ? ?If swelling, where is the swelling located? Feet, ankles, and calves  ? ?How much weight have you gained and in what time span? 5 lbs in a week  ? ?Have you gained 3 pounds in a day or 5 pounds in a week?  ? ?Do you have a log of your daily weights (if so, list)?  ? ?Are you currently taking a fluid pill? yes ? ?Are you currently SOB? A little bit  but not all the time. Patient states it resolves itself after using his inhaler  ? ?Have you traveled recently? No ? ?Patient wanted to know what to do in regards to the swelling. He also still has not received any advice as to whether or not to stop his other medication  ?

## 2022-03-05 NOTE — Telephone Encounter (Signed)
Yes right now we have a wait list  longer than we would like as we are transitioning the program to offer Intensive Cardiac rehab. We will be in contact with Mr Pelt when he is next on the list.  We will re verify insurance benefits for eligibility to participate in Intensive Cardiac rehab. If he does not qualify we will can enroll in traditional cardiac rehab.  Will go in greater detail for what all is involved with either program at scheduling.  ?We appreciate your patience  ?Carlette  ?  ? ? ?Forwarded the above message to patient. ?

## 2022-03-05 NOTE — Telephone Encounter (Signed)
Spoke w  patient and his wife. ?He has been off metoprolol and checking BPs. ?Readings are mostly 110s-130s over 70-80s. ?Aware to sit for at least 5 min w feet flat on floor before taking and will let Dr. Lynnette Caffey know if consistently getting readings >140/90. ? ?He has some increased swelling from below knees to ankles.  He does not add salt but could reduce some sodium in some of his foods.  He will take lasix 40 mg x 3 days and elevate legs when sitting.  He has not been to cardiac rehab yet but will start taking short walks in driveway.   He will let us know at the end of the week if improvement in swelling.  When he was here 4/24 and instructed to double lasix for 3 days there was improvement in the swelling. ? ?I told him I would send a message to cardiac rehab to check on his first appointment but that they work from a list and there is usually a wait.   ?

## 2022-03-07 ENCOUNTER — Ambulatory Visit (HOSPITAL_COMMUNITY): Payer: Medicare Other | Attending: Cardiology

## 2022-03-07 DIAGNOSIS — E785 Hyperlipidemia, unspecified: Secondary | ICD-10-CM | POA: Insufficient documentation

## 2022-03-07 DIAGNOSIS — I10 Essential (primary) hypertension: Secondary | ICD-10-CM | POA: Diagnosis not present

## 2022-03-07 DIAGNOSIS — I340 Nonrheumatic mitral (valve) insufficiency: Secondary | ICD-10-CM | POA: Insufficient documentation

## 2022-03-07 LAB — ECHOCARDIOGRAM COMPLETE
Area-P 1/2: 5.75 cm2
MV VTI: 1.43 cm2
S' Lateral: 3.5 cm

## 2022-03-09 ENCOUNTER — Telehealth: Payer: Self-pay | Admitting: Internal Medicine

## 2022-03-09 MED ORDER — FUROSEMIDE 20 MG PO TABS: 20.0000 mg | ORAL_TABLET | Freq: Two times a day (BID) | ORAL | 3 refills | Status: DC

## 2022-03-09 NOTE — Telephone Encounter (Signed)
Message from Dr. Lynnette Caffey: ?Fine to be on BID lasix.   ? ? ?Called and informed patient's wife to continue lasix 20 mg BID.  New prescription sent. ?

## 2022-03-09 NOTE — Telephone Encounter (Signed)
Feet ankles and legs are still swelling.  They go down overnight but within an hour of getting up they are starting to swell.  The swelling is not as bad as Monday before he doubled the lasix.    He is wearing ankle length compression socks, but will get longer ones.   ? ?His weights this week have been 185.4 - 184.4, 184.0 currently. ? ?He is aware I will review with Dr. Lynnette Caffey if he should continue on 40 mg lasix daily and if so should he have labs for this increase. ?

## 2022-03-09 NOTE — Telephone Encounter (Signed)
Pt c/o swelling: STAT is pt has developed SOB within 24 hours ? ?If swelling, where is the swelling located? Feet and ankles ? ?How much weight have you gained and in what time span? NA ? ?Have you gained 3 pounds in a day or 5 pounds in a week? NA ? ?Do you have a log of your daily weights (if so, list)? 184.4 ? ?Are you currently taking a fluid pill? Yes ? ?Are you currently SOB? No ? ?Have you traveled recently? No ? ? ? ?

## 2022-03-12 ENCOUNTER — Telehealth: Payer: Self-pay

## 2022-03-12 DIAGNOSIS — E785 Hyperlipidemia, unspecified: Secondary | ICD-10-CM

## 2022-03-12 NOTE — Telephone Encounter (Signed)
Called and scheduled fasting lab work post 4th dose and pt voiced understanding  ?

## 2022-03-12 NOTE — Telephone Encounter (Signed)
-----   Message from Olene Floss, RPH-CPP sent at 03/12/2022 11:07 AM EDT ----- ?Please set up lipids/apob/lft thanks ?----- Message ----- ?From: Olene Floss, RPH-CPP ?Sent: 03/12/2022  12:00 AM EDT ?To: Olene Floss, RPH-CPP ? ? ?----- Message ----- ?From: Olene Floss, RPH-CPP ?Sent: 02/09/2022  12:00 AM EDT ?To: Olene Floss, RPH-CPP ? ?Set up lipids ? ? ? ?

## 2022-03-13 ENCOUNTER — Other Ambulatory Visit: Payer: Medicare Other | Admitting: *Deleted

## 2022-03-13 DIAGNOSIS — E785 Hyperlipidemia, unspecified: Secondary | ICD-10-CM

## 2022-03-14 ENCOUNTER — Telehealth: Payer: Self-pay | Admitting: Pharmacist

## 2022-03-14 LAB — HEPATIC FUNCTION PANEL
ALT: 11 IU/L (ref 0–44)
AST: 15 IU/L (ref 0–40)
Albumin: 4.1 g/dL (ref 3.8–4.8)
Alkaline Phosphatase: 58 IU/L (ref 44–121)
Bilirubin Total: 0.3 mg/dL (ref 0.0–1.2)
Bilirubin, Direct: 0.11 mg/dL (ref 0.00–0.40)
Total Protein: 6.4 g/dL (ref 6.0–8.5)

## 2022-03-14 LAB — LIPID PANEL
Chol/HDL Ratio: 6.4 ratio — ABNORMAL HIGH (ref 0.0–5.0)
Cholesterol, Total: 193 mg/dL (ref 100–199)
HDL: 30 mg/dL — ABNORMAL LOW (ref >39)
LDL Chol Calc (NIH): 128 mg/dL — ABNORMAL HIGH (ref 0–99)
Triglycerides: 196 mg/dL — ABNORMAL HIGH (ref 0–149)
VLDL Cholesterol Cal: 35 mg/dL (ref 5–40)

## 2022-03-14 LAB — APOLIPOPROTEIN B: Apolipoprotein B: 117 mg/dL — ABNORMAL HIGH (ref <90)

## 2022-03-14 NOTE — Telephone Encounter (Signed)
Spoke with patient and his wife about labs. LDL-C improved only slightly. ApoB is elevated. Patient is doing ok on rosuvastatin 5mg . Taking it every other day. We discussed adding PCSK9i (most ideal option) vs adding zetia. We reviewed cost and side effects. Patient will get back to me on what he decides. ?

## 2022-03-30 ENCOUNTER — Encounter: Payer: Self-pay | Admitting: Pharmacist

## 2022-04-02 ENCOUNTER — Other Ambulatory Visit: Payer: Self-pay | Admitting: Family Medicine

## 2022-04-03 ENCOUNTER — Ambulatory Visit: Payer: Medicare Other

## 2022-04-05 ENCOUNTER — Encounter (HOSPITAL_COMMUNITY): Payer: Self-pay

## 2022-04-05 ENCOUNTER — Telehealth (HOSPITAL_COMMUNITY): Payer: Self-pay

## 2022-04-05 ENCOUNTER — Other Ambulatory Visit: Payer: Self-pay | Admitting: Family Medicine

## 2022-04-05 NOTE — Telephone Encounter (Signed)
Pt returned phone call, and stated that he is going out of town in July and will call back at a later date. Closed referral.

## 2022-04-06 NOTE — Progress Notes (Unsigned)
   I, [name], LAT, ATC, am serving as scribe for Dr. Clementeen Graham.  Frank [name] is a 71 y.o. male who presents to [facility] at [facility] today for f/u of chronic knee pain. He was last seen by Dr. Denyse Amass on 05/30/21 for R ankle pain.  Today, pt reports R/L knee pain x .  He locates his pain to .  Knee swelling: Knee mechanical symptoms: Aggravating factors: Treatments tried:  Diagnostic testing: R knee XR- 06/11/21; R and L knee XR- 03/14/20  Pertinent review of systems: ***  Relevant historical information: ***   Exam:  There were no vitals taken for this visit. General: Well Developed, well nourished, and in no acute distress.   MSK: ***    Lab and Radiology Results No results found for this or any previous visit (from the past 72 hour(s)). No results found.     Assessment and Plan: 71 y.o. male with ***   PDMP not reviewed this encounter. No orders of the defined types were placed in this encounter.  No orders of the defined types were placed in this encounter.    Discussed warning signs or symptoms. Please see discharge instructions. Patient expresses understanding.   ***

## 2022-04-09 ENCOUNTER — Ambulatory Visit: Payer: Medicare Other | Admitting: Family Medicine

## 2022-04-09 ENCOUNTER — Ambulatory Visit: Payer: Self-pay

## 2022-04-09 ENCOUNTER — Encounter: Payer: Self-pay | Admitting: Family Medicine

## 2022-04-09 ENCOUNTER — Ambulatory Visit (INDEPENDENT_AMBULATORY_CARE_PROVIDER_SITE_OTHER): Payer: Medicare Other

## 2022-04-09 VITALS — BP 130/90 | HR 86 | Ht 62.0 in | Wt 186.0 lb

## 2022-04-09 DIAGNOSIS — G8929 Other chronic pain: Secondary | ICD-10-CM

## 2022-04-09 DIAGNOSIS — M25562 Pain in left knee: Secondary | ICD-10-CM | POA: Diagnosis not present

## 2022-04-09 DIAGNOSIS — M25561 Pain in right knee: Secondary | ICD-10-CM | POA: Diagnosis not present

## 2022-04-09 NOTE — Patient Instructions (Addendum)
Good to see you today.  We will work on getting you authorized for gel injections for your knees and someone from our office will call you to let you know the status once we hear back from your insurance.  I've referred you to PT at [facility].  Their office will call you to schedule but please let us know if you haven't heard from them in one week regarding scheduling.  Please get an Xray today before you leave.  Follow-up: 6 weeks

## 2022-04-10 NOTE — Progress Notes (Signed)
Left knee x-ray shows severe arthritis changes.

## 2022-04-10 NOTE — Progress Notes (Signed)
Right knee x-ray shows severe arthritis changes.

## 2022-04-12 ENCOUNTER — Telehealth: Payer: Self-pay | Admitting: *Deleted

## 2022-04-12 NOTE — Telephone Encounter (Signed)
Beyond Tylenol there is not a truly safe medicine for you to take for your knee pain.  Diclofenac pills or other ibuprofen like medications are tough on their blood pressure and heart and stomach and should be used only occasionally.  Beyond that are opiates like tramadol, hydrocodone, and oxycodone which are fraught with problems.  I can prescribe tramadol or hydrocodone on a limited basis but this cannot be a long-term medication.    Do you want me to prescribe an opiate-based medication for pain?

## 2022-04-12 NOTE — Telephone Encounter (Signed)
Called pt and spoke to him and his wife regarding Dr. Zollie Pee advice.  Pt states that he would like to try Tramadol of the options provided.  Please send to [facility] on [address]

## 2022-04-12 NOTE — Telephone Encounter (Signed)
Pt called requesting to have a rx to help with his knee pain sent into the [facility] on [address]

## 2022-04-13 MED ORDER — TRAMADOL HCL 50 MG PO TABS: 50.0000 mg | ORAL_TABLET | Freq: Three times a day (TID) | ORAL | 0 refills | Status: DC | PRN

## 2022-04-13 NOTE — Telephone Encounter (Signed)
Tr[facility]adol sent to the [facility] on [address].

## 2022-04-13 NOTE — Telephone Encounter (Signed)
Called and informed pt.  

## 2022-04-17 ENCOUNTER — Encounter: Payer: Self-pay | Admitting: Physical Therapy

## 2022-04-17 ENCOUNTER — Ambulatory Visit (INDEPENDENT_AMBULATORY_CARE_PROVIDER_SITE_OTHER): Payer: Medicare Other | Admitting: Physical Therapy

## 2022-04-17 DIAGNOSIS — M25561 Pain in right knee: Secondary | ICD-10-CM | POA: Diagnosis not present

## 2022-04-17 DIAGNOSIS — G8929 Other chronic pain: Secondary | ICD-10-CM | POA: Diagnosis not present

## 2022-04-17 DIAGNOSIS — M25562 Pain in left knee: Secondary | ICD-10-CM | POA: Diagnosis not present

## 2022-04-17 NOTE — Therapy (Signed)
OUTPATIENT PHYSICAL THERAPY LOWER EXTREMITY EVALUATION   Patient Name: Frank [name] MRN: [id number] DOB:[birth year], 71 y.o., male Today's Date: 04/17/2022   PT End of Session - 04/17/22 1405     Visit Number 1    Number of Visits 12    Date for PT Re-Evaluation 05/29/22    Authorization Type [employer] Medicare    PT Start Time 1017    PT Stop Time 1100    PT Time Calculation (min) 43 min    Activity Tolerance Patient tolerated treatment well    Behavior During Therapy [facility] for tasks assessed/performed             Past Medical History:  Diagnosis Date   Allergy    Asthma    GERD (gastroesophageal reflux disease)    Headache(784.0)    Hypertension    Past Surgical History:  Procedure Laterality Date   HERNIA REPAIR  1969, 2009   kidney stones  05/20/95   KNEE SURGERY  02/1989   NASAL SINUS SURGERY  10/24/1992   RIGHT/LEFT HEART CATH AND CORONARY ANGIOGRAPHY N/A 11/15/2021   Procedure: RIGHT/LEFT HEART CATH AND CORONARY ANGIOGRAPHY;  Surgeon: Orbie Pyo, MD;  Location: [facility] INVAS[facility];  Service: Cardiovascular;  Laterality: N/A;   ROTATOR CUFF REPAIR  04/25/93   TEE WITHOUT CARDIOVERSION N/A 08/24/2021   Procedure: TRANSESOPHAGEAL ECHOCARDIOGRAM (TEE);  Surgeon: Jodelle Red, MD;  Location: [facility] ENDOSCOPY;  Service: Cardiovascular;  Laterality: N/A;   Patient Active Problem List   Diagnosis Date Noted   Nonrheumatic mitral valve regurgitation s/p repair 2023    Peroneal tendinitis of right lower extremity 04/25/2021   Benign localized prostatic hyperplasia with lower urinary tract symptoms (LUTS) 09/27/2020   Wheezing 09/14/2020   Chondrocalcinosis 03/14/2020   Dyslipidemia 09/23/2017   Bilateral primary osteoarthritis of knee 09/02/2017   Neck pain 02/15/2017   Primary osteoarthritis of right hip 02/15/2017   Tear of right acetabular labrum 02/15/2017   Right leg pain 01/04/2017   Obese 08/17/2015   Elevated blood sugar 08/17/2015   Shingles  09/10/2014   Dyspnea 11/27/2011   HERNIA, UMBILICAL 08/02/2008   Essential hypertension 01/30/2008   Allergic rhinitis 01/30/2008   GERD 01/30/2008   HEADACHE 01/30/2008    PCP: Jacquiline Doe  REFERRING PROVIDER: Clementeen Graham   REFERRING DIAG: Bil knee pain/ OA  THERAPY DIAG:  Chronic pain of left knee  Chronic pain of right knee  Rationale for Evaluation and Treatment Rehabilitation  ONSET DATE:   SUBJECTIVE:   SUBJECTIVE STATEMENT: Had recent mitral valve repair March 2023.   Has not had cardiac rehab, may schedule in future.  Has had bil knee pain for while, but increased pain since surgery.  Bil knee pain , L> R  Difficulty walking due to pain.  Candidate for gel shots.  Increased pain with walking, Stairs, hills. Not currently doing HEP.    PERTINENT HISTORY: mitral valve repair March 2023, previous heart cath, OA  PAIN:  Are you having pain? Yes: NPRS scale: 5/10 Pain location: L knee  Pain description: ache, sore Aggravating factors: Increased standing, walking, stairs,  Relieving factors: none stated   Are you having pain? Yes: NPRS scale: 3/10 Pain location: R knee Pain description: ache, sore Aggravating factors: Increased standing, walking, stairs, Relieving factors: none stated   PRECAUTIONS: None  WEIGHT BEARING RESTRICTIONS No  FALLS:  Has patient fallen in last 6 months? No  PLOF: Independent  PATIENT GOALS   Decreased pain, walk better.  OBJECTIVE:   DIAGNOSTIC FINDINGS:  R: IMPRESSION: 1. Severe medial compartment joint space narrowing is worsened from prior, with now new lateral subluxation of the proximal tibia with respect of the distal femur. 2. Moderate patellofemoral osteoarthritis  L: IMPRESSION: Severe medial compartment, moderate to severe patellofemoral compartment, and moderate lateral compartment osteoarthritis   COGNITION:  Overall cognitive status: Within functional limits for tasks  assessed     SENSATION: WFL   POSTURE: Varus knees bilaterally    LOWER EXTREMITY ROM:  Active ROM Right eval Left eval  Hip flexion    Hip extension    Hip abduction    Hip adduction    Hip internal rotation    Hip external rotation    Knee flexion 125 120  Knee extension 0 -4  Ankle dorsiflexion    Ankle plantarflexion    Ankle inversion    Ankle eversion     (Blank rows = not tested)  LOWER EXTREMITY MMT:  MMT Right eval Left eval  Hip flexion 4 4  Hip extension    Hip abduction 4 4  Hip adduction    Hip internal rotation    Hip external rotation    Knee flexion 4+ 4+  Knee extension 4+ 4  Ankle dorsiflexion    Ankle plantarflexion    Ankle inversion    Ankle eversion     (Blank rows = not tested)   GAIT: Distance walked: 100 Assistive device utilized: None Level of assistance: Complete Independence Comments: Increased lateral sway, decreased knee flexion with swing, bilaterally. Varus knees.    TODAY'S TREATMENT: See below for HEP   PATIENT EDUCATION:  Education details: PT POC, Exam findings, HEP  Person educated: Patient and Spouse Education method: Explanation, Demonstration, Tactile cues, Verbal cues, and Handouts Education comprehension: verbalized understanding, returned demonstration, verbal cues required, tactile cues required, and needs further education   HOME Exercise : Access Code: [email or web address]  URL: [email or web address] Date: 04/17/2022 Prepared by: Sedalia Muta  Exercises - Supine Quadricep Sets  - 2 x daily - 2 sets - 10 reps - Seated Knee Extension AROM  - 2 x daily - 1-2 sets - 10 reps - Seated Hamstring Stretch  - 2 x daily - 3 reps - 30 hold - Seated Ankle Pumps on Table  - 3 x daily - 1 sets - 10 reps   ASSESSMENT:  CLINICAL IMPRESSION: Patient presents with primary complaint of increased pain bil knees. He has symptoms consistent with significant OA. He has minimal ROM deficits, but does have  decreased strength and stability. He has decreased ability for full functional activities, walking and stairs, and will benefit from skilled PT to improve.    OBJECTIVE IMPAIRMENTS Abnormal gait, cardiopulmonary status limiting activity, decreased activity tolerance, decreased endurance, decreased knowledge of use of DME, decreased mobility, difficulty walking, decreased ROM, decreased strength, increased muscle spasms, impaired flexibility, improper body mechanics, and pain.   ACTIVITY LIMITATIONS carrying, lifting, bending, standing, squatting, stairs, transfers, and locomotion level  PARTICIPATION LIMITATIONS: cleaning, driving, community activity, and yard work  PERSONAL FACTORS Time since onset of injury/illness/exacerbation are also affecting patient's functional outcome.   REHAB POTENTIAL: Fair severe OA   CLINICAL DECISION MAKING: Stable/uncomplicated  EVALUATION COMPLEXITY: Low   GOALS: Goals reviewed with patient? Yes  SHORT TERM GOALS:  STG Name Target Date Goal status  1 Patient will be independent with initial HEP   05/01/22  INITIAL     INITIAL         LONG  TERM GOALS:   LTG Name Target Date Goal status  1 Patient will be independent with final HEP  05/29/22 INITIAL  2 Pt to report decreased pain in  to 0-3/10 in bil knees with activity   05/29/22 INITIAL  3 Pt to demonstrate improved strength of  bil knees and hips to be at least 4+/5, to improve ability for gait and stairs   05/29/22 INITIAL        4 Pt to demo ability for stair navigation, reciprocal pattern, with 1 UE support, to improve ability for home and community navigation.   05/29/22 INITIAL      PLAN: PT FREQUENCY: 2x/week  PT DURATION: 6 weeks  PLANNED INTERVENTIONS: Therapeutic exercises, Therapeutic activity, Neuromuscular re-education, Balance training, Gait training, Patient/Family education, Joint manipulation, Joint mobilization, DME instructions, Dry Needling, Electrical stimulation, Spinal  manipulation, Spinal mobilization, Cryotherapy, Moist heat, Taping, Vasopneumatic device, Traction, Ultrasound, Ionotophoresis 4mg /ml Dexamethasone, and Manual therapy  PLAN FOR NEXT SESSION:   , PT, DPT 2:17 PM  04/17/22

## 2022-04-19 ENCOUNTER — Encounter: Payer: Self-pay | Admitting: Physical Therapy

## 2022-04-19 ENCOUNTER — Ambulatory Visit (INDEPENDENT_AMBULATORY_CARE_PROVIDER_SITE_OTHER): Payer: Medicare Other | Admitting: Physical Therapy

## 2022-04-19 DIAGNOSIS — M25562 Pain in left knee: Secondary | ICD-10-CM

## 2022-04-19 DIAGNOSIS — G8929 Other chronic pain: Secondary | ICD-10-CM | POA: Diagnosis not present

## 2022-04-19 DIAGNOSIS — M25561 Pain in right knee: Secondary | ICD-10-CM

## 2022-04-19 NOTE — Therapy (Signed)
OUTPATIENT PHYSICAL THERAPY LOWER EXTREMITY TREATMENT   Patient Name: Frank [name] MRN: [id number] DOB:08/09/1951, 71 y.o., male Today's Date: 04/19/2022   PT End of Session - 04/19/22 1520     Visit Number 2    Number of Visits 12    Date for PT Re-Evaluation 05/29/22    Authorization Type [employer] Medicare    PT Start Time 1520    PT Stop Time 1600    PT Time Calculation (min) 40 min    Activity Tolerance Patient tolerated treatment well    Behavior During Therapy [facility] for tasks assessed/performed             Past Medical History:  Diagnosis Date   Allergy    Asthma    GERD (gastroesophageal reflux disease)    Headache(784.0)    Hypertension    Past Surgical History:  Procedure Laterality Date   HERNIA REPAIR  1969, 2009   kidney stones  05/20/95   KNEE SURGERY  02/1989   NASAL SINUS SURGERY  10/24/1992   RIGHT/LEFT HEART CATH AND CORONARY ANGIOGRAPHY N/A 11/15/2021   Procedure: RIGHT/LEFT HEART CATH AND CORONARY ANGIOGRAPHY;  Surgeon: Orbie Pyo, MD;  Location: [facility] INVASIVE CV LAB;  Service: Cardiovascular;  Laterality: N/A;   ROTATOR CUFF REPAIR  04/25/93   TEE WITHOUT CARDIOVERSION N/A 08/24/2021   Procedure: TRANSESOPHAGEAL ECHOCARDIOGRAM (TEE);  Surgeon: Jodelle Red, MD;  Location: [facility] ENDOSCOPY;  Service: Cardiovascular;  Laterality: N/A;   Patient Active Problem List   Diagnosis Date Noted   Nonrheumatic mitral valve regurgitation s/p repair 2023    Peroneal tendinitis of right lower extremity 04/25/2021   Benign localized prostatic hyperplasia with lower urinary tract symptoms (LUTS) 09/27/2020   Wheezing 09/14/2020   Chondrocalcinosis 03/14/2020   Dyslipidemia 09/23/2017   Bilateral primary osteoarthritis of knee 09/02/2017   Neck pain 02/15/2017   Primary osteoarthritis of right hip 02/15/2017   Tear of right acetabular labrum 02/15/2017   Right leg pain 01/04/2017   Obese 08/17/2015   Elevated blood sugar 08/17/2015   Shingles  09/10/2014   Dyspnea 11/27/2011   HERNIA, UMBILICAL 08/02/2008   Essential hypertension 01/30/2008   Allergic rhinitis 01/30/2008   GERD 01/30/2008   HEADACHE 01/30/2008    PCP: Jacquiline Doe  REFERRING PROVIDER: Clementeen Graham   REFERRING DIAG: Bil knee pain/ OA  THERAPY DIAG:  Chronic pain of left knee  Chronic pain of right knee  Rationale for Evaluation and Treatment Rehabilitation  ONSET DATE:   SUBJECTIVE:   SUBJECTIVE STATEMENT:  Pt with no new complaints. Knees sore today.    Eval: Had recent mitral valve repair March 2023.   Has not had cardiac rehab, may schedule in future.  Has had bil knee pain for while, but increased pain since surgery.  Bil knee pain , L> R  Difficulty walking due to pain.  Candidate for gel shots.  Increased pain with walking, Stairs, hills. Not currently doing HEP.    PERTINENT HISTORY: mitral valve repair March 2023, previous heart cath, OA  PAIN:  Are you having pain? Yes: NPRS scale: 5/10 Pain location: L knee  Pain description: ache, sore Aggravating factors: Increased standing, walking, stairs,  Relieving factors: none stated   Are you having pain? Yes: NPRS scale: 3/10 Pain location: R knee Pain description: ache, sore Aggravating factors: Increased standing, walking, stairs, Relieving factors: none stated   PRECAUTIONS: None  WEIGHT BEARING RESTRICTIONS No  FALLS:  Has patient fallen in last 6 months? No  PLOF: Independent  PATIENT GOALS   Decreased pain, walk better.    OBJECTIVE:   DIAGNOSTIC FINDINGS:  R: IMPRESSION: 1. Severe medial compartment joint space narrowing is worsened from prior, with now new lateral subluxation of the proximal tibia with respect of the distal femur. 2. Moderate patellofemoral osteoarthritis  L: IMPRESSION: Severe medial compartment, moderate to severe patellofemoral compartment, and moderate lateral compartment osteoarthritis   COGNITION:  Overall cognitive status:  Within functional limits for tasks assessed     SENSATION: WFL   POSTURE: Varus knees bilaterally    LOWER EXTREMITY ROM:  Active ROM Right eval Left eval  Hip flexion    Hip extension    Hip abduction    Hip adduction    Hip internal rotation    Hip external rotation    Knee flexion 125 120  Knee extension 0 -4  Ankle dorsiflexion    Ankle plantarflexion    Ankle inversion    Ankle eversion     (Blank rows = not tested)  LOWER EXTREMITY MMT:  MMT Right eval Left eval  Hip flexion 4 4  Hip extension    Hip abduction 4 4  Hip adduction    Hip internal rotation    Hip external rotation    Knee flexion 4+ 4+  Knee extension 4+ 4  Ankle dorsiflexion    Ankle plantarflexion    Ankle inversion    Ankle eversion     (Blank rows = not tested)   GAIT: Distance walked: 100 Assistive device utilized: None Level of assistance: Complete Independence Comments: Increased lateral sway, decreased knee flexion with swing, bilaterally. Varus knees.    TODAY'S TREATMENT:  04/19/2022  Therapeutic Exercise: Aerobic: Recumbent bike L1 x 3 min (HR readings 86- 102)  Supine: QS x20 bil;  SLR  x10 bil;  Heel slides x 10 bil;  Seated: LAQ 2.5 lb x 20 bil; Standing: Stretches: Seated HSS 30 sec x 3 bil;  Neuromuscular Re-education: Manual Therapy: STM posterior knee, distal/lateral HS on L; PROM L knee, jt mobs for flex and ext.    PATIENT EDUCATION:  Education details: updated and reviewed HEP.  Person educated: Patient and Spouse Education method: Explanation, Demonstration, Tactile cues, Verbal cues, and Handouts Education comprehension: verbalized understanding, returned demonstration, verbal cues required, tactile cues required, and needs further education   HOME Exercise : Access Code: [email or web address]   ASSESSMENT:  CLINICAL IMPRESSION: Pt with good ability for exercises today. Reviewed HEP for optimal mechanics. Pt with Heart rate WNL with activity today. He does  have irregularity with manual testing. Unsure if this is his regular baseline, discussed with pt and will report to MD. Pt non symptomatic. Discussed seeking medical attention of symptomatic. Pt and wife state understanding.  Plan to progress strengthening for knees as tolerated.    OBJECTIVE IMPAIRMENTS Abnormal gait, cardiopulmonary status limiting activity, decreased activity tolerance, decreased endurance, decreased knowledge of use of DME, decreased mobility, difficulty walking, decreased ROM, decreased strength, increased muscle spasms, impaired flexibility, improper body mechanics, and pain.   ACTIVITY LIMITATIONS carrying, lifting, bending, standing, squatting, stairs, transfers, and locomotion level  PARTICIPATION LIMITATIONS: cleaning, driving, community activity, and yard work  PERSONAL FACTORS Time since onset of injury/illness/exacerbation are also affecting patient's functional outcome.   REHAB POTENTIAL: Fair severe OA   CLINICAL DECISION MAKING: Stable/uncomplicated  EVALUATION COMPLEXITY: Low   GOALS: Goals reviewed with patient? Yes  SHORT TERM GOALS:  STG Name Target Date Goal status  1 Patient will be  independent with initial HEP   05/01/22  INITIAL     INITIAL         LONG TERM GOALS:   LTG Name Target Date Goal status  1 Patient will be independent with final HEP  05/29/22 INITIAL  2 Pt to report decreased pain in  to 0-3/10 in bil knees with activity   05/29/22 INITIAL  3 Pt to demonstrate improved strength of  bil knees and hips to be at least 4+/5, to improve ability for gait and stairs   05/29/22 INITIAL        4 Pt to demo ability for stair navigation, reciprocal pattern, with 1 UE support, to improve ability for home and community navigation.   05/29/22 INITIAL      PLAN: PT FREQUENCY: 2x/week  PT DURATION: 6 weeks  PLANNED INTERVENTIONS: Therapeutic exercises, Therapeutic activity, Neuromuscular re-education, Balance training, Gait training,  Patient/Family education, Joint manipulation, Joint mobilization, DME instructions, Dry Needling, Electrical stimulation, Spinal manipulation, Spinal mobilization, Cryotherapy, Moist heat, Taping, Vasopneumatic device, Traction, Ultrasound, Ionotophoresis 4mg /ml Dexamethasone, and Manual therapy  PLAN FOR NEXT SESSION:   , PT, DPT 3:21 PM  04/19/22

## 2022-04-20 ENCOUNTER — Telehealth (HOSPITAL_COMMUNITY): Payer: Self-pay

## 2022-04-25 ENCOUNTER — Encounter: Payer: Self-pay | Admitting: Physical Therapy

## 2022-04-25 ENCOUNTER — Other Ambulatory Visit: Payer: Self-pay | Admitting: Family Medicine

## 2022-04-25 ENCOUNTER — Ambulatory Visit (INDEPENDENT_AMBULATORY_CARE_PROVIDER_SITE_OTHER): Payer: Medicare Other | Admitting: Physical Therapy

## 2022-04-25 DIAGNOSIS — M25562 Pain in left knee: Secondary | ICD-10-CM | POA: Diagnosis not present

## 2022-04-25 DIAGNOSIS — M6281 Muscle weakness (generalized): Secondary | ICD-10-CM | POA: Diagnosis not present

## 2022-04-25 DIAGNOSIS — R262 Difficulty in walking, not elsewhere classified: Secondary | ICD-10-CM | POA: Diagnosis not present

## 2022-04-25 DIAGNOSIS — G8929 Other chronic pain: Secondary | ICD-10-CM

## 2022-04-25 DIAGNOSIS — M25561 Pain in right knee: Secondary | ICD-10-CM

## 2022-04-25 NOTE — Therapy (Addendum)
OUTPATIENT PHYSICAL THERAPY LOWER EXTREMITY TREATMENT   Patient Name: Frank [name] MRN: [id number] DOB:[birth year], 71 y.o., male Today's Date: 04/25/2022   PT End of Session - 04/25/22 0850     Visit Number 3    Number of Visits 12    Date for PT Re-Evaluation 05/29/22    Authorization Type [employer] Medicare    PT Start Time 0850    PT Stop Time 0928    PT Time Calculation (min) 38 min    Activity Tolerance Patient tolerated treatment well    Behavior During Therapy [facility] for tasks assessed/performed             Past Medical History:  Diagnosis Date   Allergy    Asthma    GERD (gastroesophageal reflux disease)    Headache(784.0)    Hypertension    Past Surgical History:  Procedure Laterality Date   HERNIA REPAIR  1969, 2009   kidney stones  05/20/95   KNEE SURGERY  02/1989   NASAL SINUS SURGERY  10/24/1992   RIGHT/LEFT HEART CATH AND CORONARY ANGIOGRAPHY N/A 11/15/2021   Procedure: RIGHT/LEFT HEART CATH AND CORONARY ANGIOGRAPHY;  Surgeon: Orbie Pyo, MD;  Location: [facility] INVASIVE CV LAB;  Service: Cardiovascular;  Laterality: N/A;   ROTATOR CUFF REPAIR  04/25/93   TEE WITHOUT CARDIOVERSION N/A 08/24/2021   Procedure: TRANSESOPHAGEAL ECHOCARDIOGRAM (TEE);  Surgeon: Jodelle Red, MD;  Location: [facility] ENDOSCOPY;  Service: Cardiovascular;  Laterality: N/A;   Patient Active Problem List   Diagnosis Date Noted   Nonrheumatic mitral valve regurgitation s/p repair 2023    Peroneal tendinitis of right lower extremity 04/25/2021   Benign localized prostatic hyperplasia with lower urinary tract symptoms (LUTS) 09/27/2020   Wheezing 09/14/2020   Chondrocalcinosis 03/14/2020   Dyslipidemia 09/23/2017   Bilateral primary osteoarthritis of knee 09/02/2017   Neck pain 02/15/2017   Primary osteoarthritis of right hip 02/15/2017   Tear of right acetabular labrum 02/15/2017   Right leg pain 01/04/2017   Obese 08/17/2015   Elevated blood sugar 08/17/2015   Shingles  09/10/2014   Dyspnea 11/27/2011   HERNIA, UMBILICAL 08/02/2008   Essential hypertension 01/30/2008   Allergic rhinitis 01/30/2008   GERD 01/30/2008   HEADACHE 01/30/2008    PCP: Jacquiline Doe  REFERRING PROVIDER: Clementeen Graham   REFERRING DIAG: Bil knee pain/ OA  THERAPY DIAG:  Chronic pain of left knee  Chronic pain of right knee  Pain in joint of right knee  Pain in joint of left knee  Muscle weakness (generalized)  Difficulty walking  Rationale for Evaluation and Treatment Rehabilitation  ONSET DATE:   SUBJECTIVE:   SUBJECTIVE STATEMENT:  States his left knee is sore behind the knee    Eval: Had recent mitral valve repair March 2023.   Has not had cardiac rehab, may schedule in future.  Has had bil knee pain for while, but increased pain since surgery.  Bil knee pain , L> R  Difficulty walking due to pain.  Candidate for gel shots.  Increased pain with walking, Stairs, hills. Not currently doing HEP.    PERTINENT HISTORY: mitral valve repair March 2023, previous heart cath, OA  PAIN:  Are you having pain? Yes: NPRS scale: 3/10 Pain location: L knee  Pain description: ache, sore Aggravating factors: Increased standing, walking, stairs,  Relieving factors: none stated   Are you having pain? Yes: NPRS scale: 3/10 Pain location: R knee Pain description: ache, sore Aggravating factors: Increased standing, walking, stairs, Relieving factors: none  stated   PRECAUTIONS: None  WEIGHT BEARING RESTRICTIONS No  FALLS:  Has patient fallen in last 6 months? No  PLOF: Independent  PATIENT GOALS   Decreased pain, walk better.    OBJECTIVE:   DIAGNOSTIC FINDINGS:  R: IMPRESSION: 1. Severe medial compartment joint space narrowing is worsened from prior, with now new lateral subluxation of the proximal tibia with respect of the distal femur. 2. Moderate patellofemoral osteoarthritis  L: IMPRESSION: Severe medial compartment, moderate to severe  patellofemoral compartment, and moderate lateral compartment osteoarthritis   COGNITION:  Overall cognitive status: Within functional limits for tasks assessed     SENSATION: WFL   POSTURE: Varus knees bilaterally    LOWER EXTREMITY ROM:  Active ROM Right eval Left eval  Hip flexion    Hip extension    Hip abduction    Hip adduction    Hip internal rotation    Hip external rotation    Knee flexion 125 120  Knee extension 0 -4  Ankle dorsiflexion    Ankle plantarflexion    Ankle inversion    Ankle eversion     (Blank rows = not tested)  LOWER EXTREMITY MMT:  MMT Right eval Left eval  Hip flexion 4 4  Hip extension    Hip abduction 4 4  Hip adduction    Hip internal rotation    Hip external rotation    Knee flexion 4+ 4+  Knee extension 4+ 4  Ankle dorsiflexion    Ankle plantarflexion    Ankle inversion    Ankle eversion     (Blank rows = not tested)   GAIT: Distance walked: 100 Assistive device utilized: None Level of assistance: Complete Independence Comments: Increased lateral sway, decreased knee flexion with swing, bilaterally. Varus knees.    TODAY'S TREATMENT:  04/25/2022  Therapeutic Exercise: Aerobic:  Supine: ball knee flexion 3 minutes, hamstring iso on ball 3 minutes, SAQs on large ball 4 minutes B, bridges x3 1 minutes   Seated:  Standing: Stretches: Seated HSS 30 sec x 3 bil;  Neuromuscular Re-education: Manual Therapy: tibial mobilizations B on ball - tolerated well, traction to right knee - twitching - not tolerated well     PATIENT EDUCATION:  Education details: eviewed HEP.  Person educated: Patient and Spouse Education method: Explanation, Demonstration, Tactile cues, Verbal cues, and Handouts Education comprehension: verbalized understanding, returned demonstration, verbal cues required, tactile cues required, and needs further education   HOME Exercise : Access Code: [email or web address]   ASSESSMENT:  CLINICAL  IMPRESSION: 04/25/2022 Session focused on continued strengthening. Trailed tibial mobilizations which was tolerated well. Traction of knee not tolerated as left leg started to spasm. Instructed patient to put leg on floor which stopped spasms. Added bridges to HEP. Will continue with current POC     OBJECTIVE IMPAIRMENTS Abnormal gait, cardiopulmonary status limiting activity, decreased activity tolerance, decreased endurance, decreased knowledge of use of DME, decreased mobility, difficulty walking, decreased ROM, decreased strength, increased muscle spasms, impaired flexibility, improper body mechanics, and pain.   ACTIVITY LIMITATIONS carrying, lifting, bending, standing, squatting, stairs, transfers, and locomotion level  PARTICIPATION LIMITATIONS: cleaning, driving, community activity, and yard work  PERSONAL FACTORS Time since onset of injury/illness/exacerbation are also affecting patient's functional outcome.   REHAB POTENTIAL: Fair severe OA   CLINICAL DECISION MAKING: Stable/uncomplicated  EVALUATION COMPLEXITY: Low   GOALS: Goals reviewed with patient? Yes  SHORT TERM GOALS:  STG Name Target Date Goal status  1 Patient will be independent with initial HEP  05/01/22  INITIAL     INITIAL         LONG TERM GOALS:   LTG Name Target Date Goal status  1 Patient will be independent with final HEP  05/29/22 INITIAL  2 Pt to report decreased pain in  to 0-3/10 in bil knees with activity   05/29/22 INITIAL  3 Pt to demonstrate improved strength of  bil knees and hips to be at least 4+/5, to improve ability for gait and stairs   05/29/22 INITIAL        4 Pt to demo ability for stair navigation, reciprocal pattern, with 1 UE support, to improve ability for home and community navigation.   05/29/22 INITIAL      PLAN: PT FREQUENCY: 2x/week  PT DURATION: 6 weeks  PLANNED INTERVENTIONS: Therapeutic exercises, Therapeutic activity, Neuromuscular re-education, Balance training,  Gait training, Patient/Family education, Joint manipulation, Joint mobilization, DME instructions, Dry Needling, Electrical stimulation, Spinal manipulation, Spinal mobilization, Cryotherapy, Moist heat, Taping, Vasopneumatic device, Traction, Ultrasound, Ionotophoresis 4mg /ml Dexamethasone, and Manual therapy  PLAN FOR NEXT SESSION:   2:33 PM, 04/30/22 07/01/22, DPT Physical Therapy with [facility]

## 2022-04-30 ENCOUNTER — Encounter: Payer: Self-pay | Admitting: Physical Therapy

## 2022-04-30 ENCOUNTER — Telehealth: Payer: Self-pay | Admitting: Internal Medicine

## 2022-04-30 ENCOUNTER — Ambulatory Visit (INDEPENDENT_AMBULATORY_CARE_PROVIDER_SITE_OTHER): Payer: Medicare Other | Admitting: Physical Therapy

## 2022-04-30 DIAGNOSIS — G8929 Other chronic pain: Secondary | ICD-10-CM | POA: Diagnosis not present

## 2022-04-30 DIAGNOSIS — M6281 Muscle weakness (generalized): Secondary | ICD-10-CM

## 2022-04-30 DIAGNOSIS — R262 Difficulty in walking, not elsewhere classified: Secondary | ICD-10-CM

## 2022-04-30 DIAGNOSIS — M25562 Pain in left knee: Secondary | ICD-10-CM | POA: Diagnosis not present

## 2022-04-30 DIAGNOSIS — M25561 Pain in right knee: Secondary | ICD-10-CM | POA: Diagnosis not present

## 2022-04-30 NOTE — Therapy (Signed)
OUTPATIENT PHYSICAL THERAPY LOWER EXTREMITY TREATMENT   Patient Name: Frank [name] MRN: [id number] DOB:12-12-1950, 71 y.o., male Today's Date: 04/30/2022   PT End of Session - 04/30/22 1432     Visit Number 4    Number of Visits 12    Date for PT Re-Evaluation 05/29/22    Authorization Type [employer] Medicare    PT Start Time 1432    PT Stop Time 1511    PT Time Calculation (min) 39 min    Activity Tolerance Patient tolerated treatment well    Behavior During Therapy [facility] for tasks assessed/performed             Past Medical History:  Diagnosis Date   Allergy    Asthma    GERD (gastroesophageal reflux disease)    Headache(784.0)    Hypertension    Past Surgical History:  Procedure Laterality Date   HERNIA REPAIR  1969, 2009   kidney stones  05/20/95   KNEE SURGERY  02/1989   NASAL SINUS SURGERY  10/24/1992   RIGHT/LEFT HEART CATH AND CORONARY ANGIOGRAPHY N/A 11/15/2021   Procedure: RIGHT/LEFT HEART CATH AND CORONARY ANGIOGRAPHY;  Surgeon: Orbie Pyo, MD;  Location: [facility] INVASIVE CV [facility];  Service: Cardiovascular;  Laterality: N/A;   ROTATOR CUFF REPAIR  04/25/93   TEE WITHOUT CARDIOVERSION N/A 08/24/2021   Procedure: TRANSESOPHAGEAL ECHOCARDIOGRAM (TEE);  Surgeon: Jodelle Red, MD;  Location: [facility] ENDOSCOPY;  Service: Cardiovascular;  Laterality: N/A;   Patient Active Problem List   Diagnosis Date Noted   Nonrheumatic mitral valve regurgitation s/p repair 2023    Peroneal tendinitis of right lower extremity 04/25/2021   Benign localized prostatic hyperplasia with lower urinary tract symptoms (LUTS) 09/27/2020   Wheezing 09/14/2020   Chondrocalcinosis 03/14/2020   Dyslipidemia 09/23/2017   Bilateral primary osteoarthritis of knee 09/02/2017   Neck pain 02/15/2017   Primary osteoarthritis of right hip 02/15/2017   Tear of right acetabular labrum 02/15/2017   Right leg pain 01/04/2017   Obese 08/17/2015   Elevated blood sugar 08/17/2015   Shingles 09/10/2014    Dyspnea 11/27/2011   HERNIA, UMBILICAL 08/02/2008   Essential hypertension 01/30/2008   Allergic rhinitis 01/30/2008   GERD 01/30/2008   HEADACHE 01/30/2008    PCP: Jacquiline Doe  REFERRING PROVIDER: Clementeen Graham   REFERRING DIAG: Bil knee pain/ OA  THERAPY DIAG:  Chronic pain of right knee  Chronic pain of left knee  Pain in joint of right knee  Pain in joint of left knee  Muscle weakness (generalized)  Difficulty walking  Rationale for Evaluation and Treatment Rehabilitation  ONSET DATE:   SUBJECTIVE:   SUBJECTIVE STATEMENT:  States that he is has soreness on the front and back of the knee. States that he has been looking at shoes today so he has been walking a bit.    Eval: Had recent mitral valve repair March 2023.   Has not had cardiac rehab, may schedule in future.  Has had bil knee pain for while, but increased pain since surgery.  Bil knee pain , L> R  Difficulty walking due to pain.  Candidate for gel shots.  Increased pain with walking, Stairs, hills. Not currently doing HEP.    PERTINENT HISTORY: mitral valve repair March 2023, previous heart cath, OA  PAIN:  Are you having pain? Yes: NPRS scale: 3/10 Pain location: L knee  Pain description: ache, sore Aggravating factors: Increased standing, walking, stairs,  Relieving factors: none stated   Are you having pain?  Yes: NPRS scale: 3/10 Pain location: R knee Pain description: ache, sore Aggravating factors: Increased standing, walking, stairs, Relieving factors: none stated   PRECAUTIONS: None  WEIGHT BEARING RESTRICTIONS No  FALLS:  Has patient fallen in last 6 months? No  PLOF: Independent  PATIENT GOALS   Decreased pain, walk better.    OBJECTIVE:   DIAGNOSTIC FINDINGS:  R: IMPRESSION: 1. Severe medial compartment joint space narrowing is worsened from prior, with now new lateral subluxation of the proximal tibia with respect of the distal femur. 2. Moderate patellofemoral  osteoarthritis  L: IMPRESSION: Severe medial compartment, moderate to severe patellofemoral compartment, and moderate lateral compartment osteoarthritis   COGNITION:  Overall cognitive status: Within functional limits for tasks assessed     SENSATION: WFL   POSTURE: Varus knees bilaterally    LOWER EXTREMITY ROM:  Active ROM Right eval Left eval  Hip flexion    Hip extension    Hip abduction    Hip adduction    Hip internal rotation    Hip external rotation    Knee flexion 125 120  Knee extension 0 -4  Ankle dorsiflexion    Ankle plantarflexion    Ankle inversion    Ankle eversion     (Blank rows = not tested)  LOWER EXTREMITY MMT:  MMT Right eval Left eval  Hip flexion 4 4  Hip extension    Hip abduction 4 4  Hip adduction    Hip internal rotation    Hip external rotation    Knee flexion 4+ 4+  Knee extension 4+ 4  Ankle dorsiflexion    Ankle plantarflexion    Ankle inversion    Ankle eversion     (Blank rows = not tested)   GAIT: Distance walked: 100 Assistive device utilized: None Level of assistance: Complete Independence Comments: Increased lateral sway, decreased knee flexion with swing, bilaterally. Varus knees.    TODAY'S TREATMENT:  04/30/2022 Therapeutic Exercise: Aerobic:  Supine: ball knee flexion 3 minutes, hamstring iso on ball - cramping in left leg stopped, SLR with slow descent and pauses 4x5 B  Seated: LAQs x25 5" holds B, hamstring stretch x3 30" holds B, self mobilization to B LE with tiger tail Standing: Stretches: Seated HSS 30 sec x 3 bil;  Neuromuscular Re-education: Manual Therapy:    PATIENT EDUCATION:  Education details: eviewed HEP.  Person educated: Patient and Spouse Education method: Explanation, Demonstration, Tactile cues, Verbal cues, and Handouts Education comprehension: verbalized understanding, returned demonstration, verbal cues required, tactile cues required, and needs further education   HOME  Exercise : Access Code: [email or web address]   ASSESSMENT:  CLINICAL IMPRESSION: 04/30/2022 Focused on mobility and strengthening. Increased pain on right with SLR and cramping on the left with hamstring isometric. Stopped exercise and transitioned to self mobilization with tiger tail. Tolerated well and reduced tension noted end of session. Will continue with current POC as tolerated.     OBJECTIVE IMPAIRMENTS Abnormal gait, cardiopulmonary status limiting activity, decreased activity tolerance, decreased endurance, decreased knowledge of use of DME, decreased mobility, difficulty walking, decreased ROM, decreased strength, increased muscle spasms, impaired flexibility, improper body mechanics, and pain.   ACTIVITY LIMITATIONS carrying, lifting, bending, standing, squatting, stairs, transfers, and locomotion level  PARTICIPATION LIMITATIONS: cleaning, driving, community activity, and yard work  PERSONAL FACTORS Time since onset of injury/illness/exacerbation are also affecting patient's functional outcome.   REHAB POTENTIAL: Fair severe OA   CLINICAL DECISION MAKING: Stable/uncomplicated  EVALUATION COMPLEXITY: Low   GOALS: Goals reviewed with  patient? Yes  SHORT TERM GOALS:  STG Name Target Date Goal status  1 Patient will be independent with initial HEP   05/01/22  INITIAL     INITIAL         LONG TERM GOALS:   LTG Name Target Date Goal status  1 Patient will be independent with final HEP  05/29/22 INITIAL  2 Pt to report decreased pain in  to 0-3/10 in bil knees with activity   05/29/22 INITIAL  3 Pt to demonstrate improved strength of  bil knees and hips to be at least 4+/5, to improve ability for gait and stairs   05/29/22 INITIAL        4 Pt to demo ability for stair navigation, reciprocal pattern, with 1 UE support, to improve ability for home and community navigation.   05/29/22 INITIAL      PLAN: PT FREQUENCY: 2x/week  PT DURATION: 6 weeks  PLANNED INTERVENTIONS:  Therapeutic exercises, Therapeutic activity, Neuromuscular re-education, Balance training, Gait training, Patient/Family education, Joint manipulation, Joint mobilization, DME instructions, Dry Needling, Electrical stimulation, Spinal manipulation, Spinal mobilization, Cryotherapy, Moist heat, Taping, Vasopneumatic device, Traction, Ultrasound, Ionotophoresis 4mg /ml Dexamethasone, and Manual therapy  PLAN FOR NEXT SESSION:   3:40 PM, 04/30/22 07/01/22, DPT Physical Therapy with [facility]

## 2022-04-30 NOTE — Telephone Encounter (Signed)
Spoke with patient and his wife [name].  Per [name]: Other options are Tylenol or following back up with Sports Medicine Dr  Patient states he has tried Tylenol, has tried Ibuprofen, both with no relief for knee pain along with Voltaren gel/tablets.  Patient is about to start physical therapy for his knee and will begin cardiac rehab at then end of the month. Patient states he will follow-up with Dr. Denyse Amass for further pain management.  Advised patient we try to avoid NSAIDs due to its effects on blood pressure.   Patient verbalized understanding and expressed appreciation for return call.

## 2022-04-30 NOTE — Telephone Encounter (Signed)
Patient can try Voltaren gel.  Is available prescription and OTC

## 2022-04-30 NOTE — Telephone Encounter (Signed)
Returned call to patient's wife [name].  [name] reports patient has knee pain due to arthritis and he has tried diclofenac (Voltaren) 75mg  EC BID. Patient states this medication has not helped with his pain.   states they were advised by patient's sports medicine physician, Dr. [name], to reach out to our office to see what kind of pain medication for arthritis would be recommended that is safe for cardiac patient.  Will forward to Pharm D to advise on recommendations.

## 2022-04-30 NOTE — Telephone Encounter (Signed)
Other options are Tylenol or following back up with Sports Medicine Dr

## 2022-04-30 NOTE — Telephone Encounter (Signed)
Shared recommendation from Pharmacist, [name]: Patient can try Voltaren gel.  Is available prescription and OTC  Patient states he has tried this already and it also does not help with pain management for his knee. Requesting something other than Voltaren as he has tried both gel and tablet form with no relief.

## 2022-04-30 NOTE — Telephone Encounter (Signed)
   Pt c/o medication issue:  1. Name of Medication: pain meds   2. How are you currently taking this medication (dosage and times per day)?   3. Are you having a reaction (difficulty breathing--STAT)?   4. What is your medication issue? Pt's wife calling asking if Dr. Lynnette Caffey can prescribed something for pt for knee pain due to bad arthritis

## 2022-05-07 ENCOUNTER — Ambulatory Visit (INDEPENDENT_AMBULATORY_CARE_PROVIDER_SITE_OTHER): Payer: Medicare Other | Admitting: Physical Therapy

## 2022-05-07 ENCOUNTER — Encounter: Payer: Self-pay | Admitting: Physical Therapy

## 2022-05-07 DIAGNOSIS — M25562 Pain in left knee: Secondary | ICD-10-CM

## 2022-05-07 DIAGNOSIS — M25561 Pain in right knee: Secondary | ICD-10-CM

## 2022-05-07 DIAGNOSIS — G8929 Other chronic pain: Secondary | ICD-10-CM | POA: Diagnosis not present

## 2022-05-07 NOTE — Therapy (Signed)
OUTPATIENT PHYSICAL THERAPY LOWER EXTREMITY TREATMENT   Patient Name: Frank [name] MRN: [id number] DOB:10-19-1951, 71 y.o., male Today's Date: 05/07/2022   PT End of Session - 05/07/22 0815     Visit Number 5    Number of Visits 12    Date for PT Re-Evaluation 05/29/22    Authorization Type [employer] Medicare    PT Start Time 0806    PT Stop Time 0845    PT Time Calculation (min) 39 min    Activity Tolerance Patient tolerated treatment well    Behavior During Therapy [facility] for tasks assessed/performed              Past Medical History:  Diagnosis Date   Allergy    Asthma    GERD (gastroesophageal reflux disease)    Headache(784.0)    Hypertension    Past Surgical History:  Procedure Laterality Date   HERNIA REPAIR  1969, 2009   kidney stones  05/20/95   KNEE SURGERY  02/1989   NASAL SINUS SURGERY  10/24/1992   RIGHT/LEFT HEART CATH AND CORONARY ANGIOGRAPHY N/A 11/15/2021   Procedure: RIGHT/LEFT HEART CATH AND CORONARY ANGIOGRAPHY;  Surgeon: Orbie Pyo, MD;  Location: [facility] INVASIVE CV LAB;  Service: Cardiovascular;  Laterality: N/A;   ROTATOR CUFF REPAIR  04/25/93   TEE WITHOUT CARDIOVERSION N/A 08/24/2021   Procedure: TRANSESOPHAGEAL ECHOCARDIOGRAM (TEE);  Surgeon: Jodelle Red, MD;  Location: [facility] ENDOSCOPY;  Service: Cardiovascular;  Laterality: N/A;   Patient Active Problem List   Diagnosis Date Noted   Nonrheumatic mitral valve regurgitation s/p repair 2023    Peroneal tendinitis of right lower extremity 04/25/2021   Benign localized prostatic hyperplasia with lower urinary tract symptoms (LUTS) 09/27/2020   Wheezing 09/14/2020   Chondrocalcinosis 03/14/2020   Dyslipidemia 09/23/2017   Bilateral primary osteoarthritis of knee 09/02/2017   Neck pain 02/15/2017   Primary osteoarthritis of right hip 02/15/2017   Tear of right acetabular labrum 02/15/2017   Right leg pain 01/04/2017   Obese 08/17/2015   Elevated blood sugar 08/17/2015   Shingles  09/10/2014   Dyspnea 11/27/2011   HERNIA, UMBILICAL 08/02/2008   Essential hypertension 01/30/2008   Allergic rhinitis 01/30/2008   GERD 01/30/2008   HEADACHE 01/30/2008    PCP: Jacquiline Doe  REFERRING PROVIDER: Clementeen Graham   REFERRING DIAG: Bil knee pain/ OA  THERAPY DIAG:  Chronic pain of right knee  Chronic pain of left knee  Rationale for Evaluation and Treatment Rehabilitation  ONSET DATE:   SUBJECTIVE:   SUBJECTIVE STATEMENT:  States that he continues to have soreness daily. He notes minimal change in pain, but does feel he is moving a bit better. He will start cardiac rehab at end of July. .   Eval: Had recent mitral valve repair March 2023.   Has not had cardiac rehab, may schedule in future.  Has had bil knee pain for while, but increased pain since surgery.  Bil knee pain , L> R  Difficulty walking due to pain.  Candidate for gel shots.  Increased pain with walking, Stairs, hills. Not currently doing HEP.    PERTINENT HISTORY: mitral valve repair March 2023, previous heart cath, OA  PAIN:  Are you having pain? Yes: NPRS scale: 3/10 Pain location: L knee  Pain description: ache, sore Aggravating factors: Increased standing, walking, stairs,  Relieving factors: none stated   Are you having pain? Yes: NPRS scale: 3/10 Pain location: R knee Pain description: ache, sore Aggravating factors: Increased standing, walking, stairs,  Relieving factors: none stated   PRECAUTIONS: None  WEIGHT BEARING RESTRICTIONS No  FALLS:  Has patient fallen in last 6 months? No  PLOF: Independent  PATIENT GOALS   Decreased pain, walk better.    OBJECTIVE:   DIAGNOSTIC FINDINGS:  R: IMPRESSION: 1. Severe medial compartment joint space narrowing is worsened from prior, with now new lateral subluxation of the proximal tibia with respect of the distal femur. 2. Moderate patellofemoral osteoarthritis  L: IMPRESSION: Severe medial compartment, moderate to severe  patellofemoral compartment, and moderate lateral compartment osteoarthritis   COGNITION:  Overall cognitive status: Within functional limits for tasks assessed     SENSATION: WFL   POSTURE: Varus knees bilaterally    LOWER EXTREMITY ROM:  Active ROM Right eval Left eval  Hip flexion    Hip extension    Hip abduction    Hip adduction    Hip internal rotation    Hip external rotation    Knee flexion 125 120  Knee extension 0 -4  Ankle dorsiflexion    Ankle plantarflexion    Ankle inversion    Ankle eversion     (Blank rows = not tested)  LOWER EXTREMITY MMT:  MMT Right eval Left eval  Hip flexion 4 4  Hip extension    Hip abduction 4 4  Hip adduction    Hip internal rotation    Hip external rotation    Knee flexion 4+ 4+  Knee extension 4+ 4  Ankle dorsiflexion    Ankle plantarflexion    Ankle inversion    Ankle eversion     (Blank rows = not tested)   GAIT: Distance walked: 100 Assistive device utilized: None Level of assistance: Complete Independence Comments: Increased lateral sway, decreased knee flexion with swing, bilaterally. Varus knees.    TODAY'S TREATMENT:  05/07/2022 Therapeutic Exercise: Aerobic:  Supine: heel slides x 10 bil (discussed doing 1st thing in AM for stiffness);  SLR x 15 bil;  Seated: LAQs x25 5" holds B 3 bl, hamstring stretch x3 30" holds B,   HSC GTB x 20 bil;  Standing: HSC x 10 bil;  Marching x 10 il;  HR x 15;   Ambulation: 45 ft x 6, education and cueing on more upright posture, increased knee flexion with swing, and decreasing lateral trunk sway.  Stretches: Seated HSS 30 sec x 3 bil;  Neuromuscular Re-education: Manual Therapy:    PATIENT EDUCATION:  Education details: eviewed HEP.  Person educated: Patient and Spouse Education method: Explanation, Demonstration, Tactile cues, Verbal cues, and Handouts Education comprehension: verbalized understanding, returned demonstration, verbal cues required, tactile  cues required, and needs further education   HOME Exercise : Access Code: [email or web address]   ASSESSMENT:  CLINICAL IMPRESSION: 05/07/2022 Pt with difficulty with upright posture with ambulation, continues to use a lot of lateral trunk sway, to clear limb with swing. He has pain and instability in knees, and feels unstable when trying to use more knee flexion with swing. Continued strengthening as tolerated today, and encouraged frequent HEP for mobility and strengthening at home.    OBJECTIVE IMPAIRMENTS Abnormal gait, cardiopulmonary status limiting activity, decreased activity tolerance, decreased endurance, decreased knowledge of use of DME, decreased mobility, difficulty walking, decreased ROM, decreased strength, increased muscle spasms, impaired flexibility, improper body mechanics, and pain.   ACTIVITY LIMITATIONS carrying, lifting, bending, standing, squatting, stairs, transfers, and locomotion level  PARTICIPATION LIMITATIONS: cleaning, driving, community activity, and yard work  PERSONAL FACTORS Time since onset of injury/illness/exacerbation are also  affecting patient's functional outcome.   REHAB POTENTIAL: Fair severe OA   CLINICAL DECISION MAKING: Stable/uncomplicated  EVALUATION COMPLEXITY: Low   GOALS: Goals reviewed with patient? Yes  SHORT TERM GOALS:  STG Name Target Date Goal status  1 Patient will be independent with initial HEP   05/01/22  INITIAL     INITIAL         LONG TERM GOALS:   LTG Name Target Date Goal status  1 Patient will be independent with final HEP  05/29/22 INITIAL  2 Pt to report decreased pain in  to 0-3/10 in bil knees with activity   05/29/22 INITIAL  3 Pt to demonstrate improved strength of  bil knees and hips to be at least 4+/5, to improve ability for gait and stairs   05/29/22 INITIAL        4 Pt to demo ability for stair navigation, reciprocal pattern, with 1 UE support, to improve ability for home and community navigation.   05/29/22  INITIAL      PLAN: PT FREQUENCY: 2x/week  PT DURATION: 6 weeks  PLANNED INTERVENTIONS: Therapeutic exercises, Therapeutic activity, Neuromuscular re-education, Balance training, Gait training, Patient/Family education, Joint manipulation, Joint mobilization, DME instructions, Dry Needling, Electrical stimulation, Spinal manipulation, Spinal mobilization, Cryotherapy, Moist heat, Taping, Vasopneumatic device, Traction, Ultrasound, Ionotophoresis 4mg /ml Dexamethasone, and Manual therapy  PLAN FOR NEXT SESSION:   , PT, DPT 10:01 AM  05/07/22

## 2022-05-09 ENCOUNTER — Encounter: Payer: Self-pay | Admitting: Physical Therapy

## 2022-05-09 ENCOUNTER — Ambulatory Visit (INDEPENDENT_AMBULATORY_CARE_PROVIDER_SITE_OTHER): Payer: Medicare Other | Admitting: Physical Therapy

## 2022-05-09 DIAGNOSIS — G8929 Other chronic pain: Secondary | ICD-10-CM | POA: Diagnosis not present

## 2022-05-09 DIAGNOSIS — M25561 Pain in right knee: Secondary | ICD-10-CM | POA: Diagnosis not present

## 2022-05-09 DIAGNOSIS — M25562 Pain in left knee: Secondary | ICD-10-CM | POA: Diagnosis not present

## 2022-05-09 NOTE — Therapy (Signed)
OUTPATIENT PHYSICAL THERAPY LOWER EXTREMITY TREATMENT   Patient Name: Frank [name] MRN: [id number] DOB:[birth year], 71 y.o., male Today's Date: 05/09/2022   PT End of Session - 05/09/22 0804     Visit Number 6    Number of Visits 12    Date for PT Re-Evaluation 05/29/22    Authorization Type [employer] Medicare    PT Start Time 0805    PT Stop Time 0845    PT Time Calculation (min) 40 min    Activity Tolerance Patient tolerated treatment well    Behavior During Therapy [facility] for tasks assessed/performed              Past Medical History:  Diagnosis Date   Allergy    Asthma    GERD (gastroesophageal reflux disease)    Headache(784.0)    Hypertension    Past Surgical History:  Procedure Laterality Date   HERNIA REPAIR  1969, 2009   kidney stones  05/20/95   KNEE SURGERY  02/1989   NASAL SINUS SURGERY  10/24/1992   RIGHT/LEFT HEART CATH AND CORONARY ANGIOGRAPHY N/A 11/15/2021   Procedure: RIGHT/LEFT HEART CATH AND CORONARY ANGIOGRAPHY;  Surgeon: Orbie Pyo, MD;  Location: [facility];  Service: Cardiovascular;  Laterality: N/A;   ROTATOR CUFF REPAIR  04/25/93   TEE WITHOUT CARDIOVERSION N/A 08/24/2021   Procedure: TRANSESOPHAGEAL ECHOCARDIOGRAM (TEE);  Surgeon: Jodelle Red, MD;  Location: [facility] ENDOSCOPY;  Service: Cardiovascular;  Laterality: N/A;   Patient Active Problem List   Diagnosis Date Noted   Nonrheumatic mitral valve regurgitation s/p repair 2023    Peroneal tendinitis of right lower extremity 04/25/2021   Benign localized prostatic hyperplasia with lower urinary tract symptoms (LUTS) 09/27/2020   Wheezing 09/14/2020   Chondrocalcinosis 03/14/2020   Dyslipidemia 09/23/2017   Bilateral primary osteoarthritis of knee 09/02/2017   Neck pain 02/15/2017   Primary osteoarthritis of right hip 02/15/2017   Tear of right acetabular labrum 02/15/2017   Right leg pain 01/04/2017   Obese 08/17/2015   Elevated blood sugar 08/17/2015   Shingles  09/10/2014   Dyspnea 11/27/2011   HERNIA, UMBILICAL 08/02/2008   Essential hypertension 01/30/2008   Allergic rhinitis 01/30/2008   GERD 01/30/2008   HEADACHE 01/30/2008    PCP: Jacquiline Doe  REFERRING PROVIDER: Clementeen Graham   REFERRING DIAG: Bil knee pain/ OA  THERAPY DIAG:  Chronic pain of right knee  Chronic pain of left knee  Rationale for Evaluation and Treatment Rehabilitation  ONSET DATE:   SUBJECTIVE:   SUBJECTIVE STATEMENT:  05/09/22: States that he continues to have soreness. Stiff today. Still having muscle spasms in L Quad.   Eval: Had recent mitral valve repair March 2023.   Has not had cardiac rehab, may schedule in future.  Has had bil knee pain for while, but increased pain since surgery.  Bil knee pain , L> R  Difficulty walking due to pain.  Candidate for gel shots.  Increased pain with walking, Stairs, hills. Not currently doing HEP.    PERTINENT HISTORY: mitral valve repair March 2023, previous heart cath, OA  PAIN:  Are you having pain? Yes: NPRS scale: 3/10 Pain location: L knee  Pain description: ache, sore Aggravating factors: Increased standing, walking, stairs,  Relieving factors: none stated   Are you having pain? Yes: NPRS scale: 3/10 Pain location: R knee Pain description: ache, sore Aggravating factors: Increased standing, walking, stairs, Relieving factors: none stated   PRECAUTIONS: None  WEIGHT BEARING RESTRICTIONS No  FALLS:  Has patient fallen in last 6 months? No  PLOF: Independent  PATIENT GOALS   Decreased pain, walk better.    OBJECTIVE:   DIAGNOSTIC FINDINGS:  R: IMPRESSION: 1. Severe medial compartment joint space narrowing is worsened from prior, with now new lateral subluxation of the proximal tibia with respect of the distal femur. 2. Moderate patellofemoral osteoarthritis  L: IMPRESSION: Severe medial compartment, moderate to severe patellofemoral compartment, and moderate lateral compartment  osteoarthritis   COGNITION:  Overall cognitive status: Within functional limits for tasks assessed     SENSATION: WFL   POSTURE: Varus knees bilaterally    LOWER EXTREMITY ROM:  Active ROM Right eval Left eval  Hip flexion    Hip extension    Hip abduction    Hip adduction    Hip internal rotation    Hip external rotation    Knee flexion 125 120  Knee extension 0 -4  Ankle dorsiflexion    Ankle plantarflexion    Ankle inversion    Ankle eversion     (Blank rows = not tested)  LOWER EXTREMITY MMT:  MMT Right eval Left eval  Hip flexion 4 4  Hip extension    Hip abduction 4 4  Hip adduction    Hip internal rotation    Hip external rotation    Knee flexion 4+ 4+  Knee extension 4+ 4  Ankle dorsiflexion    Ankle plantarflexion    Ankle inversion    Ankle eversion     (Blank rows = not tested)   GAIT: Distance walked: 100 Assistive device utilized: None Level of assistance: Complete Independence Comments: Increased lateral sway, decreased knee flexion with swing, bilaterally. Varus knees.    TODAY'S TREATMENT:  05/09/2022 Therapeutic Exercise: Aerobic:  Supine: heel slides x 15 bil ;  SLR x 15 bil;  Seated: LAQs x25 5" holds B 3 bl,  hamstring stretch x3 30" holds B,    Standing:  Stretches: Seated HSS 30 sec x 3 bil;  Neuromuscular Re-education: Manual Therapy: Roller and STM to L proximal quad and lateral quad. Manual prone quad stretch Trigger Point Dry-Needling  Treatment instructions: Expect mild to moderate muscle soreness. S/S of pneumothorax if dry needled over a lung field, and to seek immediate medical attention should they occur. Patient verbalized understanding of these instructions and education.  Patient Consent Given: Yes Education handout provided: Yes Muscles treated: L Rec fem, vastus lateralis Electrical stimulation performed: No Parameters: N/A Treatment response/outcome: Twitch response, palpable increase in muscle length.      PATIENT EDUCATION:  Education details: reviewed HEP and home MFR.  Person educated: Patient and Spouse Education method: Explanation, Demonstration, Tactile cues, Verbal cues, and Handouts Education comprehension: verbalized understanding, returned demonstration, verbal cues required, tactile cues required, and needs further education   HOME Exercise : Access Code: [email or web address]   ASSESSMENT:  CLINICAL IMPRESSION: 05/09/2022 Pt with continued muscle spasms. Manual tissue release and dry needling done today, in attempts to calm muscle spasms. Pt with good tolerance. Updated and reviewed HEP today. Pt going away next week, and then has f/u with MD, and starting cardiac rehab. He will return for 1 more visit. Discussed importance of continuing HEP for knee pain/strength.    OBJECTIVE IMPAIRMENTS Abnormal gait, cardiopulmonary status limiting activity, decreased activity tolerance, decreased endurance, decreased knowledge of use of DME, decreased mobility, difficulty walking, decreased ROM, decreased strength, increased muscle spasms, impaired flexibility, improper body mechanics, and pain.   ACTIVITY LIMITATIONS carrying, lifting, bending, standing,  squatting, stairs, transfers, and locomotion level  PARTICIPATION LIMITATIONS: cleaning, driving, community activity, and yard work  PERSONAL FACTORS Time since onset of injury/illness/exacerbation are also affecting patient's functional outcome.   REHAB POTENTIAL: Fair severe OA   CLINICAL DECISION MAKING: Stable/uncomplicated  EVALUATION COMPLEXITY: Low   GOALS: Goals reviewed with patient? Yes  SHORT TERM GOALS:  STG Name Target Date Goal status  1 Patient will be independent with initial HEP   05/01/22  INITIAL     INITIAL         LONG TERM GOALS:   LTG Name Target Date Goal status  1 Patient will be independent with final HEP  05/29/22 INITIAL  2 Pt to report decreased pain in  to 0-3/10 in bil knees with activity    05/29/22 INITIAL  3 Pt to demonstrate improved strength of  bil knees and hips to be at least 4+/5, to improve ability for gait and stairs   05/29/22 INITIAL        4 Pt to demo ability for stair navigation, reciprocal pattern, with 1 UE support, to improve ability for home and community navigation.   05/29/22 INITIAL      PLAN: PT FREQUENCY: 2x/week  PT DURATION: 6 weeks  PLANNED INTERVENTIONS: Therapeutic exercises, Therapeutic activity, Neuromuscular re-education, Balance training, Gait training, Patient/Family education, Joint manipulation, Joint mobilization, DME instructions, Dry Needling, Electrical stimulation, Spinal manipulation, Spinal mobilization, Cryotherapy, Moist heat, Taping, Vasopneumatic device, Traction, Ultrasound, Ionotophoresis 4mg /ml Dexamethasone, and Manual therapy  PLAN FOR NEXT SESSION:   , PT, DPT 1:57 PM  05/09/22

## 2022-05-18 NOTE — Progress Notes (Unsigned)
I, [name], LAT, ATC, am serving as scribe for Dr. Clementeen Graham.  Frank [name] is a 71 y.o. male who presents to [facility] today for f/u of chronic [name] pain, L>R, due to severe knee DJD.  He was last seen by Dr. Denyse Amass and was referred to PT of which he's completed 6 visits.  He was also authorized for L knee gel injections (Durolonae/Gelsyn).  Today, pt reports no improvement in his knee pain. Pt c/o L knee is worse and his is interested in possibly proceeding to gel shots.  Diagnostic testing: R and L knee XR- 04/09/22  Pertinent review of systems: No fevers or chills  Relevant historical information: Heart disease.   Exam:  BP (!) 172/108   Pulse 92   Ht 5\' 2"  (1.575 m)   Wt 190 lb (86.2 kg)   SpO2 98%   BMI 34.75 kg/m  General: Well Developed, well nourished, and in no acute distress.   MSK: Left knee: Moderate effusion.  Genu valgus appearance.  Decreased range of motion. Tender palpation medial joint line. Stable ligamentous exam.  Right knee: Mild effusion genu valgus.  Decreased range of motion.  Stable ligamentous exam.    Lab and Radiology Results  Gelsyn injection left knee 1/3 Procedure: Real-time Ultrasound Guided Injection of left knee superior lateral patellar space Device: Philips Affiniti 50G Images permanently stored and available for review in PACS Verbal informed consent obtained.  Discussed risks and benefits of procedure. Warned about infection, bleeding, damage to structures among others. Patient expresses understanding and agreement Time-out conducted.   Noted no overlying erythema, induration, or other signs of local infection.   Skin prepped in a sterile fashion.   Local anesthesia: Topical Ethyl chloride.   With sterile technique and under real time ultrasound guidance: Gelsyn 2 mL injected into knee joint. Fluid seen entering the joint capsule.   Completed without difficulty   Advised to call if  fevers/chills, erythema, induration, drainage, or persistent bleeding.   Images permanently stored and available for review in the ultrasound unit.  Impression: Technically successful ultrasound guided injection. Lot number:       Assessment and Plan: 71 y.o. male with bilateral knee pain left worse than right thought to be due primarily to DJD exacerbation.  His underlying deconditioning is probably a factor as well.  Plan on hyaluronic acid injections.  When these medications were authorized a month ago the primary problem was really just left knee pain so only the left knee was authorized.  However upon presentation today he has bilateral pain.  We proceeded with the first Gelsyn injection for his left knee today but we will go ahead and authorize his right knee as well and when he comes back next week we will probably proceed with the right knee as well.   PDMP not reviewed this encounter. Orders Placed This Encounter  Procedures   66 LIMITED JOINT SPACE STRUCTURES LOW BILAT(NO LINKED CHARGES)    Order Specific Question:   Reason for Exam (SYMPTOM  OR DIAGNOSIS REQUIRED)    Answer:   bilateral knee pain    Order Specific Question:   Preferred imaging location?    Answer:   [facility]   No orders of the defined types were placed in this encounter.    Discussed warning signs or symptoms. Please see discharge instructions. Patient expresses understanding.   The above documentation has been reviewed and is accurate and  complete Lynne Leader, M.D.

## 2022-05-21 ENCOUNTER — Telehealth (HOSPITAL_COMMUNITY): Payer: Self-pay

## 2022-05-21 ENCOUNTER — Ambulatory Visit (INDEPENDENT_AMBULATORY_CARE_PROVIDER_SITE_OTHER): Payer: Medicare Other | Admitting: Family Medicine

## 2022-05-21 ENCOUNTER — Ambulatory Visit: Payer: Self-pay

## 2022-05-21 VITALS — BP 172/108 | HR 92 | Ht 62.0 in | Wt 190.0 lb

## 2022-05-21 DIAGNOSIS — M25561 Pain in right knee: Secondary | ICD-10-CM

## 2022-05-21 DIAGNOSIS — G8929 Other chronic pain: Secondary | ICD-10-CM

## 2022-05-21 DIAGNOSIS — M25562 Pain in left knee: Secondary | ICD-10-CM | POA: Diagnosis not present

## 2022-05-21 DIAGNOSIS — M170 Bilateral primary osteoarthritis of knee: Secondary | ICD-10-CM | POA: Diagnosis not present

## 2022-05-21 MED ORDER — SODIUM HYALURONATE (VISCOSUP) 16.8 MG/2ML IX SOSY
16.8000 mg | PREFILLED_SYRINGE | Freq: Once | INTRA_ARTICULAR | Status: AC
  Administered 2022-05-21: 16.8 mg via INTRA_ARTICULAR

## 2022-05-21 NOTE — Patient Instructions (Addendum)
Thank you for coming in today.   You received an injection today. Seek immediate medical attention if the joint becomes red, extremely painful, or is oozing fluid.   Schedule the 2nd Gelsyn injection for next week and the 3rd for the week after that.  We will work to get the gel shots approved for your right knee.

## 2022-05-21 NOTE — Addendum Note (Signed)
Addended by: Adron Bene on: 05/21/2022 09:37 AM   Modules accepted: Orders

## 2022-05-24 ENCOUNTER — Encounter: Payer: Self-pay | Admitting: Physical Therapy

## 2022-05-24 ENCOUNTER — Ambulatory Visit (HOSPITAL_COMMUNITY): Payer: Medicare Other | Attending: Physician Assistant

## 2022-05-24 ENCOUNTER — Encounter (HOSPITAL_COMMUNITY): Payer: Self-pay

## 2022-05-24 ENCOUNTER — Ambulatory Visit (INDEPENDENT_AMBULATORY_CARE_PROVIDER_SITE_OTHER): Payer: Medicare Other | Admitting: Physical Therapy

## 2022-05-24 ENCOUNTER — Encounter (HOSPITAL_COMMUNITY)
Admission: RE | Admit: 2022-05-24 | Discharge: 2022-05-24 | Disposition: A | Discharge status: Home / Self Care | Payer: Medicare Other | Source: Ambulatory Visit | Attending: Internal Medicine | Admitting: Internal Medicine

## 2022-05-24 VITALS — BP 122/88 | HR 102 | Ht 62.25 in | Wt 189.2 lb

## 2022-05-24 DIAGNOSIS — M6281 Muscle weakness (generalized): Secondary | ICD-10-CM | POA: Diagnosis not present

## 2022-05-24 DIAGNOSIS — G8929 Other chronic pain: Secondary | ICD-10-CM | POA: Diagnosis not present

## 2022-05-24 DIAGNOSIS — Z9889 Other specified postprocedural states: Secondary | ICD-10-CM | POA: Diagnosis not present

## 2022-05-24 DIAGNOSIS — M25562 Pain in left knee: Secondary | ICD-10-CM | POA: Diagnosis not present

## 2022-05-24 DIAGNOSIS — M25561 Pain in right knee: Secondary | ICD-10-CM | POA: Diagnosis not present

## 2022-05-24 HISTORY — DX: Atherosclerotic heart disease of native coronary artery without angina pectoris: I25.10

## 2022-05-24 NOTE — Progress Notes (Signed)
Cardiac Rehab Medication Review by a Nurse  Does the patient  feel that his/her medications are working for him/her?  yes  Has the patient been experiencing any side effects to the medications prescribed?  no  Does the patient measure his/her own blood pressure or blood glucose at home?  yes   Does the patient have any problems obtaining medications due to transportation or finances?   no  Understanding of regimen: fair Understanding of indications: fair Potential of compliance: good    Nurse  comments: Frank [name] says he is taking his medications as prescribed and has a fair understanding of what his medications are for. Frank [name] says he checks his blood pressures daily sometimes twice a day. Frank [name] says he gets various reading on his BP cuff/ monitor at home. I asked [name] to bring his BP cuff in from home when he begins exercise.    Arta Bruce Risa Auman RN 05/24/2022 8:55 AM

## 2022-05-24 NOTE — Progress Notes (Signed)
Patient her for intensive cardiac rehab orientation. Telemetry rhythm noted at Sinus tach low 100's with frequent PAC's, PVC's. Blood pressure 122/88. Oxygen saturation 97% on room air. Medications reviewed. Frank [name] says his metoprolol was discontinued due to hives and fatigue. Spoke with Frank [name] PAC to update. Order received to obtain 12 lead ECG. Frank [name] said he recommended moving up his office follow up which is not until December. Frank [name] said that Frank [name] is okay to proceed with intensive cardiac rehab. Frank [name] reviewed today's 12 lead ECG which showed Sinus Rhythm with PAC's. Appointment made per Frank [name] for Frank [name] to follow up in the office to follow up with Frank Searing NP.Thayer Headings RN BSN

## 2022-05-24 NOTE — Progress Notes (Signed)
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  Sleep Disorders Clinical staff conducted group or individual video education with verbal and written material and guidebook.  Patient learns how good quality and duration of sleep are important to overall health and well-being. Patient also learns about sleep disorders and how they impact health along with recommendations to address them, including discussing with a physician.  Nutrition  Dining Out - Part 2 Clinical staff conducted group or individual video education with verbal and written material and guidebook.  Patient learns how to plan ahead and communicate in order to maximize their dining experience in a healthy and nutritious manner. Included are recommended food choices based on the type of restaurant the patient  is visiting.   Fueling a Best boy conducted group or individual video education with verbal and written material and guidebook.  There is a strong connection between our food choices and our health. Diseases like obesity and type 2 diabetes are very prevalent and are in large-part due to lifestyle choices. The Pritikin Eating Plan provides plenty of food and hunger-curbing satisfaction. It is easy to follow, affordable, and helps reduce health risks.  Menu Workshop  Clinical staff conducted group or individual video education with verbal and written material and guidebook.  Patient learns that restaurant meals can sabotage health goals because they are often packed with calories, fat, sodium, and sugar. Recommendations include strategies to plan ahead and to communicate with the manager, chef, or server to help order a healthier meal.  Planning Your Eating Strategy  Clinical staff conducted group or individual video education with verbal and written material and guidebook.  Patient learns about the [place] and its benefit of reducing the risk of disease. The [place] does not focus on calories. Instead, it emphasizes high-quality, nutrient-rich foods. By knowing the characteristics of the foods, we choose, we can determine their calorie density and make informed decisions.  Targeting Your Nutrition Priorities  Clinical staff conducted group or individual video education with verbal and written material and guidebook.  Patient learns that lifestyle habits have a tremendous impact on disease risk and progression. This video provides eating and physical activity recommendations based on your personal health goals, such as reducing LDL cholesterol, losing weight, preventing or controlling type 2 diabetes, and reducing high blood pressure.  Vitamins and Minerals  Clinical staff conducted group or individual video education with verbal and written material  and guidebook.  Patient learns different ways to obtain key vitamins and minerals, including through a recommended healthy diet. It is important to discuss all supplements you take with your doctor.   Healthy Mind-Set    Smoking Cessation  Clinical staff conducted group or individual video education with verbal and written material and guidebook.  Patient learns that cigarette smoking and tobacco addiction pose a serious health risk which affects millions of people. Stopping smoking will significantly reduce the risk of heart disease, lung disease, and many forms of cancer. Recommended strategies for quitting are covered, including working with your doctor to develop a successful plan.  Culinary   Becoming a Financial trader conducted group or  individual video education with verbal and written material and guidebook.  Patient learns that cooking at home can be healthy, cost-effective, quick, and puts them in control. Keys to cooking healthy recipes will include looking at your recipe, assessing your equipment needs, planning ahead, making it simple, choosing cost-effective seasonal ingredients, and limiting the use of added fats, salts, and sugars.  Cooking - Breakfast and Snacks  Clinical staff conducted group or individual video education with verbal and written material and guidebook.  Patient learns how important breakfast is to satiety and nutrition through the entire day. Recommendations include key foods to eat during breakfast to help stabilize blood sugar levels and to prevent overeating at meals later in the day. Planning ahead is also a key component.  Cooking - Human resources officer conducted group or individual video education with verbal and written material and guidebook.  Patient learns eating strategies to improve overall health, including an approach to cook more at home. Recommendations include thinking of animal protein as a side on your plate rather  than center stage and focusing instead on lower calorie dense options like vegetables, fruits, whole grains, and plant-based proteins, such as beans. Making sauces in large quantities to freeze for later and leaving the skin on your vegetables are also recommended to maximize your experience.  Cooking - Healthy Salads and Dressing Clinical staff conducted group or individual video education with verbal and written material and guidebook.  Patient learns that vegetables, fruits, whole grains, and legumes are the foundations of the [place]. Recommendations include how to incorporate each of these in flavorful and healthy salads, and how to create homemade salad dressings. Proper handling of ingredients is also covered. Cooking - Soups and [employer] - Soups and Desserts Clinical staff conducted group or individual video education with verbal and written material and guidebook.  Patient learns that Pritikin soups and desserts make for easy, nutritious, and delicious snacks and meal components that are low in sodium, fat, sugar, and calorie density, while high in vitamins, minerals, and filling fiber. Recommendations include simple and healthy ideas for soups and desserts.   Overview     The Pritikin Solution Program Overview Clinical staff conducted group or individual video education with verbal and written material and guidebook.  Patient learns that the results of the [place] Program have been documented in more than 100 articles published in peer-reviewed journals, and the benefits include reducing risk factors for (and, in some cases, even reversing) high cholesterol, high blood pressure, type 2 diabetes, obesity, and more! An overview of the three key pillars of the Pritikin Program will be covered: eating well, doing regular exercise, and having a healthy mind-set.  WORKSHOPS  Exercise: Exercise Basics: Building Your Action Plan Clinical staff led group instruction and  group discussion with PowerPoint presentation and patient guidebook. To enhance the learning environment the use of posters, models and videos may be added. At the conclusion of this workshop, patients will comprehend the difference between physical activity and exercise, as well as the benefits of incorporating both, into their routine. Patients will understand the FITT (Frequency, Intensity, Time, and Type) principle and how to use it to build an exercise action plan. In addition, safety concerns and other considerations for exercise and cardiac rehab will be addressed by the presenter. The purpose of this lesson is to promote a comprehensive and effective weekly exercise routine in order to improve patients' overall level of fitness.   Managing Heart Disease:  Your Path to a Healthier Heart Clinical staff led group instruction and group discussion with PowerPoint presentation and patient guidebook. To enhance the learning environment the use of posters, models and videos may be added.At the conclusion of this workshop, patients will understand the anatomy and physiology of the heart. Additionally, they will understand how Pritikin's three pillars impact the risk factors, the progression, and the management of heart disease.  The purpose of this lesson is to provide a high-level overview of the heart, heart disease, and how the Pritikin lifestyle positively impacts risk factors.  Exercise Biomechanics Clinical staff led group instruction and group discussion with PowerPoint presentation and patient guidebook. To enhance the learning environment the use of posters, models and videos may be added. Patients will learn how the structural parts of their bodies function and how these functions impact their daily activities, movement, and exercise. Patients will learn how to promote a neutral spine, learn how to manage pain, and identify ways to improve their physical movement in order to  promote healthy living. The purpose of this lesson is to expose patients to common physical limitations that impact physical activity. Participants will learn practical ways to adapt and manage aches and pains, and to minimize their effect on regular exercise. Patients will learn how to maintain good posture while sitting, walking, and lifting.  Balance Training and Fall Prevention  Clinical staff led group instruction and group discussion with PowerPoint presentation and patient guidebook. To enhance the learning environment the use of posters, models and videos may be added. At the conclusion of this workshop, patients will understand the importance of their sensorimotor skills (vision, proprioception, and the vestibular system) in maintaining their ability to balance as they age. Patients will apply a variety of balancing exercises that are appropriate for their current level of function. Patients will understand the common causes for poor balance, possible solutions to these problems, and ways to modify their physical environment in order to minimize their fall risk. The purpose of this lesson is to teach patients about the importance of maintaining balance as they age and ways to minimize their risk of falling.  WORKSHOPS   Nutrition:  Fueling a Scientist, research (physical sciences) led group instruction and group discussion with PowerPoint presentation and patient guidebook. To enhance the learning environment the use of posters, models and videos may be added. Patients will review the foundational principles of the [place] and understand what constitutes a serving size in each of the food groups. Patients will also learn Pritikin-friendly foods that are better choices when away from home and review make-ahead meal and snack options. Calorie density will be reviewed and applied to three nutrition priorities: weight maintenance, weight loss, and weight gain. The purpose of this lesson is to  reinforce (in a group setting) the key concepts around what patients are recommended to eat and how to apply these guidelines when away from home by planning and selecting Pritikin-friendly options. Patients will understand how calorie density may be adjusted for different weight management goals.  Mindful Eating  Clinical staff led group instruction and group discussion with PowerPoint presentation and patient guidebook. To enhance the learning environment the use of posters, models and videos may be added. Patients will briefly review the concepts of the [place] and the importance of low-calorie dense foods. The concept of mindful eating will be introduced as well as the importance of paying attention to internal hunger signals. Triggers for non-hunger eating and techniques for dealing  with triggers will be explored. The purpose of this lesson is to provide patients with the opportunity to review the basic principles of the [place], discuss the value of eating mindfully and how to measure internal cues of hunger and fullness using the Hunger Scale. Patients will also discuss reasons for non-hunger eating and learn strategies to use for controlling emotional eating.  Targeting Your Nutrition Priorities Clinical staff led group instruction and group discussion with PowerPoint presentation and patient guidebook. To enhance the learning environment the use of posters, models and videos may be added. Patients will learn how to determine their genetic susceptibility to disease by reviewing their family history. Patients will gain insight into the importance of diet as part of an overall healthy lifestyle in mitigating the impact of genetics and other environmental insults. The purpose of this lesson is to provide patients with the opportunity to assess their personal nutrition priorities by looking at their family history, their own health history and current risk factors. Patients will  also be able to discuss ways of prioritizing and modifying the [place] for their highest risk areas  Menu  Clinical staff led group instruction and group discussion with PowerPoint presentation and patient guidebook. To enhance the learning environment the use of posters, models and videos may be added. Using menus brought in from [employer], or printed from [employer], patients will apply the [place] dining out guidelines that were presented in the [employer] video. Patients will also be able to practice these guidelines in a variety of provided scenarios. The purpose of this lesson is to provide patients with the opportunity to practice hands-on learning of the [place] with actual menus and practice scenarios.  Label Reading Clinical staff led group instruction and group discussion with PowerPoint presentation and patient guidebook. To enhance the learning environment the use of posters, models and videos may be added. Patients will review and discuss the Pritikin label reading guidelines presented in Pritikin's Label Reading Educational series video. Using fool labels brought in from local grocery stores and markets, patients will apply the label reading guidelines and determine if the packaged food meet the Pritikin guidelines. The purpose of this lesson is to provide patients with the opportunity to review, discuss, and practice hands-on learning of the Pritikin Label Reading guidelines with actual packaged food labels. [place] Workshops are designed to teach patients ways to prepare quick, simple, and affordable recipes at home. The importance of nutrition's role in chronic disease risk reduction is reflected in its emphasis in the overall Pritikin program. By learning how to prepare essential core Pritikin Eating Plan recipes, patients will increase control over what they eat; be able to customize the  flavor of foods without the use of added salt, sugar, or fat; and improve the quality of the food they consume. By learning a set of core recipes which are easily assembled, quickly prepared, and affordable, patients are more likely to prepare more healthy foods at home. These workshops focus on convenient breakfasts, simple entres, side dishes, and desserts which can be prepared with minimal effort and are consistent with nutrition recommendations for cardiovascular risk reduction. Cooking [employer] are taught by a Engineer, materials (RD) who has been trained by the [employer]. The chef or RD has a clear understanding of the importance of minimizing - if not completely eliminating - added fat, sugar, and sodium in recipes. Throughout the  series of Cooking School Workshop sessions, patients will learn about healthy ingredients and efficient methods of cooking to build confidence in their capability to prepare    Cooking School weekly topics:  Adding Flavor- Sodium-Free  Fast and Healthy Breakfasts  Powerhouse Plant-Based Proteins  Satisfying Salads and Dressings  Simple Sides and Sauces  International Cuisine-Spotlight on the [place] Zones  Delicious Desserts  Savory Soups  [employer] - Meals in a Snap  Tasty Appetizers and Snacks  Comforting Weekend Breakfasts  One-Pot Wonders   Fast Evening Meals  Easy [place] (Psychosocial): New Thoughts, New Behaviors Clinical staff led group instruction and group discussion with PowerPoint presentation and patient guidebook. To enhance the learning environment the use of posters, models and videos may be added. Patients will learn and practice techniques for developing effective health and lifestyle goals. Patients will be able to effectively apply the goal setting process learned to develop at least one new personal goal.  The purpose of this  lesson is to expose patients to a new skill set of behavior modification techniques such as techniques setting SMART goals, overcoming barriers, and achieving new thoughts and new behaviors.  Managing Moods and Relationships Clinical staff led group instruction and group discussion with PowerPoint presentation and patient guidebook. To enhance the learning environment the use of posters, models and videos may be added. Patients will learn how emotional and chronic stress factors can impact their health and relationships. They will learn healthy ways to manage their moods and utilize positive coping mechanisms. In addition, ICR patients will learn ways to improve communication skills. The purpose of this lesson is to expose patients to ways of understanding how one's mood and health are intimately connected. Developing a healthy outlook can help build positive relationships and connections with others. Patients will understand the importance of utilizing effective communication skills that include actively listening and being heard. They will learn and understand the importance of the "4 Cs" and especially Connections in fostering of a Healthy Mind-Set.  Healthy Sleep for a Healthy Heart Clinical staff led group instruction and group discussion with PowerPoint presentation and patient guidebook. To enhance the learning environment the use of posters, models and videos may be added. At the conclusion of this workshop, patients will be able to demonstrate knowledge of the importance of sleep to overall health, well-being, and quality of life. They will understand the symptoms of, and treatments for, common sleep disorders. Patients will also be able to identify daytime and nighttime behaviors which impact sleep, and they will be able to apply these tools to help manage sleep-related challenges. The purpose of this lesson is to provide patients with a general overview of sleep and outline the importance of quality  sleep. Patients will learn about a few of the most common sleep disorders. Patients will also be introduced to the concept of "sleep hygiene," and discover ways to self-manage certain sleeping problems through simple daily behavior changes. Finally, the workshop will motivate patients by clarifying the links between quality sleep and their goals of heart-healthy living.   Recognizing and Reducing Stress Clinical staff led group instruction and group discussion with PowerPoint presentation and patient guidebook. To enhance the learning environment the use of posters, models and videos may be added. At the conclusion of this workshop, patients will be able to understand the types of stress reactions, differentiate between acute and chronic stress, and recognize the impact that chronic stress has  on their health. They will also be able to apply different coping mechanisms, such as reframing negative self-talk. Patients will have the opportunity to practice a variety of stress management techniques, such as deep abdominal breathing, progressive muscle relaxation, and/or guided imagery.  The purpose of this lesson is to educate patients on the role of stress in their lives and to provide healthy techniques for coping with it.  Learning Barriers/Preferences:  Learning Barriers/Preferences - 05/24/22 0944       Learning Barriers/Preferences   Learning Barriers Hearing    Learning Preferences Computer/Internet;Group Instruction;Individual Instruction;Pictoral;Skilled Demonstration;Written Material;Video             Education Topics:  Knowledge Questionnaire Score:  Knowledge Questionnaire Score - 05/24/22 0944       Knowledge Questionnaire Score   Pre Score 22/24             Core Components/Risk Factors/Patient Goals at Admission:  Personal Goals and Risk Factors at Admission - 05/24/22 0945       Core Components/Risk Factors/Patient Goals on Admission    Weight Management Yes;Weight  Loss    Intervention Weight Management: Develop a combined nutrition and exercise program designed to reach desired caloric intake, while maintaining appropriate intake of nutrient and fiber, sodium and fats, and appropriate energy expenditure required for the weight goal.;Weight Management: Provide education and appropriate resources to help participant work on and attain dietary goals.;Weight Management/Obesity: Establish reasonable short term and long term weight goals.;Obesity: Provide education and appropriate resources to help participant work on and attain dietary goals.    Admit Weight 189 lb (85.7 kg)    Goal Weight: Long Term 169 lb (76.7 kg)    Expected Outcomes Short Term: Continue to assess and modify interventions until short term weight is achieved;Long Term: Adherence to nutrition and physical activity/exercise program aimed toward attainment of established weight goal;Weight Loss: Understanding of general recommendations for a balanced deficit meal plan, which promotes 1-2 lb weight loss per week and includes a negative energy balance of [phone] kcal/d;Understanding recommendations for meals to include 15-35% energy as protein, 25-35% energy from fat, 35-60% energy from carbohydrates, less than 200mg  of dietary cholesterol, 20-35 gm of total fiber daily;Understanding of distribution of calorie intake throughout the day with the consumption of 4-5 meals/snacks    Hypertension Yes    Intervention Provide education on lifestyle modifcations including regular physical activity/exercise, weight management, moderate sodium restriction and increased consumption of fresh fruit, vegetables, and low fat dairy, alcohol moderation, and smoking cessation.;Monitor prescription use compliance.    Expected Outcomes Short Term: Continued assessment and intervention until BP is < 140/14mm HG in hypertensive participants. < 130/8mm HG in hypertensive participants with diabetes, heart failure or chronic kidney  disease.;Long Term: Maintenance of blood pressure at goal levels.    Lipids Yes    Intervention Provide education and support for participant on nutrition & aerobic/resistive exercise along with prescribed medications to achieve LDL 70mg , HDL >40mg .    Expected Outcomes Short Term: Participant states understanding of desired cholesterol values and is compliant with medications prescribed. Participant is following exercise prescription and nutrition guidelines.;Long Term: Cholesterol controlled with medications as prescribed, with individualized exercise RX and with personalized nutrition plan. Value goals: LDL < 70mg , HDL > 40 mg.             Core Components/Risk Factors/Patient Goals Review:    Core Components/Risk Factors/Patient Goals at Discharge (Final Review):    ITP Comments:  ITP Comments  [place] Name 05/24/22 1220           ITP Comments Dr Fransico Him MD, Medical Director, Introduction to Pritikin Education Program/ Intensive Cardiac Rehab. Initial PritikinOrientation Packet reviewed with the patient.and his wife.                Comments:Participant attended orientation for the cardiac rehabilitation program on  05/24/2022  to perform initial intake and exercise walk test. Patient introduced to the [place] education and orientation packet was reviewed. Completed 6-minute walk test, measurements, initial ITP, and exercise prescription. Vital signs stable. Telemetry sinus rhythm,sinus tach with a bundle branch block, frequent PAC's and PVC's please see previous documentation as Hal Meng PAC was notified. 12 lead ECG obtained asymptomatic. Charlie did have chronic bilateral knee pain. Charlie used a rollator to complete his 6 minute walk test as Eduard Clos uses a cane at home.Harrell Gave RN BSN   Service time was from [phone] to 1022.

## 2022-05-24 NOTE — Therapy (Signed)
OUTPATIENT PHYSICAL THERAPY LOWER EXTREMITY TREATMENT/Discharge   Patient Name: Frank [name] MRN: [id number] DOB:24-May-1951, 71 y.o., male Today's Date: 05/24/2022   PT End of Session - 05/24/22 1351     Visit Number 7    Number of Visits 12    Date for PT Re-Evaluation 05/29/22    Authorization Type [employer] Medicare    PT Start Time 1350    PT Stop Time 1430    PT Time Calculation (min) 40 min    Activity Tolerance Patient tolerated treatment well    Behavior During Therapy [facility] for tasks assessed/performed              Past Medical History:  Diagnosis Date   Allergy    Asthma    Coronary artery disease    GERD (gastroesophageal reflux disease)    Headache(784.0)    Hypertension    Past Surgical History:  Procedure Laterality Date   [place], 2009   kidney stones  05/20/1995   KNEE SURGERY  02/26/1989   NASAL SINUS SURGERY  10/24/1992   RIGHT/LEFT HEART CATH AND CORONARY ANGIOGRAPHY N/A 11/15/2021   Procedure: RIGHT/LEFT HEART CATH AND CORONARY ANGIOGRAPHY;  Surgeon: [name], MD;  Location: [facility];  Service: Cardiovascular;  Laterality: N/A;   ROTATOR CUFF REPAIR  04/25/1993   TEE WITHOUT CARDIOVERSION N/A 08/24/2021   Procedure: TRANSESOPHAGEAL ECHOCARDIOGRAM (TEE);  Surgeon: Buford Dresser, MD;  Location: [facility];  Service: Cardiovascular;  Laterality: N/A;   Patient Active Problem List   Diagnosis Date Noted   Nonrheumatic mitral valve regurgitation s/p repair 2023    Peroneal tendinitis of right lower extremity 04/25/2021   Benign localized prostatic hyperplasia with lower urinary tract symptoms (LUTS) 09/27/2020   Wheezing 09/14/2020   Chondrocalcinosis 03/14/2020   Dyslipidemia 09/23/2017   Bilateral primary osteoarthritis of knee 09/02/2017   Neck pain 02/15/2017   Primary osteoarthritis of right hip 02/15/2017   Tear of right acetabular labrum 02/15/2017   Right leg pain  01/04/2017   Obese 08/17/2015   Elevated blood sugar 08/17/2015   Shingles 09/10/2014   Dyspnea 14/48/1856   HERNIA, UMBILICAL 31/49/7026   Essential hypertension 01/30/2008   Allergic rhinitis 01/30/2008   GERD 01/30/2008   HEADACHE 01/30/2008    PCP: Dimas Chyle  REFERRING PROVIDER: Lynne Leader   REFERRING DIAG: Bil knee pain/ OA  THERAPY DIAG:  Chronic pain of right knee  Chronic pain of left knee  Pain in joint of right knee  Pain in joint of left knee  Muscle weakness (generalized)  Rationale for Evaluation and Treatment Rehabilitation  ONSET DATE:   SUBJECTIVE:   SUBJECTIVE STATEMENT:  05/24/22: States that he continues to have soreness. Had 1st gel shot this week.  Is starting cardiac rehab next week. Significant improvement of muscle spasms in L quad after dry needling.   Eval: Had recent mitral valve repair March 2023.   Has not had cardiac rehab, may schedule in future.  Has had bil knee pain for while, but increased pain since surgery.  Bil knee pain , L> R  Difficulty walking due to pain.  Candidate for gel shots.  Increased pain with walking, Stairs, hills. Not currently doing HEP.    PERTINENT HISTORY: mitral valve repair March 2023, previous heart cath, OA  PAIN:  Are you having pain? Yes: NPRS scale: 3/10 Pain location: L knee  Pain description: ache, sore Aggravating factors: Increased standing, walking, stairs,  Relieving factors: none stated   Are you having pain? Yes: NPRS scale: 3/10 Pain location: R knee Pain description: ache, sore Aggravating factors: Increased standing, walking, stairs, Relieving factors: none stated   PRECAUTIONS: None  WEIGHT BEARING RESTRICTIONS No  FALLS:  Has patient fallen in last 6 months? No  PLOF: Independent  PATIENT GOALS   Decreased pain, walk better.    OBJECTIVE:   DIAGNOSTIC FINDINGS:  R: IMPRESSION: 1. Severe medial compartment joint space narrowing is worsened from prior, with  now new lateral subluxation of the proximal tibia with respect of the distal femur. 2. Moderate patellofemoral osteoarthritis  L: IMPRESSION: Severe medial compartment, moderate to severe patellofemoral compartment, and moderate lateral compartment osteoarthritis   COGNITION:  Overall cognitive status: Within functional limits for tasks assessed     SENSATION: WFL   POSTURE: Varus knees bilaterally    LOWER EXTREMITY ROM:  Active ROM Right eval Left eval  Hip flexion    Hip extension    Hip abduction    Hip adduction    Hip internal rotation    Hip external rotation    Knee flexion 125 120  Knee extension 0 -4  Ankle dorsiflexion    Ankle plantarflexion    Ankle inversion    Ankle eversion     (Blank rows = not tested)  LOWER EXTREMITY MMT:  MMT Right 7/27 Left 7/27  Hip flexion 4+ 4+  Hip extension    Hip abduction 4+ 4+  Hip adduction    Hip internal rotation    Hip external rotation    Knee flexion 5 5  Knee extension 5 5  Ankle dorsiflexion    Ankle plantarflexion    Ankle inversion    Ankle eversion     (Blank rows = not tested)    TODAY'S TREATMENT:  05/24/2022 Therapeutic Exercise: Aerobic:  Supine: heel slides x 15 bil ;  SLR x 15 bil;  Seated: LAQs x20 bil;  hamstring stretch x3 30" holds B,    Standing:  Stretches: Seated HSS 30 sec x 3 bil;    Gait: 45 ft x8, with education and cueing for optimal use and sequencing of SPC.  Neuromuscular Re-education: Manual Therapy: Roller and STM to L proximal quad and lateral quad. Manual prone quad stretch   PATIENT EDUCATION:  Education details: reviewed HEP Person educated: Patient and Spouse Education method: Explanation, Demonstration, Tactile cues, Verbal cues, and Handouts Education comprehension: verbalized understanding, returned demonstration, verbal cues required, tactile cues required, and needs further education   HOME Exercise : Access Code:  [email or web address]   ASSESSMENT:  CLINICAL IMPRESSION: 05/24/2022 Pt with continued pain in knees. He does have improved muscle spasms of thigh after dry needling. He will continue to f/u with MD for remaining gel shots. He will also continue HEP for strength, and will be attending cardiac rehab. Improved use of SPC today after education and practice. Pt has not had significant pain relief with PT. Reviewed HEP today, pt ready for d/c to HEP, pt in agreement with plan.     OBJECTIVE IMPAIRMENTS Abnormal gait, cardiopulmonary status limiting activity, decreased activity tolerance, decreased endurance, decreased knowledge of use of DME, decreased mobility, difficulty walking, decreased ROM, decreased strength, increased muscle spasms, impaired flexibility, improper body mechanics, and pain.   ACTIVITY LIMITATIONS carrying, lifting, bending, standing, squatting, stairs, transfers, and locomotion level  PARTICIPATION LIMITATIONS: cleaning, driving, community activity, and yard work  PERSONAL FACTORS Time since onset of injury/illness/exacerbation are also affecting patient's functional  outcome.   REHAB POTENTIAL: Fair severe OA   CLINICAL DECISION MAKING: Stable/uncomplicated  EVALUATION COMPLEXITY: Low   GOALS: Goals reviewed with patient? Yes  SHORT TERM GOALS:  STG Name Target Date Goal status  1 Patient will be independent with initial HEP   05/01/22  MET     MET         LONG TERM GOALS:   LTG Name Target Date Goal status  1 Patient will be independent with final HEP  05/29/22 MET  2 Pt to report decreased pain in  to 0-3/10 in bil knees with activity   05/29/22 Ongoing   3 Pt to demonstrate improved strength of  bil knees and hips to be at least 4+/5, to improve ability for gait and stairs   05/29/22 MET        4 Pt to demo ability for stair navigation, reciprocal pattern, with 1 UE support, to improve ability for home and community navigation.   05/29/22 MET      PLAN: PT  FREQUENCY: 2x/week  PT DURATION: 6 weeks  PLANNED INTERVENTIONS: Therapeutic exercises, Therapeutic activity, Neuromuscular re-education, Balance training, Gait training, Patient/Family education, Joint manipulation, Joint mobilization, DME instructions, Dry Needling, Electrical stimulation, Spinal manipulation, Spinal mobilization, Cryotherapy, Moist heat, Taping, Vasopneumatic device, Traction, Ultrasound, Ionotophoresis 4mg /ml Dexamethasone, and Manual therapy  PLAN FOR NEXT SESSION:   Lyndee Hensen, PT, DPT 1:53 PM  05/24/22  PHYSICAL THERAPY DISCHARGE SUMMARY  Visits from Start of Care: 7   Plan: Patient agrees to discharge.  Patient goals were partially met. Patient is being discharged due to meeting the stated rehab goals. Still having pain in knees, will have gel shots.    Lyndee Hensen, PT, DPT 2:30 PM  05/24/22

## 2022-05-28 ENCOUNTER — Encounter (HOSPITAL_COMMUNITY): Payer: Self-pay

## 2022-05-28 ENCOUNTER — Encounter (HOSPITAL_COMMUNITY)
Admission: RE | Admit: 2022-05-28 | Discharge: 2022-05-28 | Disposition: A | Discharge status: Home / Self Care | Payer: Medicare Other | Source: Ambulatory Visit | Attending: Internal Medicine | Admitting: Internal Medicine

## 2022-05-28 DIAGNOSIS — Z9889 Other specified postprocedural states: Secondary | ICD-10-CM

## 2022-05-28 NOTE — Progress Notes (Signed)
Daily Session Note  Patient Details  Name: Frank [name] MRN: [id number] Date of Birth: 09-23-1951 Referring Provider:   Flowsheet Row INTENSIVE CARDIAC REHAB ORIENT from 05/24/2022 in [place]  Referring Provider Dr [name], MD       Encounter Date: 05/28/2022  Check In:  Session Check In - 05/28/22 0908       Check-In   Supervising physician immediately available to respond to emergencies [facility] immediately available    Physician(s) Dr. Posey Pronto    Location [facility]    Staff Present Barnet Pall, RN, Milus Glazier, MS, ACSM-CEP, CCRP, Exercise Physiologist;Other;Jetta Gilford Rile BS, ACSM EP-C, Exercise Physiologist;Olinty Celesta Aver, MS, ACSM CEP, Exercise Physiologist   Albertine Grates, RN   Virtual Visit No    Medication changes reported     No    Fall or balance concerns reported    No    Tobacco Cessation No Change    Warm-up and Cool-down Performed as group-led instruction    Resistance Training Performed Yes    VAD Patient? No    PAD/SET Patient? No      Pain Assessment   Currently in Pain? No/denies    Pain Score 0-No pain    Multiple Pain Sites No             Capillary Blood Glucose: No results found for this or any previous visit (from the past 24 hour(s)).   Exercise Prescription Changes - 05/28/22 1500       Response to Exercise   Blood Pressure (Admit) 132/72    Blood Pressure (Exercise) 118/70    Blood Pressure (Exit) 118/80    Heart Rate (Admit) 98 bpm    Heart Rate (Exercise) 107 bpm    Heart Rate (Exit) 92 bpm    Rating of Perceived Exertion (Exercise) 11    Symptoms None    Comments Pt's first day in the [facility]2 program    Duration Continue with 30 min of aerobic exercise without signs/symptoms of physical distress.    Intensity THRR unchanged      Progression   Progression Continue to progress workloads to maintain intensity without signs/symptoms of physical distress.     Average METs 2.2      Resistance Training   Training Prescription Yes    Weight 3 lbs    Reps 10-15    Time 10 Minutes      Interval Training   Interval Training No      NuStep   Level 2    Minutes 15    METs 2.2             Social History   Tobacco Use  Smoking Status Former  Smokeless Tobacco Never  Tobacco Comments   quit in 1972    Goals Met:  Exercise tolerated well No report of concerns or symptoms today Strength training completed today  Goals Unmet:  Not Applicable  Comments: Frank [name] started cardiac rehab today.  Pt tolerated light exercise without difficulty. VSS, telemetry-Sinus rhythm with, asymptomatic.  Medication list reconciled. Pt denies barriers to medicaiton compliance.  PSYCHOSOCIAL ASSESSMENT:  PHQ-0. Pt exhibits positive coping skills, hopeful outlook with supportive family. No psychosocial needs identified at this time, no psychosocial interventions necessary.    Pt enjoys antiques, fishing, Software engineer.   Pt oriented to exercise equipment and routine.    Understanding verbalized. Barnet Pall, RN,BSN 05/28/2022 4:55 PM    Dr. Fransico Him is Medical Director  for Cardiac Rehab at [facility].

## 2022-05-29 ENCOUNTER — Ambulatory Visit (INDEPENDENT_AMBULATORY_CARE_PROVIDER_SITE_OTHER): Payer: Medicare Other | Admitting: Family Medicine

## 2022-05-29 ENCOUNTER — Ambulatory Visit: Payer: Self-pay

## 2022-05-29 DIAGNOSIS — G8929 Other chronic pain: Secondary | ICD-10-CM

## 2022-05-29 DIAGNOSIS — M25561 Pain in right knee: Secondary | ICD-10-CM | POA: Diagnosis not present

## 2022-05-29 DIAGNOSIS — M170 Bilateral primary osteoarthritis of knee: Secondary | ICD-10-CM | POA: Diagnosis not present

## 2022-05-29 DIAGNOSIS — M25562 Pain in left knee: Secondary | ICD-10-CM

## 2022-05-29 MED ORDER — SODIUM HYALURONATE (VISCOSUP) 16.8 MG/2ML IX SOSY
16.8000 mg | PREFILLED_SYRINGE | Freq: Once | INTRA_ARTICULAR | Status: AC
  Administered 2022-05-29: 16.8 mg via INTRA_ARTICULAR

## 2022-05-29 MED ORDER — MELOXICAM 15 MG PO TABS: 15.0000 mg | ORAL_TABLET | Freq: Every day | ORAL | 1 refills | Status: DC

## 2022-05-29 MED ORDER — SODIUM HYALURONATE (VISCOSUP) 16.8 MG/2ML IX SOSY
16.8000 mg | PREFILLED_SYRINGE | Freq: Once | INTRA_ARTICULAR | Status: AC
Start: 2022-05-29 — End: 2022-05-29
  Administered 2022-05-29: 16.8 mg via INTRA_ARTICULAR

## 2022-05-29 NOTE — Patient Instructions (Addendum)
Thank you for coming in today.   You received an injection today. Seek immediate medical attention if the joint becomes red, extremely painful, or is oozing fluid.   See you next week for the 3rd gel shot in your left knee and the 2nd gel shot in your right knee.

## 2022-05-29 NOTE — Progress Notes (Signed)
Frank [name] is a 71 y.o. male who presents to [facility] at [facility] today for knee pain.  Patient was seen last week for his initial Gelsyn injection of his left knee.  He notes also his right knee is painful and is interested in starting a Gelsyn series for his right knee today if possible.  Also he is interested in talking about medication management for his knee pain.  He currently takes acetaminophen and occasional diclofenac oral.  He is used topical diclofenac gel which has not been very helpful.  He also notes that in the past he is use tramadol occasionally which does help some too.   Pertinent review of systems: No fevers or chills  Relevant historical information: Starting cardiac rehab now.   Exam:  Heart rate 80 General: Well Developed, well nourished, and in no acute distress.   MSK: Right knee genu valgus appearance mild knee effusion normal motion with crepitation.  Left knee: Genu valgus appearance moderate knee effusion slight decreased range of motion to extension and flexion.  Crepitation with range of motion    Lab and Radiology Results  Gelsyn injection right knee 1/3 Procedure: Real-time Ultrasound Guided Injection of right knee superior lateral patellar space Device: Philips Affiniti 50G Images permanently stored and available for review in PACS Verbal informed consent obtained.  Discussed risks and benefits of procedure. Warned about infection, bleeding, damage to structures among others. Patient expresses understanding and agreement Time-out conducted.   Noted no overlying erythema, induration, or other signs of local infection.   Skin prepped in a sterile fashion.   Local anesthesia: Topical Ethyl chloride.   With sterile technique and under real time ultrasound guidance: Gelsyn 2 mL injected into the knee joint. Fluid seen entering the joint capsule.   Completed without difficulty   Advised to call if fevers/chills,  erythema, induration, drainage, or persistent bleeding.   Images permanently stored and available for review in the ultrasound unit.  Impression: Technically successful ultrasound guided injection.  Gelsyn injection left knee 2/3 Procedure: Real-time Ultrasound Guided Injection of left knee superior lateral patellar space Device: Philips Affiniti 50G Images permanently stored and available for review in PACS Verbal informed consent obtained.  Discussed risks and benefits of procedure. Warned about infection, bleeding, damage to structures among others. Patient expresses understanding and agreement Time-out conducted.   Noted no overlying erythema, induration, or other signs of local infection.   Skin prepped in a sterile fashion.   Local anesthesia: Topical Ethyl chloride.   With sterile technique and under real time ultrasound guidance: Gelsyn 2 mL injected into knee joint. Fluid seen entering the joint capsule.   Completed without difficulty   Advised to call if fevers/chills, erythema, induration, drainage, or persistent bleeding.   Images permanently stored and available for review in the ultrasound unit.  Impression: Technically successful ultrasound guided injection.  Lot number: [id number] for both injections   Assessment and Plan: 71 y.o. male with bilateral knee pain due to significant DJD left worse than right.  Patient was seen today for a previously scheduled left knee Gelsyn injection. However we did some medical decision making to proceed with a right knee hyaluronic acid Gelsyn injection today.  Additionally we discussed medication management for his knee and started meloxicam. We also talked about tramadol but held off on prescribing that for now.  We also talked about ultimate long-term planning for his knees which probably will include a knee replacement at some point.  Return in 1 week for Gelsyn injection left knee 3/3 and Gelsyn injection right knee 2/3.   PDMP not  reviewed this encounter. Orders Placed This Encounter  Procedures   [place] LIMITED JOINT SPACE STRUCTURES LOW LEFT(NO LINKED CHARGES)    Order Specific Question:   Reason for Exam (SYMPTOM  OR DIAGNOSIS REQUIRED)    Answer:   left knee pain    Order Specific Question:   Preferred imaging location?    Answer:   Adult nurse [facility] ordered this encounter  Medications   sodium hyaluronate (viscosup) (GELSYN-3) intra-articular injection 16.8 mg   sodium hyaluronate (viscosup) (GELSYN-3) intra-articular injection 16.8 mg   meloxicam (MOBIC) 15 MG tablet    Sig: Take 1 tablet (15 mg total) by mouth daily.    Dispense:  30 tablet    Refill:  1     Discussed warning signs or symptoms. Please see discharge instructions. Patient expresses understanding.   The above documentation has been reviewed and is accurate and complete Clementeen Graham, M.D.

## 2022-05-30 ENCOUNTER — Encounter (HOSPITAL_COMMUNITY)
Admission: RE | Admit: 2022-05-30 | Discharge: 2022-05-30 | Disposition: A | Discharge status: Home / Self Care | Payer: Medicare Other | Source: Ambulatory Visit | Attending: Internal Medicine | Admitting: Internal Medicine

## 2022-05-30 DIAGNOSIS — Z9889 Other specified postprocedural states: Secondary | ICD-10-CM | POA: Insufficient documentation

## 2022-05-30 NOTE — Progress Notes (Signed)
Office Visit    Patient Name: Frank [name] Date of Encounter: 05/30/2022  Primary Care Provider:  Ardith Dark, MD Primary Cardiologist:  Orbie Pyo, MD Primary Electrophysiologist: None  Chief Complaint    Frank [name] is a 71 y.o. 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  Past Medical History    Past Medical History:  Diagnosis Date   Allergy    Asthma    Coronary artery disease    GERD (gastroesophageal reflux disease)    Headache(784.0)    Hypertension    Past Surgical History:  Procedure Laterality Date   CARDIAC CATHETERIZATION     HERNIA REPAIR  1969, 2009   kidney stones  05/20/1995   KNEE SURGERY  02/26/1989   NASAL SINUS SURGERY  10/24/1992   RIGHT/LEFT HEART CATH AND CORONARY ANGIOGRAPHY N/A 11/15/2021   Procedure: RIGHT/LEFT HEART CATH AND CORONARY ANGIOGRAPHY;  Surgeon: Orbie Pyo, MD;  Location: [facility]AS[facility];  Service: Cardiovascular;  Laterality: N/A;   ROTATOR CUFF REPAIR  04/25/1993   TEE WITHOUT CARDIOVERSION N/A 08/24/2021   Procedure: TRANSESOPHAGEAL ECHOCARDIOGRAM (TEE);  Surgeon: Jodelle Red, MD;  Location: [facility] ENDOSCOPY;  Service: Cardiovascular;  Laterality: N/A;    Allergies  Allergies  Allergen Reactions   Atorvastatin Other (See Comments)    Joint pain   Tomato     Mouth breaks out   Metoprolol Rash    Weakness / fatigue     History of Present Illness    Frank [name] is a 71 year old past medical history presents today for 25-month follow-up s/p mitral valve repair.  Mr. Enyeart establish care with Dr. Lynnette Caffey in 07/2021 when he presented for follow-up for ongoing dyspnea.  During visit patient also endorsed increased wheezing when completing exertional activity.  He was started on Lasix 20 mg and cardiac CTA was ordered that revealed calcium score of 75 with normal coronaries.  TEE echo  was also obtained revealing prolapse of MV leaflet with moderate mitral regurgitation.  Following shared decision discussion patient was referred to [facility] for MV repair.  Right/LHC completed 10/2021 that confirmed 3+ mitral regurgitation with mild pulmonary hypertension and mildly elevated LV diastolic pressure with no significant obstructive CAD.  Mr. Cowgill had repair completed 12/2021 with no complications except for bout of SVT that resolved with beta-blocker therapy.  He was last seen by Dr. Lynnette Caffey and was doing well with no significant SOB and mild peripheral edema.  TTE was performed 02/2022 with EF of 50-55% and mild concentric LVH with mild MV regurg and functional mild stenosis.  Since last being seen in the office patient reports that he is not having any chest pain but does report some shortness of breath with activity and increased swelling in his right lower extremity.  He is compliant with his medications and abstains from excess salt in his diet.  He is otherwise euvolemic on examination with exception of bilateral edema.  Patient's blood pressure is well controlled at 112/74 and is currently not taking any antihypertensive medications due to allergic reaction with metoprolol.  He is currently dissipating in cardiac rehab is tolerating activity well.  He denies chest pain, palpitations, dyspnea, PND, orthopnea, nausea, vomiting, dizziness, syncope, edema, weight gain, or early satiety.  Home Medications    Current Outpatient Medications  Medication Sig Dispense Refill   acetaminophen (TYLENOL) 500 MG  tablet Take 1,000 mg by mouth daily.     albuterol (VENTOLIN HFA) 108 (90 Base) MCG/ACT inhaler INHALE 2 PUFFS BY MOUTH EVERY 6 HOURS AS NEEDED FOR WHEEZE OR SHORTNESS OF BREATH 6.7 each 1   ascorbic acid (VITAMIN C) 500 MG tablet Take 500 mg by mouth daily.     aspirin EC 81 MG tablet Take 1 tablet (81 mg total) by mouth daily. Swallow whole. 90 tablet 3   cholecalciferol (VITAMIN  D) 25 MCG (1000 UNIT) tablet Take 1,000 Units by mouth daily.     fluticasone (FLONASE) 50 MCG/ACT nasal spray Place 1 spray into both nostrils daily as needed for allergies or rhinitis.     furosemide (LASIX) 20 MG tablet Take 1 tablet (20 mg total) by mouth 2 (two) times daily. (Patient taking differently: Take 40 mg by mouth 2 (two) times daily.) 180 tablet 3   lisinopril-hydrochlorothiazide (ZESTORETIC) 20-25 MG tablet TAKE 1 TABLET BY MOUTH EVERY DAY 90 tablet 1   meloxicam (MOBIC) 15 MG tablet Take 1 tablet (15 mg total) by mouth daily. 30 tablet 1   Potassium Chloride ER 20 MEQ TBCR Take 10 mEq by mouth daily.     rosuvastatin (CRESTOR) 5 MG tablet TAKE 1 TABLET (5 MG TOTAL) BY MOUTH DAILY. (Patient not taking: Reported on 05/21/2022) 90 tablet 1   tamsulosin (FLOMAX) 0.4 MG CAPS capsule TAKE 1 CAPSULE BY MOUTH EVERY DAY 90 capsule 1   traMADol (ULTRAM) 50 MG tablet Take 1 tablet (50 mg total) by mouth every 8 (eight) hours as needed for severe pain. 15 tablet 0   triamcinolone (KENALOG) 0.1 % paste Apply to affected area 1-2 times daily (Patient taking differently: 1 application  daily as needed (mouth sores).) 5 g 12   vitamin B-12 (CYANOCOBALAMIN) 1000 MCG tablet Take 1,000 mcg by mouth daily.     Zinc 50 MG CAPS Take 50 mg by mouth daily.     No current facility-administered medications for this visit.     Review of Systems  Please see the history of present illness.    (+) Lower extremity swelling (+)   All other systems reviewed and are otherwise negative except as noted above.  Physical Exam    Wt Readings from Last 3 Encounters:  05/24/22 189 lb 2.5 oz (85.8 kg)  05/21/22 190 lb (86.2 kg)  04/09/22 186 lb (84.4 kg)   TK:ZSWFU were no vitals filed for this visit.,There is no height or weight on file to calculate BMI.  Constitutional:      Appearance: Healthy appearance. Not in distress.  Neck:     Vascular: JVD normal.  Pulmonary:     Effort: Pulmonary effort is  normal.     Breath sounds: No wheezing. No rales. Diminished in the bases Cardiovascular:     Normal rate. Regular rhythm. Normal S1. Normal S2.      Murmurs: There is no murmur.  Edema:    2+ pitting in the right lower leg and 1+ left lower leg.  Abdominal:     Palpations: Abdomen is soft non tender. There is no hepatomegaly.  Skin:    General: Skin is warm and dry.  Neurological:     General: No focal deficit present.     Mental Status: Alert and oriented to person, place and time.     Cranial Nerves: Cranial nerves are intact.  EKG/LABS/Other Studies Reviewed    ECG personally reviewed by me today -none completed today  Lab Results  Component Value Date   WBC 8.0 10/31/2021   HGB 11.9 (L) 11/15/2021   HCT 35.0 (L) 11/15/2021   MCV 93 10/31/2021   PLT 251 10/31/2021   Lab Results  Component Value Date   CREATININE 0.88 10/31/2021   BUN 21 10/31/2021   NA 146 (H) 11/15/2021   K 3.5 11/15/2021   CL 102 10/31/2021   CO2 24 10/31/2021   Lab Results  Component Value Date   ALT 11 03/13/2022   AST 15 03/13/2022   ALKPHOS 58 03/13/2022   BILITOT 0.3 03/13/2022   Lab Results  Component Value Date   CHOL 193 03/13/2022   HDL 30 (L) 03/13/2022   LDLCALC 128 (H) 03/13/2022   LDLDIRECT 118.0 08/10/2015   TRIG 196 (H) 03/13/2022   CHOLHDL 6.4 (H) 03/13/2022    Lab Results  Component Value Date   HGBA1C 6.2 06/23/2021    Assessment & Plan    1.  History of severe mitral regurgitation: -s/p MV repair at [facility] 12/2021 with most recent echo and EF of 50-55% and mild concentric LVH with mild MV regurg and functional mild stenosis.  He denies any chest pain or dizziness. -He was encouraged to use his compression stockings and elevate extremities when dependent. -He will increase his Lasix to 60 mg twice daily x3 days and then reduce back to 40 mg twice daily -He will take 20 mEq of potassium with the increased Lasix dosage and then return back to 10 mg daily -He was  advised to follow-up with Dr. Jimmey Ralph regarding possible referral for venous insufficiency if swelling continues with increased Lasix.   2.  Essential hypertension: -BP today was 112/74 well-controlled -He is currently not on any antihypertensive medications  3.  GERD: -Followed by PCP continue current medication regimen  4.  Hyperlipidemia: -Last LDL was 128 still above goal of less than 70 -History of statin intolerance is currently followed by lipid clinic for possible PCSK9 inhibitor he is still deciding on whether to move forward at this time. -I encouraged him to abstain from high fat foods and minimize sweets   Disposition: Follow-up with Orbie Pyo, MD or APP in  3 months   Medication Adjustments/Labs and Tests Ordered: Current medicines are reviewed at length with the patient today.  Concerns regarding medicines are outlined above.   Signed, Napoleon Form, Leodis Rains, NP 05/30/2022, 3:23 PM [facility]

## 2022-06-04 ENCOUNTER — Encounter (HOSPITAL_COMMUNITY)
Admission: RE | Admit: 2022-06-04 | Discharge: 2022-06-04 | Disposition: A | Discharge status: Home / Self Care | Payer: Medicare Other | Source: Ambulatory Visit | Attending: Internal Medicine | Admitting: Internal Medicine

## 2022-06-04 DIAGNOSIS — Z9889 Other specified postprocedural states: Secondary | ICD-10-CM

## 2022-06-05 ENCOUNTER — Ambulatory Visit: Payer: Medicare Other | Admitting: Family Medicine

## 2022-06-05 ENCOUNTER — Encounter: Payer: Self-pay | Admitting: Nurse Practitioner

## 2022-06-05 ENCOUNTER — Ambulatory Visit: Payer: Medicare Other | Admitting: Nurse Practitioner

## 2022-06-05 ENCOUNTER — Ambulatory Visit: Payer: Self-pay

## 2022-06-05 VITALS — BP 112/74 | HR 84 | Ht 62.0 in | Wt 186.6 lb

## 2022-06-05 DIAGNOSIS — I10 Essential (primary) hypertension: Secondary | ICD-10-CM

## 2022-06-05 DIAGNOSIS — G8929 Other chronic pain: Secondary | ICD-10-CM | POA: Diagnosis not present

## 2022-06-05 DIAGNOSIS — M25562 Pain in left knee: Secondary | ICD-10-CM | POA: Diagnosis not present

## 2022-06-05 DIAGNOSIS — M170 Bilateral primary osteoarthritis of knee: Secondary | ICD-10-CM | POA: Diagnosis not present

## 2022-06-05 DIAGNOSIS — M25561 Pain in right knee: Secondary | ICD-10-CM | POA: Diagnosis not present

## 2022-06-05 MED ORDER — SODIUM HYALURONATE (VISCOSUP) 16.8 MG/2ML IX SOSY
33.6000 mg | PREFILLED_SYRINGE | Freq: Once | INTRA_ARTICULAR | Status: AC
Start: 2022-06-05 — End: 2022-06-05
  Administered 2022-06-05: 33.6 mg via INTRA_ARTICULAR

## 2022-06-05 NOTE — Progress Notes (Signed)
Kamarrion presents to clinic today for Gelsyn injection left knee 3/3 and right knee 2/3  Procedure: Real-time Ultrasound Guided Injection of left knee superior lateral patellar space Device: Philips Affiniti 50G Images permanently stored and available for review in PACS Verbal informed consent obtained.  Discussed risks and benefits of procedure. Warned about infection, bleeding, damage to structures among others. Patient expresses understanding and agreement Time-out conducted.   Noted no overlying erythema, induration, or other signs of local infection.   Skin prepped in a sterile fashion.   Local anesthesia: Topical Ethyl chloride.   With sterile technique and under real time ultrasound guidance: Gelsyn 2 mL injected into knee joint. Fluid seen entering the joint capsule.   Completed without difficulty   Advised to call if fevers/chills, erythema, induration, drainage, or persistent bleeding.   Images permanently stored and available for review in the ultrasound unit.  Impression: Technically successful ultrasound guided injection.    Procedure: Real-time Ultrasound Guided Injection of right knee superior lateral patellar space Device: Philips Affiniti 50G Images permanently stored and available for review in PACS Verbal informed consent obtained.  Discussed risks and benefits of procedure. Warned about infection, bleeding, damage to structures among others. Patient expresses understanding and agreement Time-out conducted.   Noted no overlying erythema, induration, or other signs of local infection.   Skin prepped in a sterile fashion.   Local anesthesia: Topical Ethyl chloride.   With sterile technique and under real time ultrasound guidance: Gelsyn 2 mL injected into knee joint. Fluid seen entering the joint capsule.   Completed without difficulty   Advised to call if fevers/chills, erythema, induration, drainage, or persistent bleeding.   Images permanently stored and available  for review in the ultrasound unit.  Impression: Technically successful ultrasound guided injection.    Lot number: [id number] for both injections  Return in 1 week for Gelsyn injection right knee 3/3

## 2022-06-05 NOTE — Patient Instructions (Signed)
Medication Instructions:  Your physician has recommended you make the following change in your medication:   INCREASE the Lasix to 60 mg twice a day for 3 days then reduce back down to 40 mg twice a day  INCREASE the Potassium to 20 meq daily for 3 days then reduce back to 10 meq daily   *If you need a refill on your cardiac medications before your next appointment, please call your pharmacy*   Lab Work: MONDAY, 06/11/22:  COME TO THE OFFICE FOR A :  BMET, ANYTIME AFTER 7:15 - 5:00 If you have labs (blood work) drawn today and your tests are completely normal, you will receive your results only by: MyChart Message (if you have MyChart) OR A paper copy in the mail If you have any lab test that is abnormal or we need to change your treatment, we will call you to review the results.   Testing/Procedures: None ordered   Follow-Up: At [facility], you and your health needs are our priority.  As part of our continuing mission to provide you with exceptional heart care, we have created designated Provider Care Teams.  These Care Teams include your primary Cardiologist (physician) and Advanced Practice Providers (APPs -  Physician Assistants and Nurse Practitioners) who all work together to provide you with the care you need, when you need it.  We recommend signing up for the patient portal called "MyChart".  Sign up information is provided on this After Visit Summary.  MyChart is used to connect with patients for Virtual Visits (Telemedicine).  Patients are able to view lab/test results, encounter notes, upcoming appointments, etc.  Non-urgent messages can be sent to your provider as well.   To learn more about what you can do with MyChart, go to [email or web address].    Your next appointment:   10/05/22 arrive at 11:00   The format for your next appointment:   In Person  Provider:   Orbie Pyo, MD  or Robin Searing, NP         Other Instructions   Important Information About  Sugar

## 2022-06-05 NOTE — Patient Instructions (Addendum)
Thank you for coming in today.   You received an injection today. Seek immediate medical attention if the joint becomes red, extremely painful, or is oozing fluid.   Return in 1 week for injection

## 2022-06-06 ENCOUNTER — Encounter (HOSPITAL_COMMUNITY)
Admission: RE | Admit: 2022-06-06 | Discharge: 2022-06-06 | Disposition: A | Discharge status: Home / Self Care | Payer: Medicare Other | Source: Ambulatory Visit | Attending: Internal Medicine | Admitting: Internal Medicine

## 2022-06-06 DIAGNOSIS — Z9889 Other specified postprocedural states: Secondary | ICD-10-CM

## 2022-06-07 ENCOUNTER — Other Ambulatory Visit: Payer: Self-pay | Admitting: Family Medicine

## 2022-06-11 ENCOUNTER — Other Ambulatory Visit: Payer: Medicare Other

## 2022-06-11 ENCOUNTER — Encounter (HOSPITAL_COMMUNITY)
Admission: RE | Admit: 2022-06-11 | Discharge: 2022-06-11 | Disposition: A | Discharge status: Home / Self Care | Payer: Medicare Other | Source: Ambulatory Visit | Attending: Internal Medicine | Admitting: Internal Medicine

## 2022-06-11 DIAGNOSIS — Z9889 Other specified postprocedural states: Secondary | ICD-10-CM

## 2022-06-11 DIAGNOSIS — I10 Essential (primary) hypertension: Secondary | ICD-10-CM

## 2022-06-11 LAB — BASIC METABOLIC PANEL
BUN/Creatinine Ratio: 22 (ref 10–24)
BUN: 24 mg/dL (ref 8–27)
CO2: 26 mmol/L (ref 20–29)
Calcium: 9.6 mg/dL (ref 8.6–10.2)
Chloride: 101 mmol/L (ref 96–106)
Creatinine, Ser: 1.09 mg/dL (ref 0.76–1.27)
Glucose: 93 mg/dL (ref 70–99)
Potassium: 4.1 mmol/L (ref 3.5–5.2)
Sodium: 139 mmol/L (ref 134–144)
eGFR: 73 mL/min/{1.73_m2} (ref >59)

## 2022-06-12 ENCOUNTER — Ambulatory Visit: Payer: Self-pay

## 2022-06-12 ENCOUNTER — Ambulatory Visit (INDEPENDENT_AMBULATORY_CARE_PROVIDER_SITE_OTHER): Payer: Medicare Other | Admitting: Family Medicine

## 2022-06-12 ENCOUNTER — Telehealth: Payer: Self-pay

## 2022-06-12 DIAGNOSIS — M170 Bilateral primary osteoarthritis of knee: Secondary | ICD-10-CM

## 2022-06-12 DIAGNOSIS — M25561 Pain in right knee: Secondary | ICD-10-CM | POA: Diagnosis not present

## 2022-06-12 DIAGNOSIS — G8929 Other chronic pain: Secondary | ICD-10-CM | POA: Diagnosis not present

## 2022-06-12 MED ORDER — SODIUM HYALURONATE (VISCOSUP) 16.8 MG/2ML IX SOSY
16.8000 mg | PREFILLED_SYRINGE | Freq: Once | INTRA_ARTICULAR | Status: AC
  Administered 2022-06-12: 16.8 mg via INTRA_ARTICULAR

## 2022-06-12 NOTE — Progress Notes (Signed)
Frank [name] presents to clinic today for Gelsyn injection right knee 3/3.  He has completed the [facility] series for his left knee.  He still experiences pain in both knees left worse than right.  Procedure: Real-time Ultrasound Guided Injection of right knee superior lateral patellar space Device: Philips Affiniti 50G Images permanently stored and available for review in PACS Verbal informed consent obtained.  Discussed risks and benefits of procedure. Warned about infection, bleeding, damage to structures among others. Patient expresses understanding and agreement Time-out conducted.   Noted no overlying erythema, induration, or other signs of local infection.   Skin prepped in a sterile fashion.   Local anesthesia: Topical Ethyl chloride.   With sterile technique and under real time ultrasound guidance: Gelsyn 2 mL injected into knee joint. Fluid seen entering the joint capsule.   Completed without difficulty   Advised to call if fevers/chills, erythema, induration, drainage, or persistent bleeding.   Images permanently stored and available for review in the ultrasound unit.  Impression: Technically successful ultrasound guided injection.  Lot number: [id number]  We talked about options.  He still doing cardiac rehab and his cardiologist would like him to be a little more optimized prior to knee replacement.  We will try Zilretta injections before referral to orthopedic surgery if possible.  However he is failing conservative management and I think it is likely that he will require knee replacement before the end of the year or soon thereafter.

## 2022-06-12 NOTE — Telephone Encounter (Signed)
Normal renal and potassium following increase in Lasix.  Continue current Lasix dose as prescribed.  Please continue a Low sodium diet, fluid restriction <2L, and daily weights. Contact our office for weight gain of 2 lbs overnight or 5 lbs in one week.   Per Robin Searing, NP   Pt called and orders, as stated above by Robin Searing, NP, communicated / educated Pt.  Pt seemed to need clarification on the duration of Lasix change.  Orders read directly from Mr. Louanne Skye, NP progress note to clarify instructions for Pt.  This is what was reviewed with Pt: "He will increase his Lasix to 60 mg twice daily x3 days and then reduce back to 40 mg twice daily."  Pt understood results, and education.  Pt also understood parameters to reach back out to [place] with any weight gain concerns. No f/u req at this time.

## 2022-06-12 NOTE — Patient Instructions (Addendum)
Thank you for coming in today.   You received an injection today. Seek immediate medical attention if the joint becomes red, extremely painful, or is oozing fluid.   I've referred you to Orthopedic Surgery for a consultation.  Let us know if you don't hear from them in one week.

## 2022-06-13 ENCOUNTER — Encounter (HOSPITAL_COMMUNITY)
Admission: RE | Admit: 2022-06-13 | Discharge: 2022-06-13 | Disposition: A | Discharge status: Home / Self Care | Payer: Medicare Other | Source: Ambulatory Visit | Attending: Internal Medicine | Admitting: Internal Medicine

## 2022-06-13 DIAGNOSIS — Z9889 Other specified postprocedural states: Secondary | ICD-10-CM

## 2022-06-15 NOTE — Progress Notes (Unsigned)
   I, Philbert Riser, LAT, ATC acting as a scribe for Clementeen Graham, MD.  Frank [name] is a 71 y.o. male who presents to [facility] today for cont'd bilat knee pain. Pt was last seen by Dr. Denyse Amass on 06/12/22 completing the Gelsyn series in bilat knees. Today, pt reports  Dx imaging: 04/09/22 R & L knee XR  Pertinent review of systems: ***  Relevant historical information: ***   Exam:  There were no vitals taken for this visit. General: Well Developed, well nourished, and in no acute distress.   MSK: ***    Lab and Radiology Results No results found for this or any previous visit (from the past 72 hour(s)). No results found.     Assessment and Plan: 71 y.o. male with ***   PDMP not reviewed this encounter. No orders of the defined types were placed in this encounter.  No orders of the defined types were placed in this encounter.    Discussed warning signs or symptoms. Please see discharge instructions. Patient expresses understanding.   ***

## 2022-06-18 ENCOUNTER — Ambulatory Visit: Payer: Self-pay

## 2022-06-18 ENCOUNTER — Ambulatory Visit (INDEPENDENT_AMBULATORY_CARE_PROVIDER_SITE_OTHER): Payer: Medicare Other | Admitting: Family Medicine

## 2022-06-18 ENCOUNTER — Encounter (HOSPITAL_COMMUNITY)
Admission: RE | Admit: 2022-06-18 | Discharge: 2022-06-18 | Disposition: A | Discharge status: Home / Self Care | Payer: Medicare Other | Source: Ambulatory Visit | Attending: Internal Medicine | Admitting: Internal Medicine

## 2022-06-18 VITALS — BP 130/86 | HR 47 | Ht 62.0 in | Wt 180.6 lb

## 2022-06-18 DIAGNOSIS — M25562 Pain in left knee: Secondary | ICD-10-CM

## 2022-06-18 DIAGNOSIS — M170 Bilateral primary osteoarthritis of knee: Secondary | ICD-10-CM | POA: Diagnosis not present

## 2022-06-18 DIAGNOSIS — M25561 Pain in right knee: Secondary | ICD-10-CM

## 2022-06-18 DIAGNOSIS — G8929 Other chronic pain: Secondary | ICD-10-CM | POA: Diagnosis not present

## 2022-06-18 DIAGNOSIS — Z9889 Other specified postprocedural states: Secondary | ICD-10-CM

## 2022-06-18 MED ORDER — TRIAMCINOLONE ACETONIDE 32 MG IX SRER
64.0000 mg | Freq: Once | INTRA_ARTICULAR | Status: AC
  Administered 2022-06-18: 64 mg via INTRA_ARTICULAR

## 2022-06-18 NOTE — Patient Instructions (Signed)
Thank you for coming in today.   You received an injection today. Seek immediate medical attention if the joint becomes red, extremely painful, or is oozing fluid.   Let me know if this doesn't help and I can refer you for a genicular nerve ablation.

## 2022-06-20 ENCOUNTER — Encounter (HOSPITAL_COMMUNITY)
Admission: RE | Admit: 2022-06-20 | Discharge: 2022-06-20 | Disposition: A | Discharge status: Home / Self Care | Payer: Medicare Other | Source: Ambulatory Visit | Attending: Internal Medicine | Admitting: Internal Medicine

## 2022-06-20 DIAGNOSIS — Z9889 Other specified postprocedural states: Secondary | ICD-10-CM

## 2022-06-20 NOTE — Progress Notes (Signed)
Cardiac Individual Treatment Plan  Patient Details  Name: Frank [name] MRN: [id number] Date of Birth: May 31, 1951 Referring Provider:   Flowsheet Row INTENSIVE CARDIAC REHAB ORIENT from 05/24/2022 in [place]  Referring Provider Dr [name], MD       Initial Encounter Date:  [place] from 05/24/2022 in [place]  Date 05/24/22       Visit Diagnosis: 01/02/22 S/P mitral valve repair at [facility] Dr [name]  Patient's Home Medications on Admission:  Current Outpatient Medications:    acetaminophen (TYLENOL) 500 MG tablet, Take 1,000 mg by mouth daily., Disp: , Rfl:    albuterol (VENTOLIN HFA) 108 (90 Base) MCG/ACT inhaler, INHALE 2 PUFFS BY MOUTH EVERY 6 HOURS AS NEEDED FOR WHEEZE OR SHORTNESS OF BREATH, Disp: 8.5 each, Rfl: 1   ascorbic acid (VITAMIN C) 500 MG tablet, Take 500 mg by mouth daily., Disp: , Rfl:    aspirin EC 81 MG tablet, Take 1 tablet (81 mg total) by mouth daily. Swallow whole., Disp: 90 tablet, Rfl: 3   cholecalciferol (VITAMIN D) 25 MCG (1000 UNIT) tablet, Take 1,000 Units by mouth daily., Disp: , Rfl:    fluticasone (FLONASE) 50 MCG/ACT nasal spray, Place 1 spray into both nostrils daily as needed for allergies or rhinitis., Disp: , Rfl:    furosemide (LASIX) 20 MG tablet, Take 1 tablet (20 mg total) by mouth 2 (two) times daily. (Patient taking differently: Take 40 mg by mouth 2 (two) times daily.), Disp: 180 tablet, Rfl: 3   lisinopril-hydrochlorothiazide (ZESTORETIC) 20-25 MG tablet, TAKE 1 TABLET BY MOUTH EVERY DAY, Disp: 90 tablet, Rfl: 1   meloxicam (MOBIC) 15 MG tablet, Take 1 tablet (15 mg total) by mouth daily., Disp: 30 tablet, Rfl: 1   Potassium Chloride ER 20 MEQ TBCR, Take 10 mEq by mouth daily., Disp: , Rfl:    rosuvastatin (CRESTOR) 5 MG tablet, TAKE 1 TABLET (5 MG TOTAL) BY MOUTH DAILY., Disp: 90 tablet, Rfl: 1   tamsulosin (FLOMAX) 0.4 MG CAPS  capsule, TAKE 1 CAPSULE BY MOUTH EVERY DAY, Disp: 90 capsule, Rfl: 1   triamcinolone (KENALOG) 0.1 % paste, Apply to affected area 1-2 times daily (Patient taking differently: 1 application  daily as needed (mouth sores).), Disp: 5 g, Rfl: 12   vitamin B-12 (CYANOCOBALAMIN) 1000 MCG tablet, Take 1,000 mcg by mouth daily., Disp: , Rfl:    Zinc 50 MG CAPS, Take 50 mg by mouth daily., Disp: , Rfl:   Past Medical History: Past Medical History:  Diagnosis Date   Allergy    Asthma    Coronary artery disease    GERD (gastroesophageal reflux disease)    Headache(784.0)    Hypertension     Tobacco Use: Social History   Tobacco Use  Smoking Status Former  Smokeless Tobacco Never  Tobacco Comments   quit in 1972    Labs: Review Flowsheet  More data exists      Latest Ref Rng & Units 06/23/2021 09/04/2021 11/15/2021 11/28/2021 03/13/2022  Labs for ITP Cardiac and Pulmonary Rehab  Cholestrol 100 - 199 mg/dL - 197  - 192  193   LDL (calc) 0 - 99 mg/dL - 139  - 135  128   HDL-C >39 mg/dL - 37  - 34  30   Trlycerides 0 - 149 mg/dL - 113  - 128  196   Hemoglobin A1c 4.6 - 6.5 % 6.2  - - - -  PH, Arterial 7.350 - 7.450 - - 7.354  - -  PCO2 arterial 32.0 - 48.0 mmHg - - 49.5  - -  Bicarbonate 20.0 - 28.0 mmol/L - - 27.4  29.3  27.6  - -  TCO2 22 - 32 mmol/L - - 29  31  29   - -  O2 Saturation % - - 70.0  72.0  94.0  - -    Capillary Blood Glucose: No results found for: "GLUCAP"   Exercise Target Goals: Exercise Program Goal: Individual exercise prescription set using results from initial 6 min walk test and THRR while considering  patient's activity barriers and safety.   Exercise Prescription Goal: Initial exercise prescription builds to 30-45 minutes a day of aerobic activity, 2-3 days per week.  Home exercise guidelines will be given to patient during program as part of exercise prescription that the participant will acknowledge.  Activity Barriers & Risk Stratification:  Activity  Barriers & Cardiac Risk Stratification - 05/24/22 1028       Activity Barriers & Cardiac Risk Stratification   Activity Barriers Joint Problems;Deconditioning;Balance Concerns;Assistive Device;Muscular Weakness;Arthritis    Cardiac Risk Stratification High   6MWT 2.49 METs            6 Minute Walk:  6 Minute Walk     Row Name 05/24/22 1024         6 Minute Walk   Phase Initial     Distance 1047 feet     Walk Time 6 minutes     # of Rest Breaks 0     MPH 1.98     METS 2.49     RPE 9     Perceived Dyspnea  1     VO2 Peak 8.73     Symptoms Yes (comment)     Comments 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 L knee 4/10.     Resting HR 102 bpm     Resting BP 122/88     Resting Oxygen Saturation  97 %     Exercise Oxygen Saturation  during 6 min walk 99 %     Max Ex. HR 126 bpm     Max Ex. BP 154/80     2 Minute Post BP 128/78              Oxygen Initial Assessment:   Oxygen Re-Evaluation:   Oxygen Discharge (Final Oxygen Re-Evaluation):   Initial Exercise Prescription:  Initial Exercise Prescription - 05/24/22 1000       Date of Initial Exercise RX and Referring Provider   Date 05/24/22    Referring Provider Dr [name], MD    Expected Discharge Date 07/27/22      Recumbant Bike   Level 1.5    RPM 40    Minutes 15    METs 2.3      NuStep   Level 2    SPM 70    Minutes 15    METs 2.3      Prescription Details   Frequency (times per week) 2    Duration Progress to 30 minutes of continuous aerobic without signs/symptoms of physical distress      Intensity   THRR 40-80% of Max Heartrate 60-120    Ratings of Perceived Exertion 11-13    Perceived Dyspnea 0-4      Progression   Progression Continue progressive overload as per policy without signs/symptoms or physical distress.      Resistance Training  Training Prescription Yes    Weight 3    Reps 10-15             Perform Capillary Blood Glucose checks as  needed.  Exercise Prescription Changes:   Exercise Prescription Changes     Row Name 05/28/22 1500 06/20/22 1300           Response to Exercise   Blood Pressure (Admit) 132/72 120/80      Blood Pressure (Exercise) 118/70 132/80      Blood Pressure (Exit) 118/80 130/80      Heart Rate (Admit) 98 bpm 93 bpm      Heart Rate (Exercise) 107 bpm 124 bpm      Heart Rate (Exit) 92 bpm 93 bpm      Rating of Perceived Exertion (Exercise) 11 11      Symptoms None None      Comments Pt's first day in the CRP2 program Reviewed METs      Duration Continue with 30 min of aerobic exercise without signs/symptoms of physical distress. Continue with 30 min of aerobic exercise without signs/symptoms of physical distress.      Intensity THRR unchanged THRR unchanged        Progression   Progression Continue to progress workloads to maintain intensity without signs/symptoms of physical distress. Continue to progress workloads to maintain intensity without signs/symptoms of physical distress.      Average METs 2.2 3.5        Resistance Training   Training Prescription Yes No      Weight 3 lbs No weights on Wednesdays      Reps 10-15 --      Time 10 Minutes --        Interval Training   Interval Training No No        Bike   Level -- 1  Air dyne bike      Minutes -- 10      METs -- 3.97        NuStep   Level 2 3      Minutes 15 15      METs 2.2 3               Exercise Comments:   Exercise Comments     Row Name 05/28/22 1531 06/20/22 1402         Exercise Comments Pt's first day in the CRP2 program. Pt had no complaint sna is off to a good start. Reviewed METs. Pt making progress. Go injections in his knees on Monday. Feeling much bettter today. Did not c/o pain. Increased workload on nustep.               Exercise Goals and Review:   Exercise Goals     Row Name 05/24/22 1035             Exercise Goals   Increase Physical Activity Yes       Intervention Provide  advice, education, support and counseling about physical activity/exercise needs.;Develop an individualized exercise prescription for aerobic and resistive training based on initial evaluation findings, risk stratification, comorbidities and participant's personal goals.       Expected Outcomes Short Term: Attend rehab on a regular basis to increase amount of physical activity.;Long Term: Add in home exercise to make exercise part of routine and to increase amount of physical activity.;Long Term: Exercising regularly at least 3-5 days a week.       Increase Strength and Stamina Yes  Intervention Provide advice, education, support and counseling about physical activity/exercise needs.;Develop an individualized exercise prescription for aerobic and resistive training based on initial evaluation findings, risk stratification, comorbidities and participant's personal goals.       Expected Outcomes Short Term: Increase workloads from initial exercise prescription for resistance, speed, and METs.;Short Term: Perform resistance training exercises routinely during rehab and add in resistance training at home;Long Term: Improve cardiorespiratory fitness, muscular endurance and strength as measured by increased METs and functional capacity (6MWT)       Able to understand and use rate of perceived exertion (RPE) scale Yes       Intervention Provide education and explanation on how to use RPE scale       Expected Outcomes Short Term: Able to use RPE daily in rehab to express subjective intensity level;Long Term:  Able to use RPE to guide intensity level when exercising independently       Knowledge and understanding of Target Heart Rate Range (THRR) Yes       Intervention Provide education and explanation of THRR including how the numbers were predicted and where they are located for reference       Expected Outcomes Short Term: Able to state/look up THRR;Short Term: Able to use daily as guideline for intensity  in rehab;Long Term: Able to use THRR to govern intensity when exercising independently       Understanding of Exercise Prescription Yes       Intervention Provide education, explanation, and written materials on patient's individual exercise prescription       Expected Outcomes Short Term: Able to explain program exercise prescription;Long Term: Able to explain home exercise prescription to exercise independently                Exercise Goals Re-Evaluation :  Exercise Goals Re-Evaluation     Row Name 05/28/22 1527             Exercise Goal Re-Evaluation   Exercise Goals Review Increase Physical Activity;Increase Strength and Stamina;Able to understand and use rate of perceived exertion (RPE) scale;Knowledge and understanding of Target Heart Rate Range (THRR);Understanding of Exercise Prescription       Comments Pt's first day in the CRP2 program. Pr understands the Exercise Rx, RPE scale and THRR.       Expected Outcomes Will continue to monitor patient progress ans increase exercise workloads as tolerated.                Discharge Exercise Prescription (Final Exercise Prescription Changes):  Exercise Prescription Changes - 06/20/22 1300       Response to Exercise   Blood Pressure (Admit) 120/80    Blood Pressure (Exercise) 132/80    Blood Pressure (Exit) 130/80    Heart Rate (Admit) 93 bpm    Heart Rate (Exercise) 124 bpm    Heart Rate (Exit) 93 bpm    Rating of Perceived Exertion (Exercise) 11    Symptoms None    Comments Reviewed METs    Duration Continue with 30 min of aerobic exercise without signs/symptoms of physical distress.    Intensity THRR unchanged      Progression   Progression Continue to progress workloads to maintain intensity without signs/symptoms of physical distress.    Average METs 3.5      Resistance Training   Training Prescription No    Weight No weights on Wednesdays      Interval Training   Interval Training No      Bike  Level 1    Air dyne bike   Minutes 10    METs 3.97      NuStep   Level 3    Minutes 15    METs 3             Nutrition:  Target Goals: Understanding of nutrition guidelines, daily intake of sodium 1500mg , cholesterol 200mg , calories 30% from fat and 7% or less from saturated fats, daily to have 5 or more servings of fruits and vegetables.  Biometrics:  Pre Biometrics - 05/24/22 0942       Pre Biometrics   Height 5' 2.25" (1.581 m)    Weight 85.8 kg    Waist Circumference 43.5 inches    Hip Circumference 42.25 inches    Waist to Hip Ratio 1.03 %    BMI (Calculated) 34.33    Triceps Skinfold 17 mm    % Body Fat 33.1 %    Grip Strength 34 kg    Flexibility 13 in    Single Leg Stand 8.5 seconds              Nutrition Therapy Plan and Nutrition Goals:  Nutrition Therapy & Goals - 05/30/22 1119       Nutrition Therapy   Diet Heart Healthy Diet    Drug/Food Interactions Statins/Certain Fruits      Personal Nutrition Goals   Nutrition Goal Patient to choose a daily variety of fruits, vegetables, whole grains, lean protein/plant protein, non fat dairy as part of heart healthy lifestyle.    Personal Goal #2 Patient to decrease sodium intake to 1500mg  per day    Personal Goal #3 Patient to identify and limit daily intake of saturated fat, trans fat, sodium, and refined carbohydrates.    Comments Patient and his wife present for classes. He does report other life stress and knee pain as barrier to lifestyle changes.      Intervention Plan   Intervention Prescribe, educate and counsel regarding individualized specific dietary modifications aiming towards targeted core components such as weight, hypertension, lipid management, diabetes, heart failure and other comorbidities.    Expected Outcomes Short Term Goal: Understand basic principles of dietary content, such as calories, fat, sodium, cholesterol and nutrients.;Long Term Goal: Adherence to prescribed nutrition plan.              Nutrition Assessments:  Nutrition Assessments - 05/30/22 1123       Rate Your Plate Scores   Pre Score 67            MEDIFICTS Score Key: ?70 Need to make dietary changes  40-70 Heart Healthy Diet ? 40 Therapeutic Level Cholesterol Diet   Flowsheet Row INTENSIVE CARDIAC REHAB from 05/30/2022 in [place]  Picture Your Plate Total Score on Admission 67      Picture Your Plate Scores: [id number] Unhealthy dietary pattern with much room for improvement. 41-50 Dietary pattern unlikely to meet recommendations for good health and room for improvement. 51-60 More healthful dietary pattern, with some room for improvement.  >60 Healthy dietary pattern, although there may be some specific behaviors that could be improved.    Nutrition Goals Re-Evaluation:  Nutrition Goals Re-Evaluation     [place] Name 05/30/22 1119             Goals   Current Weight 190 lb 7.6 oz (86.4 kg)       Comment Patient and his wife present for classes. He does report other life  stress and knee pain as barrier to lifestyle changes                Nutrition Goals Re-Evaluation:  Nutrition Goals Re-Evaluation     [place] Name 05/30/22 1119             Goals   Current Weight 190 lb 7.6 oz (86.4 kg)       Comment Patient and his wife present for classes. He does report other life stress and knee pain as barrier to lifestyle changes                Nutrition Goals Discharge (Final Nutrition Goals Re-Evaluation):  Nutrition Goals Re-Evaluation - 05/30/22 1119       Goals   Current Weight 190 lb 7.6 oz (86.4 kg)    Comment Patient and his wife present for classes. He does report other life stress and knee pain as barrier to lifestyle changes             Psychosocial: Target Goals: Acknowledge presence or absence of significant depression and/or stress, maximize coping skills, provide positive support system. Participant is able to verbalize types  and ability to use techniques and skills needed for reducing stress and depression.  Initial Review & Psychosocial Screening:  Initial Psych Review & Screening - 05/24/22 1003       Initial Review   Current issues with None Identified      Family Dynamics   Good Support System? Yes   Frank [name] has his wife, children and grandchildren for support     Barriers   Psychosocial barriers to participate in program The patient should benefit from training in stress management and relaxation.      Screening Interventions   Interventions Encouraged to exercise             Quality of Life Scores:  Quality of Life - 05/24/22 1042       Quality of Life   Select Quality of Life      Quality of Life Scores   Health/Function Pre 25.2 %    Socioeconomic Pre 27.56 %    Psych/Spiritual Pre 29.14 %    Family Pre 24.9 %    GLOBAL Pre 26.49 %            Scores of 19 and below usually indicate a poorer quality of life in these areas.  A difference of  2-3 points is a clinically meaningful difference.  A difference of 2-3 points in the total score of the Quality of Life Index has been associated with significant improvement in overall quality of life, self-image, physical symptoms, and general health in studies assessing change in quality of life.  PHQ-9: Review Flowsheet  More data exists      05/24/2022 02/16/2022 06/23/2021 05/11/2021 09/14/2020  Depression screen PHQ 2/9  Decreased Interest 0 0 0 0 0  Down, Depressed, Hopeless 0 0 0 0 0  PHQ - 2 Score 0 0 0 0 0   Interpretation of Total Score  Total Score Depression Severity:  1-4 = Minimal depression, 5-9 = Mild depression, 10-14 = Moderate depression, 15-19 = Moderately severe depression, 20-27 = Severe depression   Psychosocial Evaluation and Intervention:   Psychosocial Re-Evaluation:  Psychosocial Re-Evaluation     Row Name 05/28/22 0936 06/20/22 [id number]           Psychosocial Re-Evaluation   Current issues with None  Identified None Identified      Interventions Encouraged to attend  Cardiac Rehabilitation for the exercise Encouraged to attend Cardiac Rehabilitation for the exercise      Continue Psychosocial Services  No Follow up required No Follow up required               Psychosocial Discharge (Final Psychosocial Re-Evaluation):  Psychosocial Re-Evaluation - 06/20/22 0828       Psychosocial Re-Evaluation   Current issues with None Identified    Interventions Encouraged to attend Cardiac Rehabilitation for the exercise    Continue Psychosocial Services  No Follow up required             Vocational Rehabilitation: Provide vocational rehab assistance to qualifying candidates.   Vocational Rehab Evaluation & Intervention:  Vocational Rehab - 05/24/22 1004       Initial Vocational Rehab Evaluation & Intervention   Assessment shows need for Vocational Rehabilitation No   Frank [name] is retired and does not need vocational rehab at this time            Education: Education Goals: Education classes will be provided on a weekly basis, covering required topics. Participant will state understanding/return demonstration of topics presented.    Education     Row Name 05/28/22 0900     Education   Cardiac Education Topics Pritikin   Select Workshops     Workshops   Educator Exercise Physiologist   Select Psychosocial   Psychosocial Workshop New Thoughts, New Behaviors   Instruction Review Code 1- Verbalizes Understanding   Class Start Time [phone]   Class Stop Time 0848   Class Time Calculation (min) 36 min    [place] Name 05/30/22 1100     Education   Cardiac Education Topics [employer]   Educator Dietitian   Weekly Topic One-Pot Wonders   Instruction Review Code 1- Verbalizes Understanding   Class Start Time 0815   Class Stop Time 0900   Class Time Calculation (min) 45 min    [place] Name 06/04/22 0900     Education   Cardiac  Education Topics Pritikin   Select Core Videos     Core Videos   Educator Exercise Physiologist   Select Exercise Education   Exercise Education Biomechanial Limitations   Instruction Review Code 1- Verbalizes Understanding   Class Start Time 0810   Class Stop Time [id number]   Class Time Calculation (min) 45 min    [place] Name 06/06/22 1100     Education   Cardiac Education Topics [employer]   Educator Dietitian   Weekly Topic Fast Evening Meals   Instruction Review Code 1- Verbalizes Understanding   Class Start Time 0815   Class Stop Time 0904   Class Time Calculation (min) 49 min    [place] Name 06/11/22 1000     Education   Cardiac Education Topics Pritikin   IT sales professional Nutrition   Nutrition Workshop Fueling a Designer, multimedia   Instruction Review Code 1- Verbalizes Understanding   Class Start Time 0815   Class Stop Time 0904   Class Time Calculation (min) 49 min    [place] Name 06/13/22 0900     Education   Cardiac Education Topics Pritikin   Financial trader   Weekly Topic Adding Flavor - Sodium-Free   Instruction Review Code 1-  Verbalizes Understanding   Class Start Time 0815   Class Stop Time 0857   Class Time Calculation (min) 42 min    [place] Name 06/18/22 0900     Education   Cardiac Education Topics Pritikin   Select Workshops     Workshops   Educator Exercise Physiologist   Select Psychosocial   Psychosocial Workshop Recognizing and Reducing Stress   Instruction Review Code 1- Verbalizes Understanding   Class Start Time 0810   Class Stop Time 0904   Class Time Calculation (min) 54 min    [place] Name 06/20/22 0900     Education   Cardiac Education Topics [employer]   Educator Dietitian   Weekly Topic Fast and Healthy Breakfasts   Instruction Review Code 1- Verbalizes  Understanding   Class Start Time 0815   Class Stop Time 0905   Class Time Calculation (min) 50 min            Core Videos: Exercise    Move It!  Clinical staff conducted group or individual video education with verbal and written material and guidebook.  Patient learns the recommended Pritikin exercise program. Exercise with the goal of living a long, healthy life. Some of the health benefits of exercise include controlled diabetes, healthier blood pressure levels, improved cholesterol levels, improved heart and lung capacity, improved sleep, and better body composition. Everyone should speak with their doctor before starting or changing an exercise routine.  Biomechanical Limitations Clinical staff conducted group or individual video education with verbal and written material and guidebook.  Patient learns how biomechanical limitations can impact exercise and how we can mitigate and possibly overcome limitations to have an impactful and balanced exercise routine.  Body Composition Clinical staff conducted group or individual video education with verbal and written material and guidebook.  Patient learns that body composition (ratio of muscle mass to fat mass) is a key component to assessing overall fitness, rather than body weight alone. Increased fat mass, especially visceral belly fat, can put [place] at increased risk for metabolic syndrome, type 2 diabetes, heart disease, and even death. It is recommended to combine diet and exercise (cardiovascular and resistance training) to improve your body composition. Seek guidance from your physician and exercise physiologist before implementing an exercise routine.  Exercise Action Plan Clinical staff conducted group or individual video education with verbal and written material and guidebook.  Patient learns the recommended strategies to achieve and enjoy long-term exercise adherence, including variety, self-motivation, self-efficacy, and positive  decision making. Benefits of exercise include fitness, good health, weight management, more energy, better sleep, less stress, and overall well-being.  Medical   Heart Disease Risk Reduction Clinical staff conducted group or individual video education with verbal and written material and guidebook.  Patient learns our heart is our most vital organ as it circulates oxygen, nutrients, white blood cells, and hormones throughout the entire body, and carries waste away. Data supports a plant-based eating plan like the Pritikin Program for its effectiveness in slowing progression of and reversing heart disease. The video provides a number of recommendations to address heart disease.   Metabolic Syndrome and Belly Fat  Clinical staff conducted group or individual video education with verbal and written material and guidebook.  Patient learns what metabolic syndrome is, how it leads to heart disease, and how one can reverse it and keep it from coming back. You have metabolic syndrome if you have 3 of the  following 5 criteria: abdominal obesity, high blood pressure, high triglycerides, low HDL cholesterol, and high blood sugar.  Hypertension and Heart Disease Clinical staff conducted group or individual video education with verbal and written material and guidebook.  Patient learns that high blood pressure, or hypertension, is very common in the [place]. Hypertension is largely due to excessive salt intake, but other important risk factors include being overweight, physical inactivity, drinking too much alcohol, smoking, and not eating enough potassium from fruits and vegetables. High blood pressure is a leading risk factor for heart attack, stroke, congestive heart failure, dementia, kidney failure, and premature death. Long-term effects of excessive salt intake include stiffening of the arteries and thickening of heart muscle and organ damage. Recommendations include ways to reduce hypertension and the  risk of heart disease.  Diseases of Our Time - Focusing on Diabetes Clinical staff conducted group or individual video education with verbal and written material and guidebook.  Patient learns why the best way to stop diseases of our time is prevention, through food and other lifestyle changes. Medicine (such as prescription pills and surgeries) is often only a Band-Aid on the problem, not a long-term solution. Most common diseases of our time include obesity, type 2 diabetes, hypertension, heart disease, and cancer. The Pritikin Program is recommended and has been proven to help reduce, reverse, and/or prevent the damaging effects of metabolic syndrome.  Nutrition   Overview of the Pritikin Eating Plan  Clinical staff conducted group or individual video education with verbal and written material and guidebook.  Patient learns about the [place] for disease risk reduction. The [place] emphasizes a wide variety of unrefined, minimally-processed carbohydrates, like fruits, vegetables, whole grains, and legumes. Go, Caution, and Stop food choices are explained. Plant-based and lean animal proteins are emphasized. Rationale provided for low sodium intake for blood pressure control, low added sugars for blood sugar stabilization, and low added fats and oils for coronary artery disease risk reduction and weight management.  Calorie Density  Clinical staff conducted group or individual video education with verbal and written material and guidebook.  Patient learns about calorie density and how it impacts the Pritikin Eating Plan. Knowing the characteristics of the food you choose will help you decide whether those foods will lead to weight gain or weight loss, and whether you want to consume more or less of them. Weight loss is usually a side effect of the Pritikin Eating Plan because of its focus on low calorie-dense foods.  Label Reading  Clinical staff conducted group or  individual video education with verbal and written material and guidebook.  Patient learns about the Pritikin recommended label reading guidelines and corresponding recommendations regarding calorie density, added sugars, sodium content, and whole grains.  Dining Out - Part 1  Clinical staff conducted group or individual video education with verbal and written material and guidebook.  Patient learns that restaurant meals can be sabotaging because they can be so high in calories, fat, sodium, and/or sugar. Patient learns recommended strategies on how to positively address this and avoid unhealthy pitfalls.  Facts on Fats  Clinical staff conducted group or individual video education with verbal and written material and guidebook.  Patient learns that lifestyle modifications can be just as effective, if not more so, as many medications for lowering your risk of heart disease. A Pritikin lifestyle can help to reduce your risk of inflammation and atherosclerosis (cholesterol build-up, or plaque, in the artery walls). Lifestyle interventions such as  dietary choices and physical activity address the cause of atherosclerosis. A review of the types of fats and their impact on blood cholesterol levels, along with dietary recommendations to reduce fat intake is also included.  Nutrition Action Plan  Clinical staff conducted group or individual video education with verbal and written material and guidebook.  Patient learns how to incorporate Pritikin recommendations into their lifestyle. Recommendations include planning and keeping personal health goals in mind as an important part of their success.  Healthy Mind-Set    Healthy Minds, Bodies, Hearts  Clinical staff conducted group or individual video education with verbal and written material and guidebook.  Patient learns how to identify when they are stressed. Video will discuss the impact of that stress, as well as the many benefits of stress management.  Patient will also be introduced to stress management techniques. The way we think, act, and feel has an impact on our hearts.  How Our Thoughts Can Heal Our Hearts  Clinical staff conducted group or individual video education with verbal and written material and guidebook.  Patient learns that negative thoughts can cause depression and anxiety. This can result in negative lifestyle behavior and serious health problems. Cognitive behavioral therapy is an effective method to help control our thoughts in order to change and improve our emotional outlook.  Additional Videos:  Exercise    Improving Performance  Clinical staff conducted group or individual video education with verbal and written material and guidebook.  Patient learns to use a non-linear approach by alternating intensity levels and lengths of time spent exercising to help burn more calories and lose more body fat. Cardiovascular exercise helps improve heart health, metabolism, hormonal balance, blood sugar control, and recovery from fatigue. Resistance training improves strength, endurance, balance, coordination, reaction time, metabolism, and muscle mass. Flexibility exercise improves circulation, posture, and balance. Seek guidance from your physician and exercise physiologist before implementing an exercise routine and learn your capabilities and proper form for all exercise.  Introduction to Yoga  Clinical staff conducted group or individual video education with verbal and written material and guidebook.  Patient learns about yoga, a discipline of the coming together of mind, breath, and body. The benefits of yoga include improved flexibility, improved range of motion, better posture and core strength, increased lung function, weight loss, and positive self-image. Yoga's heart health benefits include lowered blood pressure, healthier heart rate, decreased cholesterol and triglyceride levels, improved immune function, and reduced stress.  Seek guidance from your physician and exercise physiologist before implementing an exercise routine and learn your capabilities and proper form for all exercise.  Medical   Aging: Enhancing Your Quality of Life  Clinical staff conducted group or individual video education with verbal and written material and guidebook.  Patient learns key strategies and recommendations to stay in good physical health and enhance quality of life, such as prevention strategies, having an advocate, securing a [place], and keeping a list of medications and system for tracking them. It also discusses how to avoid risk for bone loss.  Biology of Weight Control  Clinical staff conducted group or individual video education with verbal and written material and guidebook.  Patient learns that weight gain occurs because we consume more calories than we burn (eating more, moving less). Even if your body weight is normal, you may have higher ratios of fat compared to muscle mass. Too much body fat puts you at increased risk for cardiovascular disease, heart attack, stroke, type  2 diabetes, and obesity-related cancers. In addition to exercise, following the [place] can help reduce your risk.  Decoding Lab Results  Clinical staff conducted group or individual video education with verbal and written material and guidebook.  Patient learns that lab test reflects one measurement whose values change over time and are influenced by many factors, including medication, stress, sleep, exercise, food, hydration, pre-existing medical conditions, and more. It is recommended to use the knowledge from this video to become more involved with your lab results and evaluate your numbers to speak with your doctor.   Diseases of Our Time - Overview  Clinical staff conducted group or individual video education with verbal and written material and guidebook.  Patient learns that according to the CDC, 50%  to 70% of chronic diseases (such as obesity, type 2 diabetes, elevated lipids, hypertension, and heart disease) are avoidable through lifestyle improvements including healthier food choices, listening to satiety cues, and increased physical activity.  Sleep Disorders Clinical staff conducted group or individual video education with verbal and written material and guidebook.  Patient learns how good quality and duration of sleep are important to overall health and well-being. Patient also learns about sleep disorders and how they impact health along with recommendations to address them, including discussing with a physician.  Nutrition  Dining Out - Part 2 Clinical staff conducted group or individual video education with verbal and written material and guidebook.  Patient learns how to plan ahead and communicate in order to maximize their dining experience in a healthy and nutritious manner. Included are recommended food choices based on the type of restaurant the patient is visiting.   Fueling a Best boy conducted group or individual video education with verbal and written material and guidebook.  There is a strong connection between our food choices and our health. Diseases like obesity and type 2 diabetes are very prevalent and are in large-part due to lifestyle choices. The Pritikin Eating Plan provides plenty of food and hunger-curbing satisfaction. It is easy to follow, affordable, and helps reduce health risks.  Menu Workshop  Clinical staff conducted group or individual video education with verbal and written material and guidebook.  Patient learns that restaurant meals can sabotage health goals because they are often packed with calories, fat, sodium, and sugar. Recommendations include strategies to plan ahead and to communicate with the manager, chef, or server to help order a healthier meal.  Planning Your Eating Strategy  Clinical staff conducted group or individual  video education with verbal and written material and guidebook.  Patient learns about the [place] and its benefit of reducing the risk of disease. The [place] does not focus on calories. Instead, it emphasizes high-quality, nutrient-rich foods. By knowing the characteristics of the foods, we choose, we can determine their calorie density and make informed decisions.  Targeting Your Nutrition Priorities  Clinical staff conducted group or individual video education with verbal and written material and guidebook.  Patient learns that lifestyle habits have a tremendous impact on disease risk and progression. This video provides eating and physical activity recommendations based on your personal health goals, such as reducing LDL cholesterol, losing weight, preventing or controlling type 2 diabetes, and reducing high blood pressure.  Vitamins and Minerals  Clinical staff conducted group or individual video education with verbal and written material and guidebook.  Patient learns different ways to obtain key vitamins and minerals, including through a recommended healthy diet. It is important  to discuss all supplements you take with your doctor.   Healthy Mind-Set    Smoking Cessation  Clinical staff conducted group or individual video education with verbal and written material and guidebook.  Patient learns that cigarette smoking and tobacco addiction pose a serious health risk which affects millions of people. Stopping smoking will significantly reduce the risk of heart disease, lung disease, and many forms of cancer. Recommended strategies for quitting are covered, including working with your doctor to develop a successful plan.  Culinary   Becoming a Financial trader conducted group or individual video education with verbal and written material and guidebook.  Patient learns that cooking at home can be healthy, cost-effective, quick, and puts them in control.  Keys to cooking healthy recipes will include looking at your recipe, assessing your equipment needs, planning ahead, making it simple, choosing cost-effective seasonal ingredients, and limiting the use of added fats, salts, and sugars.  Cooking - Breakfast and Snacks  Clinical staff conducted group or individual video education with verbal and written material and guidebook.  Patient learns how important breakfast is to satiety and nutrition through the entire day. Recommendations include key foods to eat during breakfast to help stabilize blood sugar levels and to prevent overeating at meals later in the day. Planning ahead is also a key component.  Cooking - Human resources officer conducted group or individual video education with verbal and written material and guidebook.  Patient learns eating strategies to improve overall health, including an approach to cook more at home. Recommendations include thinking of animal protein as a side on your plate rather than center stage and focusing instead on lower calorie dense options like vegetables, fruits, whole grains, and plant-based proteins, such as beans. Making sauces in large quantities to freeze for later and leaving the skin on your vegetables are also recommended to maximize your experience.  Cooking - Healthy Salads and Dressing Clinical staff conducted group or individual video education with verbal and written material and guidebook.  Patient learns that vegetables, fruits, whole grains, and legumes are the foundations of the [place]. Recommendations include how to incorporate each of these in flavorful and healthy salads, and how to create homemade salad dressings. Proper handling of ingredients is also covered. Cooking - Soups and [employer] - Soups and Desserts Clinical staff conducted group or individual video education with verbal and written material and guidebook.  Patient learns that Pritikin soups and  desserts make for easy, nutritious, and delicious snacks and meal components that are low in sodium, fat, sugar, and calorie density, while high in vitamins, minerals, and filling fiber. Recommendations include simple and healthy ideas for soups and desserts.   Overview     The Pritikin Solution Program Overview Clinical staff conducted group or individual video education with verbal and written material and guidebook.  Patient learns that the results of the [place] Program have been documented in more than 100 articles published in peer-reviewed journals, and the benefits include reducing risk factors for (and, in some cases, even reversing) high cholesterol, high blood pressure, type 2 diabetes, obesity, and more! An overview of the three key pillars of the Pritikin Program will be covered: eating well, doing regular exercise, and having a healthy mind-set.  WORKSHOPS  Exercise: Exercise Basics: Building Your Action Plan Clinical staff led group instruction and group discussion with PowerPoint presentation and patient guidebook. To enhance the learning environment the use of posters, models and  videos may be added. At the conclusion of this workshop, patients will comprehend the difference between physical activity and exercise, as well as the benefits of incorporating both, into their routine. Patients will understand the FITT (Frequency, Intensity, Time, and Type) principle and how to use it to build an exercise action plan. In addition, safety concerns and other considerations for exercise and cardiac rehab will be addressed by the presenter. The purpose of this lesson is to promote a comprehensive and effective weekly exercise routine in order to improve patients' overall level of fitness.   Managing Heart Disease: Your Path to a Healthier Heart Clinical staff led group instruction and group discussion with PowerPoint presentation and patient guidebook. To enhance the learning environment  the use of posters, models and videos may be added.At the conclusion of this workshop, patients will understand the anatomy and physiology of the heart. Additionally, they will understand how Pritikin's three pillars impact the risk factors, the progression, and the management of heart disease.  The purpose of this lesson is to provide a high-level overview of the heart, heart disease, and how the Pritikin lifestyle positively impacts risk factors.  Exercise Biomechanics Clinical staff led group instruction and group discussion with PowerPoint presentation and patient guidebook. To enhance the learning environment the use of posters, models and videos may be added. Patients will learn how the structural parts of their bodies function and how these functions impact their daily activities, movement, and exercise. Patients will learn how to promote a neutral spine, learn how to manage pain, and identify ways to improve their physical movement in order to promote healthy living. The purpose of this lesson is to expose patients to common physical limitations that impact physical activity. Participants will learn practical ways to adapt and manage aches and pains, and to minimize their effect on regular exercise. Patients will learn how to maintain good posture while sitting, walking, and lifting.  Balance Training and Fall Prevention  Clinical staff led group instruction and group discussion with PowerPoint presentation and patient guidebook. To enhance the learning environment the use of posters, models and videos may be added. At the conclusion of this workshop, patients will understand the importance of their sensorimotor skills (vision, proprioception, and the vestibular system) in maintaining their ability to balance as they age. Patients will apply a variety of balancing exercises that are appropriate for their current level of function. Patients will understand the common causes for  poor balance, possible solutions to these problems, and ways to modify their physical environment in order to minimize their fall risk. The purpose of this lesson is to teach patients about the importance of maintaining balance as they age and ways to minimize their risk of falling.  WORKSHOPS   Nutrition:  Fueling a Scientist, research (physical sciences) led group instruction and group discussion with PowerPoint presentation and patient guidebook. To enhance the learning environment the use of posters, models and videos may be added. Patients will review the foundational principles of the [place] and understand what constitutes a serving size in each of the food groups. Patients will also learn Pritikin-friendly foods that are better choices when away from home and review make-ahead meal and snack options. Calorie density will be reviewed and applied to three nutrition priorities: weight maintenance, weight loss, and weight gain. The purpose of this lesson is to reinforce (in a group setting) the key concepts around what patients are recommended to eat and how to apply these guidelines when away from  home by planning and selecting Pritikin-friendly options. Patients will understand how calorie density may be adjusted for different weight management goals.  Mindful Eating  Clinical staff led group instruction and group discussion with PowerPoint presentation and patient guidebook. To enhance the learning environment the use of posters, models and videos may be added. Patients will briefly review the concepts of the Pritikin Eating Plan and the importance of low-calorie dense foods. The concept of mindful eating will be introduced as well as the importance of paying attention to internal hunger signals. Triggers for non-hunger eating and techniques for dealing with triggers will be explored. The purpose of this lesson is to provide patients with the opportunity to review the basic principles of the  Pritikin Eating Plan, discuss the value of eating mindfully and how to measure internal cues of hunger and fullness using the Hunger Scale. Patients will also discuss reasons for non-hunger eating and learn strategies to use for controlling emotional eating.  Targeting Your Nutrition Priorities Clinical staff led group instruction and group discussion with PowerPoint presentation and patient guidebook. To enhance the learning environment the use of posters, models and videos may be added. Patients will learn how to determine their genetic susceptibility to disease by reviewing their family history. Patients will gain insight into the importance of diet as part of an overall healthy lifestyle in mitigating the impact of genetics and other environmental insults. The purpose of this lesson is to provide patients with the opportunity to assess their personal nutrition priorities by looking at their family history, their own health history and current risk factors. Patients will also be able to discuss ways of prioritizing and modifying the Pritikin Eating Plan for their highest risk areas  Menu  Clinical staff led group instruction and group discussion with PowerPoint presentation and patient guidebook. To enhance the learning environment the use of posters, models and videos may be added. Using menus brought in from [employer], or printed from [employer], patients will apply the Pritikin dining out guidelines that were presented in the [employer] video. Patients will also be able to practice these guidelines in a variety of provided scenarios. The purpose of this lesson is to provide patients with the opportunity to practice hands-on learning of the Pritikin Dining Out guidelines with actual menus and practice scenarios.  Label Reading Clinical staff led group instruction and group discussion with PowerPoint presentation and patient guidebook. To enhance the learning environment  the use of posters, models and videos may be added. Patients will review and discuss the Pritikin label reading guidelines presented in Pritikin's Label Reading Educational series video. Using fool labels brought in from local grocery stores and markets, patients will apply the label reading guidelines and determine if the packaged food meet the Pritikin guidelines. The purpose of this lesson is to provide patients with the opportunity to review, discuss, and practice hands-on learning of the Pritikin Label Reading guidelines with actual packaged food labels. Cooking School  Pritikin's [employer] are designed to teach patients ways to prepare quick, simple, and affordable recipes at home. The importance of nutrition's role in chronic disease risk reduction is reflected in its emphasis in the overall Pritikin program. By learning how to prepare essential core Pritikin Eating Plan recipes, patients will increase control over what they eat; be able to customize the flavor of foods without the use of added salt, sugar, or fat; and improve the quality of the food they consume. By learning a set of  core recipes which are easily assembled, quickly prepared, and affordable, patients are more likely to prepare more healthy foods at home. These workshops focus on convenient breakfasts, simple entres, side dishes, and desserts which can be prepared with minimal effort and are consistent with nutrition recommendations for cardiovascular risk reduction. Cooking [employer] are taught by a Armed forces logistics/support/administrative officer (RD) who has been trained by the [employer]. The chef or RD has a clear understanding of the importance of minimizing - if not completely eliminating - added fat, sugar, and sodium in recipes. Throughout the series of Cooking School Workshop sessions, patients will learn about healthy ingredients and efficient methods of cooking to build confidence in their capability to  prepare    Cooking School weekly topics:  Adding Flavor- Sodium-Free  Fast and Healthy Breakfasts  Powerhouse Plant-Based Proteins  Satisfying Salads and Dressings  Simple Sides and Sauces  International Cuisine-Spotlight on the [employer] Zones  Delicious Desserts  Savory Soups  Efficiency Cooking - Meals in a Snap  Tasty Appetizers and Snacks  Comforting Weekend Breakfasts  One-Pot Wonders   Fast Evening Meals  Easy Entertaining  Personalizing Your Pritikin Plate  WORKSHOPS   Healthy Mindset (Psychosocial): New Thoughts, New Behaviors Clinical staff led group instruction and group discussion with PowerPoint presentation and patient guidebook. To enhance the learning environment the use of posters, models and videos may be added. Patients will learn and practice techniques for developing effective health and lifestyle goals. Patients will be able to effectively apply the goal setting process learned to develop at least one new personal goal.  The purpose of this lesson is to expose patients to a new skill set of behavior modification techniques such as techniques setting SMART goals, overcoming barriers, and achieving new thoughts and new behaviors.  Managing Moods and Relationships Clinical staff led group instruction and group discussion with PowerPoint presentation and patient guidebook. To enhance the learning environment the use of posters, models and videos may be added. Patients will learn how emotional and chronic stress factors can impact their health and relationships. They will learn healthy ways to manage their moods and utilize positive coping mechanisms. In addition, [facility] patients will learn ways to improve communication skills. The purpose of this lesson is to expose patients to ways of understanding how one's mood and health are intimately connected. Developing a healthy outlook can help build positive relationships and connections with others. Patients will understand the  importance of utilizing effective communication skills that include actively listening and being heard. They will learn and understand the importance of the "4 Cs" and especially Connections in fostering of a Healthy Mind-Set.  Healthy Sleep for a Healthy Heart Clinical staff led group instruction and group discussion with PowerPoint presentation and patient guidebook. To enhance the learning environment the use of posters, models and videos may be added. At the conclusion of this workshop, patients will be able to demonstrate knowledge of the importance of sleep to overall health, well-being, and quality of life. They will understand the symptoms of, and treatments for, common sleep disorders. Patients will also be able to identify daytime and nighttime behaviors which impact sleep, and they will be able to apply these tools to help manage sleep-related challenges. The purpose of this lesson is to provide patients with a general overview of sleep and outline the importance of quality sleep. Patients will learn about a few of the most common sleep disorders. Patients will also be introduced to the concept of "  sleep hygiene," and discover ways to self-manage certain sleeping problems through simple daily behavior changes. Finally, the workshop will motivate patients by clarifying the links between quality sleep and their goals of heart-healthy living.   Recognizing and Reducing Stress Clinical staff led group instruction and group discussion with PowerPoint presentation and patient guidebook. To enhance the learning environment the use of posters, models and videos may be added. At the conclusion of this workshop, patients will be able to understand the types of stress reactions, differentiate between acute and chronic stress, and recognize the impact that chronic stress has on their health. They will also be able to apply different coping mechanisms, such as reframing negative self-talk. Patients will have the  opportunity to practice a variety of stress management techniques, such as deep abdominal breathing, progressive muscle relaxation, and/or guided imagery.  The purpose of this lesson is to educate patients on the role of stress in their lives and to provide healthy techniques for coping with it.  Learning Barriers/Preferences:  Learning Barriers/Preferences - 05/24/22 0944       Learning Barriers/Preferences   Learning Barriers Hearing    Learning Preferences Computer/Internet;Group Instruction;Individual Instruction;Pictoral;Skilled Demonstration;Written Material;Video             Education Topics:  Knowledge Questionnaire Score:  Knowledge Questionnaire Score - 05/24/22 0944       Knowledge Questionnaire Score   Pre Score 22/24             Core Components/Risk Factors/Patient Goals at Admission:  Personal Goals and Risk Factors at Admission - 05/24/22 0945       Core Components/Risk Factors/Patient Goals on Admission    Weight Management Yes;Weight Loss    Intervention Weight Management: Develop a combined nutrition and exercise program designed to reach desired caloric intake, while maintaining appropriate intake of nutrient and fiber, sodium and fats, and appropriate energy expenditure required for the weight goal.;Weight Management: Provide education and appropriate resources to help participant work on and attain dietary goals.;Weight Management/Obesity: Establish reasonable short term and long term weight goals.;Obesity: Provide education and appropriate resources to help participant work on and attain dietary goals.    Admit Weight 189 lb (85.7 kg)    Goal Weight: Long Term 169 lb (76.7 kg)    Expected Outcomes Short Term: Continue to assess and modify interventions until short term weight is achieved;Long Term: Adherence to nutrition and physical activity/exercise program aimed toward attainment of established weight goal;Weight Loss: Understanding of general  recommendations for a balanced deficit meal plan, which promotes 1-2 lb weight loss per week and includes a negative energy balance of [phone] kcal/d;Understanding recommendations for meals to include 15-35% energy as protein, 25-35% energy from fat, 35-60% energy from carbohydrates, less than 200mg  of dietary cholesterol, 20-35 gm of total fiber daily;Understanding of distribution of calorie intake throughout the day with the consumption of 4-5 meals/snacks    Hypertension Yes    Intervention Provide education on lifestyle modifcations including regular physical activity/exercise, weight management, moderate sodium restriction and increased consumption of fresh fruit, vegetables, and low fat dairy, alcohol moderation, and smoking cessation.;Monitor prescription use compliance.    Expected Outcomes Short Term: Continued assessment and intervention until BP is < 140/77mm HG in hypertensive participants. < 130/64mm HG in hypertensive participants with diabetes, heart failure or chronic kidney disease.;Long Term: Maintenance of blood pressure at goal levels.    Lipids Yes    Intervention Provide education and support for participant on nutrition & aerobic/resistive exercise along with prescribed  medications to achieve LDL 70mg , HDL >40mg .    Expected Outcomes Short Term: Participant states understanding of desired cholesterol values and is compliant with medications prescribed. Participant is following exercise prescription and nutrition guidelines.;Long Term: Cholesterol controlled with medications as prescribed, with individualized exercise RX and with personalized nutrition plan. Value goals: LDL < 70mg , HDL > 40 mg.             Core Components/Risk Factors/Patient Goals Review:   Goals and Risk Factor Review     Row Name 05/28/22 0948 06/20/22 0838           Core Components/Risk Factors/Patient Goals Review   Personal Goals Review Weight Management/Obesity;Hypertension;Lipids Weight  Management/Obesity;Hypertension;Lipids      Review Frank [name] started intensive cardiac rehab on 05/28/22. Frank [name] did well with exercise for his fitness level as the patient is somewhat deconditoned Frank [name] has been doing well with exercise at [employer]. Vital signs have been stable. Frank [name] has lost 2.2 kg since starting the program.      Expected Outcomes Charle will continue to participate in intensive cardiac rehab for exercise, nutrition and lifestyle modifications Charle will continue to participate in intensive cardiac rehab for exercise, nutrition and lifestyle modifications               Core Components/Risk Factors/Patient Goals at Discharge (Final Review):   Goals and Risk Factor Review - 06/20/22 0838       Core Components/Risk Factors/Patient Goals Review   Personal Goals Review Weight Management/Obesity;Hypertension;Lipids    Review Frank [name] has been doing well with exercise at [employer]. Vital signs have been stable. Frank [name] has lost 2.2 kg since starting the program.    Expected Outcomes Charle will continue to participate in intensive cardiac rehab for exercise, nutrition and lifestyle modifications             ITP Comments:  ITP Comments     Row Name 05/24/22 1220 05/28/22 0935 06/20/22 0826       ITP Comments Dr Fransico Him MD, Medical Director, Introduction to [facility]/ Intensive Cardiac Rehab. Initial [facility] Packet reviewed with the patient.and his wife. 30 Day ITP Review. Frank [name] started intensive cardiac rehab on 05/28/22 and did well with exercise. 30 Day ITP Review. Frank [name] has good attendnace and participation in intensive cardiac rehab              Comments: See ITP comments.Harrell Gave RN BSN

## 2022-06-25 ENCOUNTER — Encounter (HOSPITAL_COMMUNITY)
Admission: RE | Admit: 2022-06-25 | Discharge: 2022-06-25 | Disposition: A | Discharge status: Home / Self Care | Payer: Medicare Other | Source: Ambulatory Visit | Attending: Internal Medicine | Admitting: Internal Medicine

## 2022-06-25 DIAGNOSIS — Z9889 Other specified postprocedural states: Secondary | ICD-10-CM

## 2022-06-27 ENCOUNTER — Encounter (HOSPITAL_COMMUNITY)
Admission: RE | Admit: 2022-06-27 | Discharge: 2022-06-27 | Disposition: A | Discharge status: Home / Self Care | Payer: Medicare Other | Source: Ambulatory Visit | Attending: Internal Medicine | Admitting: Internal Medicine

## 2022-06-27 DIAGNOSIS — Z9889 Other specified postprocedural states: Secondary | ICD-10-CM | POA: Diagnosis not present

## 2022-06-27 NOTE — Progress Notes (Signed)
Reviewed home exercise Rx with patient today.  Encouraged warm-up, cool-down, and stretching. Reviewed THRR of  60 - 120 and keeping RPE between 11-13. Encouraged to hydrate with activity.  Reviewed weather parameters for temperature and humidity for safe exercise outdoors. Reviewed S/S to terminate exercise and when to call 911 vs MD.  Pt encouraged to always carry a cell phone for safety when exercising outdoors. Pt verbalized understanding of the home exercise Rx and was provided a copy.   Kristie Bracewell MS, ACSM-CEP, CCRP  

## 2022-06-28 ENCOUNTER — Other Ambulatory Visit: Payer: Self-pay | Admitting: Internal Medicine

## 2022-07-04 ENCOUNTER — Encounter (HOSPITAL_COMMUNITY): Payer: Medicare Other

## 2022-07-06 ENCOUNTER — Encounter (HOSPITAL_COMMUNITY)
Admission: RE | Admit: 2022-07-06 | Discharge: 2022-07-06 | Disposition: A | Discharge status: Home / Self Care | Payer: Medicare Other | Source: Ambulatory Visit | Attending: Internal Medicine | Admitting: Internal Medicine

## 2022-07-06 DIAGNOSIS — Z9889 Other specified postprocedural states: Secondary | ICD-10-CM | POA: Insufficient documentation

## 2022-07-06 DIAGNOSIS — Z5189 Encounter for other specified aftercare: Secondary | ICD-10-CM | POA: Diagnosis not present

## 2022-07-07 ENCOUNTER — Other Ambulatory Visit: Payer: Self-pay | Admitting: Family Medicine

## 2022-07-09 ENCOUNTER — Encounter (HOSPITAL_COMMUNITY)
Admission: RE | Admit: 2022-07-09 | Discharge: 2022-07-09 | Disposition: A | Discharge status: Home / Self Care | Payer: Medicare Other | Source: Ambulatory Visit | Attending: Internal Medicine | Admitting: Internal Medicine

## 2022-07-09 DIAGNOSIS — Z5189 Encounter for other specified aftercare: Secondary | ICD-10-CM | POA: Diagnosis not present

## 2022-07-09 DIAGNOSIS — Z9889 Other specified postprocedural states: Secondary | ICD-10-CM | POA: Diagnosis not present

## 2022-07-11 ENCOUNTER — Encounter (HOSPITAL_COMMUNITY)
Admission: RE | Admit: 2022-07-11 | Discharge: 2022-07-11 | Disposition: A | Discharge status: Home / Self Care | Payer: Medicare Other | Source: Ambulatory Visit | Attending: Internal Medicine | Admitting: Internal Medicine

## 2022-07-11 DIAGNOSIS — Z5189 Encounter for other specified aftercare: Secondary | ICD-10-CM | POA: Diagnosis not present

## 2022-07-11 DIAGNOSIS — Z9889 Other specified postprocedural states: Secondary | ICD-10-CM | POA: Diagnosis not present

## 2022-07-16 ENCOUNTER — Encounter (HOSPITAL_COMMUNITY)
Admission: RE | Admit: 2022-07-16 | Discharge: 2022-07-16 | Disposition: A | Discharge status: Home / Self Care | Payer: Medicare Other | Source: Ambulatory Visit | Attending: Internal Medicine | Admitting: Internal Medicine

## 2022-07-16 DIAGNOSIS — Z9889 Other specified postprocedural states: Secondary | ICD-10-CM | POA: Diagnosis not present

## 2022-07-16 DIAGNOSIS — Z5189 Encounter for other specified aftercare: Secondary | ICD-10-CM | POA: Diagnosis not present

## 2022-07-17 NOTE — Progress Notes (Signed)
Cardiac Individual Treatment Plan  Patient Details  Name: Frank [name] MRN: [id number] Date of Birth: 1951/07/08 Referring Provider:   Flowsheet Row INTENSIVE CARDIAC REHAB ORIENT from 05/24/2022 in [facility]  Referring Provider Dr [name], MD       Initial Encounter Date:  [place] from 05/24/2022 in [facility]  Date 05/24/22       Visit Diagnosis: 01/02/22 S/P mitral valve repair at [facility] Dr [name]  Patient's Home Medications on Admission:  Current Outpatient Medications:    acetaminophen (TYLENOL) 500 MG tablet, Take 1,000 mg by mouth daily., Disp: , Rfl:    albuterol (VENTOLIN HFA) 108 (90 Base) MCG/ACT inhaler, INHALE 2 PUFFS BY MOUTH EVERY 6 HOURS AS NEEDED FOR WHEEZE OR SHORTNESS OF BREATH, Disp: 8.5 each, Rfl: 1   ascorbic acid (VITAMIN C) 500 MG tablet, Take 500 mg by mouth daily., Disp: , Rfl:    aspirin EC 81 MG tablet, Take 1 tablet (81 mg total) by mouth daily. Swallow whole., Disp: 90 tablet, Rfl: 3   cholecalciferol (VITAMIN D) 25 MCG (1000 UNIT) tablet, Take 1,000 Units by mouth daily., Disp: , Rfl:    fluticasone (FLONASE) 50 MCG/ACT nasal spray, Place 1 spray into both nostrils daily as needed for allergies or rhinitis., Disp: , Rfl:    furosemide (LASIX) 20 MG tablet, Take 1 tablet (20 mg total) by mouth 2 (two) times daily. (Patient taking differently: Take 40 mg by mouth 2 (two) times daily.), Disp: 180 tablet, Rfl: 3   lisinopril-hydrochlorothiazide (ZESTORETIC) 20-25 MG tablet, TAKE 1 TABLET BY MOUTH EVERY DAY, Disp: 90 tablet, Rfl: 1   meloxicam (MOBIC) 15 MG tablet, Take 1 tablet (15 mg total) by mouth daily., Disp: 30 tablet, Rfl: 1   Potassium Chloride ER 20 MEQ TBCR, Take 0.5 tablets by mouth daily., Disp: 45 tablet, Rfl: 3   rosuvastatin (CRESTOR) 5 MG tablet, TAKE 1 TABLET (5 MG TOTAL) BY MOUTH DAILY., Disp: 90 tablet, Rfl: 1   tamsulosin (FLOMAX)  0.4 MG CAPS capsule, TAKE 1 CAPSULE BY MOUTH EVERY DAY, Disp: 90 capsule, Rfl: 1   triamcinolone (KENALOG) 0.1 % paste, Apply to affected area 1-2 times daily (Patient taking differently: 1 application  daily as needed (mouth sores).), Disp: 5 g, Rfl: 12   vitamin B-12 (CYANOCOBALAMIN) 1000 MCG tablet, Take 1,000 mcg by mouth daily., Disp: , Rfl:    Zinc 50 MG CAPS, Take 50 mg by mouth daily., Disp: , Rfl:   Past Medical History: Past Medical History:  Diagnosis Date   Allergy    Asthma    Coronary artery disease    GERD (gastroesophageal reflux disease)    Headache(784.0)    Hypertension     Tobacco Use: Social History   Tobacco Use  Smoking Status Former  Smokeless Tobacco Never  Tobacco Comments   quit in 1972    Labs: Review Flowsheet  More data exists      Latest Ref Rng & Units 06/23/2021 09/04/2021 11/15/2021 11/28/2021 03/13/2022  Labs for ITP Cardiac and Pulmonary Rehab  Cholestrol 100 - 199 mg/dL - 197  - 192  193   LDL (calc) 0 - 99 mg/dL - 139  - 135  128   HDL-C >39 mg/dL - 37  - 34  30   Trlycerides 0 - 149 mg/dL - 113  - 128  196   Hemoglobin A1c 4.6 - 6.5 % 6.2  - - - -  PH, Arterial 7.350 - 7.450 - - 7.354  - -  PCO2 arterial 32.0 - 48.0 mmHg - - 49.5  - -  Bicarbonate 20.0 - 28.0 mmol/L - - 27.4  29.3  27.6  - -  TCO2 22 - 32 mmol/L - - 29  31  29   - -  O2 Saturation % - - 70.0  72.0  94.0  - -    Capillary Blood Glucose: No results found for: "GLUCAP"   Exercise Target Goals: Exercise Program Goal: Individual exercise prescription set using results from initial 6 min walk test and THRR while considering  patient's activity barriers and safety.   Exercise Prescription Goal: Initial exercise prescription builds to 30-45 minutes a day of aerobic activity, 2-3 days per week.  Home exercise guidelines will be given to patient during program as part of exercise prescription that the participant will acknowledge.  Activity Barriers & Risk  Stratification:  Activity Barriers & Cardiac Risk Stratification - 05/24/22 1028       Activity Barriers & Cardiac Risk Stratification   Activity Barriers Joint Problems;Deconditioning;Balance Concerns;Assistive Device;Muscular Weakness;Arthritis    Cardiac Risk Stratification High   6MWT 2.49 METs            6 Minute Walk:  6 Minute Walk     Row Name 05/24/22 1024         6 Minute Walk   Phase Initial     Distance 1047 feet     Walk Time 6 minutes     # of Rest Breaks 0     MPH 1.98     METS 2.49     RPE 9     Perceived Dyspnea  1     VO2 Peak 8.73     Symptoms Yes (comment)     Comments 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 L knee 4/10.     Resting HR 102 bpm     Resting BP 122/88     Resting Oxygen Saturation  97 %     Exercise Oxygen Saturation  during 6 min walk 99 %     Max Ex. HR 126 bpm     Max Ex. BP 154/80     2 Minute Post BP 128/78              Oxygen Initial Assessment:   Oxygen Re-Evaluation:   Oxygen Discharge (Final Oxygen Re-Evaluation):   Initial Exercise Prescription:  Initial Exercise Prescription - 05/24/22 1000       Date of Initial Exercise RX and Referring Provider   Date 05/24/22    Referring Provider Dr [name], MD    Expected Discharge Date 07/27/22      Recumbant Bike   Level 1.5    RPM 40    Minutes 15    METs 2.3      NuStep   Level 2    SPM 70    Minutes 15    METs 2.3      Prescription Details   Frequency (times per week) 2    Duration Progress to 30 minutes of continuous aerobic without signs/symptoms of physical distress      Intensity   THRR 40-80% of Max Heartrate 60-120    Ratings of Perceived Exertion 11-13    Perceived Dyspnea 0-4      Progression   Progression Continue progressive overload as per policy without signs/symptoms or physical distress.      Resistance Training  Training Prescription Yes    Weight 3    Reps 10-15             Perform Capillary Blood  Glucose checks as needed.  Exercise Prescription Changes:   Exercise Prescription Changes     Row Name 05/28/22 1500 06/20/22 1300 06/27/22 1400         Response to Exercise   Blood Pressure (Admit) 132/72 120/80 158/78     Blood Pressure (Exercise) 118/70 132/80 142/92     Blood Pressure (Exit) 118/80 130/80 130/78     Heart Rate (Admit) 98 bpm 93 bpm 85 bpm     Heart Rate (Exercise) 107 bpm 124 bpm 111 bpm     Heart Rate (Exit) 92 bpm 93 bpm 91 bpm     Rating of Perceived Exertion (Exercise) 11 11 12      Symptoms None None None     Comments Pt's first day in the CRP2 program Reviewed METs Reviewed METs, goals and home exercise     Duration Continue with 30 min of aerobic exercise without signs/symptoms of physical distress. Continue with 30 min of aerobic exercise without signs/symptoms of physical distress. Progress to 30 minutes of  aerobic without signs/symptoms of physical distress     Intensity THRR unchanged THRR unchanged THRR unchanged       Progression   Progression Continue to progress workloads to maintain intensity without signs/symptoms of physical distress. Continue to progress workloads to maintain intensity without signs/symptoms of physical distress. Continue to follow PAD protocol     Average METs 2.2 3.5 3.31       Resistance Training   Training Prescription Yes No No     Weight 3 lbs No weights on Wednesdays No weights on Wednesdays     Reps 10-15 -- --     Time 10 Minutes -- --       Interval Training   Interval Training No No No       Bike   Level -- 1  Air dyne bike 1.2     Minutes -- 10 10     METs -- 3.97 3.82       NuStep   Level 2 3 3      Minutes 15 15 15      METs 2.2 3 2.8       Home Exercise Plan   Plans to continue exercise at -- -- Home (comment)     Frequency -- -- Add 3 additional days to program exercise sessions.     Initial Home Exercises Provided -- -- 06/27/22              Exercise Comments:   Exercise Comments      Row Name 05/28/22 1531 06/20/22 1402 06/27/22 1417       Exercise Comments Pt's first day in the CRP2 program. Pt had no complaint sna is off to a good start. Reviewed METs. Pt making progress. Go injections in his knees on Monday. Feeling much bettter today. Did not c/o pain. Increased workload on nustep. Reviewed METs, goals and home exercise Rx. Pt verbalized understnading of the home exercise Rx and was provided a copy.              Exercise Goals and Review:   Exercise Goals     Row Name 05/24/22 1035             Exercise Goals   Increase Physical Activity Yes       Intervention  Provide advice, education, support and counseling about physical activity/exercise needs.;Develop an individualized exercise prescription for aerobic and resistive training based on initial evaluation findings, risk stratification, comorbidities and participant's personal goals.       Expected Outcomes Short Term: Attend rehab on a regular basis to increase amount of physical activity.;Long Term: Add in home exercise to make exercise part of routine and to increase amount of physical activity.;Long Term: Exercising regularly at least 3-5 days a week.       Increase Strength and Stamina Yes       Intervention Provide advice, education, support and counseling about physical activity/exercise needs.;Develop an individualized exercise prescription for aerobic and resistive training based on initial evaluation findings, risk stratification, comorbidities and participant's personal goals.       Expected Outcomes Short Term: Increase workloads from initial exercise prescription for resistance, speed, and METs.;Short Term: Perform resistance training exercises routinely during rehab and add in resistance training at home;Long Term: Improve cardiorespiratory fitness, muscular endurance and strength as measured by increased METs and functional capacity (6MWT)       Able to understand and use rate of perceived exertion  (RPE) scale Yes       Intervention Provide education and explanation on how to use RPE scale       Expected Outcomes Short Term: Able to use RPE daily in rehab to express subjective intensity level;Long Term:  Able to use RPE to guide intensity level when exercising independently       Knowledge and understanding of Target Heart Rate Range (THRR) Yes       Intervention Provide education and explanation of THRR including how the numbers were predicted and where they are located for reference       Expected Outcomes Short Term: Able to state/look up THRR;Short Term: Able to use daily as guideline for intensity in rehab;Long Term: Able to use THRR to govern intensity when exercising independently       Understanding of Exercise Prescription Yes       Intervention Provide education, explanation, and written materials on patient's individual exercise prescription       Expected Outcomes Short Term: Able to explain program exercise prescription;Long Term: Able to explain home exercise prescription to exercise independently                Exercise Goals Re-Evaluation :  Exercise Goals Re-Evaluation     Row Name 05/28/22 1527 06/27/22 1414           Exercise Goal Re-Evaluation   Exercise Goals Review Increase Physical Activity;Increase Strength and Stamina;Able to understand and use rate of perceived exertion (RPE) scale;Knowledge and understanding of Target Heart Rate Range (THRR);Understanding of Exercise Prescription Increase Physical Activity;Increase Strength and Stamina;Able to understand and use rate of perceived exertion (RPE) scale;Knowledge and understanding of Target Heart Rate Range (THRR);Understanding of Exercise Prescription      Comments Pt's first day in the CRP2 program. Pr understands the Exercise Rx, RPE scale and THRR. Reviewed METs, goals and home exercise Rx. Pt has used his air dyne bike at home a few times. Pt will begin to use it 2-3 x/week and build up 30 minutes. Pt  voices that his leg strength has improved and is walking better, especially after his knee injections. Pt also voices increased stamina.      Expected Outcomes Will continue to monitor patient progress ans increase exercise workloads as tolerated. Pt will begin to use his Air Dyne bike at home 2-3  x/week, working up to 30 minutes.               Discharge Exercise Prescription (Final Exercise Prescription Changes):  Exercise Prescription Changes - 06/27/22 1400       Response to Exercise   Blood Pressure (Admit) 158/78    Blood Pressure (Exercise) 142/92    Blood Pressure (Exit) 130/78    Heart Rate (Admit) 85 bpm    Heart Rate (Exercise) 111 bpm    Heart Rate (Exit) 91 bpm    Rating of Perceived Exertion (Exercise) 12    Symptoms None    Comments Reviewed METs, goals and home exercise    Duration Progress to 30 minutes of  aerobic without signs/symptoms of physical distress    Intensity THRR unchanged      Progression   Progression Continue to follow PAD protocol    Average METs 3.31      Resistance Training   Training Prescription No    Weight No weights on Wednesdays      Interval Training   Interval Training No      Bike   Level 1.2    Minutes 10    METs 3.82      NuStep   Level 3    Minutes 15    METs 2.8      Home Exercise Plan   Plans to continue exercise at Home (comment)    Frequency Add 3 additional days to program exercise sessions.    Initial Home Exercises Provided 06/27/22             Nutrition:  Target Goals: Understanding of nutrition guidelines, daily intake of sodium 1500mg , cholesterol 200mg , calories 30% from fat and 7% or less from saturated fats, daily to have 5 or more servings of fruits and vegetables.  Biometrics:  Pre Biometrics - 05/24/22 0942       Pre Biometrics   Height 5' 2.25" (1.581 m)    Weight 85.8 kg    Waist Circumference 43.5 inches    Hip Circumference 42.25 inches    Waist to Hip Ratio 1.03 %    BMI  (Calculated) 34.33    Triceps Skinfold 17 mm    % Body Fat 33.1 %    Grip Strength 34 kg    Flexibility 13 in    Single Leg Stand 8.5 seconds              Nutrition Therapy Plan and Nutrition Goals:  Nutrition Therapy & Goals - 06/29/22 1141       Nutrition Therapy   Diet Heart Healthy Diet    Drug/Food Interactions Statins/Certain Fruits      Personal Nutrition Goals   Nutrition Goal Patient to choose a daily variety of fruits, vegetables, whole grains, lean protein/plant protein, non fat dairy as part of heart healthy lifestyle.    Personal Goal #2 Patient to decrease sodium intake to 1500mg  per day    Personal Goal #3 Patient to identify and limit daily intake of saturated fat, trans fat, sodium, and refined carbohydrates.    Comments Goals in progress. Iris has started making some changes including implemented whole grains, decreased saturated fat, and increased awareness of sodium and sugar. He reports limited support with dietary changes from wife and MIL.      Intervention Plan   Intervention Prescribe, educate and counsel regarding individualized specific dietary modifications aiming towards targeted core components such as weight, hypertension, lipid management, diabetes, heart failure and other comorbidities.  Expected Outcomes Short Term Goal: Understand basic principles of dietary content, such as calories, fat, sodium, cholesterol and nutrients.;Long Term Goal: Adherence to prescribed nutrition plan.             Nutrition Assessments:  Nutrition Assessments - 05/30/22 1123       Rate Your Plate Scores   Pre Score 67            MEDIFICTS Score Key: ?70 Need to make dietary changes  40-70 Heart Healthy Diet ? 40 Therapeutic Level Cholesterol Diet   Flowsheet Row INTENSIVE CARDIAC REHAB from 05/30/2022 in [place]  Picture Your Plate Total Score on Admission 67      Picture Your Plate Scores: [id number] Unhealthy  dietary pattern with much room for improvement. 41-50 Dietary pattern unlikely to meet recommendations for good health and room for improvement. 51-60 More healthful dietary pattern, with some room for improvement.  >60 Healthy dietary pattern, although there may be some specific behaviors that could be improved.    Nutrition Goals Re-Evaluation:  Nutrition Goals Re-Evaluation     Frank [name] Name 05/30/22 1119 06/29/22 1141           Goals   Current Weight 190 lb 7.6 oz (86.4 kg) 175 lb 0.7 oz (79.4 kg)      Comment Patient and his wife present for classes. He does report other life stress and knee pain as barrier to lifestyle changes No new labs since starting cardiac rehab; however, most recent labs from May 2023 show lipoprotein a 117, triglycerides 196, LDL 128, HDL 30, A1c WNL.      Expected Outcome -- Goals in progress. Frank [name] has started making some changes including implemented whole grains, decreased saturated fat, and increased awareness of sodium and sugar. He reports limited support with dietary changes from wife and MIL. He is down 14# since starting our program.               Nutrition Goals Re-Evaluation:  Nutrition Goals Re-Evaluation     Frank [name] Name 05/30/22 1119 06/29/22 1141           Goals   Current Weight 190 lb 7.6 oz (86.4 kg) 175 lb 0.7 oz (79.4 kg)      Comment Patient and his wife present for classes. He does report other life stress and knee pain as barrier to lifestyle changes No new labs since starting cardiac rehab; however, most recent labs from May 2023 show lipoprotein a 117, triglycerides 196, LDL 128, HDL 30, A1c WNL.      Expected Outcome -- Goals in progress. Frank [name] has started making some changes including implemented whole grains, decreased saturated fat, and increased awareness of sodium and sugar. He reports limited support with dietary changes from wife and MIL. He is down 14# since starting our program.               Nutrition Goals  Discharge (Final Nutrition Goals Re-Evaluation):  Nutrition Goals Re-Evaluation - 06/29/22 1141       Goals   Current Weight 175 lb 0.7 oz (79.4 kg)    Comment No new labs since starting cardiac rehab; however, most recent labs from May 2023 show lipoprotein a 117, triglycerides 196, LDL 128, HDL 30, A1c WNL.    Expected Outcome Goals in progress. Frank [name] has started making some changes including implemented whole grains, decreased saturated fat, and increased awareness of sodium and sugar. He reports limited support with dietary changes from wife  and MIL. He is down 14# since starting our program.             Psychosocial: Target Goals: Acknowledge presence or absence of significant depression and/or stress, maximize coping skills, provide positive support system. Participant is able to verbalize types and ability to use techniques and skills needed for reducing stress and depression.  Initial Review & Psychosocial Screening:  Initial Psych Review & Screening - 05/24/22 1003       Initial Review   Current issues with None Identified      Family Dynamics   Good Support System? Yes   Frank [name] has his wife, children and grandchildren for support     Barriers   Psychosocial barriers to participate in program The patient should benefit from training in stress management and relaxation.      Screening Interventions   Interventions Encouraged to exercise             Quality of Life Scores:  Quality of Life - 05/24/22 1042       Quality of Life   Select Quality of Life      Quality of Life Scores   Health/Function Pre 25.2 %    Socioeconomic Pre 27.56 %    Psych/Spiritual Pre 29.14 %    Family Pre 24.9 %    GLOBAL Pre 26.49 %            Scores of 19 and below usually indicate a poorer quality of life in these areas.  A difference of  2-3 points is a clinically meaningful difference.  A difference of 2-3 points in the total score of the Quality of Life Index has been  associated with significant improvement in overall quality of life, self-image, physical symptoms, and general health in studies assessing change in quality of life.  PHQ-9: Review Flowsheet  More data exists      05/24/2022 02/16/2022 06/23/2021 05/11/2021 09/14/2020  Depression screen PHQ 2/9  Decreased Interest 0 0 0 0 0  Down, Depressed, Hopeless 0 0 0 0 0  PHQ - 2 Score 0 0 0 0 0   Interpretation of Total Score  Total Score Depression Severity:  1-4 = Minimal depression, 5-9 = Mild depression, 10-14 = Moderate depression, 15-19 = Moderately severe depression, 20-27 = Severe depression   Psychosocial Evaluation and Intervention:   Psychosocial Re-Evaluation:  Psychosocial Re-Evaluation     Frank [name] Name 05/28/22 [phone] 06/20/22 0828 07/17/22 1234         Psychosocial Re-Evaluation   Current issues with None Identified None Identified None Identified     Comments -- -- Frank [name] will complete intensive cardiac rehab on 07/18/22     Interventions Encouraged to attend Cardiac Rehabilitation for the exercise Encouraged to attend Cardiac Rehabilitation for the exercise Encouraged to attend Cardiac Rehabilitation for the exercise     Continue Psychosocial Services  No Follow up required No Follow up required No Follow up required              Psychosocial Discharge (Final Psychosocial Re-Evaluation):  Psychosocial Re-Evaluation - 07/17/22 1234       Psychosocial Re-Evaluation   Current issues with None Identified    Comments Frank [name] will complete intensive cardiac rehab on 07/18/22    Interventions Encouraged to attend Cardiac Rehabilitation for the exercise    Continue Psychosocial Services  No Follow up required             Vocational Rehabilitation: Provide vocational rehab assistance to qualifying candidates.  Vocational Rehab Evaluation & Intervention:  Vocational Rehab - 05/24/22 1004       Initial Vocational Rehab Evaluation & Intervention   Assessment shows  need for Vocational Rehabilitation No   Frank [name] is retired and does not need vocational rehab at this time            Education: Education Goals: Education classes will be provided on a weekly basis, covering required topics. Participant will state understanding/return demonstration of topics presented.    Education     Row Name 05/28/22 0900     Education   Cardiac Education Topics Pritikin   Select Workshops     Workshops   Educator Exercise Physiologist   Select Psychosocial   Psychosocial Workshop New Thoughts, New Behaviors   Instruction Review Code 1- Verbalizes Understanding   Class Start Time [phone]   Class Stop Time 0848   Class Time Calculation (min) 36 min    [place] Name 05/30/22 1100     Education   Cardiac Education Topics [employer]   Educator Dietitian   Weekly Topic One-Pot Wonders   Instruction Review Code 1- Verbalizes Understanding   Class Start Time 0815   Class Stop Time 0900   Class Time Calculation (min) 45 min    [place] Name 06/04/22 0900     Education   Cardiac Education Topics Pritikin   Select Core Videos     Core Videos   Educator Exercise Physiologist   Select Exercise Education   Exercise Education Biomechanial Limitations   Instruction Review Code 1- Verbalizes Understanding   Class Start Time 0810   Class Stop Time 0855   Class Time Calculation (min) 45 min    [place] Name 06/06/22 1100     Education   Cardiac Education Topics [employer]   Educator Dietitian   Weekly Topic Fast Evening Meals   Instruction Review Code 1- Verbalizes Understanding   Class Start Time 0815   Class Stop Time 0904   Class Time Calculation (min) 49 min    [place] Name 06/11/22 1000     Education   Cardiac Education Topics Pritikin   IT sales professional Nutrition   Nutrition Workshop Fueling a Designer, multimedia   Instruction  Review Code 1- Verbalizes Understanding   Class Start Time 0815   Class Stop Time 0904   Class Time Calculation (min) 49 min    [place] Name 06/13/22 0900     Education   Cardiac Education Topics [place]   Educator Dietitian   Weekly Topic Adding Flavor - Sodium-Free   Instruction Review Code 1- Verbalizes Understanding   Class Start Time 0815   Class Stop Time 0857   Class Time Calculation (min) 42 min    [place] Name 06/18/22 0900     Education   Cardiac Education Topics Pritikin   Select Workshops     Workshops   Educator Exercise Physiologist   Select Psychosocial   Psychosocial Workshop Recognizing and Reducing Stress   Instruction Review Code 1- Verbalizes Understanding   Class Start Time 0810   Class Stop Time 0904   Class Time Calculation (min) 54 min    [place] Name 06/20/22 0900     Education   Cardiac Education Topics Pritikin   Select  Heritage manager   Weekly Topic Fast and Healthy Breakfasts   Instruction Review Code 1- Verbalizes Understanding   Class Start Time 0815   Class Stop Time [id number]   Class Time Calculation (min) 50 min    [place] Name 06/25/22 0900     Education   Cardiac Education Topics Pritikin   Select Workshops     Workshops   Educator Exercise Physiologist   Select Exercise   Exercise Workshop Hotel manager and Fall Prevention   Instruction Review Code 1- Verbalizes Understanding   Class Start Time [phone]   Class Stop Time 0900   Class Time Calculation (min) 50 min    [place] Name 06/27/22 1000     Education   Cardiac Education Topics [employer]   Educator Dietitian   Weekly Topic Personalizing Your Pritikin Plate   Instruction Review Code 1- Verbalizes Understanding   Class Start Time 0815   Class Stop Time 0900   Class Time Calculation (min) 45 min    [place] Name 07/06/22 0900     Education   Cardiac  Education Topics Pritikin   Select Core Videos     Core Videos   Educator Dietitian   Select Nutrition   Nutrition Other   Instruction Review Code 1- Verbalizes Understanding   Class Start Time [phone]   Class Stop Time 0856   Class Time Calculation (min) 44 min    [place] Name 07/09/22 1000     Education   Cardiac Education Topics Pritikin   Select Core Videos     Core Videos   Educator Dietitian   Select Nutrition   Nutrition Calorie Density   Instruction Review Code 1- Verbalizes Understanding   Class Start Time 0810   Class Stop Time 0856   Class Time Calculation (min) 46 min    [place] Name 07/11/22 0900     Education   Cardiac Education Topics [employer]   Educator Dietitian   Weekly Topic [employer] Desserts   Instruction Review Code 1- Verbalizes Understanding   Class Start Time [phone]   Class Stop Time 0843   Class Time Calculation (min) 31 min    [place] Name 07/16/22 0900     Education   Cardiac Education Topics Pritikin   Select Workshops     Workshops   Educator Exercise Physiologist   Select Exercise   Exercise Workshop Exercise Basics: Building Your Action Plan;Exercise Biomechanics   Instruction Review Code 1- Verbalizes Understanding   Class Start Time [phone]   Class Stop Time 0855   Class Time Calculation (min) 43 min            Core Videos: Exercise    Move It!  Clinical staff conducted group or individual video education with verbal and written material and guidebook.  Patient learns the recommended Pritikin exercise program. Exercise with the goal of living a long, healthy life. Some of the health benefits of exercise include controlled diabetes, healthier blood pressure levels, improved cholesterol levels, improved heart and lung capacity, improved sleep, and better body composition. Everyone should speak with their doctor before starting or changing an exercise routine.  Biomechanical Limitations Clinical  staff conducted group or individual video education with verbal and written material and guidebook.  Patient learns how biomechanical limitations can impact exercise and how we can mitigate and  possibly overcome limitations to have an impactful and balanced exercise routine.  Body Composition Clinical staff conducted group or individual video education with verbal and written material and guidebook.  Patient learns that body composition (ratio of muscle mass to fat mass) is a key component to assessing overall fitness, rather than body weight alone. Increased fat mass, especially visceral belly fat, can put [place] at increased risk for metabolic syndrome, type 2 diabetes, heart disease, and even death. It is recommended to combine diet and exercise (cardiovascular and resistance training) to improve your body composition. Seek guidance from your physician and exercise physiologist before implementing an exercise routine.  Exercise Action Plan Clinical staff conducted group or individual video education with verbal and written material and guidebook.  Patient learns the recommended strategies to achieve and enjoy long-term exercise adherence, including variety, self-motivation, self-efficacy, and positive decision making. Benefits of exercise include fitness, good health, weight management, more energy, better sleep, less stress, and overall well-being.  Medical   Heart Disease Risk Reduction Clinical staff conducted group or individual video education with verbal and written material and guidebook.  Patient learns our heart is our most vital organ as it circulates oxygen, nutrients, white blood cells, and hormones throughout the entire body, and carries waste away. Data supports a plant-based eating plan like the Pritikin Program for its effectiveness in slowing progression of and reversing heart disease. The video provides a number of recommendations to address heart disease.   Metabolic Syndrome and  Belly Fat  Clinical staff conducted group or individual video education with verbal and written material and guidebook.  Patient learns what metabolic syndrome is, how it leads to heart disease, and how one can reverse it and keep it from coming back. You have metabolic syndrome if you have 3 of the following 5 criteria: abdominal obesity, high blood pressure, high triglycerides, low HDL cholesterol, and high blood sugar.  Hypertension and Heart Disease Clinical staff conducted group or individual video education with verbal and written material and guidebook.  Patient learns that high blood pressure, or hypertension, is very common in the [place]. Hypertension is largely due to excessive salt intake, but other important risk factors include being overweight, physical inactivity, drinking too much alcohol, smoking, and not eating enough potassium from fruits and vegetables. High blood pressure is a leading risk factor for heart attack, stroke, congestive heart failure, dementia, kidney failure, and premature death. Long-term effects of excessive salt intake include stiffening of the arteries and thickening of heart muscle and organ damage. Recommendations include ways to reduce hypertension and the risk of heart disease.  Diseases of Our Time - Focusing on Diabetes Clinical staff conducted group or individual video education with verbal and written material and guidebook.  Patient learns why the best way to stop diseases of our time is prevention, through food and other lifestyle changes. Medicine (such as prescription pills and surgeries) is often only a Band-Aid on the problem, not a long-term solution. Most common diseases of our time include obesity, type 2 diabetes, hypertension, heart disease, and cancer. The Pritikin Program is recommended and has been proven to help reduce, reverse, and/or prevent the damaging effects of metabolic syndrome.  Nutrition   Overview of the Pritikin Eating Plan   Clinical staff conducted group or individual video education with verbal and written material and guidebook.  Patient learns about the [place] for disease risk reduction. The [place] emphasizes a wide variety of unrefined, minimally-processed carbohydrates, like fruits,  vegetables, whole grains, and legumes. Go, Caution, and Stop food choices are explained. Plant-based and lean animal proteins are emphasized. Rationale provided for low sodium intake for blood pressure control, low added sugars for blood sugar stabilization, and low added fats and oils for coronary artery disease risk reduction and weight management.  Calorie Density  Clinical staff conducted group or individual video education with verbal and written material and guidebook.  Patient learns about calorie density and how it impacts the Pritikin Eating Plan. Knowing the characteristics of the food you choose will help you decide whether those foods will lead to weight gain or weight loss, and whether you want to consume more or less of them. Weight loss is usually a side effect of the Pritikin Eating Plan because of its focus on low calorie-dense foods.  Label Reading  Clinical staff conducted group or individual video education with verbal and written material and guidebook.  Patient learns about the Pritikin recommended label reading guidelines and corresponding recommendations regarding calorie density, added sugars, sodium content, and whole grains.  Dining Out - Part 1  Clinical staff conducted group or individual video education with verbal and written material and guidebook.  Patient learns that restaurant meals can be sabotaging because they can be so high in calories, fat, sodium, and/or sugar. Patient learns recommended strategies on how to positively address this and avoid unhealthy pitfalls.  Facts on Fats  Clinical staff conducted group or individual video education with verbal and written  material and guidebook.  Patient learns that lifestyle modifications can be just as effective, if not more so, as many medications for lowering your risk of heart disease. A Pritikin lifestyle can help to reduce your risk of inflammation and atherosclerosis (cholesterol build-up, or plaque, in the artery walls). Lifestyle interventions such as dietary choices and physical activity address the cause of atherosclerosis. A review of the types of fats and their impact on blood cholesterol levels, along with dietary recommendations to reduce fat intake is also included.  Nutrition Action Plan  Clinical staff conducted group or individual video education with verbal and written material and guidebook.  Patient learns how to incorporate Pritikin recommendations into their lifestyle. Recommendations include planning and keeping personal health goals in mind as an important part of their success.  Healthy Mind-Set    Healthy Minds, Bodies, Hearts  Clinical staff conducted group or individual video education with verbal and written material and guidebook.  Patient learns how to identify when they are stressed. Video will discuss the impact of that stress, as well as the many benefits of stress management. Patient will also be introduced to stress management techniques. The way we think, act, and feel has an impact on our hearts.  How Our Thoughts Can Heal Our Hearts  Clinical staff conducted group or individual video education with verbal and written material and guidebook.  Patient learns that negative thoughts can cause depression and anxiety. This can result in negative lifestyle behavior and serious health problems. Cognitive behavioral therapy is an effective method to help control our thoughts in order to change and improve our emotional outlook.  Additional Videos:  Exercise    Improving Performance  Clinical staff conducted group or individual video education with verbal and written material and  guidebook.  Patient learns to use a non-linear approach by alternating intensity levels and lengths of time spent exercising to help burn more calories and lose more body fat. Cardiovascular exercise helps improve heart health, metabolism, hormonal balance, blood sugar  control, and recovery from fatigue. Resistance training improves strength, endurance, balance, coordination, reaction time, metabolism, and muscle mass. Flexibility exercise improves circulation, posture, and balance. Seek guidance from your physician and exercise physiologist before implementing an exercise routine and learn your capabilities and proper form for all exercise.  Introduction to Yoga  Clinical staff conducted group or individual video education with verbal and written material and guidebook.  Patient learns about yoga, a discipline of the coming together of mind, breath, and body. The benefits of yoga include improved flexibility, improved range of motion, better posture and core strength, increased lung function, weight loss, and positive self-image. Yoga's heart health benefits include lowered blood pressure, healthier heart rate, decreased cholesterol and triglyceride levels, improved immune function, and reduced stress. Seek guidance from your physician and exercise physiologist before implementing an exercise routine and learn your capabilities and proper form for all exercise.  Medical   Aging: Enhancing Your Quality of Life  Clinical staff conducted group or individual video education with verbal and written material and guidebook.  Patient learns key strategies and recommendations to stay in good physical health and enhance quality of life, such as prevention strategies, having an advocate, securing a [place], and keeping a list of medications and system for tracking them. It also discusses how to avoid risk for bone loss.  Biology of Weight Control  Clinical staff conducted group or  individual video education with verbal and written material and guidebook.  Patient learns that weight gain occurs because we consume more calories than we burn (eating more, moving less). Even if your body weight is normal, you may have higher ratios of fat compared to muscle mass. Too much body fat puts you at increased risk for cardiovascular disease, heart attack, stroke, type 2 diabetes, and obesity-related cancers. In addition to exercise, following the [place] can help reduce your risk.  Decoding Lab Results  Clinical staff conducted group or individual video education with verbal and written material and guidebook.  Patient learns that lab test reflects one measurement whose values change over time and are influenced by many factors, including medication, stress, sleep, exercise, food, hydration, pre-existing medical conditions, and more. It is recommended to use the knowledge from this video to become more involved with your lab results and evaluate your numbers to speak with your doctor.   Diseases of Our Time - Overview  Clinical staff conducted group or individual video education with verbal and written material and guidebook.  Patient learns that according to the CDC, 50% to 70% of chronic diseases (such as obesity, type 2 diabetes, elevated lipids, hypertension, and heart disease) are avoidable through lifestyle improvements including healthier food choices, listening to satiety cues, and increased physical activity.  Sleep Disorders Clinical staff conducted group or individual video education with verbal and written material and guidebook.  Patient learns how good quality and duration of sleep are important to overall health and well-being. Patient also learns about sleep disorders and how they impact health along with recommendations to address them, including discussing with a physician.  Nutrition  Dining Out - Part 2 Clinical staff conducted group or individual video  education with verbal and written material and guidebook.  Patient learns how to plan ahead and communicate in order to maximize their dining experience in a healthy and nutritious manner. Included are recommended food choices based on the type of restaurant the patient is visiting.   Fueling a IT trainer  staff conducted group or individual video education with verbal and written material and guidebook.  There is a strong connection between our food choices and our health. Diseases like obesity and type 2 diabetes are very prevalent and are in large-part due to lifestyle choices. The Pritikin Eating Plan provides plenty of food and hunger-curbing satisfaction. It is easy to follow, affordable, and helps reduce health risks.  Menu Workshop  Clinical staff conducted group or individual video education with verbal and written material and guidebook.  Patient learns that restaurant meals can sabotage health goals because they are often packed with calories, fat, sodium, and sugar. Recommendations include strategies to plan ahead and to communicate with the manager, chef, or server to help order a healthier meal.  Planning Your Eating Strategy  Clinical staff conducted group or individual video education with verbal and written material and guidebook.  Patient learns about the [place] and its benefit of reducing the risk of disease. The [place] does not focus on calories. Instead, it emphasizes high-quality, nutrient-rich foods. By knowing the characteristics of the foods, we choose, we can determine their calorie density and make informed decisions.  Targeting Your Nutrition Priorities  Clinical staff conducted group or individual video education with verbal and written material and guidebook.  Patient learns that lifestyle habits have a tremendous impact on disease risk and progression. This video provides eating and physical activity recommendations based on your  personal health goals, such as reducing LDL cholesterol, losing weight, preventing or controlling type 2 diabetes, and reducing high blood pressure.  Vitamins and Minerals  Clinical staff conducted group or individual video education with verbal and written material and guidebook.  Patient learns different ways to obtain key vitamins and minerals, including through a recommended healthy diet. It is important to discuss all supplements you take with your doctor.   Healthy Mind-Set    Smoking Cessation  Clinical staff conducted group or individual video education with verbal and written material and guidebook.  Patient learns that cigarette smoking and tobacco addiction pose a serious health risk which affects millions of people. Stopping smoking will significantly reduce the risk of heart disease, lung disease, and many forms of cancer. Recommended strategies for quitting are covered, including working with your doctor to develop a successful plan.  Culinary   Becoming a Financial trader conducted group or individual video education with verbal and written material and guidebook.  Patient learns that cooking at home can be healthy, cost-effective, quick, and puts them in control. Keys to cooking healthy recipes will include looking at your recipe, assessing your equipment needs, planning ahead, making it simple, choosing cost-effective seasonal ingredients, and limiting the use of added fats, salts, and sugars.  Cooking - Breakfast and Snacks  Clinical staff conducted group or individual video education with verbal and written material and guidebook.  Patient learns how important breakfast is to satiety and nutrition through the entire day. Recommendations include key foods to eat during breakfast to help stabilize blood sugar levels and to prevent overeating at meals later in the day. Planning ahead is also a key component.  Cooking - Human resources officer conducted  group or individual video education with verbal and written material and guidebook.  Patient learns eating strategies to improve overall health, including an approach to cook more at home. Recommendations include thinking of animal protein as a side on your plate rather than center stage and focusing instead on lower calorie  dense options like vegetables, fruits, whole grains, and plant-based proteins, such as beans. Making sauces in large quantities to freeze for later and leaving the skin on your vegetables are also recommended to maximize your experience.  Cooking - Healthy Salads and Dressing Clinical staff conducted group or individual video education with verbal and written material and guidebook.  Patient learns that vegetables, fruits, whole grains, and legumes are the foundations of the [place]. Recommendations include how to incorporate each of these in flavorful and healthy salads, and how to create homemade salad dressings. Proper handling of ingredients is also covered. Cooking - Soups and [employer] - Soups and Desserts Clinical staff conducted group or individual video education with verbal and written material and guidebook.  Patient learns that Pritikin soups and desserts make for easy, nutritious, and delicious snacks and meal components that are low in sodium, fat, sugar, and calorie density, while high in vitamins, minerals, and filling fiber. Recommendations include simple and healthy ideas for soups and desserts.   Overview     The Pritikin Solution Program Overview Clinical staff conducted group or individual video education with verbal and written material and guidebook.  Patient learns that the results of the [place] Program have been documented in more than 100 articles published in peer-reviewed journals, and the benefits include reducing risk factors for (and, in some cases, even reversing) high cholesterol, high blood pressure, type 2 diabetes, obesity,  and more! An overview of the three key pillars of the Pritikin Program will be covered: eating well, doing regular exercise, and having a healthy mind-set.  WORKSHOPS  Exercise: Exercise Basics: Building Your Action Plan Clinical staff led group instruction and group discussion with PowerPoint presentation and patient guidebook. To enhance the learning environment the use of posters, models and videos may be added. At the conclusion of this workshop, patients will comprehend the difference between physical activity and exercise, as well as the benefits of incorporating both, into their routine. Patients will understand the FITT (Frequency, Intensity, Time, and Type) principle and how to use it to build an exercise action plan. In addition, safety concerns and other considerations for exercise and cardiac rehab will be addressed by the presenter. The purpose of this lesson is to promote a comprehensive and effective weekly exercise routine in order to improve patients' overall level of fitness.   Managing Heart Disease: Your Path to a Healthier Heart Clinical staff led group instruction and group discussion with PowerPoint presentation and patient guidebook. To enhance the learning environment the use of posters, models and videos may be added.At the conclusion of this workshop, patients will understand the anatomy and physiology of the heart. Additionally, they will understand how Pritikin's three pillars impact the risk factors, the progression, and the management of heart disease.  The purpose of this lesson is to provide a high-level overview of the heart, heart disease, and how the Pritikin lifestyle positively impacts risk factors.  Exercise Biomechanics Clinical staff led group instruction and group discussion with PowerPoint presentation and patient guidebook. To enhance the learning environment the use of posters, models and videos may be added. Patients will learn how the structural  parts of their bodies function and how these functions impact their daily activities, movement, and exercise. Patients will learn how to promote a neutral spine, learn how to manage pain, and identify ways to improve their physical movement in order to promote healthy living. The purpose of this lesson is to expose patients to common physical  limitations that impact physical activity. Participants will learn practical ways to adapt and manage aches and pains, and to minimize their effect on regular exercise. Patients will learn how to maintain good posture while sitting, walking, and lifting.  Balance Training and Fall Prevention  Clinical staff led group instruction and group discussion with PowerPoint presentation and patient guidebook. To enhance the learning environment the use of posters, models and videos may be added. At the conclusion of this workshop, patients will understand the importance of their sensorimotor skills (vision, proprioception, and the vestibular system) in maintaining their ability to balance as they age. Patients will apply a variety of balancing exercises that are appropriate for their current level of function. Patients will understand the common causes for poor balance, possible solutions to these problems, and ways to modify their physical environment in order to minimize their fall risk. The purpose of this lesson is to teach patients about the importance of maintaining balance as they age and ways to minimize their risk of falling.  WORKSHOPS   Nutrition:  Fueling a Scientist, research (physical sciences) led group instruction and group discussion with PowerPoint presentation and patient guidebook. To enhance the learning environment the use of posters, models and videos may be added. Patients will review the foundational principles of the [place] and understand what constitutes a serving size in each of the food groups. Patients will also learn Pritikin-friendly  foods that are better choices when away from home and review make-ahead meal and snack options. Calorie density will be reviewed and applied to three nutrition priorities: weight maintenance, weight loss, and weight gain. The purpose of this lesson is to reinforce (in a group setting) the key concepts around what patients are recommended to eat and how to apply these guidelines when away from home by planning and selecting Pritikin-friendly options. Patients will understand how calorie density may be adjusted for different weight management goals.  Mindful Eating  Clinical staff led group instruction and group discussion with PowerPoint presentation and patient guidebook. To enhance the learning environment the use of posters, models and videos may be added. Patients will briefly review the concepts of the [place] and the importance of low-calorie dense foods. The concept of mindful eating will be introduced as well as the importance of paying attention to internal hunger signals. Triggers for non-hunger eating and techniques for dealing with triggers will be explored. The purpose of this lesson is to provide patients with the opportunity to review the basic principles of the [place], discuss the value of eating mindfully and how to measure internal cues of hunger and fullness using the Hunger Scale. Patients will also discuss reasons for non-hunger eating and learn strategies to use for controlling emotional eating.  Targeting Your Nutrition Priorities Clinical staff led group instruction and group discussion with PowerPoint presentation and patient guidebook. To enhance the learning environment the use of posters, models and videos may be added. Patients will learn how to determine their genetic susceptibility to disease by reviewing their family history. Patients will gain insight into the importance of diet as part of an overall healthy lifestyle in mitigating the impact of  genetics and other environmental insults. The purpose of this lesson is to provide patients with the opportunity to assess their personal nutrition priorities by looking at their family history, their own health history and current risk factors. Patients will also be able to discuss ways of prioritizing and modifying the [place] for their  highest risk areas  Menu  Clinical staff led group instruction and group discussion with PowerPoint presentation and patient guidebook. To enhance the learning environment the use of posters, models and videos may be added. Using menus brought in from [employer], or printed from [employer], patients will apply the [place] dining out guidelines that were presented in the [employer] video. Patients will also be able to practice these guidelines in a variety of provided scenarios. The purpose of this lesson is to provide patients with the opportunity to practice hands-on learning of the [place] with actual menus and practice scenarios.  Label Reading Clinical staff led group instruction and group discussion with PowerPoint presentation and patient guidebook. To enhance the learning environment the use of posters, models and videos may be added. Patients will review and discuss the [facility] label reading guidelines presented in [facility]'s Label Reading Educational series video. Using fool labels brought in from local grocery stores and markets, patients will apply the label reading guidelines and determine if the packaged food meet the [facility] guidelines. The purpose of this lesson is to provide patients with the opportunity to review, discuss, and practice hands-on learning of the [facility] Label Reading guidelines with actual packaged food labels. [place] Workshops are designed to teach patients ways to prepare quick, simple, and affordable recipes at home. The importance of  nutrition's role in chronic disease risk reduction is reflected in its emphasis in the overall [facility] program. By learning how to prepare essential core [facility] Eating Plan recipes, patients will increase control over what they eat; be able to customize the flavor of foods without the use of added salt, sugar, or fat; and improve the quality of the food they consume. By learning a set of core recipes which are easily assembled, quickly prepared, and affordable, patients are more likely to prepare more healthy foods at home. These workshops focus on convenient breakfasts, simple entres, side dishes, and desserts which can be prepared with minimal effort and are consistent with nutrition recommendations for cardiovascular risk reduction. Cooking [employer] are taught by a Engineer, materials (RD) who has been trained by the [employer]. The chef or RD has a clear understanding of the importance of minimizing - if not completely eliminating - added fat, sugar, and sodium in recipes. Throughout the series of [place] Workshop sessions, patients will learn about healthy ingredients and efficient methods of cooking to build confidence in their capability to prepare    Cooking School weekly topics:  Adding Flavor- Sodium-Free  Fast and Healthy Breakfasts  Powerhouse Plant-Based Proteins  Satisfying Salads and Dressings  Simple Sides and Sauces  International Cuisine-Spotlight on the [place] Zones  Delicious Desserts  Savory Soups  Efficiency Cooking - Meals in a Snap  Tasty Appetizers and Snacks  Comforting Weekend Breakfasts  One-Pot Wonders   Fast Evening Meals  [place] (Psychosocial): New Thoughts, New Behaviors Clinical staff led group instruction and group discussion with PowerPoint presentation and patient guidebook. To enhance the learning environment the use of posters, models and  videos may be added. Patients will learn and practice techniques for developing effective health and lifestyle goals. Patients will be able to effectively apply the goal setting process learned to develop at least one new personal goal.  The purpose of this lesson is to expose patients to a new skill set of  behavior modification techniques such as techniques setting SMART goals, overcoming barriers, and achieving new thoughts and new behaviors.  Managing Moods and Relationships Clinical staff led group instruction and group discussion with PowerPoint presentation and patient guidebook. To enhance the learning environment the use of posters, models and videos may be added. Patients will learn how emotional and chronic stress factors can impact their health and relationships. They will learn healthy ways to manage their moods and utilize positive coping mechanisms. In addition, ICR patients will learn ways to improve communication skills. The purpose of this lesson is to expose patients to ways of understanding how one's mood and health are intimately connected. Developing a healthy outlook can help build positive relationships and connections with others. Patients will understand the importance of utilizing effective communication skills that include actively listening and being heard. They will learn and understand the importance of the "4 Cs" and especially Connections in fostering of a Healthy Mind-Set.  Healthy Sleep for a Healthy Heart Clinical staff led group instruction and group discussion with PowerPoint presentation and patient guidebook. To enhance the learning environment the use of posters, models and videos may be added. At the conclusion of this workshop, patients will be able to demonstrate knowledge of the importance of sleep to overall health, well-being, and quality of life. They will understand the symptoms of, and treatments for, common sleep disorders. Patients will also be able to  identify daytime and nighttime behaviors which impact sleep, and they will be able to apply these tools to help manage sleep-related challenges. The purpose of this lesson is to provide patients with a general overview of sleep and outline the importance of quality sleep. Patients will learn about a few of the most common sleep disorders. Patients will also be introduced to the concept of "sleep hygiene," and discover ways to self-manage certain sleeping problems through simple daily behavior changes. Finally, the workshop will motivate patients by clarifying the links between quality sleep and their goals of heart-healthy living.   Recognizing and Reducing Stress Clinical staff led group instruction and group discussion with PowerPoint presentation and patient guidebook. To enhance the learning environment the use of posters, models and videos may be added. At the conclusion of this workshop, patients will be able to understand the types of stress reactions, differentiate between acute and chronic stress, and recognize the impact that chronic stress has on their health. They will also be able to apply different coping mechanisms, such as reframing negative self-talk. Patients will have the opportunity to practice a variety of stress management techniques, such as deep abdominal breathing, progressive muscle relaxation, and/or guided imagery.  The purpose of this lesson is to educate patients on the role of stress in their lives and to provide healthy techniques for coping with it.  Learning Barriers/Preferences:  Learning Barriers/Preferences - 05/24/22 0944       Learning Barriers/Preferences   Learning Barriers Hearing    Learning Preferences Computer/Internet;Group Instruction;Individual Instruction;Pictoral;Skilled Demonstration;Written Material;Video             Education Topics:  Knowledge Questionnaire Score:  Knowledge Questionnaire Score - 05/24/22 0944       Knowledge  Questionnaire Score   Pre Score 22/24             Core Components/Risk Factors/Patient Goals at Admission:  Personal Goals and Risk Factors at Admission - 05/24/22 0945       Core Components/Risk Factors/Patient Goals on Admission    Weight Management Yes;Weight Loss  Intervention Weight Management: Develop a combined nutrition and exercise program designed to reach desired caloric intake, while maintaining appropriate intake of nutrient and fiber, sodium and fats, and appropriate energy expenditure required for the weight goal.;Weight Management: Provide education and appropriate resources to help participant work on and attain dietary goals.;Weight Management/Obesity: Establish reasonable short term and long term weight goals.;Obesity: Provide education and appropriate resources to help participant work on and attain dietary goals.    Admit Weight 189 lb (85.7 kg)    Goal Weight: Long Term 169 lb (76.7 kg)    Expected Outcomes Short Term: Continue to assess and modify interventions until short term weight is achieved;Long Term: Adherence to nutrition and physical activity/exercise program aimed toward attainment of established weight goal;Weight Loss: Understanding of general recommendations for a balanced deficit meal plan, which promotes 1-2 lb weight loss per week and includes a negative energy balance of [phone] kcal/d;Understanding recommendations for meals to include 15-35% energy as protein, 25-35% energy from fat, 35-60% energy from carbohydrates, less than 200mg  of dietary cholesterol, 20-35 gm of total fiber daily;Understanding of distribution of calorie intake throughout the day with the consumption of 4-5 meals/snacks    Hypertension Yes    Intervention Provide education on lifestyle modifcations including regular physical activity/exercise, weight management, moderate sodium restriction and increased consumption of fresh fruit, vegetables, and low fat dairy, alcohol moderation,  and smoking cessation.;Monitor prescription use compliance.    Expected Outcomes Short Term: Continued assessment and intervention until BP is < 140/57mm HG in hypertensive participants. < 130/64mm HG in hypertensive participants with diabetes, heart failure or chronic kidney disease.;Long Term: Maintenance of blood pressure at goal levels.    Lipids Yes    Intervention Provide education and support for participant on nutrition & aerobic/resistive exercise along with prescribed medications to achieve LDL 70mg , HDL >40mg .    Expected Outcomes Short Term: Participant states understanding of desired cholesterol values and is compliant with medications prescribed. Participant is following exercise prescription and nutrition guidelines.;Long Term: Cholesterol controlled with medications as prescribed, with individualized exercise RX and with personalized nutrition plan. Value goals: LDL < 70mg , HDL > 40 mg.             Core Components/Risk Factors/Patient Goals Review:   Goals and Risk Factor Review     Row Name 05/28/22 0948 06/20/22 0838 07/17/22 1235         Core Components/Risk Factors/Patient Goals Review   Personal Goals Review Weight Management/Obesity;Hypertension;Lipids Weight Management/Obesity;Hypertension;Lipids Weight Management/Obesity;Hypertension;Lipids     Review Frank [name] started intensive cardiac rehab on 05/28/22. Frank [name] did well with exercise for his fitness level as the patient is somewhat deconditoned Frank [name] has been doing well with exercise at [employer]. Vital signs have been stable. Frank [name] has lost 2.2 kg since starting the program. Frank [name] has been doing well with exercise at [employer]. Vital signs have been stable. Frank [name] has lost 6.9  kg since starting the program. Vital signs have been stable. Frank [name] will complete intensive cardiac rehab on 07/18/22     Expected Outcomes Frank [name] will continue to participate in intensive cardiac rehab for exercise, nutrition and lifestyle  modifications Frank [name] will continue to participate in intensive cardiac rehab for exercise, nutrition and lifestyle modifications Frank [name] will continue to  exercise,  follow nutrition and lifestyle modifications upon completion of intensive cardiac rehab.              Core Components/Risk Factors/Patient Goals at Discharge (Final Review):   Goals and Risk Factor Review - 07/17/22  1235       Core Components/Risk Factors/Patient Goals Review   Personal Goals Review Weight Management/Obesity;Hypertension;Lipids    Review Frank [name] has been doing well with exercise at [employer]. Vital signs have been stable. Frank [name] has lost 6.9  kg since starting the program. Vital signs have been stable. Frank [name] will complete intensive cardiac rehab on 07/18/22    Expected Outcomes Frank [name] will continue to  exercise,  follow nutrition and lifestyle modifications upon completion of intensive cardiac rehab.             ITP Comments:  ITP Comments     Row Name 05/24/22 1220 05/28/22 0935 06/20/22 0826 07/17/22 1232     ITP Comments Dr Fransico Him MD, Medical Director, Introduction to [facility] Education Program/ Intensive Cardiac Rehab. Initial PritikinOrientation Packet reviewed with the patient.and his wife. 30 Day ITP Review. Frank [name] started intensive cardiac rehab on 05/28/22 and did well with exercise. 30 Day ITP Review. Frank [name] has good attendnace and participation in intensive cardiac rehab 30 Day ITP Review. Frank [name] has good attendnace and participation in intensive cardiac rehab. Frank [name] will complete intensive cardiac rehab on 07/18/22.             Comments: See ITP comments. Frank [name] will complete intensive cardiac rehab on 07/18/22.Harrell Gave RN BSN

## 2022-07-18 ENCOUNTER — Encounter (HOSPITAL_COMMUNITY)
Admission: RE | Admit: 2022-07-18 | Discharge: 2022-07-18 | Disposition: A | Discharge status: Home / Self Care | Payer: Medicare Other | Source: Ambulatory Visit | Attending: Internal Medicine | Admitting: Internal Medicine

## 2022-07-18 VITALS — Ht 62.25 in | Wt 173.7 lb

## 2022-07-18 DIAGNOSIS — Z9889 Other specified postprocedural states: Secondary | ICD-10-CM | POA: Diagnosis not present

## 2022-07-18 DIAGNOSIS — Z5189 Encounter for other specified aftercare: Secondary | ICD-10-CM | POA: Diagnosis not present

## 2022-07-23 ENCOUNTER — Telehealth: Payer: Self-pay | Admitting: Internal Medicine

## 2022-07-23 ENCOUNTER — Encounter (HOSPITAL_COMMUNITY)
Admission: RE | Admit: 2022-07-23 | Discharge: 2022-07-23 | Disposition: A | Discharge status: Home / Self Care | Payer: Medicare Other | Source: Ambulatory Visit | Attending: Internal Medicine | Admitting: Internal Medicine

## 2022-07-23 ENCOUNTER — Encounter: Payer: Self-pay | Admitting: *Deleted

## 2022-07-23 DIAGNOSIS — Z9889 Other specified postprocedural states: Secondary | ICD-10-CM

## 2022-07-23 DIAGNOSIS — Z5189 Encounter for other specified aftercare: Secondary | ICD-10-CM | POA: Diagnosis not present

## 2022-07-23 NOTE — Progress Notes (Addendum)
Increased couplets noted today towards the end of exercise. Patient asymptomatic. Vital signs stable. Lily Kocher NP notified. Will send today's ECG tracings to Dr Dara Hoyer office for review and notify the office per Xica's request.Will fax exercise flow sheets to Dr. Dara Hoyer office for review Harrell Gave RN BSN

## 2022-07-23 NOTE — Progress Notes (Signed)
Discharge Progress Report  Patient Details  Name: Frank [name] MRN: [id number] Date of Birth: 11-10-1950 Referring Provider:   Flowsheet Row INTENSIVE CARDIAC REHAB ORIENT from 05/24/2022 in [place]  Referring Provider Dr [name], MD        Number of Visits: 17  Reason for Discharge:  Patient reached a stable level of exercise. Patient independent in their exercise. Patient has met program and personal goals.  Smoking History:  Social History   Tobacco Use  Smoking Status Former  Smokeless Tobacco Never  Tobacco Comments   quit in 1972    Diagnosis:  01/02/22 S/P mitral valve repair at [facility] Dr [name]  ADL [facility]:   Initial Exercise Prescription:  Initial Exercise Prescription - 05/24/22 1000       Date of Initial Exercise RX and Referring Provider   Date 05/24/22    Referring Provider Dr [name], MD    Expected Discharge Date 07/27/22      Recumbant Bike   Level 1.5    RPM 40    Minutes 15    METs 2.3      NuStep   Level 2    SPM 70    Minutes 15    METs 2.3      Prescription Details   Frequency (times per week) 2    Duration Progress to 30 minutes of continuous aerobic without signs/symptoms of physical distress      Intensity   THRR 40-80% of Max Heartrate 60-120    Ratings of Perceived Exertion 11-13    Perceived Dyspnea 0-4      Progression   Progression Continue progressive overload as per policy without signs/symptoms or physical distress.      Resistance Training   Training Prescription Yes    Weight 3    Reps 10-15             Discharge Exercise Prescription (Final Exercise Prescription Changes):  Exercise Prescription Changes - 07/23/22 1500       Response to Exercise   Blood Pressure (Admit) 128/72    Blood Pressure (Exercise) 148/84    Blood Pressure (Exit) 118/72    Heart Rate (Admit) 72 bpm    Heart Rate (Exercise) 84 bpm    Heart Rate (Exit) 72 bpm    Rating of  Perceived Exertion (Exercise) 12    Symptoms None    Comments Pt graduated from the CRP2 program today    Duration Continue with 30 min of aerobic exercise without signs/symptoms of physical distress.    Intensity THRR unchanged      Progression   Progression Continue to progress workloads to maintain intensity without signs/symptoms of physical distress.    Average METs 3.5      Resistance Training   Training Prescription Yes    Weight 5 lbs    Reps 10-15    Time 10 Minutes      Interval Training   Interval Training No      NuStep   Level 4    Minutes 15    METs 3.5      Home Exercise Plan   Plans to continue exercise at Home (comment)    Frequency Add 3 additional days to program exercise sessions.    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 L knee 4/10. bilateral hip pain, 2/10.    Resting HR 102 bpm 87 bpm    Resting BP 122/88 122/70    Resting Oxygen Saturation  97 % 99 %    Exercise Oxygen Saturation  during 6 min walk 99 % 98 %    Max Ex. HR 126 bpm 118 bpm    Max Ex. BP 154/80 130/64    2 Minute Post BP 128/78 118/78             Psychological, QOL, Others - Outcomes: PHQ 2/9:    07/23/2022    9:50 AM 05/24/2022   10:04 AM 02/16/2022    1:04 PM 06/23/2021    1:09 PM 05/11/2021    8:59 AM  Depression screen PHQ 2/9  Decreased Interest 0 0 0 0 0  Down, Depressed, Hopeless 0 0 0 0 0  PHQ - 2 Score 0 0 0 0 0    Quality of Life:  Quality of Life - 07/18/22 0824        Quality of Life   Select Quality of Life      Quality of Life Scores   Health/Function Pre 25.2 %    Health/Function Post 27.9 %    Health/Function % Change 10.71 %    Socioeconomic Pre 27.56 %    Socioeconomic Post 26.75 %    Socioeconomic % Change  -2.94 %    Psych/Spiritual Pre 29.14 %    Psych/Spiritual Post 25.14 %    Psych/Spiritual % Change -13.73 %    Family Pre 24.9 %    Family Post 24.4 %    Family % Change -2.01 %    GLOBAL Pre 26.49 %    GLOBAL Post 26.59 %    GLOBAL % Change 0.38 %             Personal Goals: Goals established at orientation with interventions provided to work toward goal.  Personal Goals and Risk Factors at Admission - 05/24/22 0945       Core Components/Risk Factors/Patient Goals on Admission    Weight Management Yes;Weight Loss    Intervention Weight Management: Develop a combined nutrition and exercise program designed to reach desired caloric intake, while maintaining appropriate intake of nutrient and fiber, sodium and fats, and appropriate energy expenditure required for the weight goal.;Weight Management: Provide education and appropriate resources to help participant work on and attain dietary goals.;Weight Management/Obesity: Establish reasonable short term and long term weight goals.;Obesity: Provide education and appropriate resources to help participant work on and attain dietary goals.    Admit Weight 189 lb (85.7 kg)    Goal Weight: Long Term 169 lb (76.7 kg)    Expected Outcomes Short Term: Continue to assess and modify interventions until short term weight is achieved;Long Term: Adherence to nutrition and physical activity/exercise program aimed toward attainment of established weight goal;Weight Loss:  Understanding of general recommendations for a balanced deficit meal plan, which promotes 1-2 lb weight loss per week and includes a negative energy balance of [id number] kcal/d;Understanding recommendations for meals to include  15-35% energy as protein, 25-35% energy from fat, 35-60% energy from carbohydrates, less than 244m of dietary cholesterol, 20-35 gm of total fiber daily;Understanding of distribution of calorie intake throughout the day with the consumption of 4-5 meals/snacks    Hypertension Yes    Intervention Provide education on lifestyle modifcations including regular physical activity/exercise, weight management, moderate sodium restriction and increased consumption of fresh fruit, vegetables, and low fat dairy, alcohol moderation, and smoking cessation.;Monitor prescription use compliance.    Expected Outcomes Short Term: Continued assessment and intervention until BP is < 140/991mHG in hypertensive participants. < 130/8028mG in hypertensive participants with diabetes, heart failure or chronic kidney disease.;Long Term: Maintenance of blood pressure at goal levels.    Lipids Yes    Intervention Provide education and support for participant on nutrition & aerobic/resistive exercise along with prescribed medications to achieve LDL <59m44mDL >40mg40m Expected Outcomes Short Term: Participant states understanding of desired cholesterol values and is compliant with medications prescribed. Participant is following exercise prescription and nutrition guidelines.;Long Term: Cholesterol controlled with medications as prescribed, with individualized exercise RX and with personalized nutrition plan. Value goals: LDL < 59mg,5m > 40 mg.              Personal Goals Discharge:  Goals and Risk Factor Review     Row Name 05/28/22 0948 06/20/22 0838 07/17/22 1235         Core Components/Risk Factors/Patient Goals Review   Personal Goals Review Weight Management/Obesity;Hypertension;Lipids Weight Management/Obesity;Hypertension;Lipids Weight Management/Obesity;Hypertension;Lipids     Review Charlie started intensive cardiac rehab on 05/28/22. Charlie did well with exercise for his fitness level as the patient is  somewhat deconditoned CharliEduard Closeen doing well with exercise at [facility]. [employer] signs have been stable. CharliEduard Closost 2.2 kg since starting the program. CharliEduard Closeen doing well with exercise at [facility]. [employer] signs have been stable. CharliEduard Closost 6.9  kg since starting the program. Vital signs have been stable. CharliEduard Closcomplete intensive cardiac rehab on 07/18/22     Expected Outcomes Charle will continue to participate in intensive cardiac rehab for exercise, nutrition and lifestyle modifications Charle will continue to participate in intensive cardiac rehab for exercise, nutrition and lifestyle modifications Charle will continue to  exercise,  follow nutrition and lifestyle modifications upon completion of intensive cardiac rehab.              Exercise Goals and Review:  Exercise Goals     Row Name 05/24/22 1035             Exercise Goals   Increase Physical Activity Yes       Intervention Provide advice, education, support and counseling about physical activity/exercise needs.;Develop an individualized exercise prescription for aerobic and resistive training based on initial evaluation findings, risk stratification, comorbidities and participant's personal goals.       Expected Outcomes Short Term: Attend rehab on a regular basis to increase amount of physical activity.;Long Term: Add in home exercise to make exercise part of routine and to increase amount of physical activity.;Long Term: Exercising regularly at least 3-5 days a week.       Increase Strength and Stamina Yes       Intervention Provide advice, education, support and counseling about physical activity/exercise  needs.;Develop an individualized exercise prescription for aerobic and resistive training based on initial evaluation findings, risk stratification, comorbidities and participant's personal goals.       Expected Outcomes Short Term: Increase workloads from initial exercise prescription for resistance, speed,  and METs.;Short Term: Perform resistance training exercises routinely during rehab and add in resistance training at home;Long Term: Improve cardiorespiratory fitness, muscular endurance and strength as measured by increased METs and functional capacity (6MWT)       Able to understand and use rate of perceived exertion (RPE) scale Yes       Intervention Provide education and explanation on how to use RPE scale       Expected Outcomes Short Term: Able to use RPE daily in rehab to express subjective intensity level;Long Term:  Able to use RPE to guide intensity level when exercising independently       Knowledge and understanding of Target Heart Rate Range (THRR) Yes       Intervention Provide education and explanation of THRR including how the numbers were predicted and where they are located for reference       Expected Outcomes Short Term: Able to state/look up THRR;Short Term: Able to use daily as guideline for intensity in rehab;Long Term: Able to use THRR to govern intensity when exercising independently       Understanding of Exercise Prescription Yes       Intervention Provide education, explanation, and written materials on patient's individual exercise prescription       Expected Outcomes Short Term: Able to explain program exercise prescription;Long Term: Able to explain home exercise prescription to exercise independently                Exercise Goals Re-Evaluation:  Exercise Goals Re-Evaluation     Row Name 05/28/22 1527 06/27/22 1414 07/23/22 1542         Exercise Goal Re-Evaluation   Exercise Goals Review Increase Physical Activity;Increase Strength and Stamina;Able to understand and use rate of perceived exertion (RPE) scale;Knowledge and understanding of Target Heart Rate Range (THRR);Understanding of Exercise Prescription Increase Physical Activity;Increase Strength and Stamina;Able to understand and use rate of perceived exertion (RPE) scale;Knowledge and understanding of  Target Heart Rate Range (THRR);Understanding of Exercise Prescription Increase Physical Activity;Increase Strength and Stamina;Able to understand and use rate of perceived exertion (RPE) scale;Knowledge and understanding of Target Heart Rate Range (THRR);Understanding of Exercise Prescription     Comments Pt's first day in the CRP2 program. Pr understands the Exercise Rx, RPE scale and THRR. Reviewed METs, goals and home exercise Rx. Pt has used his air dyne bike at home a few times. Pt will begin to use it 2-3 x/week and build up 30 minutes. Pt voices that his leg strength has improved and is walking better, especially after his knee injections. Pt also voices increased stamina. Pt graduated from the [place] program today. Pt had peak METs of 3.97. Pt made good progress in the program and plans to continue to exercise at home on his Air dyne bike. Pt voices improved overall strength and stamina and improved leg strength.     Expected Outcomes Will continue to monitor patient progress ans increase exercise workloads as tolerated. Pt will begin to use his Air Dyne bike at home 2-3 x/week, working up to 30 minutes. Pt will continue to exercise at home on his bike.              Nutrition & Weight - Outcomes:  Pre Biometrics - 05/24/22  9417       Pre Biometrics   Height 5' 2.25" (1.581 m)    Weight 85.8 kg    Waist Circumference 43.5 inches    Hip Circumference 42.25 inches    Waist to Hip Ratio 1.03 %    BMI (Calculated) 34.33    Triceps Skinfold 17 mm    % Body Fat 33.1 %    Grip Strength 34 kg    Flexibility 13 in    Single Leg Stand 8.5 seconds             Post Biometrics - 07/18/22 1142        Post  Biometrics   Height 5' 2.25" (1.581 m)    Weight 78.8 kg    Waist Circumference 40.5 inches    Hip Circumference 39.25 inches    Waist to Hip Ratio 1.03 %    BMI (Calculated) 31.53    Triceps Skinfold 14 mm    % Body Fat 29.8 %    Grip Strength 40 kg    Flexibility 13.5 in     Single Leg Stand 8.78 seconds             Nutrition:  Nutrition Therapy & Goals - 06/29/22 1141       Nutrition Therapy   Diet Heart Healthy Diet    Drug/Food Interactions Statins/Certain Fruits      Personal Nutrition Goals   Nutrition Goal Patient to choose a daily variety of fruits, vegetables, whole grains, lean protein/plant protein, non fat dairy as part of heart healthy lifestyle.    Personal Goal #2 Patient to decrease sodium intake to <1544m per day    Personal Goal #3 Patient to identify and limit daily intake of saturated fat, trans fat, sodium, and refined carbohydrates.    Comments Goals in progress. CAndrushas started making some changes including implemented whole grains, decreased saturated fat, and increased awareness of sodium and sugar. He reports limited support with dietary changes from wife and MIL.      Intervention Plan   Intervention Prescribe, educate and counsel regarding individualized specific dietary modifications aiming towards targeted core components such as weight, hypertension, lipid management, diabetes, heart failure and other comorbidities.    Expected Outcomes Short Term Goal: Understand basic principles of dietary content, such as calories, fat, sodium, cholesterol and nutrients.;Long Term Goal: Adherence to prescribed nutrition plan.             Nutrition Discharge:  Nutrition Assessments - 07/31/22 0931       Rate Your Plate Scores   Pre Score --    Post Score 65             Education Questionnaire Score:  Knowledge Questionnaire Score - 07/18/22 0824       Knowledge Questionnaire Score   Post Score 23/24             Goals reviewed with patient; copy given to patient.Pt graduates from  Intensive/Traditional cardiac rehab program today with completion of  16  exercise and education sessions. Pt maintained good attendance and progressed nicely during their participation in rehab as evidenced by increased MET level.    Medication list reconciled. Repeat  PHQ score- 0 .  Pt has made significant lifestyle changes and should be commended for their success. Charlie achieved their goals during cardiac rehab.   Pt plans to continue exercise at home using his stationary bike. Charlie lost 7 kg since starting intensive cardiac rehab. Charlie increased  his distance on his post exercise walk test by 248 feet. We are proud of Charlie's progress and weight loss!Harrell Gave RN BSN

## 2022-07-23 NOTE — Telephone Encounter (Signed)
Strips received and printed.  Placed on cart for MD review upon return to office. 

## 2022-07-23 NOTE — Telephone Encounter (Signed)
Office would like a callback regarding pt's PVC [place]. Office will be faxing over scripts also. Please advise

## 2022-07-25 ENCOUNTER — Other Ambulatory Visit: Payer: Self-pay | Admitting: Family Medicine

## 2022-07-25 NOTE — Telephone Encounter (Signed)
Strips reviewed which demonstrate nonsustained ventricular tachycardia comprised of 2 PVCs as a couplet.  The patient was asymptomatic.  No further recommendations at this time.  Continue to monitor

## 2022-08-01 ENCOUNTER — Telehealth: Payer: Self-pay | Admitting: Family Medicine

## 2022-08-01 NOTE — Telephone Encounter (Signed)
Patient req CB next week 10-9/23

## 2022-08-07 ENCOUNTER — Other Ambulatory Visit: Payer: Self-pay | Admitting: Family Medicine

## 2022-08-13 ENCOUNTER — Telehealth: Payer: Self-pay | Admitting: Family Medicine

## 2022-08-13 NOTE — Telephone Encounter (Signed)
Spoke with patient spouse she state he will call back to sched AWV

## 2022-08-23 ENCOUNTER — Ambulatory Visit (INDEPENDENT_AMBULATORY_CARE_PROVIDER_SITE_OTHER): Payer: Medicare Other | Admitting: Family Medicine

## 2022-08-23 ENCOUNTER — Encounter: Payer: Self-pay | Admitting: Family Medicine

## 2022-08-23 ENCOUNTER — Ambulatory Visit (INDEPENDENT_AMBULATORY_CARE_PROVIDER_SITE_OTHER)
Admission: RE | Admit: 2022-08-23 | Discharge: 2022-08-23 | Disposition: A | Discharge status: Home / Self Care | Payer: Medicare Other | Source: Ambulatory Visit | Attending: Family Medicine | Admitting: Family Medicine

## 2022-08-23 VITALS — BP 124/75 | HR 77 | Temp 97.7°F | Ht 62.0 in | Wt 174.0 lb

## 2022-08-23 DIAGNOSIS — R0789 Other chest pain: Secondary | ICD-10-CM | POA: Diagnosis not present

## 2022-08-23 DIAGNOSIS — E785 Hyperlipidemia, unspecified: Secondary | ICD-10-CM | POA: Diagnosis not present

## 2022-08-23 DIAGNOSIS — I10 Essential (primary) hypertension: Secondary | ICD-10-CM | POA: Diagnosis not present

## 2022-08-23 DIAGNOSIS — E669 Obesity, unspecified: Secondary | ICD-10-CM

## 2022-08-23 DIAGNOSIS — R0781 Pleurodynia: Secondary | ICD-10-CM | POA: Diagnosis not present

## 2022-08-23 MED ORDER — CYCLOBENZAPRINE HCL 10 MG PO TABS: 10.0000 mg | ORAL_TABLET | Freq: Three times a day (TID) | ORAL | 0 refills | Status: DC | PRN

## 2022-08-23 MED ORDER — TRAMADOL HCL 50 MG PO TABS: 50.0000 mg | ORAL_TABLET | Freq: Three times a day (TID) | ORAL | 0 refills | Status: AC | PRN

## 2022-08-23 NOTE — Assessment & Plan Note (Signed)
He will come back soon for CPE and we will check lipids at that time.  He is on Crestor 5 mg daily.

## 2022-08-23 NOTE — Assessment & Plan Note (Signed)
He is down about 11 pounds over the last 6 months.  Congratulated the patient on lifestyle modifications.

## 2022-08-23 NOTE — Progress Notes (Signed)
   Frank [name] is a 71 y.o. male who presents today for an office visit.  Assessment/Plan:  New/Acute Problems: Chest Wall Pain Concern for rib fracture. Reproducible on exam. Will check xray to confirm.  We will start pain control with tramadol.  He can continue using Tylenol.  We discussed potential side effects of tramadol.  He also is having some muscle spasms and we will start Flexeril 10 mg 3 times daily as needed.  We discussed reasons to return to care.  Follow-up as needed.  Fall Mechanical in nature.  No further follow-up needed.  If this becomes a recurrent issue would consider referral to physical therapy.  Chronic Problems Addressed Today: Essential hypertension Blood pressure at goal on lisinopril-HCTZ 20-25 once daily.  Obese He is down about 11 pounds over the last 6 months.  Congratulated the patient on lifestyle modifications.  Dyslipidemia He will come back soon for CPE and we will check lipids at that time.  He is on Crestor 5 mg daily.     Subjective:  HPI:  Patient here with chest wall pain after a fall. Patient fell 6 days ago.  He was trying to cover up his firewood due to the rain and slipped and fell onto his right side. Since then he has had persistent right sided chest pain. Worse with deep breathing. Worse with certain motions. Feels like previous rib fractures. Worse with coughing and sneezing.        Objective:  Physical Exam: BP 124/75   Pulse 77   Temp 97.7 F (36.5 C) (Temporal)   Ht 5\' 2"  (1.575 m)   Wt 174 lb (78.9 kg)   SpO2 98%   BMI 31.83 kg/m   Wt Readings from Last 3 Encounters:  08/23/22 174 lb (78.9 kg)  07/18/22 173 lb 11.6 oz (78.8 kg)  06/18/22 180 lb 9.6 oz (81.9 kg)     Gen: No acute distress, resting comfortably CV: Regular rate and rhythm with no murmurs appreciated Pulm: Normal work of breathing, clear to auscultation bilaterally with no crackles, wheezes, or rhonchi MSK: Surgical scar present on right chest  wall. No other gross deformities. Right chest wall tender to palpation which reproduces his pain.  Neuro: Grossly normal, moves all extremities Psych: Normal affect and thought content      Frank [name] M. Jerline Pain, MD 08/23/2022 8:00 AM

## 2022-08-23 NOTE — Assessment & Plan Note (Signed)
Blood pressure at goal on lisinopril-HCTZ 20-25 once daily.

## 2022-08-23 NOTE — Patient Instructions (Signed)
It was very nice to see you today!  I think you may have a rib fracture.  We will check an x-ray to confirm this.  Please take the tramadol and Flexeril as needed.  Let me know if your symptoms worsen.  Take care, Dr Jerline Pain  PLEASE NOTE:  If you had any lab tests please let us know if you have not heard back within a few days. You may see your results on mychart before we have a chance to review them but we will give you a call once they are reviewed by [place]. If we ordered any referrals today, please let us know if you have not heard from their office within the next week.   Please try these tips to maintain a healthy lifestyle:  Eat at least 3 REAL meals and 1-2 snacks per day.  Aim for no more than 5 hours between eating.  If you eat breakfast, please do so within one hour of getting up.   Each meal should contain half fruits/vegetables, one quarter protein, and one quarter carbs (no bigger than a computer mouse)  Cut down on sweet beverages. This includes juice, soda, and sweet tea.   Drink at least 1 glass of water with each meal and aim for at least 8 glasses per day  Exercise at least 150 minutes every week.

## 2022-08-27 NOTE — Progress Notes (Signed)
Please inform patient of the following:  His x-ray shows old rib fractures but no new fractures.  Do not need to make any changes to treatment plan at this time.  Patient to let us know if his pain does not improve in the next few weeks.

## 2022-08-29 NOTE — Progress Notes (Signed)
He can use topical lidocaine patches and continue with his current medications for pain at this time.  Frank [name]. Jerline Pain, MD 08/29/2022 11:02 AM

## 2022-08-30 ENCOUNTER — Telehealth: Payer: Self-pay | Admitting: Family Medicine

## 2022-08-30 NOTE — Telephone Encounter (Signed)
Copied from [place] [id number]. Topic: Medicare AWV >> Aug 30, 2022  2:02 PM Devoria Glassing wrote: Reason for CRM: Left message for patient to schedule Annual Wellness Visit.  Please schedule with Nurse Health Advisor Charlott Rakes, RN at [facility]. This appt can be telephone or office visit. Please call [phone] ask for [facility]

## 2022-08-31 ENCOUNTER — Telehealth: Payer: Self-pay | Admitting: Family Medicine

## 2022-08-31 NOTE — Telephone Encounter (Signed)
Copied from CRM #[id number]. Topic: Medicare AWV >> Aug 30, 2022  2:02 PM Kathy H wrote: Reason for CRM: Left message for patient to schedule Annual Wellness Visit.  Please schedule with Nurse Health Advisor Tina Betterson, RN at [facility]. This appt can be telephone or office visit. Please call [phone] ask for Kathy 

## 2022-09-11 NOTE — Progress Notes (Deleted)
   I, Philbert Riser, LAT, ATC acting as a scribe for Clementeen Graham, MD.  Frank [name] is a 71 y.o. male who presents to [facility] at [facility] today for cont'd bilat knee pain. Pt was last seen by Dr. Denyse Amass on 06/18/22 and was given bilat Zilretta injection. Today, pt reports bilat knee pain returned?  Dx imaging: 04/10/11 R & L knee XR  06/11/21 R knee XR  03/14/20 R & L knee XR  04/29/17 R knee XR  Pertinent review of systems: ***  Relevant historical information: ***   Exam:  There were no vitals taken for this visit. General: Well Developed, well nourished, and in no acute distress.   MSK: ***    Lab and Radiology Results No results found for this or any previous visit (from the past 72 hour(s)). No results found.     Assessment and Plan: 71 y.o. male with ***   PDMP not reviewed this encounter. No orders of the defined types were placed in this encounter.  No orders of the defined types were placed in this encounter.    Discussed warning signs or symptoms. Please see discharge instructions. Patient expresses understanding.   ***

## 2022-09-12 ENCOUNTER — Ambulatory Visit: Payer: Medicare Other | Admitting: Family Medicine

## 2022-09-18 NOTE — Progress Notes (Unsigned)
   I, Frank [name], LAT, ATC acting as a scribe for Frank Miles, MD.  Frank [name] is a 71 y.o. male who presents to [facility] today for cont'd bilat knee pain. Pt was last seen by Dr. Taylen [name] on 06/18/22 and was given bilat Zilretta injection. Today, pt reports bilat knee pain returned?  Dx imaging: 04/10/11 R & L knee XR  06/11/21 R knee XR  03/14/20 R & L knee XR  04/29/17 R knee XR  Pertinent review of systems: ***  Relevant historical information: ***   Exam:  There were no vitals taken for this visit. General: Well Developed, well nourished, and in no acute distress.   MSK: ***    Lab and Radiology Results No results found for this or any previous visit (from the past 72 hour(s)). No results found.     Assessment and Plan: 71 y.o. male with ***   PDMP not reviewed this encounter. No orders of the defined types were placed in this encounter.  No orders of the defined types were placed in this encounter.    Discussed warning signs or symptoms. Please see discharge instructions. Patient expresses understanding.   ***  

## 2022-09-19 ENCOUNTER — Ambulatory Visit (INDEPENDENT_AMBULATORY_CARE_PROVIDER_SITE_OTHER): Payer: Medicare Other | Admitting: Family Medicine

## 2022-09-19 ENCOUNTER — Ambulatory Visit: Payer: Self-pay

## 2022-09-19 VITALS — BP 152/86 | HR 85 | Wt 176.0 lb

## 2022-09-19 DIAGNOSIS — M170 Bilateral primary osteoarthritis of knee: Secondary | ICD-10-CM

## 2022-09-19 DIAGNOSIS — M25562 Pain in left knee: Secondary | ICD-10-CM

## 2022-09-19 DIAGNOSIS — M25561 Pain in right knee: Secondary | ICD-10-CM

## 2022-09-19 DIAGNOSIS — G8929 Other chronic pain: Secondary | ICD-10-CM

## 2022-09-19 DIAGNOSIS — M7121 Synovial cyst of popliteal space [Baker], right knee: Secondary | ICD-10-CM

## 2022-09-19 MED ORDER — TRIAMCINOLONE ACETONIDE 32 MG IX SRER
64.0000 mg | Freq: Once | INTRA_ARTICULAR | Status: AC
  Administered 2022-09-19: 64 mg via INTRA_ARTICULAR

## 2022-09-19 NOTE — Patient Instructions (Signed)
Thank you for coming in today.   Call or go to the ER if you develop a large red swollen joint with extreme pain or oozing puss.    I can drain that cyst in the back of the knee if needed.

## 2022-09-23 ENCOUNTER — Other Ambulatory Visit: Payer: Self-pay | Admitting: Family Medicine

## 2022-09-30 ENCOUNTER — Other Ambulatory Visit: Payer: Self-pay | Admitting: Family Medicine

## 2022-10-04 ENCOUNTER — Other Ambulatory Visit: Payer: Self-pay | Admitting: Family Medicine

## 2022-10-04 NOTE — Progress Notes (Signed)
Office Visit    Patient Name: Frank [name] Date of Encounter: 10/04/2022  Primary Care Provider:  Ardith Dark, MD Primary Cardiologist:  Orbie Pyo, MD Primary Electrophysiologist: None  Chief Complaint    Frank [name] is a 71 y.o. male with PMH of MV regurgitation s/p MV repair 12/2021 HTN, HLD, asthma, family history of CAD tobacco abuse who presents today for 55-month follow-up.   Past Medical History    Past Medical History:  Diagnosis Date   Allergy    Asthma    Coronary artery disease    GERD (gastroesophageal reflux disease)    Headache(784.0)    Hypertension    Past Surgical History:  Procedure Laterality Date   CARDIAC CATHETERIZATION     HERNIA REPAIR  1969, 2009   kidney stones  05/20/1995   KNEE SURGERY  02/26/1989   NASAL SINUS SURGERY  10/24/1992   RIGHT/LEFT HEART CATH AND CORONARY ANGIOGRAPHY N/A 11/15/2021   Procedure: RIGHT/LEFT HEART CATH AND CORONARY ANGIOGRAPHY;  Surgeon: Orbie Pyo, MD;  Location: [facility];  Service: Cardiovascular;  Laterality: N/A;   ROTATOR CUFF REPAIR  04/25/1993   TEE WITHOUT CARDIOVERSION N/A 08/24/2021   Procedure: TRANSESOPHAGEAL ECHOCARDIOGRAM (TEE);  Surgeon: Jodelle Red, MD;  Location: [facility] ENDOSCOPY;  Service: Cardiovascular;  Laterality: N/A;    Allergies  Allergies  Allergen Reactions   Atorvastatin Other (See Comments)    Joint pain   Tomato     Mouth breaks out   Metoprolol Rash    Weakness / fatigue     History of Present Illness     Frank [name] is a 71 year old past medical history presents today for 44-month follow-up s/p mitral valve repair.  Frank [name] establish care with Dr. Lynnette Caffey in 07/2021 when he presented for follow-up for ongoing dyspnea.  During visit patient also endorsed increased wheezing when completing exertional activity.  He was started on Lasix 20 mg and cardiac CTA was ordered that revealed calcium score of 75 with normal coronaries.  TEE  echo was also obtained revealing prolapse of MV leaflet with moderate mitral regurgitation.  Following shared decision discussion patient was referred to [facility] for MV repair.  A Right/LHC was  completed 10/2021 that confirmed 3+ mitral regurgitation with mild pulmonary hypertension and mildly elevated LV diastolic pressure with no significant obstructive CAD. Frank [name] had his repair completed 12/2021 with no complications except for bout of SVT that resolved with beta-blocker therapy.  He was last seen by Dr. Lynnette Caffey and was doing well with no significant SOB and mild peripheral edema.  TTE was performed 02/2022 with EF of 50-55% and mild concentric LVH with mild MV regurg and functional mild stenosis.  Patient was seen 05/2022 for 107-month follow-up.  During his visit patient was euvolemic on exam and denied any chest pain but did note some swelling in his right lower extremity.  His Lasix was increased to 60 mg x 3 days and then 40 mg twice daily.  Frank [name] presents today for 74-month follow-up with his wife.  Since last being seen in the office patient reports that he has been doing well and reports resolution of right lower extremity swelling.  His blood pressure today was well-controlled at 120/64 and heart rate was 83 bpm.  He reports full compliance with his current medication regimen and denies any adverse reactions.  He is currently in the process of being evaluated for possible knee replacement but has not  yet selected a doctor or surgical date.  I advised him to follow-up with [place] when he does have more information and we will work on getting him surgically cleared for his procedure.  During our visit we discussed primary prevention and secondary prevention measures for cardiovascular disease.  He reports some shortness of breath that occurs with exertion and through further conversation have found that this is related to deconditioning.  Patient denies chest pain, palpitations, dyspnea, PND,  orthopnea, nausea, vomiting, dizziness, syncope, edema, weight gain, or early satiety.   Home Medications    Current Outpatient Medications  Medication Sig Dispense Refill   acetaminophen (TYLENOL) 500 MG tablet Take 1,000 mg by mouth daily.     albuterol (VENTOLIN HFA) 108 (90 Base) MCG/ACT inhaler INHALE 2 PUFFS BY MOUTH EVERY 6 HOURS AS NEEDED FOR WHEEZE OR SHORTNESS OF BREATH 8.5 each 1   ascorbic acid (VITAMIN C) 500 MG tablet Take 500 mg by mouth daily.     aspirin EC 81 MG tablet Take 1 tablet (81 mg total) by mouth daily. Swallow whole. 90 tablet 3   cholecalciferol (VITAMIN D) 25 MCG (1000 UNIT) tablet Take 1,000 Units by mouth daily.     cyclobenzaprine (FLEXERIL) 10 MG tablet Take 1 tablet (10 mg total) by mouth 3 (three) times daily as needed for muscle spasms. 30 tablet 0   fluticasone (FLONASE) 50 MCG/ACT nasal spray Place 1 spray into both nostrils daily as needed for allergies or rhinitis.     furosemide (LASIX) 20 MG tablet Take 1 tablet (20 mg total) by mouth 2 (two) times daily. (Patient taking differently: Take 40 mg by mouth 2 (two) times daily.) 180 tablet 3   lisinopril-hydrochlorothiazide (ZESTORETIC) 20-25 MG tablet TAKE 1 TABLET BY MOUTH EVERY DAY 90 tablet 1   meloxicam (MOBIC) 15 MG tablet TAKE 1 TABLET (15 MG TOTAL) BY MOUTH DAILY. 30 tablet 1   Potassium Chloride ER 20 MEQ TBCR Take 0.5 tablets by mouth daily. 45 tablet 3   rosuvastatin (CRESTOR) 5 MG tablet TAKE 1 TABLET (5 MG TOTAL) BY MOUTH DAILY. 90 tablet 1   tamsulosin (FLOMAX) 0.4 MG CAPS capsule TAKE 1 CAPSULE BY MOUTH EVERY DAY 90 capsule 1   triamcinolone (KENALOG) 0.1 % paste Apply to affected area 1-2 times daily (Patient taking differently: 1 application  daily as needed (mouth sores).) 5 g 12   vitamin B-12 (CYANOCOBALAMIN) 1000 MCG tablet Take 1,000 mcg by mouth daily.     Zinc 50 MG CAPS Take 50 mg by mouth daily.     No current facility-administered medications for this visit.     Review of  Systems  Please see the history of present illness.    (+) Chronic left knee pain (+) Shortness of breath with heavy exertion  All other systems reviewed and are otherwise negative except as noted above.  Physical Exam    Wt Readings from Last 3 Encounters:  09/19/22 176 lb (79.8 kg)  08/23/22 174 lb (78.9 kg)  07/18/22 173 lb 11.6 oz (78.8 kg)   QI:HKVQQ were no vitals filed for this visit.,There is no height or weight on file to calculate BMI.  Constitutional:      Appearance: Healthy appearance. Not in distress.  Neck:     Vascular: JVD normal.  Pulmonary:     Effort: Pulmonary effort is normal.     Breath sounds: No wheezing. No rales. Diminished in the bases Cardiovascular:     Normal rate. Regular rhythm. Normal  S1. Normal S2.      Murmurs: There is no murmur.  Edema:    Peripheral edema absent.  Abdominal:     Palpations: Abdomen is soft non tender. There is no hepatomegaly.  Skin:    General: Skin is warm and dry.  Neurological:     General: No focal deficit present.     Mental Status: Alert and oriented to person, place and time.     Cranial Nerves: Cranial nerves are intact.  EKG/LABS/Other Studies Reviewed    ECG personally reviewed by me today -none completed today   Lab Results  Component Value Date   WBC 8.0 10/31/2021   HGB 11.9 (L) 11/15/2021   HCT 35.0 (L) 11/15/2021   MCV 93 10/31/2021   PLT 251 10/31/2021   Lab Results  Component Value Date   CREATININE 1.09 06/11/2022   BUN 24 06/11/2022   NA 139 06/11/2022   K 4.1 06/11/2022   CL 101 06/11/2022   CO2 26 06/11/2022   Lab Results  Component Value Date   ALT 11 03/13/2022   AST 15 03/13/2022   ALKPHOS 58 03/13/2022   BILITOT 0.3 03/13/2022   Lab Results  Component Value Date   CHOL 193 03/13/2022   HDL 30 (L) 03/13/2022   LDLCALC 128 (H) 03/13/2022   LDLDIRECT 118.0 08/10/2015   TRIG 196 (H) 03/13/2022   CHOLHDL 6.4 (H) 03/13/2022    Lab Results  Component Value Date    HGBA1C 6.2 06/23/2021    Assessment & Plan    1.  History of severe mitral regurgitation: -s/p MV repair at [facility] 12/2021 with most recent echo and EF of 50-55% and mild concentric LVH with mild MV regurg and functional mild stenosis.  He denies any chest pain or dizziness. -He was encouraged to use his compression stockings and elevate extremities when dependent. -SBE prophylaxis discussed -We will change his Lasix to Monday, Wednesday, Friday with instruction to increase dose if weight increases or lower extremity edema present. -He will change potassium to 10 mEq on Monday, Wednesday, Friday only 2.  Essential hypertension: -BP today was 120/64 well-controlled -He is currently not on any antihypertensive medications   3.  GERD: -Followed by PCP continue current medication regimen   4.  Hyperlipidemia: -Last LDL was 128 still above goal of less than 70 -History of statin intolerance is currently followed by lipid clinic for possible PCSK9 inhibitor he is still deciding on whether to move forward at this time. -I encouraged him to abstain from high fat foods and minimize sweets  Disposition: Follow-up with Orbie Pyo, MD or APP in 12 months    Medication Adjustments/Labs and Tests Ordered: Current medicines are reviewed at length with the patient today.  Concerns regarding medicines are outlined above.   Signed, Napoleon Form, Leodis Rains, NP 10/04/2022, 7:16 PM [facility]  Note:  This document was prepared using Dragon voice recognition software and may include unintentional dictation errors.

## 2022-10-05 ENCOUNTER — Encounter: Payer: Self-pay | Admitting: Nurse Practitioner

## 2022-10-05 ENCOUNTER — Encounter: Payer: Medicare Other | Attending: Internal Medicine | Admitting: Nurse Practitioner

## 2022-10-05 VITALS — HR 83 | Ht 62.0 in | Wt 175.0 lb

## 2022-10-05 DIAGNOSIS — E785 Hyperlipidemia, unspecified: Secondary | ICD-10-CM | POA: Insufficient documentation

## 2022-10-05 DIAGNOSIS — I340 Nonrheumatic mitral (valve) insufficiency: Secondary | ICD-10-CM | POA: Diagnosis not present

## 2022-10-05 DIAGNOSIS — I10 Essential (primary) hypertension: Secondary | ICD-10-CM | POA: Insufficient documentation

## 2022-10-05 DIAGNOSIS — K219 Gastro-esophageal reflux disease without esophagitis: Secondary | ICD-10-CM | POA: Insufficient documentation

## 2022-10-05 MED ORDER — FUROSEMIDE 20 MG PO TABS: 20.0000 mg | ORAL_TABLET | ORAL | 3 refills | Status: DC

## 2022-10-05 MED ORDER — POTASSIUM CHLORIDE ER 20 MEQ PO TBCR: 0.5000 | EXTENDED_RELEASE_TABLET | ORAL | 3 refills | Status: DC

## 2022-10-05 NOTE — Patient Instructions (Signed)
Medication Instructions:  CHANGE Lasix to Monday, Wednesday and Friday 20mg  twice a day  TAKE potassium Monday, Wednesday and Friday with lasix  *If you need a refill on your cardiac medications before your next appointment, please call your pharmacy*   Lab Work: None ordered  Testing/Procedures: None ordered  Follow-Up: At [facility], you and your health needs are our priority.  As part of our continuing mission to provide you with exceptional heart care, we have created designated Provider Care Teams.  These Care Teams include your primary Cardiologist (physician) and Advanced Practice Providers (APPs -  Physician Assistants and Nurse Practitioners) who all work together to provide you with the care you need, when you need it.  We recommend signing up for the patient portal called "MyChart".  Sign up information is provided on this After Visit Summary.  MyChart is used to connect with patients for Virtual Visits (Telemedicine).  Patients are able to view lab/test results, encounter notes, upcoming appointments, etc.  Non-urgent messages can be sent to your provider as well.   To learn more about what you can do with MyChart, go to [facility].    Your next appointment:   12 month(s)  The format for your next appointment:   In Person  Provider:   [email or web address], MD     Other Instructions   Important Information About Sugar

## 2022-10-08 ENCOUNTER — Ambulatory Visit: Payer: Medicare Other | Admitting: Internal Medicine

## 2022-11-02 ENCOUNTER — Other Ambulatory Visit: Payer: Self-pay | Admitting: Family Medicine

## 2022-11-09 ENCOUNTER — Ambulatory Visit (INDEPENDENT_AMBULATORY_CARE_PROVIDER_SITE_OTHER): Payer: Medicare Other | Admitting: Orthopedic Surgery

## 2022-11-09 ENCOUNTER — Encounter: Payer: Self-pay | Admitting: Orthopedic Surgery

## 2022-11-09 ENCOUNTER — Telehealth: Payer: Self-pay | Admitting: Orthopedic Surgery

## 2022-11-09 DIAGNOSIS — M170 Bilateral primary osteoarthritis of knee: Secondary | ICD-10-CM

## 2022-11-09 MED ORDER — TRAMADOL HCL 50 MG PO TABS: 50.0000 mg | ORAL_TABLET | Freq: Four times a day (QID) | ORAL | 0 refills | Status: DC | PRN

## 2022-11-09 NOTE — Telephone Encounter (Signed)
Called and gave this information

## 2022-11-09 NOTE — Telephone Encounter (Signed)
Received call from [place] with [facility] needing DEA number and Rx verification. The number to contact [place] is [phone]

## 2022-11-09 NOTE — Progress Notes (Signed)
Office Visit Note   [name]           Date of Birth: 24-Nov-1950           MRN: [id number] Visit Date: 11/09/2022 Requested by: [name], MD [address] [place],  [place] [address] PCP: [name], MD  Subjective: Chief Complaint  Patient presents with   Right Knee - Pain   Left Knee - Pain    HPI: Frank [name] is a 72 y.o. male who presents to the office reporting  Bilateral knee pain right worse than left.  Patient ambulates with a cane.  Describes constant pain which does get better with rest.  The patient states the pain comes and goes in severity.  Does wake him from sleep at night.  He does report some weakness and giving way.  He has had an extensive course of exhausted nonoperative treatment including cortisone gel injection as well as Zilretta.  Cortisone injection helps but the gel injection does not help.  Last cortisone injection was in late November.  Mobic gave him hives on his hands.  He does not take blood thinners but does have a history of mitral valve repair in 323.  Takes Tylenol and Aleve at times with some relief.  He likes to walk and do antique furniture shows..                ROS: All systems reviewed are negative as they relate to the chief complaint within the history of present illness.  Patient denies fevers or chills.  Assessment & Plan: Visit Diagnoses:  1. Primary osteoarthritis of both knees     Plan: Impression is bilateral knee pain right worse than left with more varus malalignment on the right-hand side.  Has severe end-stage arthritis in that knee.  Affecting his quality of life and ADLs.  I think he is a reasonable candidate for total knee replacement.  The risk and benefits of that procedure are discussed with the patient.  He understands the grueling nature of the rehabilitative process.  He is going to consider his options and call if he wants to schedule.  I gave him our surgery scheduler's card.  Ultrasound of a  small Baker's cyst demonstrates a lot of debris in the cyst which does not look like it has a lot of potential for aspiration of a meaningful amount of fluid due to the debris.  Follow-Up Instructions: No follow-ups on file.   Orders:  No orders of the defined types were placed in this encounter.  Meds ordered this encounter  Medications   traMADol (ULTRAM) 50 MG tablet    Sig: Take 1 tablet (50 mg total) by mouth every 6 (six) hours as needed.    Dispense:  30 tablet    Refill:  0      Procedures: No procedures performed   Clinical Data: No additional findings.  Objective: Vital Signs: There were no vitals taken for this visit.  Physical Exam:  Constitutional: Patient appears well-developed HEENT:  Head: Normocephalic Eyes:EOM are normal Neck: Normal range of motion Cardiovascular: Normal rate Pulmonary/chest: Effort normal Neurologic: Patient is alert Skin: Skin is warm Psychiatric: Patient has normal mood and affect  Ortho Exam: Ortho exam demonstrates varus alignment on the right closer to normal alignment on the left.  Pedal pulses palpable.  Ankle dorsiflexion intact bilaterally.  No groin pain with internal or external rotation of either leg.  No masses lymphadenopathy or skin  changes noted in that knee region.  Extensor mechanism intact.  He has range of motion on the right of 0-1 10 on the left 5-1 10.  Specialty Comments:  No specialty comments available.  Imaging: No results found.   PMFS History: Patient Active Problem List   Diagnosis Date Noted   Nonrheumatic mitral valve regurgitation s/p repair 2023    Peroneal tendinitis of right lower extremity 04/25/2021   Benign localized prostatic hyperplasia with lower urinary tract symptoms (LUTS) 09/27/2020   Wheezing 09/14/2020   Chondrocalcinosis 03/14/2020   Dyslipidemia 09/23/2017   Bilateral primary osteoarthritis of knee 09/02/2017   Neck pain 02/15/2017   Primary osteoarthritis of right hip  02/15/2017   Tear of right acetabular labrum 02/15/2017   Right leg pain 01/04/2017   Obese 08/17/2015   Elevated blood sugar 08/17/2015   Shingles 09/10/2014   Dyspnea 96/01/5408   HERNIA, UMBILICAL 81/19/1478   Essential hypertension 01/30/2008   Allergic rhinitis 01/30/2008   GERD 01/30/2008   HEADACHE 01/30/2008   Past Medical History:  Diagnosis Date   Allergy    Asthma    Coronary artery disease    GERD (gastroesophageal reflux disease)    Headache(784.0)    Hypertension     Family History  Problem Relation Age of Onset   Kidney disease Mother    COPD Father    Pneumonia Father    Cardiomyopathy Brother    Hypertension Sister    Heart disease Brother    Hypertension Brother    Hypertension Brother    Hypertension Sister    Diabetes Sister    Osteoarthritis Sister     Past Surgical History:  Procedure Laterality Date   [place], 2009   kidney stones  05/20/1995   KNEE SURGERY  02/26/1989   NASAL SINUS SURGERY  10/24/1992   RIGHT/LEFT HEART CATH AND CORONARY ANGIOGRAPHY N/A 11/15/2021   Procedure: RIGHT/LEFT HEART CATH AND CORONARY ANGIOGRAPHY;  Surgeon: [name], MD;  Location: [facility] CV LAB;  Service: Cardiovascular;  Laterality: N/A;   ROTATOR CUFF REPAIR  04/25/1993   TEE WITHOUT CARDIOVERSION N/A 08/24/2021   Procedure: TRANSESOPHAGEAL ECHOCARDIOGRAM (TEE);  Surgeon: Buford Dresser, MD;  Location: [facility] ENDOSCOPY;  Service: Cardiovascular;  Laterality: N/A;   Social History   Occupational History   Not on file  Tobacco Use   Smoking status: Former   Smokeless tobacco: Never   Tobacco comments:    quit in 1972  Vaping Use   Vaping Use: Not on file  Substance and Sexual Activity   Alcohol use: No   Drug use: No   Sexual activity: Not on file

## 2022-11-15 ENCOUNTER — Ambulatory Visit (INDEPENDENT_AMBULATORY_CARE_PROVIDER_SITE_OTHER): Payer: Medicare Other

## 2022-11-15 VITALS — Wt 175.0 lb

## 2022-11-15 DIAGNOSIS — Z0000 Encounter for general adult medical examination without abnormal findings: Secondary | ICD-10-CM | POA: Diagnosis not present

## 2022-11-15 NOTE — Patient Instructions (Signed)
Frank [name] , Thank you for taking time to come for your Medicare Wellness Visit. I appreciate your ongoing commitment to your health goals. Please review the following plan we discussed and let me know if I can assist you in the future.   These are the goals we discussed:  Goals      Patient Stated     Walk better         This is a list of the screening recommended for you and due dates:  Health Maintenance  Topic Date Due   Zoster (Shingles) Vaccine (1 of 2) 11/23/2022*   Flu Shot  01/28/2023*   Colon Cancer Screening  02/17/2023*   Pneumonia Vaccine (3 - PPSV23 or PCV20) 08/24/2023*   Hepatitis C Screening: USPSTF Recommendation to screen - Ages 18-79 yo.  08/24/2023*   Medicare Annual Wellness Visit  11/16/2023   DTaP/Tdap/Td vaccine (3 - Td or Tdap) 08/16/2025   HPV Vaccine  Aged Out   COVID-19 Vaccine  Discontinued  *Topic was postponed. The date shown is not the original due date.    Advanced directives: Please bring a copy of your health care power of attorney and living will to the office at your convenience.  Conditions/risks identified: walk better   Next appointment: Follow up in one year for your annual wellness visit.   Preventive Care 72 Years and Older, Male  Preventive care refers to lifestyle choices and visits with your health care provider that can promote health and wellness. What does preventive care include? A yearly physical exam. This is also called an annual well check. Dental exams once or twice a year. Routine eye exams. Ask your health care provider how often you should have your eyes checked. Personal lifestyle choices, including: Daily care of your teeth and gums. Regular physical activity. Eating a healthy diet. Avoiding tobacco and drug use. Limiting alcohol use. Practicing safe sex. Taking low doses of aspirin every day. Taking vitamin and mineral supplements as recommended by your health care provider. What happens during an annual  well check? The services and screenings done by your health care provider during your annual well check will depend on your age, overall health, lifestyle risk factors, and family history of disease. Counseling  Your health care provider may ask you questions about your: Alcohol use. Tobacco use. Drug use. Emotional well-being. Home and relationship well-being. Sexual activity. Eating habits. History of falls. Memory and ability to understand (cognition). Work and work Statistician. Screening  You may have the following tests or measurements: Height, weight, and BMI. Blood pressure. Lipid and cholesterol levels. These may be checked every 5 years, or more frequently if you are over 26 years old. Skin check. Lung cancer screening. You may have this screening every year starting at age 72 if you have a 30-pack-year history of smoking and currently smoke or have quit within the past 15 years. Fecal occult blood test (FOBT) of the stool. You may have this test every year starting at age 72. Flexible sigmoidoscopy or colonoscopy. You may have a sigmoidoscopy every 5 years or a colonoscopy every 10 years starting at age 72. Prostate cancer screening. Recommendations will vary depending on your family history and other risks. Hepatitis C blood test. Hepatitis B blood test. Sexually transmitted disease (STD) testing. Diabetes screening. This is done by checking your blood sugar (glucose) after you have not eaten for a while (fasting). You may have this done every 1-3 years. Abdominal aortic aneurysm (AAA) screening. You may  need this if you are a current or former smoker. Osteoporosis. You may be screened starting at age 72 if you are at high risk. Talk with your health care provider about your test results, treatment options, and if necessary, the need for more tests. Vaccines  Your health care provider may recommend certain vaccines, such as: Influenza vaccine. This is recommended every  year. Tetanus, diphtheria, and acellular pertussis (Tdap, Td) vaccine. You may need a Td booster every 10 years. Zoster vaccine. You may need this after age 72. Pneumococcal 13-valent conjugate (PCV13) vaccine. One dose is recommended after age 72. Pneumococcal polysaccharide (PPSV23) vaccine. One dose is recommended after age 72. Talk to your health care provider about which screenings and vaccines you need and how often you need them. This information is not intended to replace advice given to you by your health care provider. Make sure you discuss any questions you have with your health care provider. Document Released: 11/11/2015 Document Revised: 07/04/2016 Document Reviewed: 08/16/2015 Elsevier Interactive Patient Education  2017 [place] Prevention in the Home Falls can cause injuries. They can happen to people of all ages. There are many things you can do to make your home safe and to help prevent falls. What can I do on the outside of my home? Regularly fix the edges of walkways and driveways and fix any cracks. Remove anything that might make you trip as you walk through a door, such as a raised step or threshold. Trim any bushes or trees on the path to your home. Use bright outdoor lighting. Clear any walking paths of anything that might make someone trip, such as rocks or tools. Regularly check to see if handrails are loose or broken. Make sure that both sides of any steps have handrails. Any raised decks and porches should have guardrails on the edges. Have any leaves, snow, or ice cleared regularly. Use sand or salt on walking paths during winter. Clean up any spills in your garage right away. This includes oil or grease spills. What can I do in the bathroom? Use night lights. Install grab bars by the toilet and in the tub and shower. Do not use towel bars as grab bars. Use non-skid mats or decals in the tub or shower. If you need to sit down in the shower, use a  plastic, non-slip stool. Keep the floor dry. Clean up any water that spills on the floor as soon as it happens. Remove soap buildup in the tub or shower regularly. Attach bath mats securely with double-sided non-slip rug tape. Do not have throw rugs and other things on the floor that can make you trip. What can I do in the bedroom? Use night lights. Make sure that you have a light by your bed that is easy to reach. Do not use any sheets or blankets that are too big for your bed. They should not hang down onto the floor. Have a firm chair that has side arms. You can use this for support while you get dressed. Do not have throw rugs and other things on the floor that can make you trip. What can I do in the kitchen? Clean up any spills right away. Avoid walking on wet floors. Keep items that you use a lot in easy-to-reach places. If you need to reach something above you, use a strong step stool that has a grab bar. Keep electrical cords out of the way. Do not use floor polish or wax that makes  floors slippery. If you must use wax, use non-skid floor wax. Do not have throw rugs and other things on the floor that can make you trip. What can I do with my stairs? Do not leave any items on the stairs. Make sure that there are handrails on both sides of the stairs and use them. Fix handrails that are broken or loose. Make sure that handrails are as long as the stairways. Check any carpeting to make sure that it is firmly attached to the stairs. Fix any carpet that is loose or worn. Avoid having throw rugs at the top or bottom of the stairs. If you do have throw rugs, attach them to the floor with carpet tape. Make sure that you have a light switch at the top of the stairs and the bottom of the stairs. If you do not have them, ask someone to add them for you. What else can I do to help prevent falls? Wear shoes that: Do not have high heels. Have rubber bottoms. Are comfortable and fit you  well. Are closed at the toe. Do not wear sandals. If you use a stepladder: Make sure that it is fully opened. Do not climb a closed stepladder. Make sure that both sides of the stepladder are locked into place. Ask someone to hold it for you, if possible. Clearly mark and make sure that you can see: Any grab bars or handrails. First and last steps. Where the edge of each step is. Use tools that help you move around (mobility aids) if they are needed. These include: Canes. Walkers. Scooters. Crutches. Turn on the lights when you go into a dark area. Replace any light bulbs as soon as they burn out. Set up your furniture so you have a clear path. Avoid moving your furniture around. If any of your floors are uneven, fix them. If there are any pets around you, be aware of where they are. Review your medicines with your doctor. Some medicines can make you feel dizzy. This can increase your chance of falling. Ask your doctor what other things that you can do to help prevent falls. This information is not intended to replace advice given to you by your health care provider. Make sure you discuss any questions you have with your health care provider. Document Released: 08/11/2009 Document Revised: 03/22/2016 Document Reviewed: 11/19/2014 Elsevier Interactive Patient Education  2017 [employer].

## 2022-11-15 NOTE — Progress Notes (Signed)
I connected with  Frank [name] on 11/15/22 by a audio enabled telemedicine application and verified that I am speaking with the correct person using two identifiers. Along with wife [name]   Patient Location: Home  Provider Location: Office/Clinic  I discussed the limitations of evaluation and management by telemedicine. The patient expressed understanding and agreed to proceed.  Subjective:   Frank [name] is a 72 y.o. male who presents for an Initial Medicare Annual Wellness Visit.  Review of Systems     Cardiac Risk Factors include: advanced age (>69men, >52 women);dyslipidemia;male gender;hypertension;obesity (BMI >30kg/m2)     Objective:    Today's Vitals   11/15/22 1127  Weight: 175 lb (79.4 kg)   Body mass index is 32.01 kg/m.     11/15/2022   11:35 AM 04/17/2022    2:05 PM 11/15/2021    8:23 AM 08/24/2021    7:28 AM 06/11/2021   11:00 AM 01/31/2021    9:37 AM  Advanced Directives  Does Patient Have a Medical Advance Directive? No No No No No No  Would patient like information on creating a medical advance directive? No - Patient declined No - Patient declined No - Patient declined No - Patient declined  Yes (MAU/Ambulatory/Procedural Areas - Information given)    Current Medications (verified) Outpatient Encounter Medications as of 11/15/2022  Medication Sig   acetaminophen (TYLENOL) 500 MG tablet Take 1,000 mg by mouth daily.   ascorbic acid (VITAMIN C) 500 MG tablet Take 500 mg by mouth daily.   aspirin EC 81 MG tablet Take 1 tablet (81 mg total) by mouth daily. Swallow whole.   cholecalciferol (VITAMIN D) 25 MCG (1000 UNIT) tablet Take 1,000 Units by mouth daily.   fluticasone (FLONASE) 50 MCG/ACT nasal spray Place 1 spray into both nostrils daily as needed for allergies or rhinitis.   furosemide (LASIX) 20 MG tablet Take 1 tablet (20 mg total) by mouth as directed. Take 1 tablet bid Mon/Wed/Fri ONLY   Potassium Chloride ER 20 MEQ TBCR Take 0.5 tablets by mouth  as directed. TAKE 1 TAB ON MON/WED/FRI ONLY   tamsulosin (FLOMAX) 0.4 MG CAPS capsule TAKE 1 CAPSULE BY MOUTH EVERY DAY   traMADol (ULTRAM) 50 MG tablet Take 1 tablet (50 mg total) by mouth every 6 (six) hours as needed.   triamcinolone (KENALOG) 0.1 % paste Apply to affected area 1-2 times daily (Patient taking differently: 1 application  daily as needed (mouth sores).)   vitamin B-12 (CYANOCOBALAMIN) 1000 MCG tablet Take 1,000 mcg by mouth daily.   Zinc 50 MG CAPS Take 50 mg by mouth daily.   [DISCONTINUED] lisinopril-hydrochlorothiazide (ZESTORETIC) 20-25 MG tablet TAKE 1 TABLET BY MOUTH EVERY DAY (Patient not taking: Reported on 10/05/2022)   No facility-administered encounter medications on file as of 11/15/2022.    Allergies (verified) Meloxicam, Atorvastatin, Metoprolol, and Tomato   History: Past Medical History:  Diagnosis Date   Allergy    Asthma    Coronary artery disease    GERD (gastroesophageal reflux disease)    Headache(784.0)    Hypertension    Past Surgical History:  Procedure Laterality Date   CARDIAC CATHETERIZATION     HERNIA REPAIR  1969, 2009   kidney stones  05/20/1995   KNEE SURGERY  02/26/1989   NASAL SINUS SURGERY  10/24/1992   RIGHT/LEFT HEART CATH AND CORONARY ANGIOGRAPHY N/A 11/15/2021   Procedure: RIGHT/LEFT HEART CATH AND CORONARY ANGIOGRAPHY;  Surgeon: Orbie Pyo, MD;  Location: [facility] INVASIVE CV [facility];  Service: Cardiovascular;  Laterality: N/A;   ROTATOR CUFF REPAIR  04/25/1993   TEE WITHOUT CARDIOVERSION N/A 08/24/2021   Procedure: TRANSESOPHAGEAL ECHOCARDIOGRAM (TEE);  Surgeon: Buford Dresser, MD;  Location: [facility] ENDOSCOPY;  Service: Cardiovascular;  Laterality: N/A;   Family History  Problem Relation Age of Onset   Kidney disease Mother    COPD Father    Pneumonia Father    Cardiomyopathy Brother    Hypertension Sister    Heart disease Brother    Hypertension Brother    Hypertension Brother    Hypertension Sister    Diabetes  Sister    Osteoarthritis Sister    Social History   Socioeconomic History   Marital status: Married    Spouse name: Not on file   Number of children: Not on file   Years of education: 11   Highest education level: 11th grade  Occupational History   Not on file  Tobacco Use   Smoking status: Former   Smokeless tobacco: Never   Tobacco comments:    quit in 1972  Vaping Use   Vaping Use: Not on file  Substance and Sexual Activity   Alcohol use: No   Drug use: No   Sexual activity: Not on file  Other Topics Concern   Not on file  Social History Narrative   Not on file   Social Determinants of Health   Financial Resource Strain: Low Risk  (11/15/2022)   Overall Financial Resource Strain (CARDIA)    Difficulty of Paying Living Expenses: Not hard at all  Food Insecurity: No Food Insecurity (11/15/2022)   Hunger Vital Sign    Worried About Running Out of Food in the Last Year: Never true    [place] in the Last Year: Never true  Transportation Needs: No Transportation Needs (11/15/2022)   PRAPARE - Hydrologist (Medical): No    Lack of Transportation (Non-Medical): No  Physical Activity: Insufficiently Active (11/15/2022)   Exercise Vital Sign    Days of Exercise per Week: 2 days    Minutes of Exercise per Session: 20 min  Stress: No Stress Concern Present (11/15/2022)   Glide    Feeling of Stress : Not at all  Social Connections: Moderately Isolated (11/15/2022)   Social Connection and Isolation Panel [NHANES]    Frequency of Communication with Friends and Family: More than three times a week    Frequency of Social Gatherings with Friends and Family: More than three times a week    Attends Religious Services: Never    Marine scientist or Organizations: No    Attends Music therapist: Never    Marital Status: Married    Tobacco  Counseling Counseling given: Not Answered Tobacco comments: quit in 1972   Clinical Intake:  Pre-visit preparation completed: Yes  Pain : No/denies pain     BMI - recorded: 32.01 Nutritional Status: BMI > 30  Obese Nutritional Risks: None Diabetes: No  How often do you need to have someone help you when you read instructions, pamphlets, or other written materials from your doctor or pharmacy?: 1 - Never  Diabetic?no  Interpreter Needed?: No  Information entered by :: Charlott Rakes, LPN   Activities of Daily Living    11/15/2022   11:38 AM  In your present state of health, do you have any difficulty performing the following activities:  Hearing? 1  Comment slight hearing loss  Vision? 0  Difficulty concentrating or making decisions? 0  Walking or climbing stairs? 1  Comment slowly  Dressing or bathing? 0  Doing errands, shopping? 0  Preparing Food and eating ? N  Using the Toilet? N  In the past six months, have you accidently leaked urine? N  Do you have problems with loss of bowel control? N  Managing your Medications? N  Managing your Finances? N  Housekeeping or managing your Housekeeping? N    Patient Care Team: Ardith Dark, MD as PCP - General (Family Medicine) Orbie Pyo, MD as PCP - Cardiology (Cardiology)  Indicate any recent Medical Services you may have received from other than [facility] providers in the past year (date may be approximate).     Assessment:   This is a routine wellness examination for [place].  Hearing/Vision screen Hearing Screening - Comments:: Pt stated slight hearing issues  Vision Screening - Comments:: Pt follows up with Dr Herbert Moors for annual eye exams   Dietary issues and exercise activities discussed: Current Exercise Habits: Home exercise routine, Type of exercise: walking, Time (Minutes): 20, Frequency (Times/Week): 2, Weekly Exercise (Minutes/Week): 40   Goals Addressed             This Visit's  Progress    Patient Stated       Walk better        Depression Screen    11/15/2022   11:32 AM 08/23/2022    7:35 AM 07/23/2022    9:50 AM 05/24/2022   10:04 AM 02/16/2022    1:04 PM 06/23/2021    1:09 PM 05/11/2021    8:59 AM  PHQ 2/9 Scores  PHQ - 2 Score 0 0 0 0 0 0 0    Fall Risk    11/15/2022   11:36 AM 08/23/2022    7:35 AM 05/24/2022   10:36 AM 02/16/2022    1:03 PM 09/14/2020    8:55 AM  Fall Risk   Falls in the past year? 1 1 0 0 0  Number falls in past yr: 1 1 0 0 0  Injury with Fall? 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 Yes  If so, are there any without handrails? No  Home free of loose throw rugs in walkways, pet beds, electrical cords, etc? Yes  Adequate lighting in your home to reduce risk of falls? Yes   ASSISTIVE DEVICES UTILIZED TO PREVENT FALLS:  Life alert? No  Use of a cane, walker or w/c? Yes  Grab bars in the bathroom? No  Shower chair or bench in shower? Yes  Elevated toilet seat or a handicapped toilet? No   TIMED UP AND GO:  Was the test performed? No .  Cognitive Function:        11/15/2022   11:40 AM  6CIT Screen  What Year? 0 points  What month? 0 points  What time? 0 points  Count back from 20 0 points  Months in reverse 4 points  Repeat phrase 2 points  Total Score 6 points    Immunizations Immunization History  Administered Date(s)  Administered   Influenza Split 08/21/2011   Influenza Whole 08/02/2008, 08/29/2010   Influenza, High Dose Seasonal PF 09/23/2017   Influenza,inj,Quad PF,6+ Mos 08/17/2015   Pneumococcal Conjugate-13 05/13/2017   Pneumococcal Polysaccharide-23  08/11/2001, 01/13/2007   Td 01/10/2006   Tdap 08/17/2015    TDAP status: Up to date  Flu Vaccine status: Due, Education has been provided regarding the importance of this vaccine. Advised may receive this vaccine at local pharmacy or Health Dept. Aware to provide a copy of the vaccination record if obtained from local pharmacy or Health Dept. Verbalized acceptance and understanding.  Pneumococcal vaccine status: Due, Education has been provided regarding the importance of this vaccine. Advised may receive this vaccine at local pharmacy or Health Dept. Aware to provide a copy of the vaccination record if obtained from local pharmacy or Health Dept. Verbalized acceptance and understanding.  Covid-19 vaccine status: Declined, Education has been provided regarding the importance of this vaccine but patient still declined. Advised may receive this vaccine at local pharmacy or Health Dept.or vaccine clinic. Aware to provide a copy of the vaccination record if obtained from local pharmacy or Health Dept. Verbalized acceptance and understanding.  Qualifies for Shingles Vaccine? Yes   Zostavax completed No   Shingrix Completed?: No.    Education has been provided regarding the importance of this vaccine. Patient has been advised to call insurance company to determine out of pocket expense if they have not yet received this vaccine. Advised may also receive vaccine at local pharmacy or Health Dept. Verbalized acceptance and understanding.  Screening Tests Health Maintenance  Topic Date Due   Zoster Vaccines- Shingrix (1 of 2) 11/23/2022 (Originally 08/31/1970)   INFLUENZA VACCINE  01/28/2023 (Originally 05/29/2022)   COLONOSCOPY (Pts 45-26yrs Insurance coverage will need to be confirmed)  02/17/2023 (Originally 08/31/1996)   Pneumonia Vaccine 12+ Years old (41 - PPSV23 or PCV20) 08/24/2023 (Originally 05/13/2018)   Hepatitis C Screening  08/24/2023 (Originally 08/31/1969)   Medicare Annual Wellness (AWV)   11/16/2023   DTaP/Tdap/Td (3 - Td or Tdap) 08/16/2025   HPV VACCINES  Aged Out   COVID-19 Vaccine  Discontinued    Health Maintenance  There are no preventive care reminders to display for this patient.   Pt stated completed cologuard    Additional Screening:  Hepatitis C Screening: does qualify  Vision Screening: Recommended annual ophthalmology exams for early detection of glaucoma and other disorders of the eye. Is the patient up to date with their annual eye exam?  Yes  Who is the provider or what is the name of the office in which the patient attends annual eye exams? Dr Jabier Mutton  If pt is not established with a provider, would they like to be referred to a provider to establish care? No .   Dental Screening: Recommended annual dental exams for proper oral hygiene  Community Resource Referral / Chronic Care Management: CRR required this visit?  No   CCM required this visit?  No      Plan:     I have personally reviewed and noted the following in the patient's chart:   Medical and social history Use of alcohol, tobacco or illicit drugs  Current medications and supplements including opioid prescriptions. Patient is currently taking opioid prescriptions. Information provided to patient regarding non-opioid alternatives. Patient advised to discuss non-opioid treatment plan with their provider. Functional ability and status Nutritional status Physical activity Advanced directives List of other physicians Hospitalizations, surgeries, and ER visits in previous 12 months  Vitals Screenings to include cognitive, depression, and falls Referrals and appointments  In addition, I have reviewed and discussed with patient certain preventive protocols, quality metrics, and best practice recommendations. A written personalized care plan for preventive services as well as general preventive health recommendations were provided to patient.     Marzella Schlein, LPN   [id number]    Nurse Notes: none

## 2022-11-19 ENCOUNTER — Ambulatory Visit: Payer: Self-pay

## 2022-11-19 ENCOUNTER — Telehealth: Payer: Self-pay | Admitting: Orthopedic Surgery

## 2022-11-19 ENCOUNTER — Encounter: Payer: Self-pay | Admitting: Surgical

## 2022-11-19 ENCOUNTER — Ambulatory Visit: Payer: Medicare Other | Admitting: Surgical

## 2022-11-19 DIAGNOSIS — M25562 Pain in left knee: Secondary | ICD-10-CM

## 2022-11-19 DIAGNOSIS — M25462 Effusion, left knee: Secondary | ICD-10-CM | POA: Diagnosis not present

## 2022-11-19 MED ORDER — ACETAMINOPHEN-CODEINE 300-30 MG PO TABS: 1.0000 | ORAL_TABLET | Freq: Two times a day (BID) | ORAL | 0 refills | Status: DC | PRN

## 2022-11-19 NOTE — Telephone Encounter (Signed)
Pt's wife Butch Penny called husband rolled his left knee. And need a soone appt. Please call Butch Penny at [phone].

## 2022-11-19 NOTE — Telephone Encounter (Signed)
scheduled

## 2022-11-21 ENCOUNTER — Ambulatory Visit: Payer: Medicare Other | Admitting: Orthopedic Surgery

## 2022-11-22 ENCOUNTER — Ambulatory Visit
Admission: RE | Admit: 2022-11-22 | Discharge: 2022-11-22 | Disposition: A | Discharge status: Home / Self Care | Payer: Medicare Other | Source: Ambulatory Visit | Attending: Surgical | Admitting: Surgical

## 2022-11-22 DIAGNOSIS — M25562 Pain in left knee: Secondary | ICD-10-CM

## 2022-11-23 ENCOUNTER — Ambulatory Visit: Payer: Medicare Other | Admitting: Surgical

## 2022-11-25 MED ORDER — LIDOCAINE HCL 1 % IJ SOLN
5.0000 mL | INTRAMUSCULAR | Status: AC | PRN
  Administered 2022-11-19: 5 mL

## 2022-11-25 MED ORDER — BUPIVACAINE HCL 0.25 % IJ SOLN
4.0000 mL | INTRAMUSCULAR | Status: AC | PRN
  Administered 2022-11-19: 4 mL via INTRA_ARTICULAR

## 2022-11-25 NOTE — Progress Notes (Signed)
Follow-up Office Visit Note   [name]           Date of Birth: [birth year]           MRN: [id number] Visit Date: 11/19/2022 Requested by: [name], [address] [place],   [id number] PCP: [name], MD  Subjective: Chief Complaint  Patient presents with   Left Knee - Pain    DOI 11/17/2022    HPI: Frank [name] is a 72 y.o. male who returns to the office for follow-up visit.    Plan at last visit was:  Impression is bilateral knee pain right worse than left with more varus malalignment on the right-hand side.  Has severe end-stage arthritis in that knee.  Affecting his quality of life and ADLs.  I think he is a reasonable candidate for total knee replacement.  The risk and benefits of that procedure are discussed with the patient.  He understands the grueling nature of the rehabilitative process.  He is going to consider his options and call if he wants to schedule.  I gave him our surgery scheduler's card.  Ultrasound of a small Baker's cyst demonstrates a lot of debris in the cyst which does not look like it has a lot of potential for aspiration of a meaningful amount of fluid due to the debris.   Since then, patient notes he sustained atypical injury to the left knee on 11/17/2022 when he was walking down to his mailbox to get his mail.  He turned around and was walking back up his driveway when he felt his knee pop and give out on him.  He was able to catch himself with the use of his cane before actually falling.  However, he had severe difficulty weightbearing since that event and had to have his wife help him back into the house.  He has increased pain in the left knee more so than the right knee since this event.  The right knee has historically bothered him with his end-stage knee arthritis in both knees.  No groin pain or radicular pain noted.  No numbness or tingling.  Using ice and Aleve and ambulating with a rollator.              ROS: All  systems reviewed are negative as they relate to the chief complaint within the history of present illness.  Patient denies fevers or chills.  Assessment & Plan: Visit Diagnoses:  1. Acute pain of left knee     Plan: Frank [name] is a 72 y.o. male who returns to the office for follow-up visit for left knee pain.  Plan from last visit was noted above in HPI.  They now return with increased left knee pain since injury on 11/17/2022.  He was last seen about a week ago and decided he would like to press forward with right knee replacement.  However, now his left knee is bothering him a lot more than his right knee since this recent event.  Unclear exactly what happened to cause this difficulty weightbearing.  He is marginally better today than he was at the time of injury 2 days ago.  He has radiographs taken today demonstrating no fracture identifiable.  After discussion of options, we plan to aspirate the left knee and administer Toradol injection.  Due to the presence of a fair amount of blood in the effusion that was aspirated, we will obtain left knee MRI scan for further  evaluation of occult fracture.  We will call after MRI to detail results and see how he is doing.  Could opt for left knee replacement before the right knee as he is still having a lot of increased left knee pain.  Follow-Up Instructions: No follow-ups on file.   Orders:  Orders Placed This Encounter  Procedures   XR KNEE 3 VIEW LEFT   MR Knee Left w/o contrast   Meds ordered this encounter  Medications   acetaminophen-codeine (TYLENOL #3) 300-30 MG tablet    Sig: Take 1 tablet by mouth every 12 (twelve) hours as needed for moderate pain.    Dispense:  20 tablet    Refill:  0      Procedures: Large Joint Inj: L knee on 11/19/2022 4:14 PM Indications: diagnostic evaluation, joint swelling and pain Details: 18 G 1.5 in needle, superolateral approach  Arthrogram: No  Medications: 5 mL lidocaine 1 %; 4 mL bupivacaine  0.25 % (4 cc of bupivacaine mixed with 1 cc Toradol) Aspirate: 30 mL Outcome: tolerated well, no immediate complications Procedure, treatment alternatives, risks and benefits explained, specific risks discussed. Consent was given by the patient. Immediately prior to procedure a time out was called to verify the correct patient, procedure, equipment, support staff and site/side marked as required. Patient was prepped and draped in the usual sterile fashion.       Clinical Data: No additional findings.  Objective: Vital Signs: There were no vitals taken for this visit.  Physical Exam:  Constitutional: Patient appears well-developed HEENT:  Head: Normocephalic Eyes:EOM are normal Neck: Normal range of motion Cardiovascular: Normal rate Pulmonary/chest: Effort normal Neurologic: Patient is alert Skin: Skin is warm Psychiatric: Patient has normal mood and affect  Ortho Exam: Ortho exam demonstrates left knee with large effusion.  He has tenderness over the medial or lateral joint lines.  No calf tenderness.  Negative Homans' sign.  No pain with hip range of motion.  He is able to perform straight leg raise.  Joint line tenderness is about moderate with increased tenderness over the medial joint line.  He is able to ambulate with moderate antalgia in the left leg.  Stable to varus and valgus stress at 0 and 30 degrees.  Stable to anterior posterior drawer.  Specialty Comments:  No specialty comments available.  Imaging: No results found.   PMFS History: Patient Active Problem List   Diagnosis Date Noted   Nonrheumatic mitral valve regurgitation s/p repair 2023    Peroneal tendinitis of right lower extremity 04/25/2021   Benign localized prostatic hyperplasia with lower urinary tract symptoms (LUTS) 09/27/2020   Wheezing 09/14/2020   Chondrocalcinosis 03/14/2020   Dyslipidemia 09/23/2017   Bilateral primary osteoarthritis of knee 09/02/2017   Neck pain 02/15/2017   Primary  osteoarthritis of right hip 02/15/2017   Tear of right acetabular labrum 02/15/2017   Right leg pain 01/04/2017   Obese 08/17/2015   Elevated blood sugar 08/17/2015   Shingles 09/10/2014   Dyspnea 11/27/2011   HERNIA, UMBILICAL 08/02/2008   Essential hypertension 01/30/2008   Allergic rhinitis 01/30/2008   GERD 01/30/2008   HEADACHE 01/30/2008   Past Medical History:  Diagnosis Date   Allergy    Asthma    Coronary artery disease    GERD (gastroesophageal reflux disease)    Headache(784.0)    Hypertension     Family History  Problem Relation Age of Onset   Kidney disease Mother    COPD Father    Pneumonia Father  Cardiomyopathy Brother    Hypertension Sister    Heart disease Brother    Hypertension Brother    Hypertension Brother    Hypertension Sister    Diabetes Sister    Osteoarthritis Sister     Past Surgical History:  Procedure Laterality Date   [place], 2009   kidney stones  05/20/1995   KNEE SURGERY  02/26/1989   NASAL SINUS SURGERY  10/24/1992   RIGHT/LEFT HEART CATH AND CORONARY ANGIOGRAPHY N/A 11/15/2021   Procedure: RIGHT/LEFT HEART CATH AND CORONARY ANGIOGRAPHY;  Surgeon: [name], MD;  Location: [facility];  Service: Cardiovascular;  Laterality: N/A;   ROTATOR CUFF REPAIR  04/25/1993   TEE WITHOUT CARDIOVERSION N/A 08/24/2021   Procedure: TRANSESOPHAGEAL ECHOCARDIOGRAM (TEE);  Surgeon: Buford Dresser, MD;  Location: [facility] ENDOSCOPY;  Service: Cardiovascular;  Laterality: N/A;   Social History   Occupational History   Not on file  Tobacco Use   Smoking status: Former   Smokeless tobacco: Never   Tobacco comments:    quit in 1972  Vaping Use   Vaping Use: Not on file  Substance and Sexual Activity   Alcohol use: No   Drug use: No   Sexual activity: Not on file

## 2022-11-26 ENCOUNTER — Other Ambulatory Visit: Payer: Self-pay | Admitting: Nurse Practitioner

## 2022-11-28 ENCOUNTER — Encounter: Payer: Self-pay | Admitting: Orthopedic Surgery

## 2022-11-28 ENCOUNTER — Ambulatory Visit: Payer: Medicare Other | Admitting: Orthopedic Surgery

## 2022-11-28 ENCOUNTER — Telehealth: Payer: Self-pay

## 2022-11-28 DIAGNOSIS — M1712 Unilateral primary osteoarthritis, left knee: Secondary | ICD-10-CM

## 2022-11-28 DIAGNOSIS — M1711 Unilateral primary osteoarthritis, right knee: Secondary | ICD-10-CM | POA: Diagnosis not present

## 2022-11-28 DIAGNOSIS — M170 Bilateral primary osteoarthritis of knee: Secondary | ICD-10-CM

## 2022-11-28 MED ORDER — LIDOCAINE HCL 1 % IJ SOLN
5.0000 mL | INTRAMUSCULAR | Status: AC | PRN
  Administered 2022-11-28: 5 mL

## 2022-11-28 NOTE — Telephone Encounter (Signed)
Received rxrf request for T#3 from patients pharmacy on behalf of patient. Pls advise.

## 2022-11-28 NOTE — Progress Notes (Signed)
Office Visit Note   [name]           Date of Birth: [birth year]           MRN: [id number] Visit Date: 11/28/2022 Requested by: [name], MD [address] [place],  [place] [address] PCP: [name], MD  Subjective: Chief Complaint  Patient presents with   Other     Scan review    HPI: Frank [name] is a 72 y.o. male who presents to the office reporting left knee pain.  He is here for MRI scan review.  Did have a bloody effusion in the left knee after stepping wrong on his knee.  He is likely having knee replacement on the 20th.  MRI scan is reviewed and does show severe arthritis with loose bodies but no acute fracture..                ROS: All systems reviewed are negative as they relate to the chief complaint within the history of present illness.  Patient denies fevers or chills.  Assessment & Plan: Visit Diagnoses:  1. Primary osteoarthritis of both knees     Plan: Impression is bilateral knee arthritis.  Had a long talk today with Frank [name] about knee replacement in which knee he should have replaced first.  There are advantages and disadvantages to replacing the left or right knee first.  He will decide which knee he wants to have replaced and then we will plan on doing that February 20.  Aspiration of both knees performed today with straw-colored fluid removed from each.  Follow-Up Instructions: No follow-ups on file.   Orders:  No orders of the defined types were placed in this encounter.  No orders of the defined types were placed in this encounter.     Procedures: Large Joint Inj: bilateral knee on 11/28/2022 11:22 PM Indications: diagnostic evaluation, joint swelling and pain Details: 18 G 1.5 in needle, superolateral approach  Arthrogram: No  Medications (Right): 5 mL lidocaine 1 % Medications (Left): 5 mL lidocaine 1 % Outcome: tolerated well, no immediate complications Procedure, treatment alternatives, risks and benefits  explained, specific risks discussed. Consent was given by the patient. Immediately prior to procedure a time out was called to verify the correct patient, procedure, equipment, support staff and site/side marked as required. Patient was prepped and draped in the usual sterile fashion.       Clinical Data: No additional findings.  Objective: Vital Signs: There were no vitals taken for this visit.  Physical Exam:  Constitutional: Patient appears well-developed HEENT:  Head: Normocephalic Eyes:EOM are normal Neck: Normal range of motion Cardiovascular: Normal rate Pulmonary/chest: Effort normal Neurologic: Patient is alert Skin: Skin is warm Psychiatric: Patient has normal mood and affect  Ortho Exam: Ortho exam demonstrates varus alignment.  Flexion contracture of at least 5 degrees present in both knees.  Patient is able to bend both knees to around 100-1 05.  Extensor mechanism intact.  No masses lymphadenopathy or skin changes noted in either the right or left leg region.  No nerve root tension signs.  Specialty Comments:  No specialty comments available.  Imaging: No results found.   PMFS History: Patient Active Problem List   Diagnosis Date Noted   Nonrheumatic mitral valve regurgitation s/p repair 2023    Peroneal tendinitis of right lower extremity 04/25/2021   Benign localized prostatic hyperplasia with lower urinary tract symptoms (LUTS) 09/27/2020   Wheezing 09/14/2020   Chondrocalcinosis  03/14/2020   Dyslipidemia 09/23/2017   Bilateral primary osteoarthritis of knee 09/02/2017   Neck pain 02/15/2017   Primary osteoarthritis of right hip 02/15/2017   Tear of right acetabular labrum 02/15/2017   Right leg pain 01/04/2017   Obese 08/17/2015   Elevated blood sugar 08/17/2015   Shingles 09/10/2014   Dyspnea 70/62/3762   HERNIA, UMBILICAL 83/15/1761   Essential hypertension 01/30/2008   Allergic rhinitis 01/30/2008   GERD 01/30/2008   HEADACHE 01/30/2008    Past Medical History:  Diagnosis Date   Allergy    Asthma    Coronary artery disease    GERD (gastroesophageal reflux disease)    Headache(784.0)    Hypertension     Family History  Problem Relation Age of Onset   Kidney disease Mother    COPD Father    Pneumonia Father    Cardiomyopathy Brother    Hypertension Sister    Heart disease Brother    Hypertension Brother    Hypertension Brother    Hypertension Sister    Diabetes Sister    Osteoarthritis Sister     Past Surgical History:  Procedure Laterality Date   [place], 2009   kidney stones  05/20/1995   KNEE SURGERY  02/26/1989   NASAL SINUS SURGERY  10/24/1992   RIGHT/LEFT HEART CATH AND CORONARY ANGIOGRAPHY N/A 11/15/2021   Procedure: RIGHT/LEFT HEART CATH AND CORONARY ANGIOGRAPHY;  Surgeon: [name], MD;  Location: [facility];  Service: Cardiovascular;  Laterality: N/A;   ROTATOR CUFF REPAIR  04/25/1993   TEE WITHOUT CARDIOVERSION N/A 08/24/2021   Procedure: TRANSESOPHAGEAL ECHOCARDIOGRAM (TEE);  Surgeon: Buford Dresser, MD;  Location: [facility] ENDOSCOPY;  Service: Cardiovascular;  Laterality: N/A;   Social History   Occupational History   Not on file  Tobacco Use   Smoking status: Former   Smokeless tobacco: Never   Tobacco comments:    quit in 1972  Vaping Use   Vaping Use: Not on file  Substance and Sexual Activity   Alcohol use: No   Drug use: No   Sexual activity: Not on file

## 2022-11-29 ENCOUNTER — Telehealth: Payer: Self-pay

## 2022-11-29 NOTE — Telephone Encounter (Signed)
Received fax from pharmacy-patient pharmacy is out of T#3 and suggested to send to send to [facility] on [address]

## 2022-11-30 ENCOUNTER — Other Ambulatory Visit: Payer: Self-pay | Admitting: Surgical

## 2022-11-30 MED ORDER — ACETAMINOPHEN-CODEINE 300-30 MG PO TABS: 1.0000 | ORAL_TABLET | Freq: Every day | ORAL | 0 refills | Status: DC | PRN

## 2022-11-30 NOTE — Telephone Encounter (Signed)
I sent in refill for Tylenol 3.  He needs to wean off this prior to surgery so I sent in it at a lower frequency.  Want to prevent his postop pain from being worse from taking narcotic pain medication prior to surgery.

## 2022-11-30 NOTE — Telephone Encounter (Signed)
I called and advised. 

## 2022-12-03 ENCOUNTER — Other Ambulatory Visit: Payer: Self-pay | Admitting: Surgical

## 2022-12-03 ENCOUNTER — Telehealth: Payer: Self-pay | Admitting: Surgical

## 2022-12-03 MED ORDER — ACETAMINOPHEN-CODEINE 300-30 MG PO TABS: 1.0000 | ORAL_TABLET | Freq: Every day | ORAL | 0 refills | Status: DC | PRN

## 2022-12-03 NOTE — Telephone Encounter (Signed)
Patient states the pharmacy his medication was called into doesn't have the medication. Please call [facility] on [address] has it please call new rx. And advise patient

## 2022-12-03 NOTE — Telephone Encounter (Signed)
Sent in

## 2022-12-04 NOTE — Telephone Encounter (Signed)
Called pt and advised rx sent to pharmacy [facility] on [address]

## 2022-12-05 ENCOUNTER — Other Ambulatory Visit: Payer: Self-pay | Admitting: Family Medicine

## 2022-12-06 ENCOUNTER — Telehealth: Payer: Self-pay

## 2022-12-06 NOTE — Telephone Encounter (Signed)
Received fax from [facility] notifying us that T#3 was on back order.

## 2022-12-07 NOTE — Progress Notes (Signed)
Surgical Instructions    Your procedure is scheduled on Tuesday, 12/18/22.  Report to [facility] Main Entrance "A" at 10:10 A.M., then check in with the Admitting office.  Call this number if you have problems the morning of surgery:  [phone]   If you have any questions prior to your surgery date call [phone]: Open Monday-Friday 8am-4pm If you experience any cold or flu symptoms such as cough, fever, chills, shortness of breath, etc. between now and your scheduled surgery, please notify us at the above number     Remember:  Do not eat after midnight the night before your surgery  You may drink clear liquids until 9:10am the morning of your surgery.   Clear liquids allowed are: Water, Non-Citrus Juices (without pulp), Carbonated Beverages, Clear Tea, Black Coffee ONLY (NO MILK, CREAM OR POWDERED CREAMER of any kind), and Gatorade  Patient Instructions  The night before surgery:  No food after midnight. ONLY clear liquids after midnight  The day of surgery (if you do NOT have diabetes):  Drink ONE (1) Pre-Surgery Clear Ensure by 9:10am the morning of surgery. Drink in one sitting. Do not sip.  This drink was given to you during your hospital  pre-op appointment visit. Nothing else to drink after completing the  Pre-Surgery Clear Ensure.           If you have questions, please contact your surgeon's office.     Take these medicines the morning of surgery with A SIP OF WATER:  acetaminophen (TYLENOL)  tamsulosin (FLOMAX)   IF NEEDED: acetaminophen-codeine (TYLENOL #3)  fluticasone (FLONASE)   As of today, STOP taking any Aspirin (unless otherwise instructed by your surgeon) Aleve, Naproxen, Ibuprofen, Motrin, Advil, Goody's, BC's, all herbal medications, fish oil, and all vitamins.           Do not wear jewelry or makeup. Do not wear lotions, powders, cologne or deodorant. Men may shave face and neck. Do not bring valuables to the hospital. Do not wear nail  polish, gel polish, artificial nails, or any other type of covering on natural nails (fingers and toes) If you have artificial nails or gel coating that need to be removed by a nail salon, please have this removed prior to surgery. Artificial nails or gel coating may interfere with anesthesia's ability to adequately monitor your vital signs.  [facility] is not responsible for any belongings or valuables.    Do NOT Smoke (Tobacco/Vaping)  24 hours prior to your procedure  If you use a CPAP at night, you may bring your mask for your overnight stay.   Contacts, glasses, hearing aids, dentures or partials may not be worn into surgery, please bring cases for these belongings   For patients admitted to the hospital, discharge time will be determined by your treatment team.   Patients discharged the day of surgery will not be allowed to drive home, and someone needs to stay with them for 24 hours.   SURGICAL WAITING ROOM VISITATION Patients having surgery or a procedure may have no more than 2 support people in the waiting area - these visitors may rotate.   Children under the age of 97 must have an adult with them who is not the patient. If the patient needs to stay at the hospital during part of their recovery, the visitor guidelines for inpatient rooms apply. Pre-op nurse will coordinate an appropriate time for 1 support person to accompany patient in pre-op.  This support person may not rotate.  Please refer to [email or web address][employer]hu for the visitor guidelines for Inpatients (after your surgery is over and you are in a regular room).    Special instructions:    Oral Hygiene is also important to reduce your risk of infection.  Remember - BRUSH YOUR TEETH THE MORNING OF SURGERY WITH YOUR REGULAR TOOTHPASTE   [facility]- Preparing For Surgery  Before surgery, you can play an important role. Because skin is not sterile, your skin needs to be  as free of germs as possible. You can reduce the number of germs on your skin by washing with CHG (chlorahexidine gluconate) Soap before surgery.  CHG is an antiseptic cleaner which kills germs and bonds with the skin to continue killing germs even after washing.     Please do not use if you have an allergy to CHG or antibacterial soaps. If your skin becomes reddened/irritated stop using the CHG.  Do not shave (including legs and underarms) for at least 48 hours prior to first CHG shower. It is OK to shave your face.  Please follow these instructions carefully.     Shower the NIGHT BEFORE SURGERY and the MORNING OF SURGERY with CHG Soap.   If you chose to wash your hair, wash your hair first as usual with your normal shampoo. After you shampoo, rinse your hair and body thoroughly to remove the shampoo.  Then [employer] and genitals (private parts) with your normal soap and rinse thoroughly to remove soap.  After that Use CHG Soap as you would any other liquid soap. You can apply CHG directly to the skin and wash gently with a scrungie or a clean washcloth.   Apply the CHG Soap to your body ONLY FROM THE NECK DOWN.  Do not use on open wounds or open sores. Avoid contact with your eyes, ears, mouth and genitals (private parts). Wash Face and genitals (private parts)  with your normal soap.   Wash thoroughly, paying special attention to the area where your surgery will be performed.  Thoroughly rinse your body with warm water from the neck down.  DO NOT shower/wash with your normal soap after using and rinsing off the CHG Soap.  Pat yourself dry with a CLEAN TOWEL.  Wear CLEAN PAJAMAS to bed the night before surgery  Place CLEAN SHEETS on your bed the night before your surgery  DO NOT SLEEP WITH PETS.   Day of Surgery: Take a shower with CHG soap. Wear Clean/Comfortable clothing the morning of surgery Do not apply any deodorants/lotions.   Remember to brush your teeth WITH YOUR REGULAR  TOOTHPASTE.    If you received a COVID test during your pre-op visit, it is requested that you wear a mask when out in public, stay away from anyone that may not be feeling well, and notify your surgeon if you develop symptoms. If you have been in contact with anyone that has tested positive in the last 10 days, please notify your surgeon.    Please read over the following fact sheets that you were given.

## 2022-12-10 ENCOUNTER — Encounter (HOSPITAL_COMMUNITY)
Admission: RE | Admit: 2022-12-10 | Discharge: 2022-12-10 | Disposition: A | Discharge status: Home / Self Care | Payer: Medicare Other | Source: Ambulatory Visit | Attending: Orthopedic Surgery | Admitting: Orthopedic Surgery

## 2022-12-10 ENCOUNTER — Telehealth: Payer: Self-pay

## 2022-12-10 ENCOUNTER — Encounter (HOSPITAL_COMMUNITY): Payer: Self-pay

## 2022-12-10 ENCOUNTER — Other Ambulatory Visit: Payer: Self-pay

## 2022-12-10 VITALS — BP 130/89 | HR 81 | Temp 97.7°F | Resp 18 | Ht 62.0 in | Wt 180.2 lb

## 2022-12-10 DIAGNOSIS — Z79899 Other long term (current) drug therapy: Secondary | ICD-10-CM | POA: Insufficient documentation

## 2022-12-10 DIAGNOSIS — Z01818 Encounter for other preprocedural examination: Secondary | ICD-10-CM

## 2022-12-10 DIAGNOSIS — Z01812 Encounter for preprocedural laboratory examination: Secondary | ICD-10-CM | POA: Insufficient documentation

## 2022-12-10 DIAGNOSIS — M1712 Unilateral primary osteoarthritis, left knee: Secondary | ICD-10-CM | POA: Insufficient documentation

## 2022-12-10 DIAGNOSIS — J45909 Unspecified asthma, uncomplicated: Secondary | ICD-10-CM | POA: Diagnosis not present

## 2022-12-10 DIAGNOSIS — I2510 Atherosclerotic heart disease of native coronary artery without angina pectoris: Secondary | ICD-10-CM | POA: Diagnosis not present

## 2022-12-10 DIAGNOSIS — I491 Atrial premature depolarization: Secondary | ICD-10-CM | POA: Diagnosis not present

## 2022-12-10 DIAGNOSIS — I119 Hypertensive heart disease without heart failure: Secondary | ICD-10-CM | POA: Diagnosis not present

## 2022-12-10 DIAGNOSIS — Z87891 Personal history of nicotine dependence: Secondary | ICD-10-CM | POA: Insufficient documentation

## 2022-12-10 DIAGNOSIS — I340 Nonrheumatic mitral (valve) insufficiency: Secondary | ICD-10-CM | POA: Insufficient documentation

## 2022-12-10 DIAGNOSIS — K219 Gastro-esophageal reflux disease without esophagitis: Secondary | ICD-10-CM | POA: Diagnosis not present

## 2022-12-10 LAB — CBC
HCT: 46.3 % (ref 39.0–52.0)
Hemoglobin: 15.1 g/dL (ref 13.0–17.0)
MCH: 30.9 pg (ref 26.0–34.0)
MCHC: 32.6 g/dL (ref 30.0–36.0)
MCV: 94.9 fL (ref 80.0–100.0)
Platelets: 256 10*3/uL (ref 150–400)
RBC: 4.88 MIL/uL (ref 4.22–5.81)
RDW: 12.1 % (ref 11.5–15.5)
WBC: 8 10*3/uL (ref 4.0–10.5)
nRBC: 0 % (ref 0.0–0.2)

## 2022-12-10 LAB — URINALYSIS, COMPLETE (UACMP) WITH MICROSCOPIC
Bilirubin Urine: NEGATIVE
Glucose, UA: NEGATIVE mg/dL
Hgb urine dipstick: NEGATIVE
Ketones, ur: NEGATIVE mg/dL
Leukocytes,Ua: NEGATIVE
Nitrite: NEGATIVE
Protein, ur: NEGATIVE mg/dL
Specific Gravity, Urine: 1.013 (ref 1.005–1.030)
pH: 6 (ref 5.0–8.0)

## 2022-12-10 LAB — BASIC METABOLIC PANEL
Anion gap: 8 (ref 5–15)
BUN: 16 mg/dL (ref 8–23)
CO2: 27 mmol/L (ref 22–32)
Calcium: 9.3 mg/dL (ref 8.9–10.3)
Chloride: 103 mmol/L (ref 98–111)
Creatinine, Ser: 0.95 mg/dL (ref 0.61–1.24)
GFR, Estimated: >60 mL/min (ref >60)
Glucose, Bld: 120 mg/dL — ABNORMAL HIGH (ref 70–99)
Potassium: 3.8 mmol/L (ref 3.5–5.1)
Sodium: 138 mmol/L (ref 135–145)

## 2022-12-10 LAB — SURGICAL PCR SCREEN
MRSA, PCR: NEGATIVE
Staphylococcus aureus: POSITIVE — AB

## 2022-12-10 NOTE — Telephone Encounter (Signed)
Patient called triage. He is scheduled for a left total knee arthroplasty with Dr.Dean on 12/18/2022. He said that his right knee is really swollen. He states that Dr.Dean drained this knee a couple weeks ago. He is wondering if he should have this knee drained again before surgery because he is going to be more dependent on that knee following surgery. Please advise. Call back [phone] Thanks!

## 2022-12-10 NOTE — Progress Notes (Signed)
Notified Dr. Marlou Sa and Gloriann Loan, PA-C of abnormal urine results at pre op appointment.

## 2022-12-10 NOTE — Telephone Encounter (Signed)
He can come in sometime this week and have it done or we can just do it at the time of surgery what ever he chooses.  If he wants to come in Friday afternoon he can do that too.

## 2022-12-10 NOTE — Progress Notes (Signed)
PCP - caleb parker Cardiologist - Thukkani  PPM/ICD - denies  Chest x-ray - n/a EKG - 05/24/22 Stress Test - 12/05/11 ECHO - 03/07/22 Cardiac Cath - 11/15/21  Sleep Study - denies  Follow your surgeon's instructions on when to stop Aspirin.  If no instructions were given by your surgeon then you will need to call the office to get those instructions.     ERAS Protcol -yes PRE-SURGERY Ensure or G2- ensure given  COVID TEST- not needed   Anesthesia review: yes, cardiac history  Patient denies shortness of breath, fever, cough and chest pain at PAT appointment   All instructions explained to the patient, with a verbal understanding of the material. Patient agrees to go over the instructions while at home for a better understanding. Patient also instructed to self quarantine after being tested for COVID-19. The opportunity to ask questions was provided.

## 2022-12-10 NOTE — Telephone Encounter (Signed)
scheduled

## 2022-12-11 ENCOUNTER — Ambulatory Visit: Payer: Medicare Other | Admitting: Orthopedic Surgery

## 2022-12-11 DIAGNOSIS — M170 Bilateral primary osteoarthritis of knee: Secondary | ICD-10-CM | POA: Diagnosis not present

## 2022-12-11 DIAGNOSIS — M1712 Unilateral primary osteoarthritis, left knee: Secondary | ICD-10-CM

## 2022-12-11 LAB — URINE CULTURE: Culture: <10000 — AB

## 2022-12-11 NOTE — Progress Notes (Signed)
Anesthesia Chart Review:  Case: [id number] Date/Time: 12/18/22 1155   Procedure: LEFT TOTAL KNEE ARTHROPLASTY (Left: Knee)   Anesthesia type: Spinal   Pre-op diagnosis: left knee osteoarthritis   Location: [facility] OR ROOM 06 / [place] OR   Surgeons: Meredith Pel, MD       DISCUSSION: Patient is a 72 year old male scheduled for the above procedure.  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  Lasts visit with cardiology was on 10/05/22 with DAmbrose Pancoast NP.  Most recent echo and EF of 50-55% and mild concentric LVH with mild MV regurg and functional mild stenosis.  He denied any chest pain or dizziness. RLE swelling improved, so Lasix and KCL dosing adjusted. He encouraged continued use compression stockings. He was aware that patient was being evaluated for TKA, but encouraged him to let them know once scheduled.   Notes suggest that he may have a right knee effusion aspiration prior to or before the day of surgery.    VS: BP 130/89   Pulse 81   Temp 36.5 C (Oral)   Resp 18   Ht 5' 2"$  (1.575 m)   Wt 81.7 kg   SpO2 98%   BMI 32.96 kg/m    PROVIDERS: PVivi Barrack MD is PCP  TGuerry Minors MD is cardiologist GDurward Mallard MD is CT surgeon ([facility])   LABS: Labs reviewed: Acceptable for surgery. PAT RN sent communication to surgeon regarding urine results.  (all labs ordered are listed, but only abnormal results are displayed)  Labs Reviewed  URINE CULTURE - Abnormal; Notable for the following components:      Result Value   Culture   (*)    Value: <10,000 COLONIES/mL INSIGNIFICANT GROWTH [facility]formed at [facility] [address] [place] [address]   All other components within normal limits  SURGICAL PCR SCREEN - Abnormal; Notable for the following components:    Staphylococcus aureus POSITIVE (*)    All other components within normal limits  BASIC METABOLIC PANEL - Abnormal; Notable for the following components:   Glucose, Bld 120 (*)    All other components within normal limits  URINALYSIS, COMPLETE (UACMP) WITH MICROSCOPIC - Abnormal; Notable for the following components:   APPearance HAZY (*)    Bacteria, UA RARE (*)    All other components within normal limits  CBC     IMAGES: MRI Left 11/22/22: IMPRESSION: 1. Tricompartmental osteoarthritis of the left knee, severe within the medial compartment. 2. Complex tearing/maceration of the medial meniscus. 3. Intrasubstance degeneration of the lateral meniscal posterior horn with presumed degenerative tearing. 4. Severe mucoid degeneration of the posterior cruciate ligament. ACL is not well seen and may be chronically torn or severely degenerated. 5. Large joint effusion containing numerous intra-articular loose bodies. 6. Small Baker's cyst.  3V CXR/right ribs 08/23/22: IMPRESSION: 1. No active cardiopulmonary disease. 2. Old healed rib fractures anteriorly affecting the lower right ribs. No acute rib fracture seen. One of the oblique views suggests that there may be a small amount of pleural herniation between 2 of the ribs, but this is not appear to relate to an acute injury and should not be of clinical significance.     EKG: 05/24/22: Sinus rhythm with Premature atrial complexes Otherwise normal ECG No previous ECGs available Confirmed by MAngelena Form  Harrell Gave [phone]) on 05/24/2022 12:13:31 PM   CV: Echo 03/07/22: IMPRESSIONS   1. Left ventricular ejection fraction, by estimation, is 50 to 55%. The  left ventricle has low normal function. The left ventricle has no regional  wall motion abnormalities. There is mild concentric left ventricular  hypertrophy. Left ventricular  diastolic function could not be evaluated.   2. Right ventricular systolic function is normal. The  right ventricular  size is normal. There is normal pulmonary artery systolic pressure.   3. The mitral valve 26 Simulus ring is well seated. The mitral valve has  been repaired. Mild mitral valve regurgitation. Functional mild mitral  stenosis. The mean mitral valve gradient is 6.0 mmHg. There is a present  in the mitral position. Procedure  Date: 01/22/2022.   4. The aortic valve is tricuspid. Aortic valve regurgitation is not  visualized. No aortic stenosis is present.   5. The inferior vena cava is normal in size with greater than 50%  respiratory variability, suggesting right atrial pressure of 3 mmHg.   6. There is borderline dilatation of the aortic root, measuring 36 mm.    Cardiac cath 11/15/21 (pre-MV Repair):    LV end diastolic pressure is mildly elevated.   Hemodynamic findings consistent with mild pulmonary hypertension.   There is moderate (3+) mitral regurgitation.   1.  Left dominant circulation with no significant obstructive coronary artery disease. 2.  Preserved cardiac output of 5.8 L/min and cardiac index of 3.15 L/min/m with mean RA pressure of 9 mmHg and mean wedge pressure of 18 mmHg; V waves were only 21 mmHg despite multiple wedge pressure assessments. 3.  At least moderate mitral regurgitation on ventriculography with preserved ejection fraction. - S/p MV repair using 14m Simulus ring, Goretex artificial chord to P2, and Alfieri stitch via HP on 01/02/22   Past Medical History:  Diagnosis Date   Allergy    Asthma    Coronary artery disease    GERD (gastroesophageal reflux disease)    Headache(784.0)    Hypertension     Past Surgical History:  Procedure Laterality Date   [place]   kidney stones  05/20/1995   KNEE SURGERY  02/26/1989   MITRAL VALVE REPAIR  01/02/2022   NASAL SINUS SURGERY  10/24/1992   RIGHT/LEFT HEART CATH AND CORONARY ANGIOGRAPHY N/A 11/15/2021   Procedure: RIGHT/LEFT HEART CATH AND  CORONARY ANGIOGRAPHY;  Surgeon: TEarly Osmond MD;  [facility];  Service: Cardiovascular;  Laterality: N/A;   ROTATOR CUFF REPAIR Left 04/25/1993   TEE WITHOUT CARDIOVERSION N/A 08/24/2021   Procedure: TRANSESOPHAGEAL ECHOCARDIOGRAM (TEE);  Surgeon: CBuford Dresser MD;  [facility];  Service: Cardiovascular;  Laterality: N/A;    MEDICATIONS:  acetaminophen (TYLENOL) 500 MG tablet   acetaminophen-codeine (TYLENOL #3) 300-30 MG tablet   ascorbic acid (VITAMIN C) 500 MG tablet   aspirin EC 81 MG tablet   cholecalciferol (VITAMIN D) 25 MCG (1000 UNIT) tablet   fluticasone (FLONASE) 50 MCG/ACT nasal spray   furosemide (LASIX) 20 MG tablet   meloxicam (MOBIC) 15 MG tablet   Potassium Chloride ER 20 MEQ TBCR   tamsulosin (FLOMAX) 0.4 MG CAPS capsule   triamcinolone (KENALOG) 0.1 % paste   vitamin B-12 (CYANOCOBALAMIN) 1000 MCG tablet   Zinc 50 MG CAPS   No current facility-administered medications for this encounter.    AMyra Gianotti PA-C Surgical Short Stay/Anesthesiology [facility] ([phone]Phone ([phone]  [phone] 12/11/2022 7:27 PM

## 2022-12-12 ENCOUNTER — Telehealth: Payer: Self-pay | Admitting: Nurse Practitioner

## 2022-12-12 ENCOUNTER — Telehealth: Payer: Self-pay

## 2022-12-12 ENCOUNTER — Encounter: Payer: Self-pay | Admitting: Orthopedic Surgery

## 2022-12-12 MED ORDER — BUPIVACAINE HCL 0.25 % IJ SOLN
4.0000 mL | INTRAMUSCULAR | Status: AC | PRN
  Administered 2022-12-11: 4 mL via INTRA_ARTICULAR

## 2022-12-12 MED ORDER — METHYLPREDNISOLONE ACETATE 40 MG/ML IJ SUSP
40.0000 mg | INTRAMUSCULAR | Status: AC | PRN
  Administered 2022-12-11: 40 mg via INTRA_ARTICULAR

## 2022-12-12 MED ORDER — LIDOCAINE HCL 1 % IJ SOLN
5.0000 mL | INTRAMUSCULAR | Status: AC | PRN
  Administered 2022-12-11: 5 mL

## 2022-12-12 NOTE — Telephone Encounter (Deleted)
   Pre-operative Risk Assessment    Patient Name: Frank [name]  DOB: February 08, 1951 MRN: [id number]      Request for Surgical Clearance    Procedure:   Left Total Knee  Date of Surgery:  Clearance 12/18/22                                 Surgeon:  Anderson Malta, MD Surgeon's Group or Practice Name:  [facility] number:  [phone] Fax number:  [phone]   Type of Clearance Requested:   - Medical  - Pharmacy:  Hold Aspirin     Type of Anesthesia:  Spinal   Additional requests/questions:   N/A  SignedJamelle Haring   12/12/2022, 2:59 PM

## 2022-12-12 NOTE — Telephone Encounter (Signed)
Spoke with the patient who is agreeable with telehealth appointment.   Med list reviewed and consent given. Patient voiced understanding of process and what to expect for telehealth appt.

## 2022-12-12 NOTE — Telephone Encounter (Signed)
Spoke with the patient's wife and she states the physicians office who is doing the procedure states they faxed it over this morning. I informed her that I have multiple faxes and we have not received it yet. I requested she have them refax it.   Fax number given to pre op fax.   Wife voiced understanding.

## 2022-12-12 NOTE — Telephone Encounter (Signed)
   Name: Frank [name]  DOB: [birth year]  MRN: [id number]  Primary Cardiologist: [name], MD   Preoperative team, please contact this patient and set up a phone call appointment for further preoperative risk assessment. Please obtain consent and complete medication review. Thank you for your help.  I confirm that guidance regarding antiplatelet and oral anticoagulation therapy has been completed and, if necessary, noted below.  Regarding ASA therapy, it may be stopped 5-7 days prior to surgery with a plan to resume it as soon as felt to be feasible from a surgical standpoint in the post-operative period.    Mable Fill, Marissa Nestle, NP 12/12/2022, 3:09 PM [place]

## 2022-12-12 NOTE — Progress Notes (Signed)
Office Visit Note   [name]           Date of Birth: 05-21-1951           MRN: [id number] Visit Date: 12/11/2022 Requested by: Frank Barrack, MD [address] [place],  [place] [address] PCP: Frank Barrack, MD  Subjective: Chief Complaint  Patient presents with   Left Knee - Pain    HPI: Frank [name] is a 72 y.o. male who presents to the office reporting right knee pain.  He is scheduled for left total knee replacement 12/18/2022.  His right knee will have to be very functional in order for him to get through the surgery.  He has some recurrent swelling in the right knee.  We have looked at the Baker's cyst in the right knee before with ultrasound and it appears to be a complex cyst with some fluid but a lot of debris in the cystic structure which is not particularly amenable to aspiration..                ROS: All systems reviewed are negative as they relate to the chief complaint within the history of present illness.  Patient denies fevers or chills.  Assessment & Plan: Visit Diagnoses:  1. Primary osteoarthritis of both knees     Plan: Impression is right knee arthritis and effusion.  Right knee is aspirated and injected today.  He typically gets several months of relief from an injection.  Plan to see him for surgery next week.  Follow-Up Instructions: No follow-ups on file.   Orders:  No orders of the defined types were placed in this encounter.  No orders of the defined types were placed in this encounter.     Procedures: Large Joint Inj: R knee on 12/11/2022 6:31 AM Indications: diagnostic evaluation, joint swelling and pain Details: 18 G 1.5 in needle, superolateral approach  Arthrogram: No  Medications: 5 mL lidocaine 1 %; 40 mg methylPREDNISolone acetate 40 MG/ML; 4 mL bupivacaine 0.25 % Outcome: tolerated well, no immediate complications Procedure, treatment alternatives, risks and benefits explained, specific risks discussed. Consent was  given by the patient. Immediately prior to procedure a time out was called to verify the correct patient, procedure, equipment, support staff and site/side marked as required. Patient was prepped and draped in the usual sterile fashion.       Clinical Data: No additional findings.  Objective: Vital Signs: There were no vitals taken for this visit.  Physical Exam:  Constitutional: Patient appears well-developed HEENT:  Head: Normocephalic Eyes:EOM are normal Neck: Normal range of motion Cardiovascular: Normal rate Pulmonary/chest: Effort normal Neurologic: Patient is alert Skin: Skin is warm Psychiatric: Patient has normal mood and affect  Ortho Exam: Ortho exam demonstrates varus alignment right knee with moderate effusion.  Extensor mechanism intact.  Pedal pulses palpable.  Collaterals are stable.  Specialty Comments:  No specialty comments available.  Imaging: No results found.   PMFS History: Patient Active Problem List   Diagnosis Date Noted   Nonrheumatic mitral valve regurgitation s/p repair 2023    Peroneal tendinitis of right lower extremity 04/25/2021   Benign localized prostatic hyperplasia with lower urinary tract symptoms (LUTS) 09/27/2020   Wheezing 09/14/2020   Chondrocalcinosis 03/14/2020   Dyslipidemia 09/23/2017   Bilateral primary osteoarthritis of knee 09/02/2017   Neck pain 02/15/2017   Primary osteoarthritis of right hip 02/15/2017   Tear of right acetabular labrum 02/15/2017   Right leg pain 01/04/2017  Obese 08/17/2015   Elevated blood sugar 08/17/2015   Shingles 09/10/2014   Dyspnea 123XX123   HERNIA, UMBILICAL 123XX123   Essential hypertension 01/30/2008   Allergic rhinitis 01/30/2008   GERD 01/30/2008   HEADACHE 01/30/2008   Past Medical History:  Diagnosis Date   Allergy    Asthma    Coronary artery disease    GERD (gastroesophageal reflux disease)    Headache(784.0)    Hypertension     Family History  Problem  Relation Age of Onset   Kidney disease Mother    COPD Father    Pneumonia Father    Cardiomyopathy Brother    Hypertension Sister    Heart disease Brother    Hypertension Brother    Hypertension Brother    Hypertension Sister    Diabetes Sister    Osteoarthritis Sister     Past Surgical History:  Procedure Laterality Date   [place], 2009   kidney stones  05/20/1995   KNEE SURGERY  02/26/1989   MITRAL VALVE REPAIR  01/02/2022   NASAL SINUS SURGERY  10/24/1992   RIGHT/LEFT HEART CATH AND CORONARY ANGIOGRAPHY N/A 11/15/2021   Procedure: RIGHT/LEFT HEART CATH AND CORONARY ANGIOGRAPHY;  Surgeon: [name], MD;  Location: [place] [facility];  Service: Cardiovascular;  Laterality: N/A;   ROTATOR CUFF REPAIR Left 04/25/1993   TEE WITHOUT CARDIOVERSION N/A 08/24/2021   Procedure: TRANSESOPHAGEAL ECHOCARDIOGRAM (TEE);  Surgeon: Buford Dresser, MD;  Location: [facility] END[facility]COPY;  Service: Cardiovascular;  Laterality: N/A;   Social History   Occupational History   Not on file  Tobacco Use   Smoking status: Former   Smokeless tobacco: Never   Tobacco comments:    quit in 1972  Vaping Use   Vaping Use: Never used  Substance and Sexual Activity   Alcohol use: No   Drug use: No   Sexual activity: Not on file

## 2022-12-12 NOTE — Telephone Encounter (Signed)
Clearance received and sent to pre-op provider.

## 2022-12-12 NOTE — Telephone Encounter (Signed)
  Patient Consent for Virtual Visit         Frank [name] has provided verbal consent on 12/12/2022 for a virtual visit (video or telephone).   CONSENT FOR VIRTUAL VISIT FOR:  Frank [name]  By participating in this virtual visit I agree to the following:  I hereby voluntarily request, consent and authorize [facility] and its employed or contracted physicians, physician assistants, nurse practitioners or other licensed health care professionals (the Practitioner), to provide me with telemedicine health care services (the "Services") as deemed necessary by the treating Practitioner. I acknowledge and consent to receive the Services by the Practitioner via telemedicine. I understand that the telemedicine visit will involve communicating with the Practitioner through live audiovisual communication technology and the disclosure of certain medical information by electronic transmission. I acknowledge that I have been given the opportunity to request an in-person assessment or other available alternative prior to the telemedicine visit and am voluntarily participating in the telemedicine visit.  I understand that I have the right to withhold or withdraw my consent to the use of telemedicine in the course of my care at any time, without affecting my right to future care or treatment, and that the Practitioner or I may terminate the telemedicine visit at any time. I understand that I have the right to inspect all information obtained and/or recorded in the course of the telemedicine visit and may receive copies of available information for a reasonable fee.  I understand that some of the potential risks of receiving the Services via telemedicine include:  Delay or interruption in medical evaluation due to technological equipment failure or disruption; Information transmitted may not be sufficient (e.g. poor resolution of images) to allow for appropriate medical decision making by the  Practitioner; and/or  In rare instances, security protocols could fail, causing a breach of personal health information.  Furthermore, I acknowledge that it is my responsibility to provide information about my medical history, conditions and care that is complete and accurate to the best of my ability. I acknowledge that Practitioner's advice, recommendations, and/or decision may be based on factors not within their control, such as incomplete or inaccurate data provided by me or distortions of diagnostic images or specimens that may result from electronic transmissions. I understand that the practice of medicine is not an exact science and that Practitioner makes no warranties or guarantees regarding treatment outcomes. I acknowledge that a copy of this consent can be made available to me via my patient portal ([facility]), or I can request a printed copy by calling the office of [facility].    I understand that my insurance will be billed for this visit.   I have read or had this consent read to me. I understand the contents of this consent, which adequately explains the benefits and risks of the Services being provided via telemedicine.  I have been provided ample opportunity to ask questions regarding this consent and the Services and have had my questions answered to my satisfaction. I give my informed consent for the services to be provided through the use of telemedicine in my medical care

## 2022-12-12 NOTE — Telephone Encounter (Signed)
..     Pre-operative Risk Assessment    Patient Name: Frank [name]  DOB: [birth year] MRN: [id number]      Request for Surgical Clearance    Procedure:   left 6otal knee replacement  Date of Surgery:  Clearance 12/18/22                                 Surgeon:  dr Nicki Reaper dean Surgeon's Group or Practice Name:  [facility] Phone number:  [phone] Fax number:  [phone]   Type of Clearance Requested:   - Medical  - Pharmacy:  Hold Aspirin     Type of Anesthesia:  Spinal   Additional requests/questions:    SignedMerry Lofty   12/12/2022, 1:35 PM

## 2022-12-12 NOTE — Telephone Encounter (Signed)
patient's spouse is calling wanting to know if pre-op received the medical clearance via fax

## 2022-12-13 ENCOUNTER — Ambulatory Visit: Payer: Medicare Other | Attending: Cardiology | Admitting: Physician Assistant

## 2022-12-13 DIAGNOSIS — Z01810 Encounter for preprocedural cardiovascular examination: Secondary | ICD-10-CM

## 2022-12-13 NOTE — Progress Notes (Signed)
Virtual Visit via Telephone Note   Because of Frank [name]'s co-morbid illnesses, he is at least at moderate risk for complications without adequate follow up.  This format is felt to be most appropriate for this patient at this time.  The patient did not have access to video technology/had technical difficulties with video requiring transitioning to audio format only (telephone).  All issues noted in this document were discussed and addressed.  No physical exam could be performed with this format.  Please refer to the patient's chart for his consent to telehealth for [facility].  Evaluation Performed:  Preoperative cardiovascular risk assessment _____________   Date:  12/13/2022   Patient ID:  Frank [name], DOB [birth year], MRN [id number] Patient Location:  Home Provider location:   Office  Primary Care Provider:  [name], MD Primary Cardiologist:  [name], MD  Chief Complaint / Patient Profile   72 y.o. y/o male with a h/o MR s/p MVR (repair 12/2021), no CAD on angiography 2023, HTN, HLD, asthma, family history of CAD, and tobacco abuse who is pending left TKA and presents today for telephonic preoperative cardiovascular risk assessment.  History of Present Illness    Frank [name] is a 72 y.o. male who presents via audio/video conferencing for a telehealth visit today.  Pt was last seen in cardiology clinic on 10/05/22 by Margarite Gouge NP.  At that time Frank [name] was doing well .  The patient is now pending procedure as outlined above. Since his last visit, he has primarily been limited by knee pain. He can complete 4.0 METS without angina or SOB.   Past Medical History    Past Medical History:  Diagnosis Date   Allergy    Asthma    Coronary artery disease    GERD (gastroesophageal reflux disease)    Headache(784.0)    Hypertension    Past Surgical History:  Procedure Laterality Date   [place],  2009   kidney stones  05/20/1995   KNEE SURGERY  02/26/1989   MITRAL VALVE REPAIR  01/02/2022   NASAL SINUS SURGERY  10/24/1992   RIGHT/LEFT HEART CATH AND CORONARY ANGIOGRAPHY N/A 11/15/2021   Procedure: RIGHT/LEFT HEART CATH AND CORONARY ANGIOGRAPHY;  Surgeon: [name], MD;  Location: [place] [facility];  Service: Cardiovascular;  Laterality: N/A;   ROTATOR CUFF REPAIR Left 04/25/1993   TEE WITHOUT CARDIOVERSION N/A 08/24/2021   Procedure: TRANSESOPHAGEAL ECHOCARDIOGRAM (TEE);  Surgeon: Buford Dresser, MD;  [facility] ENDOSCOPY;  Service: Cardiovascular;  Laterality: N/A;    Allergies  Allergies  Allergen Reactions   Meloxicam Hives and Shortness Of Breath   Atorvastatin Other (See Comments)    Joint pain   Metoprolol Rash    Weakness / fatigue    Tomato     Mouth breaks out    Home Medications    Prior to Admission medications   Medication Sig Start Date End Date Taking? Authorizing Provider  acetaminophen (TYLENOL) 500 MG tablet Take 1,000 mg by mouth daily.    [provider]  ascorbic acid (VITAMIN C) 500 MG tablet Take 500 mg by mouth daily.    [provider]  aspirin EC 81 MG tablet Take 1 tablet (81 mg total) by mouth daily. Swallow whole. 08/21/21   [name], MD  cholecalciferol (VITAMIN D) 25 MCG (1000 UNIT) tablet Take 1,000 Units by mouth daily.    [provider]  fluticasone (FLONASE) 50 MCG/ACT nasal spray Place 1 spray into both nostrils daily as needed for allergies or rhinitis.    [provider]  furosemide (LASIX) 20 MG tablet Take 40 mg by mouth 2 (two) times daily.    [provider]  potassium chloride SA (KLOR-CON M) 20 MEQ tablet Take 20 mEq by mouth daily.    [provider]  tamsulosin (FLOMAX) 0.4 MG CAPS capsule TAKE 1 CAPSULE BY MOUTH EVERY DAY 11/02/22   [name], MD  triamcinolone (KENALOG) 0.1 % paste Apply to affected area 1-2 times daily Patient taking  differently: 1 application  daily as needed (mouth sores). 06/23/21   Inda Coke, PA  vitamin B-12 (CYANOCOBALAMIN) 1000 MCG tablet Take 1,000 mcg by mouth daily.    [provider]  Zinc 50 MG CAPS Take 50 mg by mouth daily.    [provider]    Physical Exam    Vital Signs:  Frank [name] does not have vital signs available for review today.  Given telephonic nature of communication, physical exam is limited. AAOx3. NAD. Normal affect.  Speech and respirations are unlabored.  Accessory Clinical Findings    None  Assessment & Plan    1.  Preoperative Cardiovascular Risk Assessment:  He does not have a history of ischemic heart disease or stroke. According to the RCRI, he is at low risk to proceed with surgery (0.4% risk of MACE). We have good data regarding CAD and valvular function. He understands this risk ans wishes to proceed.   Given no history of PCI and plan for spinal anesthesia, he may hold ASA 5-7 days prior to surgery.  Therefore, based on ACC/AHA guidelines, the patient would be at acceptable risk for the planned procedure without further cardiovascular testing.   The patient was advised that if he develops new symptoms prior to surgery to contact our office to arrange for a follow-up visit, and he verbalized understanding.   A copy of this note will be routed to requesting surgeon.  Time:   Today, I have spent 10 minutes with the patient with telehealth technology discussing medical history, symptoms, and management plan.     Tami Lin Roselina Burgueno, PA  12/13/2022, 10:46 AM

## 2022-12-13 NOTE — Anesthesia Preprocedure Evaluation (Addendum)
Anesthesia Evaluation  Patient identified by MRN, date of birth, ID band Patient awake    Reviewed: Allergy & Precautions, NPO status , Patient's Chart, lab work & pertinent test results  Airway Mallampati: II  TM Distance: >3 FB Neck ROM: Full    Dental  (+) Dental Advisory Given   Pulmonary shortness of breath, asthma , former smoker   Pulmonary exam normal breath sounds clear to auscultation       Cardiovascular hypertension, Pt. on home beta blockers and Pt. on medications + CAD  Normal cardiovascular exam+ Valvular Problems/Murmurs MR  Rhythm:Regular Rate:Normal  Echo 02/2022  1. Left ventricular ejection fraction, by estimation, is 50 to 55%. The left ventricle has low normal function. The left ventricle has no regional wall motion abnormalities. There is mild concentric left ventricular hypertrophy. Left ventricular  diastolic function could not be evaluated.   2. Right ventricular systolic function is normal. The right ventricular size is normal. There is normal pulmonary artery systolic pressure.   3. The mitral valve 26 Simulus ring is well seated. The mitral valve has been repaired. Mild mitral valve regurgitation. Functional mild mitral stenosis. The mean mitral valve gradient is 6.0 mmHg. There is a present in the mitral position. Procedure Date: 01/22/2022.   4. The aortic valve is tricuspid. Aortic valve regurgitation is not visualized. No aortic stenosis is present.   5. The inferior vena cava is normal in size with greater than 50% respiratory variability, suggesting right atrial pressure of 3 mmHg.   6. There is borderline dilatation of the aortic root, measuring 36 mm.     TTE 2022 1. Left ventricular ejection fraction, by estimation, is 55 to 60%. The  left ventricle has normal function. The left ventricle has no regional  wall motion abnormalities. There is mild left ventricular hypertrophy.  Left ventricular  diastolic parameters  are consistent with Grade I diastolic dysfunction (impaired relaxation).  2. Right ventricular systolic function is normal. The right ventricular  size is mildly enlarged. Tricuspid regurgitation signal is inadequate for  assessing PA pressure.  3. There is an eccentric, anteriorly directed jet of mitral valve  regurgitation. There is posterior mitral leaflet prolapse. Given degree of  jet eccentricity, cannot exclude small flail segment though it is not well  visualized on this exam.. The mitral  valve is abnormal. Moderate to severe mitral valve regurgitation. No  evidence of mitral stenosis.  4. The aortic valve is grossly normal. There is mild calcification of the  aortic valve. Aortic valve regurgitation is trivial. No aortic stenosis is  present.  5. Aortic dilatation noted. There is mild dilatation of the aortic root,  measuring 41 mm.  6. The inferior vena cava is normal in size with greater than 50%  respiratory variability, suggesting right atrial pressure of 3 mmHg.    Neuro/Psych  Headaches  negative psych ROS   GI/Hepatic Neg liver ROS,GERD  ,,  Endo/Other  negative endocrine ROS    Renal/GU negative Renal ROS     Musculoskeletal  (+) Arthritis ,    Abdominal  (+) + obese  Peds  Hematology negative hematology ROS (+)   Anesthesia Other Findings   Reproductive/Obstetrics                             Anesthesia Physical Anesthesia Plan  ASA: 3  Anesthesia Plan: Spinal   Post-op Pain Management: Tylenol PO (pre-op)* and Regional block*   Induction:  Intravenous  PONV Risk Score and Plan: Propofol infusion, Treatment may vary due to age or medical condition, Ondansetron and Dexamethasone  Airway Management Planned: Natural Airway  Additional Equipment:   Intra-op Plan:   Post-operative Plan:   Informed Consent: I have reviewed the patients History and Physical, chart, labs and discussed the  procedure including the risks, benefits and alternatives for the proposed anesthesia with the patient or authorized representative who has indicated his/her understanding and acceptance.     Dental advisory given  Plan Discussed with: CRNA  Anesthesia Plan Comments: (PAT note written by Myra Gianotti, PA-C on 12/10/2022  )        Anesthesia Quick Evaluation

## 2022-12-17 ENCOUNTER — Telehealth: Payer: Self-pay | Admitting: *Deleted

## 2022-12-17 NOTE — Telephone Encounter (Signed)
Ortho bundle pre-op call completed. 

## 2022-12-17 NOTE — Care Plan (Signed)
[facility] RNCM call to patient to discuss his upcoming Left total knee arthroplasty with Dr. Marlou Sa on 12/18/22. He is an Ortho bundle patient through [facility] and is agreeable to case management. He lives with his wife, who will be assisting at Discharge. He has a home CPM, RW for this surgery. Anticipate HHPT will be needed after a short hospital stay. Referral made to [facility] after choice provided. Reviewed all post op care instructions. Will continue to follow for needs.

## 2022-12-18 ENCOUNTER — Ambulatory Visit (HOSPITAL_BASED_OUTPATIENT_CLINIC_OR_DEPARTMENT_OTHER): Payer: Medicare Other | Admitting: Anesthesiology

## 2022-12-18 ENCOUNTER — Other Ambulatory Visit: Payer: Self-pay

## 2022-12-18 ENCOUNTER — Encounter (HOSPITAL_COMMUNITY): Payer: Self-pay | Admitting: Orthopedic Surgery

## 2022-12-18 ENCOUNTER — Observation Stay (HOSPITAL_COMMUNITY)
Admission: RE | Admit: 2022-12-18 | Discharge: 2022-12-19 | Disposition: A | Discharge status: Home / Self Care | Payer: Medicare Other | Attending: Orthopedic Surgery | Admitting: Orthopedic Surgery

## 2022-12-18 ENCOUNTER — Encounter (HOSPITAL_COMMUNITY)
Admission: RE | Disposition: A | Discharge status: Home / Self Care | Payer: Self-pay | Source: Home / Self Care | Attending: Orthopedic Surgery

## 2022-12-18 ENCOUNTER — Ambulatory Visit (HOSPITAL_COMMUNITY): Payer: Medicare Other | Admitting: Vascular Surgery

## 2022-12-18 DIAGNOSIS — Z01818 Encounter for other preprocedural examination: Secondary | ICD-10-CM

## 2022-12-18 DIAGNOSIS — M1712 Unilateral primary osteoarthritis, left knee: Secondary | ICD-10-CM

## 2022-12-18 DIAGNOSIS — I10 Essential (primary) hypertension: Secondary | ICD-10-CM | POA: Insufficient documentation

## 2022-12-18 DIAGNOSIS — J45909 Unspecified asthma, uncomplicated: Secondary | ICD-10-CM | POA: Insufficient documentation

## 2022-12-18 DIAGNOSIS — Z87891 Personal history of nicotine dependence: Secondary | ICD-10-CM | POA: Diagnosis not present

## 2022-12-18 DIAGNOSIS — G8918 Other acute postprocedural pain: Secondary | ICD-10-CM | POA: Diagnosis not present

## 2022-12-18 DIAGNOSIS — I2510 Atherosclerotic heart disease of native coronary artery without angina pectoris: Secondary | ICD-10-CM | POA: Diagnosis not present

## 2022-12-18 DIAGNOSIS — M1710 Unilateral primary osteoarthritis, unspecified knee: Secondary | ICD-10-CM | POA: Diagnosis present

## 2022-12-18 DIAGNOSIS — M1711 Unilateral primary osteoarthritis, right knee: Secondary | ICD-10-CM | POA: Diagnosis present

## 2022-12-18 HISTORY — PX: TOTAL KNEE ARTHROPLASTY: SHX125

## 2022-12-18 SURGERY — ARTHROPLASTY, KNEE, TOTAL
Anesthesia: Spinal | Site: Knee | Laterality: Left

## 2022-12-18 MED ORDER — POVIDONE-IODINE 10 % EX SWAB: 2.0000 | Freq: Once | CUTANEOUS | Status: DC

## 2022-12-18 MED ORDER — LACTATED RINGERS IV SOLN: INTRAVENOUS | Status: DC

## 2022-12-18 MED ORDER — PROPOFOL 10 MG/ML IV BOLUS
INTRAVENOUS | Status: DC | PRN
  Administered 2022-12-18: 30 mg via INTRAVENOUS

## 2022-12-18 MED ORDER — CHLORHEXIDINE GLUCONATE 0.12 % MT SOLN: 15.0000 mL | Freq: Once | OROMUCOSAL | Status: AC

## 2022-12-18 MED ORDER — TRANEXAMIC ACID-NACL 1000-0.7 MG/100ML-% IV SOLN
INTRAVENOUS | Status: AC
  Filled 2022-12-18: qty 100

## 2022-12-18 MED ORDER — VANCOMYCIN HCL 1000 MG IV SOLR
INTRAVENOUS | Status: DC | PRN
  Administered 2022-12-18: 1000 mg via TOPICAL

## 2022-12-18 MED ORDER — ONDANSETRON HCL 4 MG/2ML IJ SOLN: 4.0000 mg | Freq: Four times a day (QID) | INTRAMUSCULAR | Status: DC | PRN

## 2022-12-18 MED ORDER — METHOCARBAMOL 1000 MG/10ML IJ SOLN: 500.0000 mg | Freq: Four times a day (QID) | INTRAVENOUS | Status: DC | PRN

## 2022-12-18 MED ORDER — SODIUM CHLORIDE (PF) 0.9 % IJ SOLN
INTRAMUSCULAR | Status: DC | PRN
  Administered 2022-12-18: 60 mL via INTRAMUSCULAR

## 2022-12-18 MED ORDER — SODIUM CHLORIDE 0.9 % IR SOLN
Status: DC | PRN
  Administered 2022-12-18: 3000 mL

## 2022-12-18 MED ORDER — 0.9 % SODIUM CHLORIDE (POUR BTL) OPTIME
TOPICAL | Status: DC | PRN
  Administered 2022-12-18: 5000 mL

## 2022-12-18 MED ORDER — ORAL CARE MOUTH RINSE: 15.0000 mL | Freq: Once | OROMUCOSAL | Status: AC

## 2022-12-18 MED ORDER — CEFAZOLIN SODIUM-DEXTROSE 2-3 GM-%(50ML) IV SOLR
INTRAVENOUS | Status: DC | PRN
  Administered 2022-12-18: 2 g via INTRAVENOUS

## 2022-12-18 MED ORDER — ACETAMINOPHEN 325 MG PO TABS
325.0000 mg | ORAL_TABLET | Freq: Four times a day (QID) | ORAL | Status: DC | PRN
  Filled 2022-12-18: qty 2

## 2022-12-18 MED ORDER — DEXAMETHASONE SODIUM PHOSPHATE 4 MG/ML IJ SOLN
INTRAMUSCULAR | Status: DC | PRN
  Administered 2022-12-18: 5 mg via PERINEURAL

## 2022-12-18 MED ORDER — POVIDONE-IODINE 10 % EX SWAB
2.0000 | Freq: Once | CUTANEOUS | Status: AC
  Administered 2022-12-18: 2 via TOPICAL

## 2022-12-18 MED ORDER — TRANEXAMIC ACID 1000 MG/10ML IV SOLN
2000.0000 mg | INTRAVENOUS | Status: DC
  Filled 2022-12-18: qty 20

## 2022-12-18 MED ORDER — PHENYLEPHRINE HCL-NACL 20-0.9 MG/250ML-% IV SOLN
INTRAVENOUS | Status: DC | PRN
  Administered 2022-12-18: 35 ug/min via INTRAVENOUS
  Administered 2022-12-18: 75 ug/min via INTRAVENOUS
  Administered 2022-12-18: 40 ug/min via INTRAVENOUS

## 2022-12-18 MED ORDER — POVIDONE-IODINE 7.5 % EX SOLN: Freq: Once | CUTANEOUS | Status: AC

## 2022-12-18 MED ORDER — FENTANYL CITRATE (PF) 100 MCG/2ML IJ SOLN
INTRAMUSCULAR | Status: AC
  Filled 2022-12-18: qty 2

## 2022-12-18 MED ORDER — FUROSEMIDE 40 MG PO TABS
40.0000 mg | ORAL_TABLET | Freq: Two times a day (BID) | ORAL | Status: DC
  Administered 2022-12-18 – 2022-12-19 (×2): 40 mg via ORAL
  Filled 2022-12-18 (×2): qty 1

## 2022-12-18 MED ORDER — HYDROMORPHONE HCL 1 MG/ML IJ SOLN: 0.5000 mg | INTRAMUSCULAR | Status: DC | PRN

## 2022-12-18 MED ORDER — ZINC SULFATE 220 (50 ZN) MG PO CAPS
220.0000 mg | ORAL_CAPSULE | Freq: Every day | ORAL | Status: DC
  Administered 2022-12-18 – 2022-12-19 (×2): 220 mg via ORAL
  Filled 2022-12-18 (×2): qty 1

## 2022-12-18 MED ORDER — PROPOFOL 500 MG/50ML IV EMUL
INTRAVENOUS | Status: DC | PRN
  Administered 2022-12-18: 100 ug/kg/min via INTRAVENOUS

## 2022-12-18 MED ORDER — CEFAZOLIN SODIUM-DEXTROSE 2-4 GM/100ML-% IV SOLN: 2.0000 g | INTRAVENOUS | Status: DC

## 2022-12-18 MED ORDER — TRANEXAMIC ACID 1000 MG/10ML IV SOLN
INTRAVENOUS | Status: DC | PRN
  Administered 2022-12-18: 2000 mg via TOPICAL

## 2022-12-18 MED ORDER — ROPIVACAINE HCL 5 MG/ML IJ SOLN
INTRAMUSCULAR | Status: DC | PRN
  Administered 2022-12-18: 30 mL via PERINEURAL

## 2022-12-18 MED ORDER — ONDANSETRON HCL 4 MG PO TABS: 4.0000 mg | ORAL_TABLET | Freq: Four times a day (QID) | ORAL | Status: DC | PRN

## 2022-12-18 MED ORDER — DOCUSATE SODIUM 100 MG PO CAPS
100.0000 mg | ORAL_CAPSULE | Freq: Two times a day (BID) | ORAL | Status: DC
  Administered 2022-12-18 – 2022-12-19 (×2): 100 mg via ORAL
  Filled 2022-12-18 (×2): qty 1

## 2022-12-18 MED ORDER — PROPOFOL 10 MG/ML IV BOLUS
INTRAVENOUS | Status: AC
  Filled 2022-12-18: qty 20

## 2022-12-18 MED ORDER — CLONIDINE HCL (ANALGESIA) 100 MCG/ML EP SOLN
EPIDURAL | Status: DC | PRN
  Administered 2022-12-18: 80 ug

## 2022-12-18 MED ORDER — MORPHINE SULFATE (PF) 4 MG/ML IV SOLN
INTRAVENOUS | Status: AC
  Filled 2022-12-18: qty 2

## 2022-12-18 MED ORDER — CHLORHEXIDINE GLUCONATE 0.12 % MT SOLN
OROMUCOSAL | Status: AC
  Administered 2022-12-18: 15 mL via OROMUCOSAL
  Filled 2022-12-18: qty 15

## 2022-12-18 MED ORDER — ESMOLOL HCL 100 MG/10ML IV SOLN
INTRAVENOUS | Status: AC
  Filled 2022-12-18: qty 10

## 2022-12-18 MED ORDER — CLONIDINE HCL (ANALGESIA) 100 MCG/ML EP SOLN
EPIDURAL | Status: AC
  Filled 2022-12-18: qty 10

## 2022-12-18 MED ORDER — METHOCARBAMOL 500 MG PO TABS
ORAL_TABLET | ORAL | Status: AC
  Filled 2022-12-18: qty 1

## 2022-12-18 MED ORDER — VITAMIN D 25 MCG (1000 UNIT) PO TABS
1000.0000 [IU] | ORAL_TABLET | Freq: Every day | ORAL | Status: DC
  Administered 2022-12-18 – 2022-12-19 (×2): 1000 [IU] via ORAL
  Filled 2022-12-18 (×2): qty 1

## 2022-12-18 MED ORDER — FENTANYL CITRATE (PF) 100 MCG/2ML IJ SOLN: 50.0000 ug | Freq: Once | INTRAMUSCULAR | Status: DC

## 2022-12-18 MED ORDER — PHENOL 1.4 % MT LIQD: 1.0000 | OROMUCOSAL | Status: DC | PRN

## 2022-12-18 MED ORDER — POTASSIUM CHLORIDE CRYS ER 20 MEQ PO TBCR
20.0000 meq | EXTENDED_RELEASE_TABLET | Freq: Every day | ORAL | Status: DC
  Administered 2022-12-18 – 2022-12-19 (×2): 20 meq via ORAL
  Filled 2022-12-18 (×2): qty 1

## 2022-12-18 MED ORDER — FLUTICASONE PROPIONATE 50 MCG/ACT NA SUSP: 1.0000 | Freq: Every day | NASAL | Status: DC | PRN

## 2022-12-18 MED ORDER — MIDAZOLAM HCL 2 MG/2ML IJ SOLN
INTRAMUSCULAR | Status: AC
  Filled 2022-12-18: qty 2

## 2022-12-18 MED ORDER — VANCOMYCIN HCL 1000 MG IV SOLR
INTRAVENOUS | Status: AC
  Filled 2022-12-18: qty 20

## 2022-12-18 MED ORDER — CEFAZOLIN SODIUM-DEXTROSE 2-4 GM/100ML-% IV SOLN
INTRAVENOUS | Status: AC
  Filled 2022-12-18: qty 100

## 2022-12-18 MED ORDER — MORPHINE SULFATE 4 MG/ML IJ SOLN
INTRAMUSCULAR | Status: DC | PRN
  Administered 2022-12-18: 13 mL via INTRAMUSCULAR

## 2022-12-18 MED ORDER — TRANEXAMIC ACID-NACL 1000-0.7 MG/100ML-% IV SOLN
1000.0000 mg | INTRAVENOUS | Status: AC
  Administered 2022-12-18: 1000 mg via INTRAVENOUS

## 2022-12-18 MED ORDER — VITAMIN C 500 MG PO TABS
500.0000 mg | ORAL_TABLET | Freq: Every day | ORAL | Status: DC
  Administered 2022-12-18 – 2022-12-19 (×2): 500 mg via ORAL
  Filled 2022-12-18 (×2): qty 1

## 2022-12-18 MED ORDER — CEFAZOLIN SODIUM-DEXTROSE 1-4 GM/50ML-% IV SOLN
1.0000 g | Freq: Four times a day (QID) | INTRAVENOUS | Status: AC
  Administered 2022-12-18 – 2022-12-19 (×2): 1 g via INTRAVENOUS
  Filled 2022-12-18 (×2): qty 50

## 2022-12-18 MED ORDER — VITAMIN B-12 1000 MCG PO TABS
1000.0000 ug | ORAL_TABLET | Freq: Every day | ORAL | Status: DC
  Administered 2022-12-18 – 2022-12-19 (×2): 1000 ug via ORAL
  Filled 2022-12-18 (×2): qty 1

## 2022-12-18 MED ORDER — BUPIVACAINE LIPOSOME 1.3 % IJ SUSP
INTRAMUSCULAR | Status: AC
  Filled 2022-12-18: qty 20

## 2022-12-18 MED ORDER — METOCLOPRAMIDE HCL 5 MG/ML IJ SOLN: 5.0000 mg | Freq: Three times a day (TID) | INTRAMUSCULAR | Status: DC | PRN

## 2022-12-18 MED ORDER — TAMSULOSIN HCL 0.4 MG PO CAPS
0.4000 mg | ORAL_CAPSULE | Freq: Every day | ORAL | Status: DC
  Administered 2022-12-18 – 2022-12-19 (×2): 0.4 mg via ORAL
  Filled 2022-12-18 (×2): qty 1

## 2022-12-18 MED ORDER — SODIUM CHLORIDE 0.9 % IV SOLN: INTRAVENOUS | Status: AC

## 2022-12-18 MED ORDER — MENTHOL 3 MG MT LOZG: 1.0000 | LOZENGE | OROMUCOSAL | Status: DC | PRN

## 2022-12-18 MED ORDER — IRRISEPT - 450ML BOTTLE WITH 0.05% CHG IN STERILE WATER, USP 99.95% OPTIME
TOPICAL | Status: DC | PRN
  Administered 2022-12-18: 450 mL

## 2022-12-18 MED ORDER — METHOCARBAMOL 500 MG PO TABS
500.0000 mg | ORAL_TABLET | Freq: Four times a day (QID) | ORAL | Status: DC | PRN
  Administered 2022-12-18 (×2): 500 mg via ORAL
  Filled 2022-12-18 (×2): qty 1

## 2022-12-18 MED ORDER — OXYCODONE HCL 5 MG PO TABS
ORAL_TABLET | ORAL | Status: AC
  Filled 2022-12-18: qty 2

## 2022-12-18 MED ORDER — NAPROXEN 250 MG PO TABS
250.0000 mg | ORAL_TABLET | Freq: Two times a day (BID) | ORAL | Status: DC
  Administered 2022-12-19: 250 mg via ORAL
  Filled 2022-12-18: qty 1

## 2022-12-18 MED ORDER — BUPIVACAINE HCL (PF) 0.25 % IJ SOLN
INTRAMUSCULAR | Status: AC
  Filled 2022-12-18: qty 30

## 2022-12-18 MED ORDER — ASPIRIN 81 MG PO CHEW
81.0000 mg | CHEWABLE_TABLET | Freq: Two times a day (BID) | ORAL | Status: DC
  Administered 2022-12-18 – 2022-12-19 (×2): 81 mg via ORAL
  Filled 2022-12-18 (×2): qty 1

## 2022-12-18 MED ORDER — OXYCODONE HCL 5 MG PO TABS
5.0000 mg | ORAL_TABLET | ORAL | Status: DC | PRN
  Administered 2022-12-18: 5 mg via ORAL
  Administered 2022-12-18 – 2022-12-19 (×3): 10 mg via ORAL
  Administered 2022-12-19: 5 mg via ORAL
  Filled 2022-12-18 (×2): qty 2
  Filled 2022-12-18: qty 1
  Filled 2022-12-18: qty 2

## 2022-12-18 MED ORDER — BUPIVACAINE IN DEXTROSE 0.75-8.25 % IT SOLN
INTRATHECAL | Status: DC | PRN
  Administered 2022-12-18: 2 mL via INTRATHECAL

## 2022-12-18 MED ORDER — METOCLOPRAMIDE HCL 5 MG PO TABS: 5.0000 mg | ORAL_TABLET | Freq: Three times a day (TID) | ORAL | Status: DC | PRN

## 2022-12-18 SURGICAL SUPPLY — 89 items
BAG COUNTER SPONGE SURGICOUNT (BAG) ×2 IMPLANT
BAG DECANTER FOR FLEXI CONT (MISCELLANEOUS) ×2 IMPLANT
BAG SPNG CNTER NS LX DISP (BAG) ×1
BANDAGE ESMARK 6X9 LF (GAUZE/BANDAGES/DRESSINGS) ×2 IMPLANT
BLADE SAG 18X100X1.27 (BLADE) ×2 IMPLANT
BLADE SAGITTAL (BLADE) ×1
BLADE SAGITTAL 13X1.27X60 (BLADE) IMPLANT
BLADE SAW THK.89X75X18XSGTL (BLADE) ×2 IMPLANT
BNDG CMPR 5X6 CHSV STRCH STRL (GAUZE/BANDAGES/DRESSINGS) ×1
BNDG CMPR 9X6 STRL LF SNTH (GAUZE/BANDAGES/DRESSINGS) ×1
BNDG CMPR MED 15X6 ELC VLCR LF (GAUZE/BANDAGES/DRESSINGS) ×1
BNDG COHESIVE 6X5 TAN ST LF (GAUZE/BANDAGES/DRESSINGS) ×2 IMPLANT
BNDG ELASTIC 6X15 VLCR STRL LF (GAUZE/BANDAGES/DRESSINGS) ×2 IMPLANT
BNDG ESMARK 6X9 LF (GAUZE/BANDAGES/DRESSINGS) ×1
BOWL SMART MIX CTS (DISPOSABLE) IMPLANT
CEMENT BONE SIMPLEX SPEEDSET (Cement) IMPLANT
CNTNR URN SCR LID CUP LEK RST (MISCELLANEOUS) ×2 IMPLANT
COMP FEM KNEE STD PS 7 LT (Joint) ×1 IMPLANT
COMP PATELLAR 10X35 METAL (Joint) ×1 IMPLANT
COMP TIB KNEE PS 0D LT (Joint) ×1 IMPLANT
COMPONENT FEM KNEE STD PS 7 LT (Joint) IMPLANT
COMPONENT PATELLAR 10X35 METAL (Joint) IMPLANT
COMPONENT TIB KNEE PS 0D LT (Joint) IMPLANT
CONT SPEC 4OZ STRL OR WHT (MISCELLANEOUS) ×1
COVER SURGICAL LIGHT HANDLE (MISCELLANEOUS) ×2 IMPLANT
CUFF TOURN SGL QUICK 34 (TOURNIQUET CUFF) ×1
CUFF TOURN SGL QUICK 42 (TOURNIQUET CUFF) IMPLANT
CUFF TRNQT CYL 34X4.125X (TOURNIQUET CUFF) ×2 IMPLANT
DRAPE INCISE IOBAN 66X45 STRL (DRAPES) IMPLANT
DRAPE ORTHO SPLIT 77X108 STRL (DRAPES) ×3
DRAPE SURG ORHT 6 SPLT 77X108 (DRAPES) ×6 IMPLANT
DRAPE U-SHAPE 47X51 STRL (DRAPES) ×2 IMPLANT
DRSG AQUACEL AG ADV 3.5X14 (GAUZE/BANDAGES/DRESSINGS) IMPLANT
DURAPREP 26ML APPLICATOR (WOUND CARE) ×4 IMPLANT
ELECT CAUTERY BLADE 6.4 (BLADE) ×2 IMPLANT
ELECT REM PT RETURN 9FT ADLT (ELECTROSURGICAL) ×1
ELECTRODE REM PT RTRN 9FT ADLT (ELECTROSURGICAL) ×2 IMPLANT
GAUZE SPONGE 4X4 12PLY STRL (GAUZE/BANDAGES/DRESSINGS) ×2 IMPLANT
GLOVE BIOGEL PI IND STRL 7.0 (GLOVE) ×2 IMPLANT
GLOVE BIOGEL PI IND STRL 8 (GLOVE) ×2 IMPLANT
GLOVE ECLIPSE 7.0 STRL STRAW (GLOVE) ×2 IMPLANT
GLOVE ECLIPSE 8.0 STRL XLNG CF (GLOVE) ×2 IMPLANT
GLOVE SURG ENC MOIS LTX SZ6.5 (GLOVE) ×6 IMPLANT
GOWN STRL REUS W/ TWL LRG LVL3 (GOWN DISPOSABLE) ×6 IMPLANT
GOWN STRL REUS W/TWL LRG LVL3 (GOWN DISPOSABLE) ×3
HANDPIECE INTERPULSE COAX TIP (DISPOSABLE) ×1
HDLS TROCR DRIL PIN KNEE 75 (PIN) ×1
HOOD PEEL AWAY T7 (MISCELLANEOUS) ×6 IMPLANT
IMMOBILIZER KNEE 20 (SOFTGOODS)
IMMOBILIZER KNEE 20 THIGH 36 (SOFTGOODS) IMPLANT
IMMOBILIZER KNEE 22 UNIV (SOFTGOODS) IMPLANT
IMMOBILIZER KNEE 24 THIGH 36 (MISCELLANEOUS) IMPLANT
IMMOBILIZER KNEE 24 UNIV (MISCELLANEOUS)
INSERT FIXED AS SZ 6-7 13 LT (Insert) ×2 IMPLANT
KIT BASIN OR (CUSTOM PROCEDURE TRAY) ×2 IMPLANT
KIT TURNOVER KIT B (KITS) ×2 IMPLANT
MANIFOLD NEPTUNE II (INSTRUMENTS) ×2 IMPLANT
NDL 22X1.5 STRL (OR ONLY) (MISCELLANEOUS) ×4 IMPLANT
NDL SPNL 18GX3.5 QUINCKE PK (NEEDLE) ×2 IMPLANT
NEEDLE 22X1.5 STRL (OR ONLY) (MISCELLANEOUS) ×2 IMPLANT
NEEDLE SPNL 18GX3.5 QUINCKE PK (NEEDLE) ×1 IMPLANT
NS IRRIG 1000ML POUR BTL (IV SOLUTION) ×4 IMPLANT
PACK TOTAL JOINT (CUSTOM PROCEDURE TRAY) ×2 IMPLANT
PAD ARMBOARD 7.5X6 YLW CONV (MISCELLANEOUS) ×4 IMPLANT
PAD CAST 4YDX4 CTTN HI CHSV (CAST SUPPLIES) ×2 IMPLANT
PADDING CAST COTTON 4X4 STRL (CAST SUPPLIES) ×1
PADDING CAST COTTON 6X4 STRL (CAST SUPPLIES) ×2 IMPLANT
PIN DRILL HDLS TROCAR 75 4PK (PIN) IMPLANT
SCREW FEMALE HEX FIX 25X2.5 (ORTHOPEDIC DISPOSABLE SUPPLIES) IMPLANT
SET HNDPC FAN SPRY TIP SCT (DISPOSABLE) ×2 IMPLANT
SPIKE FLUID TRANSFER (MISCELLANEOUS) ×2 IMPLANT
STEM POLY PAT PLY 35M KNEE (Knees) IMPLANT
STRIP CLOSURE SKIN 1/2X4 (GAUZE/BANDAGES/DRESSINGS) ×4 IMPLANT
SUCTION FRAZIER HANDLE 10FR (MISCELLANEOUS) ×1
SUCTION TUBE FRAZIER 10FR DISP (MISCELLANEOUS) ×2 IMPLANT
SUT MNCRL AB 3-0 PS2 18 (SUTURE) ×2 IMPLANT
SUT VIC AB 0 CT1 27 (SUTURE) ×5
SUT VIC AB 0 CT1 27XBRD ANBCTR (SUTURE) ×6 IMPLANT
SUT VIC AB 1 CT1 36 (SUTURE) ×10 IMPLANT
SUT VIC AB 2-0 CT1 27 (SUTURE) ×5
SUT VIC AB 2-0 CT1 TAPERPNT 27 (SUTURE) ×8 IMPLANT
SYR 30ML LL (SYRINGE) ×6 IMPLANT
SYR TB 1ML LUER SLIP (SYRINGE) ×2 IMPLANT
TOWEL GREEN STERILE (TOWEL DISPOSABLE) ×4 IMPLANT
TOWEL GREEN STERILE FF (TOWEL DISPOSABLE) ×4 IMPLANT
TRAY CATH 16FR W/PLASTIC CATH (SET/KITS/TRAYS/PACK) IMPLANT
TRAY CATH INTERMITTENT SS 16FR (CATHETERS) IMPLANT
WATER STERILE IRR 1000ML POUR (IV SOLUTION) IMPLANT
YANKAUER SUCT BULB TIP NO VENT (SUCTIONS) ×2 IMPLANT

## 2022-12-18 NOTE — Brief Op Note (Signed)
   12/18/2022  3:04 PM  PATIENT:  Frank [name]  72 y.o. male  PRE-OPERATIVE DIAGNOSIS:  left knee osteoarthritis  POST-OPERATIVE DIAGNOSIS:  left knee osteoarthritis  PROCEDURE:  Procedure(s): LEFT TOTAL KNEE ARTHROPLASTY  SURGEON:  Surgeon(s): Meredith Pel, MD  ASSISTANT: green rnfa  ANESTHESIA:   spinal  EBL: 100 ml    Total I/O In: 1950 [I.V.:1800; IV Piggyback:150] Out: 750 [Urine:700; Blood:50]  BLOOD ADMINISTERED: none  DRAINS: none   LOCAL MEDICATIONS USED: Marcaine morphine clonidine Exparel vancomycin powder  SPECIMEN:  No Specimen  COUNTS:  YES  TOURNIQUET:   Total Tourniquet Time Documented: Thigh (Left) - 108 minutes Total: Thigh (Left) - 108 minutes   DICTATION: .Other Dictation: Dictation Number [id number]  PLAN OF CARE: Admit for overnight observation  PATIENT DISPOSITION:  PACU - hemodynamically stable

## 2022-12-18 NOTE — Transfer of Care (Signed)
Immediate Anesthesia Transfer of Care Note  [name]  Procedure(s) Performed: LEFT TOTAL KNEE ARTHROPLASTY (Left: Knee)  Patient Location: PACU  Anesthesia Type:Spinal  Level of Consciousness: awake, alert , and oriented  Airway & Oxygen Therapy: Patient Spontanous Breathing  Post-op Assessment: Report given to RN, Post -op Vital signs reviewed and stable, and Patient able to stick tongue midline  Post vital signs: Reviewed  Last Vitals:  Vitals Value Taken Time  BP 103/76 12/18/22 1445  Temp 97.6   Pulse 69 12/18/22 1446  Resp 16 12/18/22 1446  SpO2 95 % 12/18/22 1446  Vitals shown include unvalidated device data.  Last Pain:  Vitals:   12/18/22 1026  TempSrc:   PainSc: 0-No pain         Complications: No notable events documented.

## 2022-12-18 NOTE — Progress Notes (Signed)
Patient removed from CPM and knee immobilizer and bone foam placed

## 2022-12-18 NOTE — Progress Notes (Signed)
Orthopedic Tech Progress Note Patient Details:  Frank [name] 1951-09-19 [id number]  Ortho Devices Type of Ortho Device: Bone foam zero knee Ortho Device/Splint Location: LLE Ortho Device/Splint Interventions: Ordered, Application   Post Interventions Patient Tolerated: Well Instructions Provided: Care of device, Adjustment of device  [name] 12/18/2022, 4:47 PM

## 2022-12-18 NOTE — H&P (Signed)
TOTAL KNEE ADMISSION H&P  Patient is being admitted for left total knee arthroplasty.  Subjective:  Chief Complaint:left knee pain.  HPI: Frank [name], 72 y.o. male, has a history of pain and functional disability in the left knee due to arthritis and has failed non-surgical conservative treatments for greater than 12 weeks to includeNSAID's and/or analgesics, corticosteriod injections, viscosupplementation injections, flexibility and strengthening excercises, use of assistive devices, and activity modification.  Onset of symptoms was gradual, starting >10 years ago with gradually worsening course since that time. The patient noted prior procedures on the knee to include  arthroscopy on the left knee(s).  Patient currently rates pain in the left knee(s) at 9 out of 10 with activity. Patient has night pain, worsening of pain with activity and weight bearing, pain that interferes with activities of daily living, pain with passive range of motion, crepitus, and joint swelling.  Patient has evidence of subchondral sclerosis, periarticular osteophytes, and joint space narrowing by imaging studies. This patient has had  an exhaustive course of nonoperative therapy which she has failed.  No personal or family history of DVT or pulmonary embolism. . There is no active infection.  Patient Active Problem List   Diagnosis Date Noted   Nonrheumatic mitral valve regurgitation s/p repair 2023    Peroneal tendinitis of right lower extremity 04/25/2021   Benign localized prostatic hyperplasia with lower urinary tract symptoms (LUTS) 09/27/2020   Wheezing 09/14/2020   Chondrocalcinosis 03/14/2020   Dyslipidemia 09/23/2017   Bilateral primary osteoarthritis of knee 09/02/2017   Neck pain 02/15/2017   Primary osteoarthritis of right hip 02/15/2017   Tear of right acetabular labrum 02/15/2017   Right leg pain 01/04/2017   Obese 08/17/2015   Elevated blood sugar 08/17/2015   Shingles 09/10/2014   Dyspnea  [id number]   HERNIA, UMBILICAL [id number]   Essential hypertension 01/30/2008   Allergic rhinitis 01/30/2008   GERD 01/30/2008   HEADACHE 01/30/2008   Past Medical History:  Diagnosis Date   Allergy    Asthma    Coronary artery disease    GERD (gastroesophageal reflux disease)    Headache(784.0)    Hypertension     Past Surgical History:  Procedure Laterality Date   [place], 2009   kidney stones  05/20/1995   KNEE SURGERY  02/26/1989   MITRAL VALVE REPAIR  01/02/2022   NASAL SINUS SURGERY  10/24/1992   RIGHT/LEFT HEART CATH AND CORONARY ANGIOGRAPHY N/A 11/15/2021   Procedure: RIGHT/LEFT HEART CATH AND CORONARY ANGIOGRAPHY;  Surgeon: [name], MD;  Location: [facility];  Service: Cardiovascular;  Laterality: N/A;   ROTATOR CUFF REPAIR Left 04/25/1993   TEE WITHOUT CARDIOVERSION N/A 08/24/2021   Procedure: TRANSESOPHAGEAL ECHOCARDIOGRAM (TEE);  Surgeon: Buford Dresser, MD;  Location: [facility] ENDOSCOPY;  Service: Cardiovascular;  Laterality: N/A;    Current Facility-Administered Medications  Medication Dose Route Frequency Provider Last Rate Last Admin   ceFAZolin (ANCEF) 2-4 GM/100ML-% IVPB            ceFAZolin (ANCEF) IVPB 2g/100 mL premix  2 g Intravenous On Call to OR Magnant, Lyndel L, PA-C       fentaNYL (SUBLIMAZE) 100 MCG/2ML injection            lactated ringers infusion   Intravenous Continuous Myrtie Soman, MD 10 mL/hr at 12/18/22 1105 Continued from Pre-op at 12/18/22 1105   midazolam (VERSED) 2 MG/2ML injection  povidone-iodine 10 % swab 2 Application  2 Application Topical Once Magnant, Jerrie L, PA-C       tranexamic acid (CYKLOKAPRON) 1000MG/158m IVPB            tranexamic acid (CYKLOKAPRON) 2,000 mg in sodium chloride 0.9 % 50 mL Topical Application  2123XX123mg Topical To OR Magnant, Jaycen L, PA-C       tranexamic acid (CYKLOKAPRON) IVPB 1,000 mg  1,000 mg Intravenous To OR Magnant, Mani  L, PA-C       Allergies  Allergen Reactions   Meloxicam Hives and Shortness Of Breath   Atorvastatin Other (See Comments)    Joint pain   Metoprolol Rash    Weakness / fatigue    Tomato     Mouth breaks out    Social History   Tobacco Use   Smoking status: Former   Smokeless tobacco: Never   Tobacco comments:    quit in 1972  Substance Use Topics   Alcohol use: No    Family History  Problem Relation Age of Onset   Kidney disease Mother    COPD Father    Pneumonia Father    Cardiomyopathy Brother    Hypertension Sister    Heart disease Brother    Hypertension Brother    Hypertension Brother    Hypertension Sister    Diabetes Sister    Osteoarthritis Sister      Review of Systems  Musculoskeletal:  Positive for arthralgias.  All other systems reviewed and are negative.   Objective:  Physical Exam Vitals reviewed.  HENT:     Head: Normocephalic.     Nose: Nose normal.     Mouth/Throat:     Mouth: Mucous membranes are moist.  Eyes:     Pupils: Pupils are equal, round, and reactive to light.  Cardiovascular:     Rate and Rhythm: Normal rate.     Pulses: Normal pulses.  Pulmonary:     Effort: Pulmonary effort is normal.  Abdominal:     General: Abdomen is flat.  Musculoskeletal:     Cervical back: Normal range of motion.  Skin:    General: Skin is warm.     Capillary Refill: Capillary refill takes less than 2 seconds.  Neurological:     General: No focal deficit present.     Mental Status: He is alert.  Psychiatric:        Mood and Affect: Mood normal.    Ortho exam demonstrates varus alignment.  Flexion contracture of at least 5 degrees present in both knees.  Patient is able to bend both knees to around 100-1 05.  Extensor mechanism intact.  No masses lymphadenopathy or skin changes noted in either the right or left leg region.  No nerve root tension signs. Skin intact in the left knee region Vital signs in last 24 hours: Temp:  [98.4 F (36.9  C)] 98.4 F (36.9 C) (02/20 0958) Pulse Rate:  [96] 96 (02/20 0958) Resp:  [18] 18 (02/20 0958) BP: (173)/(95) 173/95 (02/20 0958) SpO2:  [98 %] 98 % (02/20 0958) Weight:  [78.5 kg] 78.5 kg (02/20 0958)  Labs:   Estimated body mass index is 31.64 kg/m as calculated from the following:   Height as of this encounter: 5' 2"$  (1.575 m).   Weight as of this encounter: 78.5 kg.   Imaging Review Plain radiographs demonstrate severe degenerative joint disease of the bilaterally knee(s). The overall alignment ismild varus. The bone quality appears to be  good for age and reported activity level.      Assessment/Plan:  End stage arthritis, left knee   The patient history, physical examination, clinical judgment of the provider and imaging studies are consistent with end stage degenerative joint disease of the left knee(s) and total knee arthroplasty is deemed medically necessary. The treatment options including medical management, injection therapy arthroscopy and arthroplasty were discussed at length. The risks and benefits of total knee arthroplasty were presented and reviewed. The risks due to aseptic loosening, infection, stiffness, patella tracking problems, thromboembolic complications and other imponderables were discussed. The patient acknowledged the explanation, agreed to proceed with the plan and consent was signed. Patient is being admitted for inpatient treatment for surgery, pain control, PT, OT, prophylactic antibiotics, VTE prophylaxis, progressive ambulation and ADL's and discharge planning. The patient is planning to be discharged home with home health services     Patient's anticipated LOS is less than 2 midnights, meeting these requirements: - Younger than 40 - Lives within 1 hour of care - Has a competent adult at home to recover with post-op recover - NO history of  - Chronic pain requiring opiods  - Diabetes  - Coronary Artery Disease  - Heart failure  - Heart  attack  - Stroke  - DVT/VTE  - Cardiac arrhythmia  - Respiratory Failure/COPD  - Renal failure  - Anemia  - Advanced Liver disease

## 2022-12-18 NOTE — Anesthesia Procedure Notes (Signed)
Anesthesia Regional Block: Adductor canal block   Pre-Anesthetic Checklist: , timeout performed,  Correct Patient, Correct Site, Correct Laterality,  Correct Procedure, Correct Position, site marked,  Risks and benefits discussed,  Surgical consent,  Pre-op evaluation,  At surgeon's request and post-op pain management  Laterality: Lower and Left  Prep: chloraprep       Needles:  Injection technique: Single-shot  Needle Type: Stimiplex     Needle Length: 9cm  Needle Gauge: 21     Additional Needles:   Procedures:,,,, ultrasound used (permanent image in chart),,    Narrative:  Start time: 12/18/2022 10:50 AM End time: 12/18/2022 11:10 AM Injection made incrementally with aspirations every 5 mL.  Performed by: Personally  Anesthesiologist: Nolon Nations, MD  Additional Notes: BP cuff, EKG monitors applied. Sedation begun. Artery and nerve location verified with ultrasound. Anesthetic injected incrementally (73m), slowly, and after negative aspirations under direct u/s guidance. Good fascial/perineural spread. Tolerated well.

## 2022-12-18 NOTE — Op Note (Unsigned)
Frank [name], Frank [name] MEDICAL RECORD NO: [id number] ACCOUNT NO: [id number] DATE OF BIRTH: [birth year] FACILITY: [facility] LOCATION: [facility] PHYSICIAN: Yetta Barre. Marlou Sa, MD  Operative Report   DATE OF PROCEDURE: 12/18/2022  PREOPERATIVE DIAGNOSIS:  Left knee arthritis.  POSTOPERATIVE DIAGNOSIS:  Left knee arthritis.  PROCEDURE:  Left total knee replacement using Zimmer Persona components cruciate retaining size left 7 standard femur, cemented with all poly 35 mm cemented patella, size F cemented tibia and a 13 mm medial congruent polyethylene spacer.  SURGEON:  Yetta Barre. Marlou Sa, MD  ASSISTANT:  April Green, RNFA.  INDICATIONS:  This is a 72 year old patient with end-stage left knee arthritis, who presents for operative management after explanation of risks and benefits.  DESCRIPTION OF PROCEDURE:  The patient was brought to the operating room where spinal anesthetic was induced.  Preoperative antibiotics administered.  Timeout was called.  TXA was administered.  Left leg was prescrubbed with alcohol and Betadine, allowed  to air dry, prepped with DuraPrep solution and draped in sterile manner.  Ioban used to cover the operative field.  Preoperative range of motion was about 10 degree flexion contracture and 105 degrees of flexion.  Varus alignment was present.  The leg  was elevated and exsanguinated with the Esmarch wrap.  Tourniquet was inflated.  After calling timeout anterior approach to the knee was made.  Skin and subcutaneous tissue were sharply divided.  IrriSept solution utilized Median parapatellar arthrotomy  was made and marked with #1 Vicryl suture.  Patella was everted.  Fat pad was partially removed.  The patient had significant arthritis in all 3 compartments along with osteophytes.  Lateral patellofemoral ligament was released.  Soft tissue removed from  the anterior distal femur.  Patella was everted, knee was flexed.  ACL released.  Anterior horn lateral meniscus released.   Significant wear on the posteromedial aspect of the knee was present.  Hohmann retractor and posterior retractor were placed.   Then, using intramedullary alignment, the tibia was cut 2 mm off the most affected posteromedial tibial plateau.  Bone quality was reasonable.  Next, the femur was cut in 5 degrees of valgus using intramedullary alignment.  Revision cut made in order to  allow for full extension with a 10 and 12 mm spacer.  Next, the femur was sized to a size 7.  Anterior, posterior and chamfer cuts were made with 3 degrees of external rotation.  The tibia was then keel punched and rotational alignment with the medial  third of the tibial tubercle.  Trial components in position, the patient had full extension, full flexion with minimal liftoff using both a 12 and 13 mm spacer.  Patella was then cut down from 20-10 mm and, trial patella was placed.  The patient had very  good stability to varus and valgus stress.  Did have some laxity to varus stress as would be expected in a preoperative varus knee deformity.  Next, the patient's alignment was intact.  Trial components were removed.  Thorough irrigation was performed.   The initial decision was for press-fit components, but after making the cuts the bone quality was good, but not necessarily good enough fully around the femur and tibia in order to warrant press fitting without undue risk.  For that reason, the  components were cemented into position.  Same stability parameters were maintained.  A 13 mm spacer was utilized, which improved stability, but did not decrease the flexion gap.  A Tourniquet was released.  Bleeding points encountered controlled using  electrocautery.  It should be noted that prior to placing the components we allowed TXA to sit for 3 minutes within incision and also anesthetized the capsule using a combination of Marcaine, Exparel and saline.  Then, excess cement was removed.  Pouring  irrigation utilized.  The arthrotomy  was then closed over bolster using #1 Vicryl suture.  Prior to final closure, we irrigated the joint out again with IrriSept solution and placed in some vancomycin powder.  Final closure of the arthrotomy was  performed.  Then, the knee was injected with Marcaine, morphine, clonidine.  The skin was closed using a combination of 0 Vicryl suture, 2-0 Vicryl suture, and 3-0 Monocryl with Steri-Strips and Aquacel dressing applied.  Ace wrap followed by Gar Gibbon and  knee immobilizer was placed.  The patient tolerated the procedure well without immediate complication and transferred to the recovery room in stable condition.   PUS D: 12/18/2022 3:10:52 pm T: 12/18/2022 3:54:00 pm  JOB: [id number]

## 2022-12-18 NOTE — Anesthesia Procedure Notes (Signed)
Spinal  Patient location during procedure: OR Start time: 12/18/2022 11:33 AM End time: 12/18/2022 11:38 AM Reason for block: surgical anesthesia Staffing Performed: anesthesiologist  Anesthesiologist: Nolon Nations, MD Performed by: Nolon Nations, MD Authorized by: Nolon Nations, MD   Preanesthetic Checklist Completed: patient identified, IV checked, site marked, risks and benefits discussed, surgical consent, monitors and equipment checked, pre-op evaluation and timeout performed Spinal Block Patient position: sitting Prep: DuraPrep and site prepped and draped Patient monitoring: heart rate, continuous pulse ox and blood pressure Approach: right paramedian Location: L3-4 Injection technique: single-shot Needle Needle type: Spinocan  Needle gauge: 25 G Needle length: 9 cm Additional Notes Expiration date of kit checked and confirmed. Patient tolerated procedure well, without complications.

## 2022-12-19 DIAGNOSIS — Z87891 Personal history of nicotine dependence: Secondary | ICD-10-CM | POA: Diagnosis not present

## 2022-12-19 DIAGNOSIS — M1712 Unilateral primary osteoarthritis, left knee: Secondary | ICD-10-CM | POA: Diagnosis not present

## 2022-12-19 DIAGNOSIS — Z96652 Presence of left artificial knee joint: Secondary | ICD-10-CM | POA: Diagnosis not present

## 2022-12-19 DIAGNOSIS — I2510 Atherosclerotic heart disease of native coronary artery without angina pectoris: Secondary | ICD-10-CM | POA: Diagnosis not present

## 2022-12-19 DIAGNOSIS — J45909 Unspecified asthma, uncomplicated: Secondary | ICD-10-CM | POA: Diagnosis not present

## 2022-12-19 DIAGNOSIS — I10 Essential (primary) hypertension: Secondary | ICD-10-CM | POA: Diagnosis not present

## 2022-12-19 MED ORDER — ACETAMINOPHEN 325 MG PO TABS: 325.0000 mg | ORAL_TABLET | Freq: Four times a day (QID) | ORAL | 0 refills | Status: DC | PRN

## 2022-12-19 MED ORDER — OXYCODONE HCL 5 MG PO TABS: 5.0000 mg | ORAL_TABLET | ORAL | 0 refills | Status: DC | PRN

## 2022-12-19 MED ORDER — ASPIRIN 81 MG PO CHEW: 81.0000 mg | CHEWABLE_TABLET | Freq: Two times a day (BID) | ORAL | 0 refills | Status: DC

## 2022-12-19 MED ORDER — METHOCARBAMOL 500 MG PO TABS: 500.0000 mg | ORAL_TABLET | Freq: Four times a day (QID) | ORAL | 0 refills | Status: DC | PRN

## 2022-12-19 MED ORDER — DOCUSATE SODIUM 100 MG PO CAPS: 100.0000 mg | ORAL_CAPSULE | Freq: Two times a day (BID) | ORAL | 0 refills | Status: DC

## 2022-12-19 NOTE — Anesthesia Postprocedure Evaluation (Signed)
Anesthesia Post Note  Patient: HOSS PALERMO  Procedure(s) Performed: LEFT TOTAL KNEE ARTHROPLASTY (Left: Knee)     Patient location during evaluation: PACU Anesthesia Type: Spinal Level of consciousness: oriented and awake and alert Pain management: pain level controlled Vital Signs Assessment: post-procedure vital signs reviewed and stable Respiratory status: spontaneous breathing, respiratory function stable and patient connected to nasal cannula oxygen Cardiovascular status: blood pressure returned to baseline and stable Postop Assessment: no headache, no backache and no apparent nausea or vomiting Anesthetic complications: no  No notable events documented.  Last Vitals:  Vitals:   12/19/22 0838 12/19/22 1316  BP: 101/76 101/68  Pulse: 81 93  Resp: 17   Temp: 36.8 C 36.6 C  SpO2: 100% 100%    Last Pain:  Vitals:   12/19/22 1408  TempSrc:   PainSc: 4                  Kathlean Cinco S

## 2022-12-19 NOTE — Evaluation (Signed)
Physical Therapy Evaluation Patient Details Name: Frank [name] MRN: [id number] DOB: Dec 03, 1950 Today's Date: 12/19/2022  History of Present Illness  Pt is 72 yo male presenting for planned L TKA. PMH: Allergies, asthma, CAD, GERD, headaches, HTN.  Clinical Impression  Pt is presenting below baseline. Currently pt is Min A for bed mobility, Supervision for transfers and CGA for stairs/gait. Due to pt current functional status, home set up and available assistance recommending skilled physical therapy services per physician recommendation but pt would benefit from some physical therapy upon discharge from acute care hospital setting in order to prevent immobility and falls. Pt is cleared for discharge home from a physical therapy perspective with no concerns once medically stable.        Recommendations for follow up therapy are one component of a multi-disciplinary discharge planning process, led by the attending physician.  Recommendations may be updated based on patient status, additional functional criteria and insurance authorization.  Follow Up Recommendations Follow physician's recommendations for discharge plan and follow up therapies      Assistance Recommended at Discharge Intermittent Supervision/Assistance  Patient can return home with the following  A little help with walking and/or transfers;Help with stairs or ramp for entrance;Assistance with cooking/housework;Assist for transportation    Equipment Recommendations None recommended by PT  Recommendations for Other Services       Functional Status Assessment Patient has had a recent decline in their functional status and demonstrates the ability to make significant improvements in function in a reasonable and predictable amount of time.     Precautions / Restrictions Precautions Precautions: Knee Restrictions Weight Bearing Restrictions: No      Mobility  Bed Mobility Overal bed mobility: Needs Assistance Bed  Mobility: Supine to Sit, Sit to Supine     Supine to sit: Supervision Sit to supine: Min assist   General bed mobility comments: Pt requires increased time to get to sitting EOB without physical assistance and occasional verbal cues. Min A for sitting to supine due to weakness in the LLE. Patient Response: Cooperative  Transfers Overall transfer level: Needs assistance Equipment used: Rolling walker (2 wheels) Transfers: Sit to/from Stand Sit to Stand: Supervision           General transfer comment: Verbal cues for sit to stand from EOB for hand placement and safe sequencing.    Ambulation/Gait Ambulation/Gait assistance: Min guard Gait Distance (Feet): 150 Feet Assistive device: Rolling walker (2 wheels) Gait Pattern/deviations: Step-to pattern, Step-through pattern, Decreased step length - left, Decreased step length - right, Decreased dorsiflexion - left, Wide base of support, Antalgic, Decreased weight shift to left   Gait velocity interpretation: <1.31 ft/sec, indicative of household ambulator   General Gait Details: Pt has decreased L knee flexion during gait; improved with cues but continues with difficutly with toeing off with antalgic gait pattern and decreased step through on the R.  Stairs Stairs: Yes Stairs assistance: Min guard Stair Management: One rail Right, Forwards, Step to pattern Number of Stairs: 2 General stair comments: pt is Min Guard no LOB, good form following directions for sequencing for stairs.  Wheelchair Mobility    Modified Rankin (Stroke Patients Only)       Balance Overall balance assessment: Mild deficits observed, not formally tested         Pertinent Vitals/Pain Pain Assessment Pain Assessment: 0-10 Pain Score: 1  Pain Descriptors / Indicators: Aching Pain Intervention(s): Monitored during session    Home Living Family/patient expects to be discharged to::  Private residence Living Arrangements: Spouse/significant  other Available Help at Discharge: Family Type of Home: House Home Access: Stairs to enter Entrance Stairs-Rails: Right;Left;None Technical brewer of Steps: [place]: One [place]: Advice worker (2 wheels);Rollator (4 wheels);Other (comment) (3 wheel RW with wider wheels)      Prior Function Prior Level of Function : Independent/Modified Independent;Driving       Mobility Comments: Pt was using rollator the most prior to surgery but occasionally using RW to get use to it. ADLs Comments: Pt reports he was independent with ADlL"s.     Hand Dominance   Dominant Hand: Left    Extremity/Trunk Assessment   Upper Extremity Assessment Upper Extremity Assessment: Overall WFL for tasks assessed    Lower Extremity Assessment Lower Extremity Assessment: Overall WFL for tasks assessed;LLE deficits/detail LLE Deficits / Details: Recent L TKA    Cervical / Trunk Assessment Cervical / Trunk Assessment: Normal  Communication   Communication: No difficulties  Cognition Arousal/Alertness: Awake/alert Behavior During Therapy: WFL for tasks assessed/performed Overall Cognitive Status: Within Functional Limits for tasks assessed          General Comments General comments (skin integrity, edema, etc.): Pt spouse was present and very supportive throughout. States that she can provide minimal assist if needed. went over the TKA exercise sheet with pt and spouse and discussed the importance of mobility at home.    Exercises Total Joint Exercises Goniometric ROM: Extension (-8), Flexion 50 degrees   Assessment/Plan    PT Assessment Patient needs continued PT services  PT Problem List Decreased strength;Decreased balance;Decreased mobility;Pain       PT Treatment Interventions DME instruction;Functional mobility training;Balance training;Patient/family education;Gait training;Therapeutic activities;Neuromuscular re-education;Therapeutic  exercise;Stair training;Manual techniques    PT Goals (Current goals can be found in the Care Plan section)  Acute Rehab PT Goals Patient Stated Goal: Go home PT Goal Formulation: With patient Time For Goal Achievement: 01/02/23 Potential to Achieve Goals: Good    Frequency 7X/week        AM-PAC PT "6 Clicks" Mobility  Outcome Measure Help needed turning from your back to your side while in a flat bed without using bedrails?: None Help needed moving from lying on your back to sitting on the side of a flat bed without using bedrails?: None Help needed moving to and from a bed to a chair (including a wheelchair)?: A Little Help needed standing up from a chair using your arms (e.g., wheelchair or bedside chair)?: A Little Help needed to walk in hospital room?: A Little Help needed climbing 3-5 steps with a railing? : A Little 6 Click Score: 20    End of Session Equipment Utilized During Treatment: Gait belt Activity Tolerance: Patient tolerated treatment well Patient left: in bed;with call bell/phone within reach;with family/visitor present;in CPM Nurse Communication: Mobility status PT Visit Diagnosis: Unsteadiness on feet (R26.81);Other abnormalities of gait and mobility (R26.89)    Time: [id number] PT Time Calculation (min) (ACUTE ONLY): 61 min   Charges:   PT Evaluation $PT Eval Low Complexity: 1 Low PT Treatments $Gait Training: 8-22 mins $Therapeutic Exercise: 8-22 mins $Therapeutic Activity: 8-22 mins        Tomma Rakers, DPT, CLT  Acute Rehabilitation Services Office: [phone] (Secure chat preferred)   Ander Purpura 12/19/2022, 10:39 AM

## 2022-12-19 NOTE — Progress Notes (Signed)
Alert and oriented , verbalized understanding of dc instructions.all belongings and dc papers given to patient

## 2022-12-19 NOTE — Progress Notes (Signed)
  Subjective: Patient stable.  Pain controlled.  Has not been walking around in the room yet   Objective: Vital signs in last 24 hours: Temp:  [97.4 F (36.3 C)-98.4 F (36.9 C)] 97.7 F (36.5 C) (02/21 0305) Pulse Rate:  [60-96] 66 (02/21 0305) Resp:  [10-20] 16 (02/21 0305) BP: (90-173)/(70-103) 112/84 (02/21 0305) SpO2:  [92 %-100 %] 99 % (02/21 0305) Weight:  [78.5 kg] 78.5 kg (02/20 0958)  Intake/Output from previous day: 02/20 0701 - 02/21 0700 In: 2525 [I.V.:2325; IV Piggyback:200] Out: 2075 [Urine:2025; Blood:50] Intake/Output this shift: Total I/O In: -  Out: 500 [Urine:500]  Exam:  Sensation intact distally Intact pulses distally Dorsiflexion/Plantar flexion intact  Labs: No results for input(s): "HGB" in the last 72 hours. No results for input(s): "WBC", "RBC", "HCT", "PLT" in the last 72 hours. No results for input(s): "NA", "K", "CL", "CO2", "BUN", "CREATININE", "GLUCOSE", "CALCIUM" in the last 72 hours. No results for input(s): "LABPT", "INR" in the last 72 hours.  Assessment/Plan: Plan at this time is physical therapy this morning and this afternoon.  Continue CPM use 1 to 2 hours 3 times a day.  Anticipate discharge tomorrow morning after physical therapy works with Juanda Crumble on steps.  Patient has 3 steps at home.   Frank [name] 12/19/2022, 7:53 AM

## 2022-12-19 NOTE — TOC Initial Note (Signed)
Transition of Care [facility]) - Initial/Assessment Note    Patient Details  Name: Frank [name] MRN: [id number] Date of Birth: 26-Jul-1951  [facility] CM/SW Contact:    Sharin Mons, RN Phone Number: 12/19/2022, 10:56 AM  Clinical Narrative:                      - L TKA , 2/20 From home with wife. PTA independent with ADL's. States  wife is to assist with care one d/c. Pt without DME needs. Home health services prearranged by provider with [facility] Pt states has transportation to home. Pt without RX med concerns. Post hospital f/u noted on AVS.   TOC team following and will assist with TOC needs as they presents.  Expected Discharge Plan: [place] Barriers to Discharge: Continued Medical Work up   Patient Goals and CMS Choice     Choice offered to / list presented to : Patient      Expected Discharge Plan and Services   Discharge Planning Services: CM Consult   Living arrangements for the past 2 months: Single Family Home Expected Discharge Date: 12/20/22                                    Prior Living Arrangements/Services Living arrangements for the past 2 months: Single Family Home Lives with:: Spouse Patient language and need for interpreter reviewed:: Yes Do you feel safe going back to the place where you live?: Yes      Need for Family Participation in Patient Care: Yes (Comment) Care giver support system in place?: Yes (comment)   Criminal Activity/Legal Involvement Pertinent to Current Situation/Hospitalization: No - Comment as needed  Activities of Daily Living Home Assistive Devices/Equipment: Walker (specify type) ADL Screening (condition at time of admission) Patient's cognitive ability adequate to safely complete daily activities?: Yes Is the patient deaf or have difficulty hearing?: No Does the patient have difficulty seeing, even when wearing glasses/contacts?: No Does the patient have  difficulty concentrating, remembering, or making decisions?: No Patient able to express need for assistance with ADLs?: Yes Does the patient have difficulty dressing or bathing?: No Independently performs ADLs?: Yes (appropriate for developmental age) Does the patient have difficulty walking or climbing stairs?: Yes Weakness of Legs: Both Weakness of Arms/Hands: None  Permission Sought/Granted   Permission granted to share information with : Yes, Verbal Permission Granted  Share Information with NAME: Saiyan Roccaforte  Spouse  [id number]           Emotional Assessment Appearance:: Appears stated age Attitude/Demeanor/Rapport: Engaged Affect (typically observed): Accepting Orientation: : Oriented to Self, Oriented to Place, Oriented to  Time, Oriented to Situation Alcohol / Substance Use: Not Applicable Psych Involvement: No (comment)  Admission diagnosis:  Arthritis of knee [M17.10] Patient Active Problem List   Diagnosis Date Noted   Arthritis of knee 12/18/2022   Nonrheumatic mitral valve regurgitation s/p repair 2023    Peroneal tendinitis of right lower extremity 04/25/2021   Benign localized prostatic hyperplasia with lower urinary tract symptoms (LUTS) 09/27/2020   Wheezing 09/14/2020   Chondrocalcinosis 03/14/2020   Dyslipidemia 09/23/2017   Bilateral primary osteoarthritis of knee 09/02/2017   Neck pain 02/15/2017   Primary osteoarthritis of right hip 02/15/2017   Tear of right acetabular labrum 02/15/2017   Right leg pain 01/04/2017   Obese 08/17/2015   Elevated blood  sugar 08/17/2015   Shingles 09/10/2014   Dyspnea 123XX123   HERNIA, UMBILICAL 123XX123   Essential hypertension 01/30/2008   Allergic rhinitis 01/30/2008   GERD 01/30/2008   HEADACHE 01/30/2008   PCP:  [name], MD Pharmacy:   [id number] [place] [address]Phone: [phone]: [phone] [id number]  [place]: [phone]: [phone]   Social Determinants of Health (SDOH) Social History: SDOH Screenings   Food Insecurity: No Food Insecurity (12/18/2022)  Housing: Low Risk  (12/18/2022)  Transportation Needs: No Transportation Needs (12/18/2022)  Utilities: Not At Risk (12/18/2022)  Depression (PHQ2-9): Low Risk  (11/15/2022)  Financial Resource Strain: Low Risk  (11/15/2022)  Physical Activity: Insufficiently Active (11/15/2022)  Social Connections: Moderately Isolated (11/15/2022)  Stress: No Stress Concern Present (11/15/2022)  Tobacco Use: Medium Risk (12/18/2022)   SDOH Interventions: Housing Interventions: Patient Refused   Readmission Risk Interventions     No data to display

## 2022-12-19 NOTE — Progress Notes (Signed)
Physical Therapy Treatment Patient Details Name: Frank [name] MRN: [id number] DOB: [birth year] Today's Date: 12/19/2022   History of Present Illness Pt is 72 yo male presenting for planned L TKA. PMH: Allergies, asthma, CAD, GERD, headaches, HTN.    PT Comments    Pt continues to present below baseline. Currently pt is Min A for bed mobility, Supervision for transfers and  stairs/gait. Due to pt current functional status, home set up and available assistance recommending skilled physical therapy services per physician recommendation but pt would benefit from some physical therapy upon discharge from acute care hospital setting in order to prevent immobility and falls. Pt is cleared for discharge home from a physical therapy perspective with no concerns once medically stable.       Recommendations for follow up therapy are one component of a multi-disciplinary discharge planning process, led by the attending physician.  Recommendations may be updated based on patient status, additional functional criteria and insurance authorization.  Follow Up Recommendations  Follow physician's recommendations for discharge plan and follow up therapies     Assistance Recommended at Discharge Intermittent Supervision/Assistance  Patient can return home with the following A little help with walking and/or transfers;Help with stairs or ramp for entrance;Assistance with cooking/housework;Assist for transportation   Equipment Recommendations  None recommended by PT       Precautions / Restrictions Precautions Precautions: Knee Restrictions Weight Bearing Restrictions: No     Mobility  Bed Mobility Overal bed mobility: Needs Assistance Bed Mobility: Supine to Sit, Sit to Supine     Supine to sit: Modified independent (Device/Increase time) Sit to supine: Min assist   General bed mobility comments: Pt requires increased time to get to sitting EOB without physical assistance. Min A for sitting to  supine due to weakness in the LLE. Patient Response: Cooperative  Transfers Overall transfer level: Needs assistance Equipment used: Rolling walker (2 wheels) Transfers: Sit to/from Stand Sit to Stand: Supervision           General transfer comment: Verbal cues for sit to stand from EOB for hand placement and safe sequencing.    Ambulation/Gait Ambulation/Gait assistance: Supervision Gait Distance (Feet): 150 Feet Assistive device: Rolling walker (2 wheels) Gait Pattern/deviations: Step-to pattern, Step-through pattern, Decreased step length - left, Decreased step length - right, Decreased dorsiflexion - left, Wide base of support, Antalgic, Decreased weight shift to left Gait velocity: Gait velocity increased from last session. Gait velocity interpretation: <1.8 ft/sec, indicate of risk for recurrent falls   General Gait Details: Pt has decreased L knee flexion during gait; improved with cues but continues with difficulty with toeing off with antalgic gait pattern and decreased step through on the R improved this session with greater knee flexion as pt ambulated.   Stairs Stairs: Yes Stairs assistance: Supervision Stair Management: One rail Right, Step to pattern, Sideways Number of Stairs: 3 General stair comments: Supervision, no LOB, Pt reports that he walks up/down stairs sideways at home.       Balance Overall balance assessment: Mild deficits observed, not formally tested          Cognition Arousal/Alertness: Awake/alert Behavior During Therapy: WFL for tasks assessed/performed Overall Cognitive Status: Within Functional Limits for tasks assessed          Exercises Total Joint Exercises Goniometric ROM: Extension (-8), Flexion 50 degrees        Pertinent Vitals/Pain Pain Assessment Pain Assessment: No/denies pain Pain Score: 1  Pain Descriptors / Indicators: Aching Pain Intervention(s): Monitored  during session     PT Goals (current goals can  now be found in the care plan section) Acute Rehab PT Goals Patient Stated Goal: Go home PT Goal Formulation: With patient Time For Goal Achievement: 01/02/23 Potential to Achieve Goals: Good Progress towards PT goals: Progressing toward goals    Frequency    7X/week      PT Plan Current plan remains appropriate       AM-PAC PT "6 Clicks" Mobility   Outcome Measure  Help needed turning from your back to your side while in a flat bed without using bedrails?: None Help needed moving from lying on your back to sitting on the side of a flat bed without using bedrails?: None Help needed moving to and from a bed to a chair (including a wheelchair)?: A Little Help needed standing up from a chair using your arms (e.g., wheelchair or bedside chair)?: A Little Help needed to walk in hospital room?: A Little Help needed climbing 3-5 steps with a railing? : A Little 6 Click Score: 20    End of Session Equipment Utilized During Treatment: Gait belt Activity Tolerance: Patient tolerated treatment well Patient left: in bed;with call bell/phone within reach;with family/visitor present Nurse Communication: Mobility status PT Visit Diagnosis: Unsteadiness on feet (R26.81);Other abnormalities of gait and mobility (R26.89)     Time: [id number] PT Time Calculation (min) (ACUTE ONLY): 25 min  Charges:  $Gait Training: 8-22 mins $Therapeutic Activity: 8-22 mins                     Tomma Rakers, DPT, CLT  Acute Rehabilitation Services Office: [phone] (Secure chat preferred)    Ander Purpura 12/19/2022, 3:07 PM

## 2022-12-20 ENCOUNTER — Telehealth: Payer: Self-pay | Admitting: *Deleted

## 2022-12-20 ENCOUNTER — Encounter (HOSPITAL_COMMUNITY): Payer: Self-pay | Admitting: Orthopedic Surgery

## 2022-12-20 DIAGNOSIS — Z7982 Long term (current) use of aspirin: Secondary | ICD-10-CM | POA: Diagnosis not present

## 2022-12-20 DIAGNOSIS — E785 Hyperlipidemia, unspecified: Secondary | ICD-10-CM | POA: Diagnosis not present

## 2022-12-20 DIAGNOSIS — Z87891 Personal history of nicotine dependence: Secondary | ICD-10-CM | POA: Diagnosis not present

## 2022-12-20 DIAGNOSIS — M1611 Unilateral primary osteoarthritis, right hip: Secondary | ICD-10-CM | POA: Diagnosis not present

## 2022-12-20 DIAGNOSIS — M1711 Unilateral primary osteoarthritis, right knee: Secondary | ICD-10-CM | POA: Diagnosis not present

## 2022-12-20 DIAGNOSIS — I2510 Atherosclerotic heart disease of native coronary artery without angina pectoris: Secondary | ICD-10-CM | POA: Diagnosis not present

## 2022-12-20 DIAGNOSIS — Z471 Aftercare following joint replacement surgery: Secondary | ICD-10-CM | POA: Diagnosis not present

## 2022-12-20 DIAGNOSIS — I10 Essential (primary) hypertension: Secondary | ICD-10-CM | POA: Diagnosis not present

## 2022-12-20 DIAGNOSIS — K219 Gastro-esophageal reflux disease without esophagitis: Secondary | ICD-10-CM | POA: Diagnosis not present

## 2022-12-20 DIAGNOSIS — J45909 Unspecified asthma, uncomplicated: Secondary | ICD-10-CM | POA: Diagnosis not present

## 2022-12-20 DIAGNOSIS — Z952 Presence of prosthetic heart valve: Secondary | ICD-10-CM | POA: Diagnosis not present

## 2022-12-20 DIAGNOSIS — Z9181 History of falling: Secondary | ICD-10-CM | POA: Diagnosis not present

## 2022-12-20 DIAGNOSIS — Z96652 Presence of left artificial knee joint: Secondary | ICD-10-CM | POA: Diagnosis not present

## 2022-12-20 NOTE — Telephone Encounter (Signed)
Ortho bundle D/C call completed. 

## 2022-12-21 ENCOUNTER — Telehealth: Payer: Self-pay | Admitting: *Deleted

## 2022-12-21 MED ORDER — OXYCODONE HCL 5 MG PO TABS: 5.0000 mg | ORAL_TABLET | Freq: Four times a day (QID) | ORAL | 0 refills | Status: DC | PRN

## 2022-12-21 NOTE — Telephone Encounter (Signed)
Rx sent to pharmacy   

## 2022-12-21 NOTE — Telephone Encounter (Signed)
Spoke to patient today and he's doing well since being home. Requested refill of pain medication by end of weekend, so wanted to go ahead and request. Pharmacy on chart. Thank you.

## 2022-12-22 DIAGNOSIS — E785 Hyperlipidemia, unspecified: Secondary | ICD-10-CM | POA: Diagnosis not present

## 2022-12-22 DIAGNOSIS — Z7982 Long term (current) use of aspirin: Secondary | ICD-10-CM | POA: Diagnosis not present

## 2022-12-22 DIAGNOSIS — I2510 Atherosclerotic heart disease of native coronary artery without angina pectoris: Secondary | ICD-10-CM | POA: Diagnosis not present

## 2022-12-22 DIAGNOSIS — I10 Essential (primary) hypertension: Secondary | ICD-10-CM | POA: Diagnosis not present

## 2022-12-22 DIAGNOSIS — M1711 Unilateral primary osteoarthritis, right knee: Secondary | ICD-10-CM | POA: Diagnosis not present

## 2022-12-22 DIAGNOSIS — K219 Gastro-esophageal reflux disease without esophagitis: Secondary | ICD-10-CM | POA: Diagnosis not present

## 2022-12-22 DIAGNOSIS — Z9181 History of falling: Secondary | ICD-10-CM | POA: Diagnosis not present

## 2022-12-22 DIAGNOSIS — Z87891 Personal history of nicotine dependence: Secondary | ICD-10-CM | POA: Diagnosis not present

## 2022-12-22 DIAGNOSIS — J45909 Unspecified asthma, uncomplicated: Secondary | ICD-10-CM | POA: Diagnosis not present

## 2022-12-22 DIAGNOSIS — Z96652 Presence of left artificial knee joint: Secondary | ICD-10-CM | POA: Diagnosis not present

## 2022-12-22 DIAGNOSIS — Z952 Presence of prosthetic heart valve: Secondary | ICD-10-CM | POA: Diagnosis not present

## 2022-12-22 DIAGNOSIS — Z471 Aftercare following joint replacement surgery: Secondary | ICD-10-CM | POA: Diagnosis not present

## 2022-12-22 DIAGNOSIS — M1611 Unilateral primary osteoarthritis, right hip: Secondary | ICD-10-CM | POA: Diagnosis not present

## 2022-12-24 DIAGNOSIS — M1611 Unilateral primary osteoarthritis, right hip: Secondary | ICD-10-CM | POA: Diagnosis not present

## 2022-12-24 DIAGNOSIS — I2510 Atherosclerotic heart disease of native coronary artery without angina pectoris: Secondary | ICD-10-CM | POA: Diagnosis not present

## 2022-12-24 DIAGNOSIS — E785 Hyperlipidemia, unspecified: Secondary | ICD-10-CM | POA: Diagnosis not present

## 2022-12-24 DIAGNOSIS — Z471 Aftercare following joint replacement surgery: Secondary | ICD-10-CM | POA: Diagnosis not present

## 2022-12-24 DIAGNOSIS — Z9181 History of falling: Secondary | ICD-10-CM | POA: Diagnosis not present

## 2022-12-24 DIAGNOSIS — Z952 Presence of prosthetic heart valve: Secondary | ICD-10-CM | POA: Diagnosis not present

## 2022-12-24 DIAGNOSIS — Z87891 Personal history of nicotine dependence: Secondary | ICD-10-CM | POA: Diagnosis not present

## 2022-12-24 DIAGNOSIS — M1711 Unilateral primary osteoarthritis, right knee: Secondary | ICD-10-CM | POA: Diagnosis not present

## 2022-12-24 DIAGNOSIS — Z7982 Long term (current) use of aspirin: Secondary | ICD-10-CM | POA: Diagnosis not present

## 2022-12-24 DIAGNOSIS — Z96652 Presence of left artificial knee joint: Secondary | ICD-10-CM | POA: Diagnosis not present

## 2022-12-24 DIAGNOSIS — I10 Essential (primary) hypertension: Secondary | ICD-10-CM | POA: Diagnosis not present

## 2022-12-24 DIAGNOSIS — K219 Gastro-esophageal reflux disease without esophagitis: Secondary | ICD-10-CM | POA: Diagnosis not present

## 2022-12-24 DIAGNOSIS — J45909 Unspecified asthma, uncomplicated: Secondary | ICD-10-CM | POA: Diagnosis not present

## 2022-12-24 NOTE — Discharge Summary (Signed)
Physician Discharge Summary      Patient ID: SOSAIA BEALES MRN: [id number] DOB/AGE: 1951/01/17 72 y.o.  Admit date: 12/18/2022 Discharge date: 12/19/2022  Admission Diagnoses:  Principal Problem:   Arthritis of knee   Discharge Diagnoses:  Same  Surgeries: Procedure(s): LEFT TOTAL KNEE ARTHROPLASTY on 12/18/2022   Consultants:   Discharged Condition: Stable  Hospital Course: REMIGIO KARAMAN is an 72 y.o. male who was admitted 12/18/2022 with a chief complaint of left knee pain, and found to have a diagnosis of Arthritis of knee.  They were brought to the operating room on 12/18/2022 and underwent the above named procedures.  Patient tolerated the procedure well.  Was seen by physical therapy on postop day #1 and was able to ambulate with minimal assistance as well as negotiate stairs.  He is discharged home in good condition.  He will follow-up in 2 weeks.  Home on aspirin for DVT prophylaxis along with pain medicine and muscle relaxers along with his preoperative medication.  Home health physical therapy to be utilized the first 2 weeks and then we will transition him to outpatient physical therapy.  Antibiotics given:  Anti-infectives (From admission, onward)    Start     Dose/Rate Route Frequency Ordered Stop   12/18/22 1845  ceFAZolin (ANCEF) IVPB 1 g/50 mL premix        1 g 100 mL/hr over 30 Minutes Intravenous Every 6 hours 12/18/22 1755 12/19/22 0146   12/18/22 1316  vancomycin (VANCOCIN) powder  Status:  Discontinued          As needed 12/18/22 1316 12/18/22 1442   12/18/22 1055  ceFAZolin (ANCEF) IVPB 2g/100 mL premix  Status:  Discontinued        2 g 200 mL/hr over 30 Minutes Intravenous On call to O.R. 12/18/22 0949 12/18/22 1746   12/18/22 0954  ceFAZolin (ANCEF) 2-4 GM/100ML-% IVPB       Note to Pharmacy: Alba Cory B: cabinet override      12/18/22 0954 12/18/22 2159     .  Recent vital signs:  Vitals:   12/19/22 0838 12/19/22 1316  BP: 101/76 101/68   Pulse: 81 93  Resp: 17   Temp: 98.3 F (36.8 C) 97.8 F (36.6 C)  SpO2: 100% 100%    Recent laboratory studies:  Results for orders placed or performed during the hospital encounter of 12/10/22  Urine Culture (for pregnant, neutropenic or urologic patients or patients with an indwelling urinary catheter)   Specimen: Urine, Clean Catch  Result Value Ref Range   Specimen Description URINE, CLEAN CATCH    Special Requests NONE    Culture (A)     <10,000 COLONIES/mL INSIGNIFICANT GROWTH Performed at [facility], [place] [address] [place], [place] [address]    Report Status 12/11/2022 FINAL   Surgical pcr screen   Specimen: Nasal Mucosa; Nasal Swab  Result Value Ref Range   MRSA, PCR NEGATIVE NEGATIVE   Staphylococcus aureus POSITIVE (A) NEGATIVE  CBC  Result Value Ref Range   WBC 8.0 4.0 - 10.5 K/uL   RBC 4.88 4.22 - 5.81 MIL/uL   Hemoglobin 15.1 13.0 - 17.0 g/dL   HCT 46.3 39.0 - 52.0 %   MCV 94.9 80.0 - 100.0 fL   MCH 30.9 26.0 - 34.0 pg   MCHC 32.6 30.0 - 36.0 g/dL   RDW 12.1 11.5 - 15.5 %   Platelets 256 150 - 400 K/uL   nRBC 0.0 0.0 - 0.2 %  Basic metabolic panel  Result Value Ref Range   Sodium 138 135 - 145 mmol/L   Potassium 3.8 3.5 - 5.1 mmol/L   Chloride 103 98 - 111 mmol/L   CO2 27 22 - 32 mmol/L   Glucose, Bld 120 (H) 70 - 99 mg/dL   BUN 16 8 - 23 mg/dL   Creatinine, Ser 0.95 0.61 - 1.24 mg/dL   Calcium 9.3 8.9 - 10.3 mg/dL   GFR, Estimated >60 >60 mL/min   Anion gap 8 5 - 15  Urinalysis, Complete w Microscopic -Urine, Clean Catch  Result Value Ref Range   Color, Urine YELLOW YELLOW   APPearance HAZY (A) CLEAR   Specific Gravity, Urine 1.013 1.005 - 1.030   pH 6.0 5.0 - 8.0   Glucose, UA NEGATIVE NEGATIVE mg/dL   Hgb urine dipstick NEGATIVE NEGATIVE   Bilirubin Urine NEGATIVE NEGATIVE   Ketones, ur NEGATIVE NEGATIVE mg/dL   Protein, ur NEGATIVE NEGATIVE mg/dL   Nitrite NEGATIVE NEGATIVE   Leukocytes,Ua NEGATIVE NEGATIVE   RBC / HPF 11-20 0  - 5 RBC/hpf   WBC, UA 0-5 0 - 5 WBC/hpf   Bacteria, UA RARE (A) NONE SEEN   Squamous Epithelial / HPF 0-5 0 - 5 /HPF   Mucus PRESENT    Hyaline Casts, UA PRESENT    Granular Casts, UA PRESENT     Discharge Medications:   Allergies as of 12/19/2022       Reactions   Meloxicam Hives, Shortness Of Breath   Atorvastatin Other (See Comments)   Joint pain   Metoprolol Rash   Weakness / fatigue    Tomato    Mouth breaks out        Medication List     TAKE these medications    acetaminophen 325 MG tablet Commonly known as: TYLENOL Take 1-2 tablets (325-650 mg total) by mouth every 6 (six) hours as needed for mild pain (pain score 1-3 or temp > 100.5). What changed:  medication strength how much to take when to take this reasons to take this   ascorbic acid 500 MG tablet Commonly known as: VITAMIN C Take 500 mg by mouth daily.   aspirin 81 MG chewable tablet Chew 1 tablet (81 mg total) by mouth 2 (two) times daily.   cholecalciferol 25 MCG (1000 UT) tablet Generic drug: Cholecalciferol Take 1,000 Units by mouth daily.   cyanocobalamin 1000 MCG tablet Commonly known as: VITAMIN B12 Take 1,000 mcg by mouth daily.   docusate sodium 100 MG capsule Commonly known as: COLACE Take 1 capsule (100 mg total) by mouth 2 (two) times daily.   fluticasone 50 MCG/ACT nasal spray Commonly known as: FLONASE Place 1 spray into both nostrils daily as needed for allergies or rhinitis.   furosemide 20 MG tablet Commonly known as: LASIX Take 40 mg by mouth 2 (two) times daily.   methocarbamol 500 MG tablet Commonly known as: ROBAXIN Take 1 tablet (500 mg total) by mouth every 6 (six) hours as needed for muscle spasms.   potassium chloride SA 20 MEQ tablet Commonly known as: KLOR-CON M Take 20 mEq by mouth daily.   tamsulosin 0.4 MG Caps capsule Commonly known as: FLOMAX TAKE 1 CAPSULE BY MOUTH EVERY DAY   triamcinolone 0.1 % paste Commonly known as: KENALOG Apply to  affected area 1-2 times daily What changed:  how much to take when to take this reasons to take this additional instructions   Zinc 50 MG Caps Take 50 mg  by mouth daily.        Diagnostic Studies: No results found.  Disposition: Discharge disposition: 01-Home or Self Care       Discharge Instructions     Call MD / Call 911   Complete by: As directed    If you experience chest pain or shortness of breath, CALL 911 and be transported to the hospital emergency room.  If you develope a fever above 101 F, pus (white drainage) or increased drainage or redness at the wound, or calf pain, call your surgeon's office.   Call MD / Call 911   Complete by: As directed    If you experience chest pain or shortness of breath, CALL 911 and be transported to the hospital emergency room.  If you develope a fever above 101 F, pus (white drainage) or increased drainage or redness at the wound, or calf pain, call your surgeon's office.   Constipation Prevention   Complete by: As directed    Drink plenty of fluids.  Prune juice may be helpful.  You may use a stool softener, such as Colace (over the counter) 100 mg twice a day.  Use MiraLax (over the counter) for constipation as needed.   Constipation Prevention   Complete by: As directed    Drink plenty of fluids.  Prune juice may be helpful.  You may use a stool softener, such as Colace (over the counter) 100 mg twice a day.  Use MiraLax (over the counter) for constipation as needed.   Diet - low sodium heart healthy   Complete by: As directed    Diet - low sodium heart healthy   Complete by: As directed    Discharge instructions   Complete by: As directed    CPM use 1 to 2 hours 3 times a day increasing the degrees slowly  Weightbearing as tolerated with walker  Okay to shower dressing is waterproof  When laying around at the house make sure to put your foot in the blue cradle boot to work on achieving full straightening of the leg    Increase activity slowly as tolerated   Complete by: As directed    Increase activity slowly as tolerated   Complete by: As directed    Post-operative opioid taper instructions:   Complete by: As directed    POST-OPERATIVE OPIOID TAPER INSTRUCTIONS: It is important to wean off of your opioid medication as soon as possible. If you do not need pain medication after your surgery it is ok to stop day one. Opioids include: Codeine, Hydrocodone(Norco, Vicodin), Oxycodone(Percocet, oxycontin) and hydromorphone amongst others.  Long term and even short term use of opiods can cause: Increased pain response Dependence Constipation Depression Respiratory depression And more.  Withdrawal symptoms can include Flu like symptoms Nausea, vomiting And more Techniques to manage these symptoms Hydrate well Eat regular healthy meals Stay active Use relaxation techniques(deep breathing, meditating, yoga) Do Not substitute Alcohol to help with tapering If you have been on opioids for less than two weeks and do not have pain than it is ok to stop all together.  Plan to wean off of opioids This plan should start within one week post op of your joint replacement. Maintain the same interval or time between taking each dose and first decrease the dose.  Cut the total daily intake of opioids by one tablet each day Next start to increase the time between doses. The last dose that should be eliminated is the evening dose.  Post-operative opioid taper instructions:   Complete by: As directed    POST-OPERATIVE OPIOID TAPER INSTRUCTIONS: It is important to wean off of your opioid medication as soon as possible. If you do not need pain medication after your surgery it is ok to stop day one. Opioids include: Codeine, Hydrocodone(Norco, Vicodin), Oxycodone(Percocet, oxycontin) and hydromorphone amongst others.  Long term and even short term use of opiods can cause: Increased pain  response Dependence Constipation Depression Respiratory depression And more.  Withdrawal symptoms can include Flu like symptoms Nausea, vomiting And more Techniques to manage these symptoms Hydrate well Eat regular healthy meals Stay active Use relaxation techniques(deep breathing, meditating, yoga) Do Not substitute Alcohol to help with tapering If you have been on opioids for less than two weeks and do not have pain than it is ok to stop all together.  Plan to wean off of opioids This plan should start within one week post op of your joint replacement. Maintain the same interval or time between taking each dose and first decrease the dose.  Cut the total daily intake of opioids by one tablet each day Next start to increase the time between doses. The last dose that should be eliminated is the evening dose.           Follow-up Information     [name], MD Follow up.   Specialty: Family Medicine Contact information: [place] [address] [phone]         [facility] [place] Follow up.   Specialty: Home Health Services Why: home health services will be provided by [facility], start of care within  48 hours post discharge Contact information: [place] [address] [phone]         [facility] Follow up.   Contact information: [facility] [address] [place], [place] [address] [phone]                 Signed: Anderson Malta 12/24/2022, 12:40 PM

## 2022-12-25 ENCOUNTER — Other Ambulatory Visit: Payer: Self-pay | Admitting: *Deleted

## 2022-12-25 ENCOUNTER — Telehealth: Payer: Self-pay | Admitting: *Deleted

## 2022-12-25 ENCOUNTER — Other Ambulatory Visit: Payer: Self-pay | Admitting: Surgical

## 2022-12-25 DIAGNOSIS — M170 Bilateral primary osteoarthritis of knee: Secondary | ICD-10-CM

## 2022-12-25 DIAGNOSIS — Z96652 Presence of left artificial knee joint: Secondary | ICD-10-CM

## 2022-12-25 MED ORDER — METHOCARBAMOL 500 MG PO TABS: 500.0000 mg | ORAL_TABLET | Freq: Four times a day (QID) | ORAL | 0 refills | Status: DC | PRN

## 2022-12-25 MED ORDER — OXYCODONE HCL 5 MG PO TABS: 5.0000 mg | ORAL_TABLET | Freq: Four times a day (QID) | ORAL | 0 refills | Status: DC | PRN

## 2022-12-25 NOTE — Telephone Encounter (Signed)
Patient needs refill of muscle relaxer now and pain medication by end of week. Thank you.

## 2022-12-25 NOTE — Telephone Encounter (Signed)
refilled 

## 2022-12-26 NOTE — Telephone Encounter (Signed)
thx

## 2022-12-27 ENCOUNTER — Encounter (HOSPITAL_COMMUNITY): Payer: Self-pay | Admitting: Orthopedic Surgery

## 2022-12-27 DIAGNOSIS — K219 Gastro-esophageal reflux disease without esophagitis: Secondary | ICD-10-CM | POA: Diagnosis not present

## 2022-12-27 DIAGNOSIS — J45909 Unspecified asthma, uncomplicated: Secondary | ICD-10-CM | POA: Diagnosis not present

## 2022-12-27 DIAGNOSIS — Z87891 Personal history of nicotine dependence: Secondary | ICD-10-CM | POA: Diagnosis not present

## 2022-12-27 DIAGNOSIS — Z9181 History of falling: Secondary | ICD-10-CM | POA: Diagnosis not present

## 2022-12-27 DIAGNOSIS — M1711 Unilateral primary osteoarthritis, right knee: Secondary | ICD-10-CM | POA: Diagnosis not present

## 2022-12-27 DIAGNOSIS — M1611 Unilateral primary osteoarthritis, right hip: Secondary | ICD-10-CM | POA: Diagnosis not present

## 2022-12-27 DIAGNOSIS — Z471 Aftercare following joint replacement surgery: Secondary | ICD-10-CM | POA: Diagnosis not present

## 2022-12-27 DIAGNOSIS — E785 Hyperlipidemia, unspecified: Secondary | ICD-10-CM | POA: Diagnosis not present

## 2022-12-27 DIAGNOSIS — Z7982 Long term (current) use of aspirin: Secondary | ICD-10-CM | POA: Diagnosis not present

## 2022-12-27 DIAGNOSIS — Z96652 Presence of left artificial knee joint: Secondary | ICD-10-CM | POA: Diagnosis not present

## 2022-12-27 DIAGNOSIS — I10 Essential (primary) hypertension: Secondary | ICD-10-CM | POA: Diagnosis not present

## 2022-12-27 DIAGNOSIS — I2510 Atherosclerotic heart disease of native coronary artery without angina pectoris: Secondary | ICD-10-CM | POA: Diagnosis not present

## 2022-12-27 DIAGNOSIS — Z952 Presence of prosthetic heart valve: Secondary | ICD-10-CM | POA: Diagnosis not present

## 2022-12-27 NOTE — OR Nursing (Signed)
Addendum created 12-27-2022 at 1249. Implants corrected and updated in chart.

## 2022-12-30 ENCOUNTER — Other Ambulatory Visit: Payer: Self-pay | Admitting: Internal Medicine

## 2022-12-31 DIAGNOSIS — M1711 Unilateral primary osteoarthritis, right knee: Secondary | ICD-10-CM | POA: Diagnosis not present

## 2022-12-31 DIAGNOSIS — Z9181 History of falling: Secondary | ICD-10-CM | POA: Diagnosis not present

## 2022-12-31 DIAGNOSIS — Z7982 Long term (current) use of aspirin: Secondary | ICD-10-CM | POA: Diagnosis not present

## 2022-12-31 DIAGNOSIS — I10 Essential (primary) hypertension: Secondary | ICD-10-CM | POA: Diagnosis not present

## 2022-12-31 DIAGNOSIS — Z96652 Presence of left artificial knee joint: Secondary | ICD-10-CM | POA: Diagnosis not present

## 2022-12-31 DIAGNOSIS — Z87891 Personal history of nicotine dependence: Secondary | ICD-10-CM | POA: Diagnosis not present

## 2022-12-31 DIAGNOSIS — Z952 Presence of prosthetic heart valve: Secondary | ICD-10-CM | POA: Diagnosis not present

## 2022-12-31 DIAGNOSIS — M1611 Unilateral primary osteoarthritis, right hip: Secondary | ICD-10-CM | POA: Diagnosis not present

## 2022-12-31 DIAGNOSIS — I2510 Atherosclerotic heart disease of native coronary artery without angina pectoris: Secondary | ICD-10-CM | POA: Diagnosis not present

## 2022-12-31 DIAGNOSIS — Z471 Aftercare following joint replacement surgery: Secondary | ICD-10-CM | POA: Diagnosis not present

## 2022-12-31 DIAGNOSIS — K219 Gastro-esophageal reflux disease without esophagitis: Secondary | ICD-10-CM | POA: Diagnosis not present

## 2022-12-31 DIAGNOSIS — J45909 Unspecified asthma, uncomplicated: Secondary | ICD-10-CM | POA: Diagnosis not present

## 2022-12-31 DIAGNOSIS — E785 Hyperlipidemia, unspecified: Secondary | ICD-10-CM | POA: Diagnosis not present

## 2023-01-01 MED ORDER — FUROSEMIDE 20 MG PO TABS: 40.0000 mg | ORAL_TABLET | Freq: Two times a day (BID) | ORAL | 9 refills | Status: DC

## 2023-01-01 NOTE — Therapy (Unsigned)
OUTPATIENT PHYSICAL THERAPY LOWER EXTREMITY EVALUATION   Patient Name: Frank [name] MRN: [id number] DOB:08-25-1951, 72 y.o., male Today's Date: 01/03/2023  END OF SESSION:  PT End of Session - 01/03/23 1401     Date for PT Re-Evaluation --             Past Medical History:  Diagnosis Date   Allergy    Asthma    Coronary artery disease    GERD (gastroesophageal reflux disease)    Headache(784.0)    Hypertension    Past Surgical History:  Procedure Laterality Date   [place], 2009   kidney stones  05/20/1995   KNEE SURGERY  02/26/1989   MITRAL VALVE REPAIR  01/02/2022   NASAL SINUS SURGERY  10/24/1992   RIGHT/LEFT HEART CATH AND CORONARY ANGIOGRAPHY N/A 11/15/2021   Procedure: RIGHT/LEFT HEART CATH AND CORONARY ANGIOGRAPHY;  Surgeon: [name], MD;  Location: [facility];  Service: Cardiovascular;  Laterality: N/A;   ROTATOR CUFF REPAIR Left 04/25/1993   TEE WITHOUT CARDIOVERSION N/A 08/24/2021   Procedure: TRANSESOPHAGEAL ECHOCARDIOGRAM (TEE);  Surgeon: Buford Dresser, MD;  Location: [facility];  Service: Cardiovascular;  Laterality: N/A;   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   Procedure: LEFT TOTAL KNEE ARTHROPLASTY;  Surgeon: Meredith Pel, MD;  Location: [facility];  Service: Orthopedics;  Laterality: Left;   Patient Active Problem List   Diagnosis Date Noted   Arthritis of knee 12/18/2022   Nonrheumatic mitral valve regurgitation s/p repair 2023    Peroneal tendinitis of right lower extremity 04/25/2021   Benign localized prostatic hyperplasia with lower urinary tract symptoms (LUTS) 09/27/2020   Wheezing 09/14/2020   Chondrocalcinosis 03/14/2020   Dyslipidemia 09/23/2017   Bilateral primary osteoarthritis of knee 09/02/2017   Neck pain 02/15/2017   Primary osteoarthritis of right hip 02/15/2017   Tear of right acetabular labrum 02/15/2017   Right leg pain 01/04/2017   Obese 08/17/2015    Elevated blood sugar 08/17/2015   Shingles 09/10/2014   Dyspnea 123XX123   HERNIA, UMBILICAL 123XX123   Essential hypertension 01/30/2008   Allergic rhinitis 01/30/2008   GERD 01/30/2008   HEADACHE 01/30/2008    PCP: [name], MD  REFERRING PROVIDER: Meredith Pel, MD  REFERRING DIAG: Bilateral primary osteoarthritis of knee [M17.0], S/P total knee arthroplasty, left [id number]   THERAPY DIAG:  Chronic pain of left knee - Plan: PT plan of care cert/re-cert  Pain in joint of left knee - Plan: PT plan of care cert/re-cert  Muscle weakness (generalized) - Plan: PT plan of care cert/re-cert  Difficulty walking - Plan: PT plan of care cert/re-cert  Localized edema - Plan: PT plan of care cert/re-cert  Rationale for Evaluation and Treatment: Rehabilitation  ONSET DATE: 12/18/2022  SUBJECTIVE:   SUBJECTIVE STATEMENT: Patient is doing well since right TKA on 12/18/22. He is currently using a 4WW with minimal pain with use of pain meds. He reports sleeping well, with minimal complaints. He reports some stiffness and swelling. He has been actively been using the CPM machine.   PERTINENT HISTORY: Asthma, GERD, HTN PAIN:  Are you having pain? Yes: NPRS scale: 1/10 Pain location: L knee  Pain description: Achy pain at surgical site Aggravating factors: Walking,  Relieving factors: Rest, ice  PRECAUTIONS: Knee  WEIGHT BEARING RESTRICTIONS: No  FALLS:  Has patient fallen in last 6 months? No  LIVING ENVIRONMENT: Lives with: lives with their spouse Lives in: House/apartment  Stairs: Yes: External: 1-3 steps; bilateral but cannot reach both Has following equipment at home: Single point cane, Walker - 4 wheeled, and shower chair  OCCUPATION: Retired  Independent PLOF: [place]: Get back to functional mobility and prep for R TKA    OBJECTIVE:   DIAGNOSTIC FINDINGS:   1. Tricompartmental osteoarthritis of the left knee, severe  within the medial compartment. 2. Complex tearing/maceration of the medial meniscus. 3. Intrasubstance degeneration of the lateral meniscal posterior horn with presumed degenerative tearing. 4. Severe mucoid degeneration of the posterior cruciate ligament. ACL is not well seen and may be chronically torn or severely degenerated. 5. Large joint effusion containing numerous intra-articular loose bodies. 6. Small Baker's cyst.  PATIENT SURVEYS:  FOTO 50.16%, 64% in 12 visits.   COGNITION: Overall cognitive status: Within functional limits for tasks assessed     SENSATION: WFL  PALPATION: Tenderness around surgical site.   LOWER EXTREMITY ROM:  Active ROM Right eval Left eval  Knee flexion 113 90  Knee extension 0 -4   (Blank rows = not tested)  LOWER EXTREMITY MMT:  MMT Right eval Left eval  Hip flexion 4 4  Knee flexion 4+ 4  Knee extension 4 4   (Blank rows = not tested)   FUNCTIONAL TESTS:  5 times sit to stand: 20.91 sec  Pt is able to ascend/ descend stairs with step too gait pattern and use of unilateral railing.   GAIT: Distance walked: 82f  Assistive device utilized: WEnvironmental consultant- 4 wheeled Level of assistance: Complete Independence Comments: Pt walks with slow gait with limited stance time on L LE.    TODAY'S TREATMENT:                                                                                                                              DATE: Creating, reviewing, and completing below HEP   Performed 10 reps of each of HEP exercises to ensure form was correct with no adverse effects.    PATIENT EDUCATION:  Education details: Educated pt on anatomy and physiology of current symptoms, FOTO, diagnosis, prognosis, HEP,  and POC. Person educated: Patient and Spouse Education method: Explanation Education comprehension: verbalized understanding and returned demonstration  HOME EXERCISE PROGRAM: Access Code: [email or web address]:  [email or web address] Date: 01/03/2023 Prepared by: SRudi Heap Exercises - Seated Knee Flexion AAROM  - 2 x daily - 7 x weekly - 2 sets - 10 reps - 2-5 hold - Seated Long Arc Quad  - 2 x daily - 7 x weekly - 2 sets - 10 reps - Sit to Stand  - 2 x daily - 7 x weekly - 2 sets - 10 reps - Long Sitting Quad Set  - 1 x daily - 7 x weekly - 3 sets - 10 reps - Supine Heel Slide with Strap  - 2 x daily - 7 x weekly - 2 sets - 10 reps - 2-5 hold  ASSESSMENT:  CLINICAL IMPRESSION: Patient referred to PT for s/p left knee replacement on 12/18/22. He is doing quite well following his surgery, he lack some ROM of the left knee and overall LE strength. Patient will benefit from skilled PT to address these impairments and improve overall function.   OBJECTIVE IMPAIRMENTS: decreased activity tolerance, difficulty walking, decreased balance, decreased endurance, decreased mobility, decreased ROM, decreased strength, impaired flexibility, impaired UE/LE use, postural dysfunction, and pain.  ACTIVITY LIMITATIONS: bending, lifting, carry, locomotion, cleaning, community activity, driving, and or occupation  PERSONAL FACTORS: Asthma, GERD, HTN are also affecting patient's functional outcome.  REHAB POTENTIAL: Good  CLINICAL DECISION MAKING: Stable/uncomplicated  EVALUATION COMPLEXITY: Low    GOALS: Short term PT Goals Target date: 01/03/2023     Pt will be I and compliant with HEP. Baseline:  Goal status: New Pt will decrease pain by 25% overall Baseline: Goal status: New   Long term PT goals Target date:02/28/2023   Pt will improve PROM to 0-120 to improve functional mobility Baseline: Goal status: New Pt will improve  hip/knee strength to at least 4+/5 MMT to improve functional strength Baseline: Goal status: New Pt will improve FOTO to at least 64% functional to show improved function Baseline: Goal status: New Pt will reduce pain to overall less than 2-3/10 with usual  activity and work activity without use of narcotics. Baseline: Goal status: New Pt will be able to ambulate community distances at least 200 ft with mild complaints without AD Baseline: Goal status: New   PLAN: PT FREQUENCY: 1-3 times per week    PT DURATION: 8-12 weeks   PLANNED INTERVENTIONS (unless contraindicated): aquatic PT, Canalith repositioning, cryotherapy, Electrical stimulation, Iontophoresis with 4 mg/ml dexamethasome, Moist heat, traction, Ultrasound, gait training, Therapeutic exercise, balance training, neuromuscular re-education, patient/family education, prosthetic training, manual techniques, passive ROM, dry needling, taping, vasopnuematic device, vestibular, spinal manipulations, joint manipulations   PLAN FOR NEXT SESSION: review/update HEP PRN. Knee flexion ROM and quad strengthening ( Vaso at end for pain and swelling).     Lynden Ang, PT 01/03/2023, 2:47 PM

## 2023-01-02 ENCOUNTER — Ambulatory Visit (INDEPENDENT_AMBULATORY_CARE_PROVIDER_SITE_OTHER): Payer: Medicare Other | Admitting: Surgical

## 2023-01-02 ENCOUNTER — Telehealth: Payer: Self-pay | Admitting: *Deleted

## 2023-01-02 ENCOUNTER — Ambulatory Visit (INDEPENDENT_AMBULATORY_CARE_PROVIDER_SITE_OTHER): Payer: Medicare Other

## 2023-01-02 DIAGNOSIS — Z96652 Presence of left artificial knee joint: Secondary | ICD-10-CM

## 2023-01-02 DIAGNOSIS — I10 Essential (primary) hypertension: Secondary | ICD-10-CM | POA: Diagnosis not present

## 2023-01-02 DIAGNOSIS — M1711 Unilateral primary osteoarthritis, right knee: Secondary | ICD-10-CM | POA: Diagnosis not present

## 2023-01-02 DIAGNOSIS — K219 Gastro-esophageal reflux disease without esophagitis: Secondary | ICD-10-CM | POA: Diagnosis not present

## 2023-01-02 DIAGNOSIS — Z952 Presence of prosthetic heart valve: Secondary | ICD-10-CM | POA: Diagnosis not present

## 2023-01-02 DIAGNOSIS — J45909 Unspecified asthma, uncomplicated: Secondary | ICD-10-CM | POA: Diagnosis not present

## 2023-01-02 DIAGNOSIS — Z471 Aftercare following joint replacement surgery: Secondary | ICD-10-CM | POA: Diagnosis not present

## 2023-01-02 DIAGNOSIS — E785 Hyperlipidemia, unspecified: Secondary | ICD-10-CM | POA: Diagnosis not present

## 2023-01-02 DIAGNOSIS — I2510 Atherosclerotic heart disease of native coronary artery without angina pectoris: Secondary | ICD-10-CM | POA: Diagnosis not present

## 2023-01-02 DIAGNOSIS — Z7982 Long term (current) use of aspirin: Secondary | ICD-10-CM | POA: Diagnosis not present

## 2023-01-02 DIAGNOSIS — Z9181 History of falling: Secondary | ICD-10-CM | POA: Diagnosis not present

## 2023-01-02 DIAGNOSIS — M1611 Unilateral primary osteoarthritis, right hip: Secondary | ICD-10-CM | POA: Diagnosis not present

## 2023-01-02 DIAGNOSIS — Z87891 Personal history of nicotine dependence: Secondary | ICD-10-CM | POA: Diagnosis not present

## 2023-01-02 NOTE — Telephone Encounter (Signed)
Met with patient during his post op appointment. Ortho bundle 14 day in office meeting completed.

## 2023-01-03 ENCOUNTER — Ambulatory Visit: Payer: Medicare Other | Admitting: Physical Therapy

## 2023-01-03 ENCOUNTER — Other Ambulatory Visit: Payer: Self-pay | Admitting: Surgical

## 2023-01-03 ENCOUNTER — Encounter: Payer: Self-pay | Admitting: Physical Therapy

## 2023-01-03 ENCOUNTER — Other Ambulatory Visit: Payer: Self-pay | Admitting: Family Medicine

## 2023-01-03 ENCOUNTER — Other Ambulatory Visit: Payer: Self-pay

## 2023-01-03 ENCOUNTER — Telehealth: Payer: Self-pay | Admitting: *Deleted

## 2023-01-03 DIAGNOSIS — M6281 Muscle weakness (generalized): Secondary | ICD-10-CM

## 2023-01-03 DIAGNOSIS — G8929 Other chronic pain: Secondary | ICD-10-CM

## 2023-01-03 DIAGNOSIS — R262 Difficulty in walking, not elsewhere classified: Secondary | ICD-10-CM

## 2023-01-03 DIAGNOSIS — R600 Localized edema: Secondary | ICD-10-CM

## 2023-01-03 DIAGNOSIS — M25562 Pain in left knee: Secondary | ICD-10-CM | POA: Diagnosis not present

## 2023-01-03 MED ORDER — HYDROCODONE-ACETAMINOPHEN 5-325 MG PO TABS: 1.0000 | ORAL_TABLET | Freq: Four times a day (QID) | ORAL | 0 refills | Status: DC | PRN

## 2023-01-03 MED ORDER — BACLOFEN 10 MG PO TABS: 10.0000 mg | ORAL_TABLET | Freq: Three times a day (TID) | ORAL | 1 refills | Status: DC | PRN

## 2023-01-03 NOTE — Telephone Encounter (Signed)
Patient called and mentioned you were going to send in a different muscle spasm medication and pain medication. I don't see it. [facility]. Thanks.

## 2023-01-03 NOTE — Telephone Encounter (Signed)
Sent in

## 2023-01-04 ENCOUNTER — Encounter: Payer: Self-pay | Admitting: Surgical

## 2023-01-05 ENCOUNTER — Encounter: Payer: Self-pay | Admitting: Surgical

## 2023-01-05 NOTE — Progress Notes (Signed)
Post-Op Visit Note   [name]           Date of Birth: July 31, 1951           MRN: [id number] Visit Date: 01/02/2023 PCP: [name], MD   Assessment & Plan:  Chief Complaint:  Chief Complaint  Patient presents with   Left Knee - Routine Post Op    12/18/22 (2w 1d)Left Total Knee Arthroplasty      Visit Diagnoses:  1. S/P total knee arthroplasty, left     Plan: Frank [name] is a 72 y.o. male who presents s/p left total knee arthroplasty on 12/18/2022.  Doing well overall.  Using CPM machine.  They deny any calf pain, shortness of breath, chest pain, abdominal pain.  Pain is overall controlled and taking oxycodone and muscle relaxer for pain control.  Taking aspirin for DVT prophylaxis.  Ambulating with walker but feels ready to transition to cane.  Having a lot of muscle spasms in his quad but he denies any muscle spasms in any other muscles or any numbness/tingling that is new aside from some mild numbness in his toes.  Not having any perioral numbness..   On exam, patient has range of motion 2 degrees extension to 90 degrees of knee flexion.  Incision is healing well without evidence of infection or dehiscence.  2+ DP pulse of the operative extremity.  No calf tenderness, negative Homans' sign.  Able to perform straight leg raise.  Intact ankle dorsiflexion.  Plan is to try different pain medicine and muscle laxer to see if this will help with the muscle spasms he is experiencing in his quad region.  Sent in prescription for Norco and baclofen.  Continue with aspirin for DVT prophylaxis.  Start outpatient physical therapy which she starts tomorrow.  He will work with them on transitioning from walker to a cane.  Follow-up in 4 weeks for clinical recheck with Dr. Marlou Sa..    Follow-Up Instructions: No follow-ups on file.   Orders:  Orders Placed This Encounter  Procedures   XR Knee 1-2 Views Left   No orders of the defined types were placed in this  encounter.   Imaging: No results found.  PMFS History: Patient Active Problem List   Diagnosis Date Noted   Arthritis of knee 12/18/2022   Nonrheumatic mitral valve regurgitation s/p repair 2023    Peroneal tendinitis of right lower extremity 04/25/2021   Benign localized prostatic hyperplasia with lower urinary tract symptoms (LUTS) 09/27/2020   Wheezing 09/14/2020   Chondrocalcinosis 03/14/2020   Dyslipidemia 09/23/2017   Bilateral primary osteoarthritis of knee 09/02/2017   Neck pain 02/15/2017   Primary osteoarthritis of right hip 02/15/2017   Tear of right acetabular labrum 02/15/2017   Right leg pain 01/04/2017   Obese 08/17/2015   Elevated blood sugar 08/17/2015   Shingles 09/10/2014   Dyspnea 123XX123   HERNIA, UMBILICAL 123XX123   Essential hypertension 01/30/2008   Allergic rhinitis 01/30/2008   GERD 01/30/2008   HEADACHE 01/30/2008   Past Medical History:  Diagnosis Date   Allergy    Asthma    Coronary artery disease    GERD (gastroesophageal reflux disease)    Headache(784.0)    Hypertension     Family History  Problem Relation Age of Onset   Kidney disease Mother    COPD Father    Pneumonia Father    Cardiomyopathy Brother    Hypertension Sister    Heart disease Brother  Hypertension Brother    Hypertension Brother    Hypertension Sister    Diabetes Sister    Osteoarthritis Sister     Past Surgical History:  Procedure Laterality Date   [place], 2009   kidney stones  05/20/1995   KNEE SURGERY  02/26/1989   MITRAL VALVE REPAIR  01/02/2022   NASAL SINUS SURGERY  10/24/1992   RIGHT/LEFT HEART CATH AND CORONARY ANGIOGRAPHY N/A 11/15/2021   Procedure: RIGHT/LEFT HEART CATH AND CORONARY ANGIOGRAPHY;  Surgeon: [name], MD;  Location: [place] [facility];  Service: Cardiovascular;  Laterality: N/A;   ROTATOR CUFF REPAIR Left 04/25/1993   TEE WITHOUT CARDIOVERSION N/A 08/24/2021   Procedure:  TRANSESOPHAGEAL ECHOCARDIOGRAM (TEE);  Surgeon: Buford Dresser, MD;  Location: [facility];  Service: Cardiovascular;  Laterality: N/A;   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   Procedure: LEFT TOTAL KNEE ARTHROPLASTY;  Surgeon: Meredith Pel, MD;  Location: [place];  Service: Orthopedics;  Laterality: Left;   Social History   Occupational History   Not on file  Tobacco Use   Smoking status: Former   Smokeless tobacco: Never   Tobacco comments:    quit in 1972  Vaping Use   Vaping Use: Never used  Substance and Sexual Activity   Alcohol use: No   Drug use: No   Sexual activity: Not on file

## 2023-01-08 ENCOUNTER — Encounter: Payer: Self-pay | Admitting: Physical Therapy

## 2023-01-08 ENCOUNTER — Ambulatory Visit: Payer: Medicare Other | Admitting: Physical Therapy

## 2023-01-08 DIAGNOSIS — R600 Localized edema: Secondary | ICD-10-CM | POA: Diagnosis not present

## 2023-01-08 DIAGNOSIS — G8929 Other chronic pain: Secondary | ICD-10-CM

## 2023-01-08 DIAGNOSIS — R262 Difficulty in walking, not elsewhere classified: Secondary | ICD-10-CM | POA: Diagnosis not present

## 2023-01-08 DIAGNOSIS — M6281 Muscle weakness (generalized): Secondary | ICD-10-CM

## 2023-01-08 DIAGNOSIS — M25562 Pain in left knee: Secondary | ICD-10-CM

## 2023-01-08 NOTE — Therapy (Signed)
OUTPATIENT PHYSICAL THERAPY LOWER EXTREMITY TREATMENT   Patient Name: Frank [name] MRN: [id number] DOB:[birth year], 72 y.o., male Today's Date: 01/08/2023  END OF SESSION:  PT End of Session - 01/08/23 0857     Visit Number 2    Number of Visits 12    Date for PT Re-Evaluation 02/28/23    Authorization Type UHC MEDICARE    Progress Note Due on Visit 10    PT Start Time 0848    PT Stop Time 0930    PT Time Calculation (min) 42 min    Activity Tolerance Patient tolerated treatment well    Behavior During Therapy [facility] for tasks assessed/performed              Past Medical History:  Diagnosis Date   Allergy    Asthma    Coronary artery disease    GERD (gastroesophageal reflux disease)    Headache(784.0)    Hypertension    Past Surgical History:  Procedure Laterality Date   [place], 2009   kidney stones  05/20/1995   KNEE SURGERY  02/26/1989   MITRAL VALVE REPAIR  01/02/2022   NASAL SINUS SURGERY  10/24/1992   RIGHT/LEFT HEART CATH AND CORONARY ANGIOGRAPHY N/A 11/15/2021   Procedure: RIGHT/LEFT HEART CATH AND CORONARY ANGIOGRAPHY;  Surgeon: [name], MD;  Location: [place] [facility];  Service: Cardiovascular;  Laterality: N/A;   ROTATOR CUFF REPAIR Left 04/25/1993   TEE WITHOUT CARDIOVERSION N/A 08/24/2021   Procedure: TRANSESOPHAGEAL ECHOCARDIOGRAM (TEE);  Surgeon: Buford Dresser, MD;  Location: [facility];  Service: Cardiovascular;  Laterality: N/A;   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   Procedure: LEFT TOTAL KNEE ARTHROPLASTY;  Surgeon: Meredith Pel, MD;  Location: [place];  Service: Orthopedics;  Laterality: Left;   Patient Active Problem List   Diagnosis Date Noted   Arthritis of knee 12/18/2022   Nonrheumatic mitral valve regurgitation s/p repair 2023    Peroneal tendinitis of right lower extremity 04/25/2021   Benign localized prostatic hyperplasia with lower urinary tract symptoms (LUTS)  09/27/2020   Wheezing 09/14/2020   Chondrocalcinosis 03/14/2020   Dyslipidemia 09/23/2017   Bilateral primary osteoarthritis of knee 09/02/2017   Neck pain 02/15/2017   Primary osteoarthritis of right hip 02/15/2017   Tear of right acetabular labrum 02/15/2017   Right leg pain 01/04/2017   Obese 08/17/2015   Elevated blood sugar 08/17/2015   Shingles 09/10/2014   Dyspnea 123XX123   HERNIA, UMBILICAL 123XX123   Essential hypertension 01/30/2008   Allergic rhinitis 01/30/2008   GERD 01/30/2008   HEADACHE 01/30/2008    PCP: [name], MD  REFERRING PROVIDER: Meredith Pel, MD  REFERRING DIAG: Bilateral primary osteoarthritis of knee [M17.0], S/P total knee arthroplasty, left [id number]   THERAPY DIAG:  Chronic pain of left knee  Pain in joint of left knee  Muscle weakness (generalized)  Difficulty walking  Localized edema  Rationale for Evaluation and Treatment: Rehabilitation  ONSET DATE: 12/18/2022  SUBJECTIVE:   SUBJECTIVE STATEMENT:  Knee is feeling good this morning, the hardest thing for me to do is bend the knee. No falls/no close calls.    PERTINENT HISTORY: Asthma, GERD, HTN PAIN:  Are you having pain? Yes: NPRS scale: 1/10 Pain location: L knee  Pain description: Achy pain at surgical site Aggravating factors: Walking,  Relieving factors: Rest, ice  PRECAUTIONS: Knee  WEIGHT BEARING RESTRICTIONS: No  FALLS:  Has patient fallen in last  6 months? No  LIVING ENVIRONMENT: Lives with: lives with their spouse Lives in: House/apartment Stairs: Yes: External: 1-3 steps; bilateral but cannot reach both Has following equipment at home: Single point cane, Walker - 4 wheeled, and shower chair  OCCUPATION: Retired  Independent PLOF: [place]: Get back to functional mobility and prep for R TKA    OBJECTIVE:   DIAGNOSTIC FINDINGS:   1. Tricompartmental osteoarthritis of the left knee, severe within the medial  compartment. 2. Complex tearing/maceration of the medial meniscus. 3. Intrasubstance degeneration of the lateral meniscal posterior horn with presumed degenerative tearing. 4. Severe mucoid degeneration of the posterior cruciate ligament. ACL is not well seen and may be chronically torn or severely degenerated. 5. Large joint effusion containing numerous intra-articular loose bodies. 6. Small Baker's cyst.  PATIENT SURVEYS:  FOTO 50.16%, 64% in 12 visits.   COGNITION: Overall cognitive status: Within functional limits for tasks assessed     SENSATION: WFL  PALPATION: Tenderness around surgical site.   LOWER EXTREMITY ROM:  Active ROM Right eval Left eval  Knee flexion 113 90  Knee extension 0 -4   (Blank rows = not tested)  LOWER EXTREMITY MMT:  MMT Right eval Left eval  Hip flexion 4 4  Knee flexion 4+ 4  Knee extension 4 4   (Blank rows = not tested)   FUNCTIONAL TESTS:  5 times sit to stand: 20.91 sec  Pt is able to ascend/ descend stairs with step too gait pattern and use of unilateral railing.   GAIT: Distance walked: 69f  Assistive device utilized: WEnvironmental consultant- 4 wheeled Level of assistance: Complete Independence Comments: Pt walks with slow gait with limited stance time on L LE.    TODAY'S TREATMENT:                                                                                                                              DATE:   01/08/23  TherEx  Scifit bike 8 progressing to seat 6,  full rotations for ROM x6 minutes  SAQ 3# x15 with 5 second holds SLR + quad set 1x4, then 1x8 0# still with quad lag  Bridges 1x10  LAQs 3# 5 second holds x10 Gait training heel-toe pattern in clinic distances     Manual  Patellar mobs grade III L LE quad MET + flexion AAROM     EVAL  Creating, reviewing, and completing below HEP   Performed 10 reps of each of HEP exercises to ensure form was correct with no adverse effects.    PATIENT EDUCATION:   Education details: Educated pt on anatomy and physiology of current symptoms, FOTO, diagnosis, prognosis, HEP,  and POC. Person educated: Patient and Spouse Education method: Explanation Education comprehension: verbalized understanding and returned demonstration  HOME EXERCISE PROGRAM: Access Code: [email or web address]: [email or web address] Date: 01/03/2023 Prepared by: SRudi Heap Exercises - Seated Knee Flexion AAROM  - 2 x daily - 7  x weekly - 2 sets - 10 reps - 2-5 hold - Seated Long Arc Quad  - 2 x daily - 7 x weekly - 2 sets - 10 reps - Sit to Stand  - 2 x daily - 7 x weekly - 2 sets - 10 reps - Long Sitting Quad Set  - 1 x daily - 7 x weekly - 3 sets - 10 reps - Supine Heel Slide with Strap  - 2 x daily - 7 x weekly - 2 sets - 10 reps - 2-5 hold   ASSESSMENT:  CLINICAL IMPRESSION:   Charvis arrives today doing well, knee is still feeling good. We worked on ROM and functional strength as appropriate and tolerated today. Pain still remaining at low levels. Quad seems quite weak still post-op. Of note had severe muscle "spasms" when activating quad for SLRs, these occur even at rest- will continue to monitor, do not seem like typical post-op mm spasms to me at this time, seemed to respond well to deep pressure over mm belly.  Tells me he has ad a "stone bruise" between first and second MTPs, nothing seen on skin check we will continue to monitor. Seems to be making good progress so far, we will continue to progress all functional activities as able.    OBJECTIVE IMPAIRMENTS: decreased activity tolerance, difficulty walking, decreased balance, decreased endurance, decreased mobility, decreased ROM, decreased strength, impaired flexibility, impaired UE/LE use, postural dysfunction, and pain.  ACTIVITY LIMITATIONS: bending, lifting, carry, locomotion, cleaning, community activity, driving, and or occupation  PERSONAL FACTORS: Asthma, GERD, HTN are also affecting patient's  functional outcome.  REHAB POTENTIAL: Good  CLINICAL DECISION MAKING: Stable/uncomplicated  EVALUATION COMPLEXITY: Low    GOALS: Short term PT Goals Target date: 01/03/2023     Pt will be I and compliant with HEP. Baseline:  Goal status: New Pt will decrease pain by 25% overall Baseline: Goal status: New   Long term PT goals Target date:02/28/2023   Pt will improve PROM to 0-120 to improve functional mobility Baseline: Goal status: New Pt will improve  hip/knee strength to at least 4+/5 MMT to improve functional strength Baseline: Goal status: New Pt will improve FOTO to at least 64% functional to show improved function Baseline: Goal status: New Pt will reduce pain to overall less than 2-3/10 with usual activity and work activity without use of narcotics. Baseline: Goal status: New Pt will be able to ambulate community distances at least 200 ft with mild complaints without AD Baseline: Goal status: New   PLAN: PT FREQUENCY: 1-3 times per week    PT DURATION: 8-12 weeks   PLANNED INTERVENTIONS (unless contraindicated): aquatic PT, Canalith repositioning, cryotherapy, Electrical stimulation, Iontophoresis with 4 mg/ml dexamethasome, Moist heat, traction, Ultrasound, gait training, Therapeutic exercise, balance training, neuromuscular re-education, patient/family education, prosthetic training, manual techniques, passive ROM, dry needling, taping, vasopnuematic device, vestibular, spinal manipulations, joint manipulations   PLAN FOR NEXT SESSION: review/update HEP PRN. Knee flexion ROM and quad strengthening ( Vaso at end for pain and swelling).    Deniece Ree PT DPT PN2

## 2023-01-09 ENCOUNTER — Other Ambulatory Visit: Payer: Self-pay | Admitting: Surgical

## 2023-01-09 ENCOUNTER — Telehealth: Payer: Self-pay | Admitting: *Deleted

## 2023-01-09 MED ORDER — HYDROCODONE-ACETAMINOPHEN 5-325 MG PO TABS: 1.0000 | ORAL_TABLET | Freq: Four times a day (QID) | ORAL | 0 refills | Status: DC | PRN

## 2023-01-09 NOTE — Telephone Encounter (Signed)
Sent in refill

## 2023-01-09 NOTE — Telephone Encounter (Signed)
Patient called requesting refill of pain medication. Doing well with OPPT. Pharmacy on [address]. Thanks.

## 2023-01-09 NOTE — Telephone Encounter (Signed)
Patient aware.

## 2023-01-11 ENCOUNTER — Ambulatory Visit: Payer: Medicare Other | Admitting: Physical Therapy

## 2023-01-11 ENCOUNTER — Encounter: Payer: Self-pay | Admitting: Physical Therapy

## 2023-01-11 DIAGNOSIS — R600 Localized edema: Secondary | ICD-10-CM

## 2023-01-11 DIAGNOSIS — M6281 Muscle weakness (generalized): Secondary | ICD-10-CM | POA: Diagnosis not present

## 2023-01-11 DIAGNOSIS — R262 Difficulty in walking, not elsewhere classified: Secondary | ICD-10-CM

## 2023-01-11 DIAGNOSIS — G8929 Other chronic pain: Secondary | ICD-10-CM | POA: Diagnosis not present

## 2023-01-11 DIAGNOSIS — M25562 Pain in left knee: Secondary | ICD-10-CM | POA: Diagnosis not present

## 2023-01-11 NOTE — Therapy (Signed)
OUTPATIENT PHYSICAL THERAPY LOWER EXTREMITY TREATMENT   Patient Name: Frank [name] MRN: [id number] DOB:03/08/1951, 72 y.o., male Today's Date: 01/11/2023  END OF SESSION:  PT End of Session - 01/11/23 1330     Visit Number 3    Number of Visits 12    Date for PT Re-Evaluation 02/28/23    Authorization Type [employer] MEDICARE    Progress Note Due on Visit 10    PT Start Time 1302    PT Stop Time 1343    PT Time Calculation (min) 41 min    Activity Tolerance Patient tolerated treatment well    Behavior During Therapy [facility] for tasks assessed/performed               Past Medical History:  Diagnosis Date   Allergy    Asthma    Coronary artery disease    GERD (gastroesophageal reflux disease)    Headache(784.0)    Hypertension    Past Surgical History:  Procedure Laterality Date   [place], 2009   kidney stones  05/20/1995   KNEE SURGERY  02/26/1989   MITRAL VALVE REPAIR  01/02/2022   NASAL SINUS SURGERY  10/24/1992   RIGHT/LEFT HEART CATH AND CORONARY ANGIOGRAPHY N/A 11/15/2021   Procedure: RIGHT/LEFT HEART CATH AND CORONARY ANGIOGRAPHY;  Surgeon: [name], MD;  Location: [place] [facility];  Service: Cardiovascular;  Laterality: N/A;   ROTATOR CUFF REPAIR Left 04/25/1993   TEE WITHOUT CARDIOVERSION N/A 08/24/2021   Procedure: TRANSESOPHAGEAL ECHOCARDIOGRAM (TEE);  Surgeon: Buford Dresser, MD;  Location: [facility];  Service: Cardiovascular;  Laterality: N/A;   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   Procedure: LEFT TOTAL KNEE ARTHROPLASTY;  Surgeon: Meredith Pel, MD;  Location: [place];  Service: Orthopedics;  Laterality: Left;   Patient Active Problem List   Diagnosis Date Noted   Arthritis of knee 12/18/2022   Nonrheumatic mitral valve regurgitation s/p repair 2023    Peroneal tendinitis of right lower extremity 04/25/2021   Benign localized prostatic hyperplasia with lower urinary tract symptoms  (LUTS) 09/27/2020   Wheezing 09/14/2020   Chondrocalcinosis 03/14/2020   Dyslipidemia 09/23/2017   Bilateral primary osteoarthritis of knee 09/02/2017   Neck pain 02/15/2017   Primary osteoarthritis of right hip 02/15/2017   Tear of right acetabular labrum 02/15/2017   Right leg pain 01/04/2017   Obese 08/17/2015   Elevated blood sugar 08/17/2015   Shingles 09/10/2014   Dyspnea 123XX123   HERNIA, UMBILICAL 123XX123   Essential hypertension 01/30/2008   Allergic rhinitis 01/30/2008   GERD 01/30/2008   HEADACHE 01/30/2008    PCP: [name], MD  REFERRING PROVIDER: Meredith Pel, MD  REFERRING DIAG: Bilateral primary osteoarthritis of knee [M17.0], S/P total knee arthroplasty, left [id number]   THERAPY DIAG:  Chronic pain of left knee  Pain in joint of left knee  Muscle weakness (generalized)  Difficulty walking  Localized edema  Rationale for Evaluation and Treatment: Rehabilitation  ONSET DATE: 12/18/2022  SUBJECTIVE:   SUBJECTIVE STATEMENT:  My right knee hurts today not sure why, left knee actually feels better than the right. Nothing else new going on.    PERTINENT HISTORY: Asthma, GERD, HTN PAIN:  Are you having pain? Yes: NPRS scale: 2/10 Pain location: L knee  Pain description: Achy pain at surgical site Aggravating factors: Walking,  Relieving factors: Rest, ice  PRECAUTIONS: Knee  WEIGHT BEARING RESTRICTIONS: No  FALLS:  Has patient fallen in  last 6 months? No  LIVING ENVIRONMENT: Lives with: lives with their spouse Lives in: House/apartment Stairs: Yes: External: 1-3 steps; bilateral but cannot reach both Has following equipment at home: Single point cane, Walker - 4 wheeled, and shower chair  OCCUPATION: Retired  Independent PLOF: [place]: Get back to functional mobility and prep for R TKA    OBJECTIVE:   DIAGNOSTIC FINDINGS:   1. Tricompartmental osteoarthritis of the left knee, severe  within the medial compartment. 2. Complex tearing/maceration of the medial meniscus. 3. Intrasubstance degeneration of the lateral meniscal posterior horn with presumed degenerative tearing. 4. Severe mucoid degeneration of the posterior cruciate ligament. ACL is not well seen and may be chronically torn or severely degenerated. 5. Large joint effusion containing numerous intra-articular loose bodies. 6. Small Baker's cyst.  PATIENT SURVEYS:  FOTO 50.16%, 64% in 12 visits.   COGNITION: Overall cognitive status: Within functional limits for tasks assessed     SENSATION: WFL  PALPATION: Tenderness around surgical site.   LOWER EXTREMITY ROM:  Active ROM Right eval Left eval Left 01/11/23  Knee flexion 113 90 AAROM 103*, 99* AROM   Knee extension 0 -4    (Blank rows = not tested)  LOWER EXTREMITY MMT:  MMT Right eval Left eval  Hip flexion 4 4  Knee flexion 4+ 4  Knee extension 4 4   (Blank rows = not tested)   FUNCTIONAL TESTS:  5 times sit to stand: 20.91 sec  Pt is able to ascend/ descend stairs with step too gait pattern and use of unilateral railing.   GAIT: Distance walked: 55ft  Assistive device utilized: Environmental consultant - 4 wheeled Level of assistance: Complete Independence Comments: Pt walks with slow gait with limited stance time on L LE.    TODAY'S TREATMENT:                                                                                                                              DATE:   01/11/23  TherEx  Scifit bike seat 7 x6 minutes (full rotations) SAQs 3# x15 with 3 second holds SLR + quad x15 0# LAQs 3# x15 with 3 second holds    Manual  Patella mobs all directions  Tib on femur joint mobs for improved flexion x3 rounds Quad MET + flexion AAROM x10  01/08/23  TherEx  Scifit bike 8 progressing to seat 6,  full rotations for ROM x6 minutes  SAQ 3# x15 with 5 second holds SLR + quad set 1x4, then 1x8 0# still with quad lag  Bridges  1x10  LAQs 3# 5 second holds x10 Gait training heel-toe pattern in clinic distances     Manual  Patellar mobs grade III L LE quad MET + flexion AAROM     EVAL  Creating, reviewing, and completing below HEP   Performed 10 reps of each of HEP exercises to ensure form was correct with no adverse effects.  PATIENT EDUCATION:  Education details: Educated pt on anatomy and physiology of current symptoms, FOTO, diagnosis, prognosis, HEP,  and POC. Person educated: Patient and Spouse Education method: Explanation Education comprehension: verbalized understanding and returned demonstration  HOME EXERCISE PROGRAM: Access Code: [email or web address] URL: [email or web address] Date: 01/03/2023 Prepared by: Rudi Heap  Exercises - Seated Knee Flexion AAROM  - 2 x daily - 7 x weekly - 2 sets - 10 reps - 2-5 hold - Seated Long Arc Quad  - 2 x daily - 7 x weekly - 2 sets - 10 reps - Sit to Stand  - 2 x daily - 7 x weekly - 2 sets - 10 reps - Long Sitting Quad Set  - 1 x daily - 7 x weekly - 3 sets - 10 reps - Supine Heel Slide with Strap  - 2 x daily - 7 x weekly - 2 sets - 10 reps - 2-5 hold   ASSESSMENT:  CLINICAL IMPRESSION:   Dona arrives today doing well, right knee is actually more painful that left (maybe due to the rain?). Continued working on quad strength and knee ROM as tolerated today. Doing well and seems to be progressing. Noted improved severity of quad spams today as well.   OBJECTIVE IMPAIRMENTS: decreased activity tolerance, difficulty walking, decreased balance, decreased endurance, decreased mobility, decreased ROM, decreased strength, impaired flexibility, impaired UE/LE use, postural dysfunction, and pain.  ACTIVITY LIMITATIONS: bending, lifting, carry, locomotion, cleaning, community activity, driving, and or occupation  PERSONAL FACTORS: Asthma, GERD, HTN are also affecting patient's functional outcome.  REHAB POTENTIAL: Good  CLINICAL DECISION  MAKING: Stable/uncomplicated  EVALUATION COMPLEXITY: Low    GOALS: Short term PT Goals Target date: 01/03/2023     Pt will be I and compliant with HEP. Baseline:  Goal status: New Pt will decrease pain by 25% overall Baseline: Goal status: New   Long term PT goals Target date:02/28/2023   Pt will improve PROM to 0-120 to improve functional mobility Baseline: Goal status: New Pt will improve  hip/knee strength to at least 4+/5 MMT to improve functional strength Baseline: Goal status: New Pt will improve FOTO to at least 64% functional to show improved function Baseline: Goal status: New Pt will reduce pain to overall less than 2-3/10 with usual activity and work activity without use of narcotics. Baseline: Goal status: New Pt will be able to ambulate community distances at least 200 ft with mild complaints without AD Baseline: Goal status: New   PLAN: PT FREQUENCY: 1-3 times per week    PT DURATION: 8-12 weeks   PLANNED INTERVENTIONS (unless contraindicated): aquatic PT, Canalith repositioning, cryotherapy, Electrical stimulation, Iontophoresis with 4 mg/ml dexamethasome, Moist heat, traction, Ultrasound, gait training, Therapeutic exercise, balance training, neuromuscular re-education, patient/family education, prosthetic training, manual techniques, passive ROM, dry needling, taping, vasopnuematic device, vestibular, spinal manipulations, joint manipulations   PLAN FOR NEXT SESSION: review/update HEP PRN. Knee flexion ROM and quad strengthening ( Vaso at end for pain and swelling).    Deniece Ree PT DPT PN2

## 2023-01-15 ENCOUNTER — Telehealth: Payer: Self-pay | Admitting: *Deleted

## 2023-01-15 ENCOUNTER — Ambulatory Visit (INDEPENDENT_AMBULATORY_CARE_PROVIDER_SITE_OTHER): Payer: Medicare Other | Admitting: Physical Therapy

## 2023-01-15 ENCOUNTER — Encounter: Payer: Self-pay | Admitting: Physical Therapy

## 2023-01-15 DIAGNOSIS — M25562 Pain in left knee: Secondary | ICD-10-CM

## 2023-01-15 DIAGNOSIS — R262 Difficulty in walking, not elsewhere classified: Secondary | ICD-10-CM | POA: Diagnosis not present

## 2023-01-15 DIAGNOSIS — G8929 Other chronic pain: Secondary | ICD-10-CM | POA: Diagnosis not present

## 2023-01-15 DIAGNOSIS — M6281 Muscle weakness (generalized): Secondary | ICD-10-CM

## 2023-01-15 NOTE — Telephone Encounter (Signed)
Patient called and states he is having increased swelling in his surgical lower extremity below the knee all the way to the foot. Foot is very puffy and hard to find a shoe he can fit in he states. Calf and lower extremity not tender to touch he reports. Is having some pain in ball of his foot that feels like a stone bruise as well. Will see him when he comes in today at 1:45 pm for therapy and look at calf. He is 30 days post op tomorrow. Just FYI.

## 2023-01-15 NOTE — Therapy (Signed)
OUTPATIENT PHYSICAL THERAPY LOWER EXTREMITY TREATMENT   Patient Name: Frank [name] MRN: [id number] DOB:1951-07-18, 72 y.o., male Today's Date: 01/15/2023  END OF SESSION:  PT End of Session - 01/15/23 1422     Visit Number 4    Number of Visits 12    Date for PT Re-Evaluation 02/28/23    Authorization Type [employer] MEDICARE    Progress Note Due on Visit 10    PT Start Time 1355   nurse case manager working with patient at start of session   PT Stop Time 1427    PT Time Calculation (min) 32 min    Activity Tolerance Patient tolerated treatment well    Behavior During Therapy [facility] for tasks assessed/performed                Past Medical History:  Diagnosis Date   Allergy    Asthma    Coronary artery disease    GERD (gastroesophageal reflux disease)    Headache(784.0)    Hypertension    Past Surgical History:  Procedure Laterality Date   [place], 2009   kidney stones  05/20/1995   KNEE SURGERY  02/26/1989   MITRAL VALVE REPAIR  01/02/2022   NASAL SINUS SURGERY  10/24/1992   RIGHT/LEFT HEART CATH AND CORONARY ANGIOGRAPHY N/A 11/15/2021   Procedure: RIGHT/LEFT HEART CATH AND CORONARY ANGIOGRAPHY;  Surgeon: [name], MD;  Location: [place] [facility];  Service: Cardiovascular;  Laterality: N/A;   ROTATOR CUFF REPAIR Left 04/25/1993   TEE WITHOUT CARDIOVERSION N/A 08/24/2021   Procedure: TRANSESOPHAGEAL ECHOCARDIOGRAM (TEE);  Surgeon: Buford Dresser, MD;  Location: [facility] ENDOSCOPY;  Service: Cardiovascular;  Laterality: N/A;   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   Procedure: LEFT TOTAL KNEE ARTHROPLASTY;  Surgeon: Meredith Pel, MD;  Location: [place];  Service: Orthopedics;  Laterality: Left;   Patient Active Problem List   Diagnosis Date Noted   Arthritis of knee 12/18/2022   Nonrheumatic mitral valve regurgitation s/p repair 2023    Peroneal tendinitis of right lower extremity 04/25/2021   Benign  localized prostatic hyperplasia with lower urinary tract symptoms (LUTS) 09/27/2020   Wheezing 09/14/2020   Chondrocalcinosis 03/14/2020   Dyslipidemia 09/23/2017   Bilateral primary osteoarthritis of knee 09/02/2017   Neck pain 02/15/2017   Primary osteoarthritis of right hip 02/15/2017   Tear of right acetabular labrum 02/15/2017   Right leg pain 01/04/2017   Obese 08/17/2015   Elevated blood sugar 08/17/2015   Shingles 09/10/2014   Dyspnea 123XX123   HERNIA, UMBILICAL 123XX123   Essential hypertension 01/30/2008   Allergic rhinitis 01/30/2008   GERD 01/30/2008   HEADACHE 01/30/2008    PCP: [name], MD  REFERRING PROVIDER: Meredith Pel, MD  REFERRING DIAG: Bilateral primary osteoarthritis of knee [M17.0], S/P total knee arthroplasty, left [id number]   THERAPY DIAG:  Chronic pain of left knee  Pain in joint of left knee  Muscle weakness (generalized)  Difficulty walking  Rationale for Evaluation and Treatment: Rehabilitation  ONSET DATE: 12/18/2022  SUBJECTIVE:   SUBJECTIVE STATEMENT:  Doing well, had a good weekend. Feeling decent, hurt a little but eased off later. Leg is still swollen.    PERTINENT HISTORY: Asthma, GERD, HTN PAIN:  Are you having pain? Yes: NPRS scale: 2/10 Pain location: L knee  Pain description: Achy pain at surgical site Aggravating factors: Walking,  Relieving factors: Rest, ice  PRECAUTIONS: Knee  WEIGHT BEARING RESTRICTIONS: No  FALLS:  Has patient fallen in last 6 months? No  LIVING ENVIRONMENT: Lives with: lives with their spouse Lives in: House/apartment Stairs: Yes: External: 1-3 steps; bilateral but cannot reach both Has following equipment at home: Single point cane, Walker - 4 wheeled, and shower chair  OCCUPATION: Retired  Independent PLOF: [place]: Get back to functional mobility and prep for R TKA    OBJECTIVE:   DIAGNOSTIC FINDINGS:   1. Tricompartmental  osteoarthritis of the left knee, severe within the medial compartment. 2. Complex tearing/maceration of the medial meniscus. 3. Intrasubstance degeneration of the lateral meniscal posterior horn with presumed degenerative tearing. 4. Severe mucoid degeneration of the posterior cruciate ligament. ACL is not well seen and may be chronically torn or severely degenerated. 5. Large joint effusion containing numerous intra-articular loose bodies. 6. Small Baker's cyst.  PATIENT SURVEYS:  FOTO 50.16%, 64% in 12 visits.   COGNITION: Overall cognitive status: Within functional limits for tasks assessed     SENSATION: WFL  PALPATION: Tenderness around surgical site.   LOWER EXTREMITY ROM:  Active ROM Right eval Left eval Left 01/11/23  Knee flexion 113 90 AAROM 103*, 99* AROM   Knee extension 0 -4    (Blank rows = not tested)  LOWER EXTREMITY MMT:  MMT Right eval Left eval  Hip flexion 4 4  Knee flexion 4+ 4  Knee extension 4 4   (Blank rows = not tested)   FUNCTIONAL TESTS:  5 times sit to stand: 20.91 sec  Pt is able to ascend/ descend stairs with step too gait pattern and use of unilateral railing.   GAIT: Distance walked: 72ft  Assistive device utilized: Environmental consultant - 4 wheeled Level of assistance: Complete Independence Comments: Pt walks with slow gait with limited stance time on L LE.    TODAY'S TREATMENT:                                                                                                                              DATE:   01/15/23  TherEx  Scifit bike seat 7 x6 minutes (full rotations) SAQS 3# 2x15  Bridges x15 with progressively more knee bending after every 5 reps Self knee flexion stretch 8 inch step 10x10 second holds   Squats in // bars x20 with focus on knee flexion L LE  Education on edema control- elevation, ice, ankle pumps, compression stockings    01/11/23  TherEx  Scifit bike seat 7 x6 minutes (full rotations) SAQs 3# x15  with 3 second holds SLR + quad x15 0# LAQs 3# x15 with 3 second holds    Manual  Patella mobs all directions  Tib on femur joint mobs for improved flexion x3 rounds Quad MET + flexion AAROM x10  01/08/23  TherEx  Scifit bike 8 progressing to seat 6,  full rotations for ROM x6 minutes  SAQ 3# x15 with 5 second holds SLR + quad set 1x4, then 1x8  0# still with quad lag  Bridges 1x10  LAQs 3# 5 second holds x10 Gait training heel-toe pattern in clinic distances     Manual  Patellar mobs grade III L LE quad MET + flexion AAROM     EVAL  Creating, reviewing, and completing below HEP   Performed 10 reps of each of HEP exercises to ensure form was correct with no adverse effects.    PATIENT EDUCATION:  Education details: Educated pt on anatomy and physiology of current symptoms, FOTO, diagnosis, prognosis, HEP,  and POC. Person educated: Patient and Spouse Education method: Explanation Education comprehension: verbalized understanding and returned demonstration  HOME EXERCISE PROGRAM: Access Code: [email or web address] URL: [email or web address] Date: 01/03/2023 Prepared by: Rudi Heap  Exercises - Seated Knee Flexion AAROM  - 2 x daily - 7 x weekly - 2 sets - 10 reps - 2-5 hold - Seated Long Arc Quad  - 2 x daily - 7 x weekly - 2 sets - 10 reps - Sit to Stand  - 2 x daily - 7 x weekly - 2 sets - 10 reps - Long Sitting Quad Set  - 1 x daily - 7 x weekly - 3 sets - 10 reps - Supine Heel Slide with Strap  - 2 x daily - 7 x weekly - 2 sets - 10 reps - 2-5 hold   ASSESSMENT:  CLINICAL IMPRESSION:   Antoinio arrives today doing well, spent 10 minutes with RN case manager at beginning of session examining edema (not included in billing). Otherwise continued working on functional strengthening and ROM today. Making steady progress, still very swollen but was educated on edema control techniques by RN case manager and myself during this session. Will continue efforts.    OBJECTIVE IMPAIRMENTS: decreased activity tolerance, difficulty walking, decreased balance, decreased endurance, decreased mobility, decreased ROM, decreased strength, impaired flexibility, impaired UE/LE use, postural dysfunction, and pain.  ACTIVITY LIMITATIONS: bending, lifting, carry, locomotion, cleaning, community activity, driving, and or occupation  PERSONAL FACTORS: Asthma, GERD, HTN are also affecting patient's functional outcome.  REHAB POTENTIAL: Good  CLINICAL DECISION MAKING: Stable/uncomplicated  EVALUATION COMPLEXITY: Low    GOALS: Short term PT Goals Target date: 01/03/2023     Pt will be I and compliant with HEP. Baseline:  Goal status: New Pt will decrease pain by 25% overall Baseline: Goal status: New   Long term PT goals Target date:02/28/2023   Pt will improve PROM to 0-120 to improve functional mobility Baseline: Goal status: New Pt will improve  hip/knee strength to at least 4+/5 MMT to improve functional strength Baseline: Goal status: New Pt will improve FOTO to at least 64% functional to show improved function Baseline: Goal status: New Pt will reduce pain to overall less than 2-3/10 with usual activity and work activity without use of narcotics. Baseline: Goal status: New Pt will be able to ambulate community distances at least 200 ft with mild complaints without AD Baseline: Goal status: New   PLAN: PT FREQUENCY: 1-3 times per week    PT DURATION: 8-12 weeks   PLANNED INTERVENTIONS (unless contraindicated): aquatic PT, Canalith repositioning, cryotherapy, Electrical stimulation, Iontophoresis with 4 mg/ml dexamethasome, Moist heat, traction, Ultrasound, gait training, Therapeutic exercise, balance training, neuromuscular re-education, patient/family education, prosthetic training, manual techniques, passive ROM, dry needling, taping, vasopnuematic device, vestibular, spinal manipulations, joint manipulations   PLAN FOR NEXT SESSION:  review/update HEP PRN. Knee flexion ROM and quad strengthening ( Vaso at end for pain and swelling).    Deniece Ree  PT DPT PN2

## 2023-01-16 NOTE — Telephone Encounter (Signed)
Yes, seen in PT. There is some swelling bilaterally in lower extremity, but no calf tenderness or redness. Doing therapy without incidence. Does have a sore area under ball of foot he reports that feels like "a stone bruise". Recommended by therapy to change shoes to see if this improves. No chest pain or SOB.

## 2023-01-16 NOTE — Telephone Encounter (Signed)
Had no calf tenderness or significant swelling or any sign of infection when [facility] saw him at PT appointment as reported to me.  Reach out with any concerns in the meantime. Sees Dr Marlou Sa in ~2 weeks

## 2023-01-17 ENCOUNTER — Other Ambulatory Visit: Payer: Self-pay | Admitting: Surgical

## 2023-01-17 ENCOUNTER — Telehealth: Payer: Self-pay | Admitting: *Deleted

## 2023-01-17 MED ORDER — HYDROCODONE-ACETAMINOPHEN 5-325 MG PO TABS: 1.0000 | ORAL_TABLET | Freq: Four times a day (QID) | ORAL | 0 refills | Status: DC | PRN

## 2023-01-17 NOTE — Telephone Encounter (Signed)
Call from patient requesting refill of pain medication prior to the weekend. Swelling in leg has decreased with compression and elevation. Still no calf pain, chest pain or SOB. Doing well. Thanks.

## 2023-01-17 NOTE — Telephone Encounter (Signed)
Sent in

## 2023-01-18 ENCOUNTER — Ambulatory Visit: Payer: Medicare Other | Admitting: Physical Therapy

## 2023-01-18 ENCOUNTER — Ambulatory Visit (INDEPENDENT_AMBULATORY_CARE_PROVIDER_SITE_OTHER): Payer: Medicare Other | Admitting: Surgical

## 2023-01-18 ENCOUNTER — Encounter: Payer: Self-pay | Admitting: Physical Therapy

## 2023-01-18 ENCOUNTER — Other Ambulatory Visit: Payer: Self-pay | Admitting: Surgical

## 2023-01-18 ENCOUNTER — Other Ambulatory Visit (HOSPITAL_BASED_OUTPATIENT_CLINIC_OR_DEPARTMENT_OTHER): Payer: Self-pay | Admitting: Surgical

## 2023-01-18 ENCOUNTER — Ambulatory Visit (HOSPITAL_BASED_OUTPATIENT_CLINIC_OR_DEPARTMENT_OTHER)
Admission: RE | Admit: 2023-01-18 | Discharge: 2023-01-18 | Disposition: A | Discharge status: Home / Self Care | Payer: Medicare Other | Source: Ambulatory Visit | Attending: Surgical | Admitting: Surgical

## 2023-01-18 DIAGNOSIS — M79605 Pain in left leg: Secondary | ICD-10-CM | POA: Diagnosis not present

## 2023-01-18 DIAGNOSIS — R600 Localized edema: Secondary | ICD-10-CM

## 2023-01-18 DIAGNOSIS — Z96652 Presence of left artificial knee joint: Secondary | ICD-10-CM

## 2023-01-18 DIAGNOSIS — R262 Difficulty in walking, not elsewhere classified: Secondary | ICD-10-CM

## 2023-01-18 DIAGNOSIS — M25562 Pain in left knee: Secondary | ICD-10-CM

## 2023-01-18 DIAGNOSIS — M6281 Muscle weakness (generalized): Secondary | ICD-10-CM

## 2023-01-18 DIAGNOSIS — G8929 Other chronic pain: Secondary | ICD-10-CM

## 2023-01-18 MED ORDER — COLCHICINE 0.6 MG PO TABS: ORAL_TABLET | ORAL | 0 refills | Status: DC

## 2023-01-18 NOTE — Therapy (Signed)
Mr. Sinex arrives today with subjective as follows:  Woke up at 430am due to pain in the left foot, its also more swollen. Foot felt good yesterday, got up and did exercises and everything but today its been a lot more swollen than it was. Very painful to walk on. We sent a couple messages to [employer] the case RN but haven't heard anything back yet. Can't even pump my foot.    Per my exam, his L LE was grossly more edematous (with pitting) than in prior sessions- additionally he did have increased erythema and heat in L LE/foot as well. Also having severe pain in area of MTP joint of great toe. Having a hard time bearing weight on surgical LE today.  Opted to hold PT today, escorted him downstairs to be further examined and cleared by medical team.  Deniece Ree PT DPT PN2

## 2023-01-19 ENCOUNTER — Encounter: Payer: Self-pay | Admitting: Surgical

## 2023-01-19 NOTE — Progress Notes (Signed)
Post-Op Visit Note   [name]           Date of Birth: Mar 11, 1951           MRN: [id number] Visit Date: 01/18/2023 PCP: [name], MD   Assessment & Plan:  Chief Complaint:  Chief Complaint  Patient presents with   Left Knee - Routine Post Op   Visit Diagnoses:  1. S/P total knee arthroplasty, left     Plan: Patient is a 72 year old male who has history of left total knee arthroplasty.  He is about a month out from this procedure.  He was scheduled to see physical therapy today but due to new swelling and pain, he was added into the schedule.  He states that pain began abruptly around 8 PM last night and worsened over the course of the night to the point where it woke him up around 4 AM this morning.  He localizes pain to the first MTP joint of the operative leg.  He has really no increased pain in his calf or knee.  No fevers or chills.  No change in the appearance of the incision.  He denies any history of gout or pseudogout previously.  Does not feel unwell.  He has a normal appetite.  He is able to bear weight but he is walking quite gingerly.  On exam, patient has incision over the anterior aspect of the left knee that is well-healed without evidence of infection or dehiscence.  No cellulitis.  He has no effusion.  No pain with passive motion of the knee with range of motion from 0 degrees extension to further than 90 degrees of knee flexion.  No calf tenderness.  Negative Homans' sign.  Patient has palpable DP pulse of the left foot.  He has a little bit of redness that is noted overlying the first MTP joint of the left foot.  He has exquisite tenderness overlying the MTP joint but no pain throughout the rest of the foot or tenderness.  He has no pain with passive motion of the ankle or subtalar joint.  He does have severe pain with passive motion of the first MTP joint.  Plan is ultrasound examination to further evaluate for DVT given acute onset of pain and the  fairly moderate amount of swelling that he has throughout the left foot extending to the left ankle.  Really does not have any calf pain or tenderness which is reassuring but need to rule [place] out with his recent surgery.  He has been compliant with taking aspirin.  More likely, suspect that patient has a first time episode of gout with onset of symptoms acutely in the first MTP joint.  He has never had this before.  We will see what the ultrasound shows.  Ultrasound examination demonstrates no evidence of DVT in the left lower extremity.  Plan is continue with aspirin for DVT prophylaxis.  Prescribed colchicine for symptomatic relief.  Will see how this does for him and follow-up at his next scheduled appointment.  Call if he has any continued symptoms.  Follow-Up Instructions: No follow-ups on file.   Orders:  Orders Placed This Encounter  Procedures   US Venous Img Lower Unilateral Left (DVT)   No orders of the defined types were placed in this encounter.   Imaging: US Venous Img Lower Unilateral Left (DVT)  Result Date: 01/18/2023 CLINICAL DATA:  Leg pain EXAM: LEFT LOWER EXTREMITY VENOUS DOPPLER ULTRASOUND TECHNIQUE: Gray-scale sonography with  compression, as well as color and duplex ultrasound, were performed to evaluate the deep venous system(s) from the level of the common femoral vein through the popliteal and proximal calf veins. COMPARISON:  None Available. FINDINGS: VENOUS Normal compressibility of the common femoral, superficial femoral, and popliteal veins, as well as the visualized calf veins. Visualized portions of profunda femoral vein and great saphenous vein unremarkable. No filling defects to suggest DVT on grayscale or color Doppler imaging. Doppler waveforms show normal direction of venous flow, normal respiratory plasticity and response to augmentation. Limited views of the contralateral common femoral vein are unremarkable. OTHER None. Limitations: none IMPRESSION: Negative for  DVT. Electronically Signed   By: Merilyn Baba M.D.   On: 01/18/2023 16:44    PMFS History: Patient Active Problem List   Diagnosis Date Noted   Arthritis of knee 12/18/2022   Nonrheumatic mitral valve regurgitation s/p repair 2023    Peroneal tendinitis of right lower extremity 04/25/2021   Benign localized prostatic hyperplasia with lower urinary tract symptoms (LUTS) 09/27/2020   Wheezing 09/14/2020   Chondrocalcinosis 03/14/2020   Dyslipidemia 09/23/2017   Bilateral primary osteoarthritis of knee 09/02/2017   Neck pain 02/15/2017   Primary osteoarthritis of right hip 02/15/2017   Tear of right acetabular labrum 02/15/2017   Right leg pain 01/04/2017   Obese 08/17/2015   Elevated blood sugar 08/17/2015   Shingles 09/10/2014   Dyspnea 123XX123   HERNIA, UMBILICAL 123XX123   Essential hypertension 01/30/2008   Allergic rhinitis 01/30/2008   GERD 01/30/2008   HEADACHE 01/30/2008   Past Medical History:  Diagnosis Date   Allergy    Asthma    Coronary artery disease    GERD (gastroesophageal reflux disease)    Headache(784.0)    Hypertension     Family History  Problem Relation Age of Onset   Kidney disease Mother    COPD Father    Pneumonia Father    Cardiomyopathy Brother    Hypertension Sister    Heart disease Brother    Hypertension Brother    Hypertension Brother    Hypertension Sister    Diabetes Sister    Osteoarthritis Sister     Past Surgical History:  Procedure Laterality Date   [place], 2009   kidney stones  05/20/1995   KNEE SURGERY  02/26/1989   MITRAL VALVE REPAIR  01/02/2022   NASAL SINUS SURGERY  10/24/1992   RIGHT/LEFT HEART CATH AND CORONARY ANGIOGRAPHY N/A 11/15/2021   Procedure: RIGHT/LEFT HEART CATH AND CORONARY ANGIOGRAPHY;  Surgeon: [name], MD;  Location: [place] [facility];  Service: Cardiovascular;  Laterality: N/A;   ROTATOR CUFF REPAIR Left 04/25/1993   TEE WITHOUT CARDIOVERSION  N/A 08/24/2021   Procedure: TRANSESOPHAGEAL ECHOCARDIOGRAM (TEE);  Surgeon: Buford Dresser, MD;  Location: [facility] ENDOSCOPY;  Service: Cardiovascular;  Laterality: N/A;   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   Procedure: LEFT TOTAL KNEE ARTHROPLASTY;  Surgeon: Meredith Pel, MD;  Location: [place];  Service: Orthopedics;  Laterality: Left;   Social History   Occupational History   Not on file  Tobacco Use   Smoking status: Former   Smokeless tobacco: Never   Tobacco comments:    quit in 1972  Vaping Use   Vaping Use: Never used  Substance and Sexual Activity   Alcohol use: No   Drug use: No   Sexual activity: Not on file

## 2023-01-22 ENCOUNTER — Ambulatory Visit: Payer: Medicare Other | Admitting: Rehabilitative and Restorative Service Providers"

## 2023-01-22 ENCOUNTER — Encounter: Payer: Self-pay | Admitting: Rehabilitative and Restorative Service Providers"

## 2023-01-22 DIAGNOSIS — R262 Difficulty in walking, not elsewhere classified: Secondary | ICD-10-CM | POA: Diagnosis not present

## 2023-01-22 DIAGNOSIS — G8929 Other chronic pain: Secondary | ICD-10-CM

## 2023-01-22 DIAGNOSIS — R600 Localized edema: Secondary | ICD-10-CM | POA: Diagnosis not present

## 2023-01-22 DIAGNOSIS — M6281 Muscle weakness (generalized): Secondary | ICD-10-CM

## 2023-01-22 DIAGNOSIS — M25562 Pain in left knee: Secondary | ICD-10-CM

## 2023-01-22 NOTE — Therapy (Signed)
OUTPATIENT PHYSICAL THERAPY TREATMENT   Patient Name: Frank [name] MRN: [id number] DOB:February 05, 1951, 72 y.o., male Today's Date: 01/22/2023  END OF SESSION:  PT End of Session - 01/22/23 1048     Visit Number 5    Number of Visits 12    Date for PT Re-Evaluation 02/28/23    Authorization Type [employer] MEDICARE    Progress Note Due on Visit 10    PT Start Time 1048    PT Stop Time 1127    PT Time Calculation (min) 39 min    Activity Tolerance Patient tolerated treatment well    Behavior During Therapy WFL for tasks assessed/performed                 Past Medical History:  Diagnosis Date   Allergy    Asthma    Coronary artery disease    GERD (gastroesophageal reflux disease)    Headache(784.0)    Hypertension    Past Surgical History:  Procedure Laterality Date   [place], 2009   kidney stones  05/20/1995   KNEE SURGERY  02/26/1989   MITRAL VALVE REPAIR  01/02/2022   NASAL SINUS SURGERY  10/24/1992   RIGHT/LEFT HEART CATH AND CORONARY ANGIOGRAPHY N/A 11/15/2021   Procedure: RIGHT/LEFT HEART CATH AND CORONARY ANGIOGRAPHY;  Surgeon: [name], MD;  Location: [place] [facility];  Service: Cardiovascular;  Laterality: N/A;   ROTATOR CUFF REPAIR Left 04/25/1993   TEE WITHOUT CARDIOVERSION N/A 08/24/2021   Procedure: TRANSESOPHAGEAL ECHOCARDIOGRAM (TEE);  Surgeon: Buford Dresser, MD;  Location: [facility] ENDOS[facility];  Service: Cardiovascular;  Laterality: N/A;   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   Procedure: LEFT TOTAL KNEE ARTHROPLASTY;  Surgeon: Meredith Pel, MD;  Location: [place];  Service: Orthopedics;  Laterality: Left;   Patient Active Problem List   Diagnosis Date Noted   Arthritis of knee 12/18/2022   Nonrheumatic mitral valve regurgitation s/p repair 2023    Peroneal tendinitis of right lower extremity 04/25/2021   Benign localized prostatic hyperplasia with lower urinary tract symptoms (LUTS) 09/27/2020    Wheezing 09/14/2020   Chondrocalcinosis 03/14/2020   Dyslipidemia 09/23/2017   Bilateral primary osteoarthritis of knee 09/02/2017   Neck pain 02/15/2017   Primary osteoarthritis of right hip 02/15/2017   Tear of right acetabular labrum 02/15/2017   Right leg pain 01/04/2017   Obese 08/17/2015   Elevated blood sugar 08/17/2015   Shingles 09/10/2014   Dyspnea 123XX123   HERNIA, UMBILICAL 123XX123   Essential hypertension 01/30/2008   Allergic rhinitis 01/30/2008   GERD 01/30/2008   HEADACHE 01/30/2008    PCP: [name], MD  REFERRING PROVIDER: Meredith Pel, MD  REFERRING DIAG: Bilateral primary osteoarthritis of knee [M17.0], S/P total knee arthroplasty, left [id number]   THERAPY DIAG:  Chronic pain of left knee  Pain in joint of left knee  Difficulty walking  Localized edema  Muscle weakness (generalized)  Rationale for Evaluation and Treatment: Rehabilitation  ONSET DATE: 12/18/2022  SUBJECTIVE:   SUBJECTIVE STATEMENT: He indicated doing much better than last week.  He indicated having no specific pain upon arrival in Lt knee but did report some trouble with Rt knee.    PERTINENT HISTORY: Asthma, GERD, HTN PAIN:  NPRS scale: 2/10 Pain location: Lt knee  Pain description: Achy pain at surgical site Aggravating factors: Walking,  Relieving factors: Rest, ice  PRECAUTIONS: Knee  WEIGHT BEARING RESTRICTIONS: No  FALLS:  Has patient fallen  in last 6 months? No  LIVING ENVIRONMENT: Lives with: lives with their spouse Lives in: House/apartment Stairs: Yes: External: 1-3 steps; bilateral but cannot reach both Has following equipment at home: Single point cane, Walker - 4 wheeled, and shower chair  OCCUPATION: Retired  Independent PLOF: [place]: Get back to functional mobility and prep for R TKA    OBJECTIVE:   DIAGNOSTIC FINDINGS:   1. Tricompartmental osteoarthritis of the left knee, severe within the  medial compartment. 2. Complex tearing/maceration of the medial meniscus. 3. Intrasubstance degeneration of the lateral meniscal posterior horn with presumed degenerative tearing. 4. Severe mucoid degeneration of the posterior cruciate ligament. ACL is not well seen and may be chronically torn or severely degenerated. 5. Large joint effusion containing numerous intra-articular loose bodies. 6. Small Baker's cyst.  PATIENT SURVEYS:  FOTO 50.16%, 64% in 12 visits.   COGNITION: Overall cognitive status: Within functional limits for tasks assessed     SENSATION: WFL  PALPATION: Tenderness around surgical site.   LOWER EXTREMITY ROM:  Active ROM Right eval Left eval Left 01/11/23 Left 01/22/2023  Knee flexion 113 90 AAROM 103*, 99* AROM  110 AROM in supine heel slide  Knee extension 0 -4  0 AROM in LAQ seated   (Blank rows = not tested)  LOWER EXTREMITY MMT:  MMT Right eval Left eval  Hip flexion 4 4  Knee flexion 4+ 4  Knee extension 4 4   (Blank rows = not tested)   FUNCTIONAL TESTS:  5 times sit to stand: 20.91 sec  Pt is able to ascend/ descend stairs with step too gait pattern and use of unilateral railing.   GAIT: Distance walked: 31ft  Assistive device utilized: Environmental consultant - 4 wheeled Level of assistance: Complete Independence Comments: Pt walks with slow gait with limited stance time on L LE.    TODAY'S TREATMENT:                                                          DATE: 01/22/2023 TherEx Scifit bike seat 7 x6 minutes (full rotations) Seated Lt LAQ c pause in end ranges x 10 prior to ROM measurements Seated Lt SLR c quad set focus 2 x 10  Sit to stand to sit with slow lowering focus x 10 18 inch mat  TherActivity SPC use within clinic in Lt UE c mild verbal cues for sequencing.  Performed household distances on level surfaces between activity in clinic c supervision.  Lt UE use to help Rt knee.  Step on over and down 4 inch step with single hand on  rail, WB on Lt leg    TODAY'S TREATMENT:                                                          DATE: 01/15/23  TherEx Scifit bike seat 7 x6 minutes (full rotations) SAQS 3# 2x15  Bridges x15 with progressively more knee bending after every 5 reps Self knee flexion stretch 8 inch step 10x10 second holds   Squats in // bars x20 with focus on knee flexion L LE  Education  on edema control- elevation, ice, ankle pumps, compression stockings    TODAY'S TREATMENT:                                                          DATE: 01/11/23 TherEx Scifit bike seat 7 x6 minutes (full rotations) SAQs 3# x15 with 3 second holds SLR + quad x15 0# LAQs 3# x15 with 3 second holds    Manual Patella mobs all directions  Tib on femur joint mobs for improved flexion x3 rounds Quad MET + flexion AAROM x10  TODAY'S TREATMENT:                                                          DATE: 01/08/23 TherEx Scifit bike 8 progressing to seat 6,  full rotations for ROM x6 minutes  SAQ 3# x15 with 5 second holds SLR + quad set 1x4, then 1x8 0# still with quad lag  Bridges 1x10  LAQs 3# 5 second holds x10 Gait training heel-toe pattern in clinic distances    Manual Patellar mobs grade III Lt LE quad MET + flexion AAROM    PATIENT EDUCATION:  01/22/2023:  HEP update Person educated: Patient and Spouse Education method: Explanation Education comprehension: verbalized understanding and returned demonstration, handout  HOME EXERCISE PROGRAM: Access Code: [email or web address] URL: [email or web address] Date: 01/22/2023 Prepared by: Scot Jun  Exercises - Seated Knee Flexion AAROM (Mirrored)  - 3-5 x daily - 7 x weekly - 1 sets - 10 reps - 2-5 hold - Seated Long Arc Quad (Mirrored)  - 3-5 x daily - 7 x weekly - 1 sets - 10 reps - Sit to Stand  - 2 x daily - 7 x weekly - 1-2 sets - 10 reps - Seated Straight Leg Heel Taps  - 1-2 x daily - 7 x weekly - 3 sets - 10 reps - Seated Quad Set  (Mirrored)  - 3-5 x daily - 7 x weekly - 1 sets - 10 reps - 5 hold   ASSESSMENT:  CLINICAL IMPRESSION:  Improvement in ambulation control.  Discussed and tried SPC in Lt and Rt UE but Lt UE use made most sense due to Rt leg becoming more of the limitation vs Lt.  Progressed HEP to include strengthening progression.  Continued skilled PT services indicated at this time.   OBJECTIVE IMPAIRMENTS: decreased activity tolerance, difficulty walking, decreased balance, decreased endurance, decreased mobility, decreased ROM, decreased strength, impaired flexibility, impaired UE/LE use, postural dysfunction, and pain.  ACTIVITY LIMITATIONS: bending, lifting, carry, locomotion, cleaning, community activity, driving, and or occupation  PERSONAL FACTORS: Asthma, GERD, HTN are also affecting patient's functional outcome.  REHAB POTENTIAL: Good  CLINICAL DECISION MAKING: Stable/uncomplicated  EVALUATION COMPLEXITY: Low    GOALS: Short term PT Goals Target date: 01/03/2023     Pt will be I and compliant with HEP. Baseline:  Goal status: Met Pt will decrease pain by 25% overall Baseline: Goal status: Met   Long term PT goals Target date:02/28/2023   Pt will improve PROM to 0-120 to improve functional mobility Baseline: Goal status: on going 01/22/2023 Pt will improve  hip/knee strength  to at least 4+/5 MMT to improve functional strength Baseline: Goal status: on going 01/22/2023 Pt will improve FOTO to at least 64% functional to show improved function Baseline: Goal status: on going 01/22/2023 Pt will reduce pain to overall less than 2-3/10 with usual activity and work activity without use of narcotics. Baseline: Goal status: on going 01/22/2023 Pt will be able to ambulate community distances at least 200 ft with mild complaints without AD Baseline: Goal status: on going 01/22/2023   PLAN: PT FREQUENCY: 1-3 times per week    PT DURATION: 8-12 weeks   PLANNED INTERVENTIONS (unless  contraindicated): aquatic PT, Canalith repositioning, cryotherapy, Electrical stimulation, Iontophoresis with 4 mg/ml dexamethasome, Moist heat, traction, Ultrasound, gait training, Therapeutic exercise, balance training, neuromuscular re-education, patient/family education, prosthetic training, manual techniques, passive ROM, dry needling, taping, vasopnuematic device, vestibular, spinal manipulations, joint manipulations   PLAN FOR NEXT SESSION: Vaso for edema if still noted more.  Progressive strengthening (leg press, stair navigation) static balance.    Scot Jun, PT, DPT, OCS, ATC 01/22/23  11:44 AM

## 2023-01-23 ENCOUNTER — Telehealth: Payer: Self-pay | Admitting: *Deleted

## 2023-01-23 NOTE — Telephone Encounter (Signed)
Patient called requesting refill of pain medication before the weekend. Pharmacy on chart. Thank you.

## 2023-01-24 ENCOUNTER — Ambulatory Visit: Payer: Medicare Other | Admitting: Rehabilitative and Restorative Service Providers"

## 2023-01-24 ENCOUNTER — Encounter: Payer: Self-pay | Admitting: Rehabilitative and Restorative Service Providers"

## 2023-01-24 ENCOUNTER — Other Ambulatory Visit: Payer: Self-pay | Admitting: Orthopedic Surgery

## 2023-01-24 DIAGNOSIS — R600 Localized edema: Secondary | ICD-10-CM

## 2023-01-24 DIAGNOSIS — G8929 Other chronic pain: Secondary | ICD-10-CM | POA: Diagnosis not present

## 2023-01-24 DIAGNOSIS — R262 Difficulty in walking, not elsewhere classified: Secondary | ICD-10-CM | POA: Diagnosis not present

## 2023-01-24 DIAGNOSIS — M25562 Pain in left knee: Secondary | ICD-10-CM | POA: Diagnosis not present

## 2023-01-24 DIAGNOSIS — M6281 Muscle weakness (generalized): Secondary | ICD-10-CM

## 2023-01-24 MED ORDER — HYDROCODONE-ACETAMINOPHEN 5-325 MG PO TABS: 1.0000 | ORAL_TABLET | Freq: Four times a day (QID) | ORAL | 0 refills | Status: DC | PRN

## 2023-01-24 NOTE — Therapy (Signed)
OUTPATIENT PHYSICAL THERAPY TREATMENT   Patient Name: Frank [name] MRN: [id number] DOB:05/29/1951, 72 y.o., male Today's Date: 01/24/2023  END OF SESSION:  PT End of Session - 01/24/23 1144     Visit Number 6    Number of Visits 12    Date for PT Re-Evaluation 02/28/23    Authorization Type [employer] MEDICARE    Progress Note Due on Visit 10    PT Start Time 1140    PT Stop Time 1220    PT Time Calculation (min) 40 min    Activity Tolerance Patient tolerated treatment well    Behavior During Therapy [facility] for tasks assessed/performed                  Past Medical History:  Diagnosis Date   Allergy    Asthma    Coronary artery disease    GERD (gastroesophageal reflux disease)    Headache(784.0)    Hypertension    Past Surgical History:  Procedure Laterality Date   [place], 2009   kidney stones  05/20/1995   KNEE SURGERY  02/26/1989   MITRAL VALVE REPAIR  01/02/2022   NASAL SINUS SURGERY  10/24/1992   RIGHT/LEFT HEART CATH AND CORONARY ANGIOGRAPHY N/A 11/15/2021   Procedure: RIGHT/LEFT HEART CATH AND CORONARY ANGIOGRAPHY;  Surgeon: [name], MD;  Location: [place] [facility];  Service: Cardiovascular;  Laterality: N/A;   ROTATOR CUFF REPAIR Left 04/25/1993   TEE WITHOUT CARDIOVERSION N/A 08/24/2021   Procedure: TRANSESOPHAGEAL ECHOCARDIOGRAM (TEE);  Surgeon: Buford Dresser, MD;  Location: [facility] ENDOSCOPY;  Service: Cardiovascular;  Laterality: N/A;   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   Procedure: LEFT TOTAL KNEE ARTHROPLASTY;  Surgeon: Meredith Pel, MD;  Location: [place];  Service: Orthopedics;  Laterality: Left;   Patient Active Problem List   Diagnosis Date Noted   Arthritis of knee 12/18/2022   Nonrheumatic mitral valve regurgitation s/p repair 2023    Peroneal tendinitis of right lower extremity 04/25/2021   Benign localized prostatic hyperplasia with lower urinary tract symptoms (LUTS)  09/27/2020   Wheezing 09/14/2020   Chondrocalcinosis 03/14/2020   Dyslipidemia 09/23/2017   Bilateral primary osteoarthritis of knee 09/02/2017   Neck pain 02/15/2017   Primary osteoarthritis of right hip 02/15/2017   Tear of right acetabular labrum 02/15/2017   Right leg pain 01/04/2017   Obese 08/17/2015   Elevated blood sugar 08/17/2015   Shingles 09/10/2014   Dyspnea 123XX123   HERNIA, UMBILICAL [id number]   Essential hypertension 01/30/2008   Allergic rhinitis 01/30/2008   GERD 01/30/2008   HEADACHE 01/30/2008    PCP: [name], MD  REFERRING PROVIDER: Meredith Pel, MD  REFERRING DIAG: Bilateral primary osteoarthritis of knee [M17.0], S/P total knee arthroplasty, left [id number]   THERAPY DIAG:  Chronic pain of left knee  Pain in joint of left knee  Difficulty walking  Localized edema  Muscle weakness (generalized)  Rationale for Evaluation and Treatment: Rehabilitation  ONSET DATE: 12/18/2022  SUBJECTIVE:   SUBJECTIVE STATEMENT: He had no complaints from Lt leg, just the Rt leg.    PERTINENT HISTORY: Asthma, GERD, HTN  PAIN:  NPRS scale:  no pain in Lt knee Pain location: Lt knee  Pain description: Achy pain at surgical site Aggravating factors: Walking,  Relieving factors: Rest, ice  PRECAUTIONS: Knee  WEIGHT BEARING RESTRICTIONS: No  FALLS:  Has patient fallen in last 6 months? No  LIVING ENVIRONMENT: Lives with:  lives with their spouse Lives in: House/apartment Stairs: Yes: External: 1-3 steps; bilateral but cannot reach both Has following equipment at home: Single point cane, Walker - 4 wheeled, and shower chair  OCCUPATION: Retired  Independent PLOF: [place]: Get back to functional mobility and prep for R TKA    OBJECTIVE:   DIAGNOSTIC FINDINGS:   1. Tricompartmental osteoarthritis of the left knee, severe within the medial compartment. 2. Complex tearing/maceration of the medial  meniscus. 3. Intrasubstance degeneration of the lateral meniscal posterior horn with presumed degenerative tearing. 4. Severe mucoid degeneration of the posterior cruciate ligament. ACL is not well seen and may be chronically torn or severely degenerated. 5. Large joint effusion containing numerous intra-articular loose bodies. 6. Small Baker's cyst.  PATIENT SURVEYS:  FOTO 50.16%, 64% in 12 visits.   COGNITION: Overall cognitive status: Within functional limits for tasks assessed     SENSATION: WFL  PALPATION: Tenderness around surgical site.   LOWER EXTREMITY ROM:  Active ROM Right eval Left eval Left 01/11/23 Left 01/22/2023  Knee flexion 113 90 AAROM 103*, 99* AROM  110 AROM in supine heel slide  Knee extension 0 -4  0 AROM in LAQ seated   (Blank rows = not tested)  LOWER EXTREMITY MMT:  MMT Right eval Left eval  Hip flexion 4 4  Knee flexion 4+ 4  Knee extension 4 4   (Blank rows = not tested)   FUNCTIONAL TESTS:  5 times sit to stand: 20.91 sec  Pt is able to ascend/ descend stairs with step too gait pattern and use of unilateral railing.   GAIT: 01/24/2023:  SPC cane in Lt UE due to Rt knee complaints.  Able to perform independent within clinic as well.    Eval: Distance walked: 1ft  Assistive device utilized: Environmental consultant - 4 wheeled Level of assistance: Complete Independence Comments: Pt walks with slow gait with limited stance time on L LE.    TODAY'S TREATMENT:                                                          DATE: 01/24/2023 TherEx Scifit bike seat 7 x8 minutes (full rotations) Incline calf stretch Lt only 30 sec x 3 Knee extension machine double leg up, Lt leg lowering 5 lbs 2 x 10 Knee flexion machine Lt leg only 2 x 10 10 lbs   Neuro Re-ed Tandem stance x 1 bilateral  TherActivity (to improve ambulation/stairs) Step on over and down 6 inch step with single hand on rail, WB on Lt leg x 15 Leg press Lt only 2 x 15 50 lbs   TODAY'S  TREATMENT:                                                          DATE: 01/22/2023 TherEx Scifit bike seat 7 x6 minutes (full rotations) Seated Lt LAQ c pause in end ranges x 10 prior to ROM measurements Seated Lt SLR c quad set focus 2 x 10  Sit to stand to sit with slow lowering focus x 10 18 inch mat   TherActivity SPC use within  clinic in [place] UE c mild verbal cues for sequencing.  Performed household distances on level surfaces between activity in clinic c supervision.  Lt UE use to help Rt knee.  Step on over and down 4 inch step with single hand on rail, WB on Lt leg    TODAY'S TREATMENT:                                                          DATE: 01/15/23  TherEx Scifit bike seat 7 x6 minutes (full rotations) SAQS 3# 2x15  Bridges x15 with progressively more knee bending after every 5 reps Self knee flexion stretch 8 inch step 10x10 second holds   Squats in // bars x20 with focus on knee flexion L LE  Education on edema control- elevation, ice, ankle pumps, compression stockings    TODAY'S TREATMENT:                                                          DATE: 01/11/23 TherEx Scifit bike seat 7 x6 minutes (full rotations) SAQs 3# x15 with 3 second holds SLR + quad x15 0# LAQs 3# x15 with 3 second holds    Manual Patella mobs all directions  Tib on femur joint mobs for improved flexion x3 rounds Quad MET + flexion AAROM x10   PATIENT EDUCATION:  01/22/2023:  HEP update Person educated: Patient and Spouse Education method: Explanation Education comprehension: verbalized understanding and returned demonstration, handout  HOME EXERCISE PROGRAM: Access Code: [email or web address] URL: [email or web address] Date: 01/22/2023 Prepared by: Scot Jun  Exercises - Seated Knee Flexion AAROM (Mirrored)  - 3-5 x daily - 7 x weekly - 1 sets - 10 reps - 2-5 hold - Seated Long Arc Quad (Mirrored)  - 3-5 x daily - 7 x weekly - 1 sets - 10 reps - Sit to Stand  - 2  x daily - 7 x weekly - 1-2 sets - 10 reps - Seated Straight Leg Heel Taps  - 1-2 x daily - 7 x weekly - 3 sets - 10 reps - Seated Quad Set (Mirrored)  - 3-5 x daily - 7 x weekly - 1 sets - 10 reps - 5 hold   ASSESSMENT:  CLINICAL IMPRESSION:  Rt leg continued to show as more painful/limited today.  Continued plan for progressive WB strengthening to Lt leg to improve stairs, ambulation and other daily standing activity.  Fair control on static balance today.   OBJECTIVE IMPAIRMENTS: decreased activity tolerance, difficulty walking, decreased balance, decreased endurance, decreased mobility, decreased ROM, decreased strength, impaired flexibility, impaired UE/LE use, postural dysfunction, and pain.  ACTIVITY LIMITATIONS: bending, lifting, carry, locomotion, cleaning, community activity, driving, and or occupation  PERSONAL FACTORS: Asthma, GERD, HTN are also affecting patient's functional outcome.  REHAB POTENTIAL: Good  CLINICAL DECISION MAKING: Stable/uncomplicated  EVALUATION COMPLEXITY: Low    GOALS: Short term PT Goals Target date: 01/03/2023     Pt will be I and compliant with HEP. Baseline:  Goal status: Met Pt will decrease pain by 25% overall Baseline: Goal status: Met   Long term PT goals Target date:02/28/2023   Pt  will improve PROM to 0-120 to improve functional mobility Baseline: Goal status: on going 01/22/2023 Pt will improve  hip/knee strength to at least 4+/5 MMT to improve functional strength Baseline: Goal status: on going 01/22/2023 Pt will improve FOTO to at least 64% functional to show improved function Baseline: Goal status: on going 01/22/2023 Pt will reduce pain to overall less than 2-3/10 with usual activity and work activity without use of narcotics. Baseline: Goal status: on going 01/22/2023 Pt will be able to ambulate community distances at least 200 ft with mild complaints without AD Baseline: Goal status: on going 01/22/2023   PLAN: PT  FREQUENCY: 1-3 times per week    PT DURATION: 8-12 weeks   PLANNED INTERVENTIONS (unless contraindicated): aquatic PT, Canalith repositioning, cryotherapy, Electrical stimulation, Iontophoresis with 4 mg/ml dexamethasome, Moist heat, traction, Ultrasound, gait training, Therapeutic exercise, balance training, neuromuscular re-education, patient/family education, prosthetic training, manual techniques, passive ROM, dry needling, taping, vasopnuematic device, vestibular, spinal manipulations, joint manipulations   PLAN FOR NEXT SESSION: Lt leg strengthening without hurting Rt knee symptoms.  Static balance progressions.    Scot Jun, PT, DPT, OCS, ATC 01/24/23  12:17 PM

## 2023-01-24 NOTE — Telephone Encounter (Signed)
Sent yesterday. I know it was a busy day. Just don't want refill request to not be seen before weekend. Thank you.

## 2023-01-24 NOTE — Telephone Encounter (Signed)
Meds sent thx

## 2023-01-25 ENCOUNTER — Other Ambulatory Visit: Payer: Self-pay | Admitting: Surgical

## 2023-01-29 ENCOUNTER — Ambulatory Visit: Payer: Medicare Other | Admitting: Rehabilitative and Restorative Service Providers"

## 2023-01-29 ENCOUNTER — Encounter: Payer: Self-pay | Admitting: Rehabilitative and Restorative Service Providers"

## 2023-01-29 DIAGNOSIS — R600 Localized edema: Secondary | ICD-10-CM | POA: Diagnosis not present

## 2023-01-29 DIAGNOSIS — M25562 Pain in left knee: Secondary | ICD-10-CM

## 2023-01-29 DIAGNOSIS — M6281 Muscle weakness (generalized): Secondary | ICD-10-CM

## 2023-01-29 DIAGNOSIS — G8929 Other chronic pain: Secondary | ICD-10-CM

## 2023-01-29 DIAGNOSIS — R262 Difficulty in walking, not elsewhere classified: Secondary | ICD-10-CM

## 2023-01-29 NOTE — Therapy (Signed)
OUTPATIENT PHYSICAL THERAPY TREATMENT/ PROGRESS NOTE   Patient Name: Frank [name] MRN: [id number] DOB:09-03-1951, 72 y.o., male Today's Date: 01/29/2023  Progress Note Reporting Period 01/03/2023 to 01/29/2023  See note below for Objective Data and Assessment of Progress/Goals.   END OF SESSION:  PT End of Session - 01/29/23 1146     Visit Number 7    Number of Visits 12    Date for PT Re-Evaluation 02/28/23    Authorization Type [employer] MEDICARE    Progress Note Due on Visit 17    PT Start Time 1141    PT Stop Time 1220    PT Time Calculation (min) 39 min    Activity Tolerance Patient tolerated treatment well    Behavior During Therapy WFL for tasks assessed/performed               Past Medical History:  Diagnosis Date   Allergy    Asthma    Coronary artery disease    GERD (gastroesophageal reflux disease)    Headache(784.0)    Hypertension    Past Surgical History:  Procedure Laterality Date   [place], 2009   kidney stones  05/20/1995   KNEE SURGERY  02/26/1989   MITRAL VALVE REPAIR  01/02/2022   NASAL SINUS SURGERY  10/24/1992   RIGHT/LEFT HEART CATH AND CORONARY ANGIOGRAPHY N/A 11/15/2021   Procedure: RIGHT/LEFT HEART CATH AND CORONARY ANGIOGRAPHY;  Surgeon: [name], MD;  Location: [place] [facility];  Service: Cardiovascular;  Laterality: N/A;   ROTATOR CUFF REPAIR Left 04/25/1993   TEE WITHOUT CARDIOVERSION N/A 08/24/2021   Procedure: TRANSESOPHAGEAL ECHOCARDIOGRAM (TEE);  Surgeon: Buford Dresser, MD;  Location: [facility];  Service: Cardiovascular;  Laterality: N/A;   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   Procedure: LEFT TOTAL KNEE ARTHROPLASTY;  Surgeon: Meredith Pel, MD;  Location: [facility];  Service: Orthopedics;  Laterality: Left;   Patient Active Problem List   Diagnosis Date Noted   Arthritis of knee 12/18/2022   Nonrheumatic mitral valve regurgitation s/p repair 2023    Peroneal  tendinitis of right lower extremity 04/25/2021   Benign localized prostatic hyperplasia with lower urinary tract symptoms (LUTS) 09/27/2020   Wheezing 09/14/2020   Chondrocalcinosis 03/14/2020   Dyslipidemia 09/23/2017   Bilateral primary osteoarthritis of knee 09/02/2017   Neck pain 02/15/2017   Primary osteoarthritis of right hip 02/15/2017   Tear of right acetabular labrum 02/15/2017   Right leg pain 01/04/2017   Obese 08/17/2015   Elevated blood sugar 08/17/2015   Shingles 09/10/2014   Dyspnea [id number]   HERNIA, UMBILICAL [id number]   Essential hypertension 01/30/2008   Allergic rhinitis 01/30/2008   GERD 01/30/2008   HEADACHE 01/30/2008    PCP: [name], MD  REFERRING PROVIDER: Meredith Pel, MD  REFERRING DIAG: Bilateral primary osteoarthritis of knee [M17.0], S/P total knee arthroplasty, left [id number]   THERAPY DIAG:  Chronic pain of left knee  Pain in joint of left knee  Muscle weakness (generalized)  Difficulty walking  Localized edema  Rationale for Evaluation and Treatment: Rehabilitation  ONSET DATE: 12/18/2022  SUBJECTIVE:   SUBJECTIVE STATEMENT: He stated taking pain medicine less frequent.  Usually takes it for his Rt leg.    PERTINENT HISTORY: Asthma, GERD, HTN  PAIN:  NPRS scale:  no pain in Lt knee Pain location: Lt knee  Pain description: Achy pain at surgical site Aggravating factors: Walking,  Relieving factors: Rest, ice  PRECAUTIONS: Knee  WEIGHT BEARING RESTRICTIONS: No  FALLS:  Has patient fallen in last 6 months? No  LIVING ENVIRONMENT: Lives with: lives with their spouse Lives in: House/apartment Stairs: Yes: External: 1-3 steps; bilateral but cannot reach both Has following equipment at home: Single point cane, Walker - 4 wheeled, and shower chair  OCCUPATION: Retired  Independent PLOF: [place]: Get back to functional mobility and prep for R TKA    OBJECTIVE:   DIAGNOSTIC  FINDINGS:   1. Tricompartmental osteoarthritis of the left knee, severe within the medial compartment. 2. Complex tearing/maceration of the medial meniscus. 3. Intrasubstance degeneration of the lateral meniscal posterior horn with presumed degenerative tearing. 4. Severe mucoid degeneration of the posterior cruciate ligament. ACL is not well seen and may be chronically torn or severely degenerated. 5. Large joint effusion containing numerous intra-articular loose bodies. 6. Small Baker's cyst.  PATIENT SURVEYS:  01/29/2023:  FOTO update 63   Eval:  FOTO 50.16%, 64% in 12 visits.   COGNITION: Eval Overall cognitive status: Within functional limits for tasks assessed     SENSATION: Eval WFL  PALPATION: Eval: Tenderness around surgical site.   LOWER EXTREMITY ROM:  Active ROM Right eval Left eval Left 01/11/23 Left 01/22/2023  Knee flexion 113 90 AAROM 103*, 99* AROM  110 AROM in supine heel slide  Knee extension 0 -4  0 AROM in LAQ seated   (Blank rows = not tested)  LOWER EXTREMITY MMT:  MMT Right eval Left eval Left 01/29/2023  Hip flexion 4 4 5/5  Knee flexion 4+ 4 5/5  Knee extension 4 4 5/5 39, 38 lbs   (Blank rows = not tested)   FUNCTIONAL TESTS:  Eval:  5 times sit to stand: 20.91 sec  Pt is able to ascend/ descend stairs with step too gait pattern and use of unilateral railing.   GAIT: 01/24/2023:  SPC cane in Lt UE due to Rt knee complaints.  Able to perform independent within clinic as well.    Eval: Distance walked: 30ft  Assistive device utilized: Environmental consultant - 4 wheeled Level of assistance: Complete Independence Comments: Pt walks with slow gait with limited stance time on L LE.    TODAY'S TREATMENT:                                                          DATE: 01/29/2023 TherEx Scifit bike seat 7 x8 minutes (full rotations) Incline calf stretch Lt only 30 sec x 3 Knee extension machine double leg up, Lt leg lowering 10 lbs 3 x 10 Seated  SLR 2 x 10 bilateral 3 sec hold    TherActivity (to improve ambulation/stairs) Step on over and down 6 inch step with single hand on rail, WB on Lt leg x 15 Sit to stand to sit 18 inch chair x 5  TODAY'S TREATMENT:                                                          DATE: 01/24/2023 TherEx Scifit bike seat 7 x8 minutes (full rotations) Incline calf stretch Lt only 30 sec x 3  Knee extension machine double leg up, Lt leg lowering 5 lbs 2 x 10 Knee flexion machine Lt leg only 2 x 10 10 lbs   Neuro Re-ed Tandem stance x 1 bilateral  TherActivity (to improve ambulation/stairs) Step on over and down 6 inch step with single hand on rail, WB on Lt leg x 15 Leg press Lt only 2 x 15 50 lbs   TODAY'S TREATMENT:                                                          DATE: 01/22/2023 TherEx Scifit bike seat 7 x6 minutes (full rotations) Seated Lt LAQ c pause in end ranges x 10 prior to ROM measurements Seated Lt SLR c quad set focus 2 x 10  Sit to stand to sit with slow lowering focus x 10 18 inch mat   TherActivity SPC use within clinic in Lt UE c mild verbal cues for sequencing.  Performed household distances on level surfaces between activity in clinic c supervision.  Lt UE use to help Rt knee.  Step on over and down 4 inch step with single hand on rail, WB on Lt leg    TODAY'S TREATMENT:                                                          DATE: 01/15/23  TherEx Scifit bike seat 7 x6 minutes (full rotations) SAQS 3# 2x15  Bridges x15 with progressively more knee bending after every 5 reps Self knee flexion stretch 8 inch step 10x10 second holds   Squats in // bars x20 with focus on knee flexion L LE  Education on edema control- elevation, ice, ankle pumps, compression stockings    PATIENT EDUCATION:  01/22/2023:  HEP update Person educated: Patient and Spouse Education method: Explanation Education comprehension: verbalized understanding and returned demonstration,  handout  HOME EXERCISE PROGRAM: Access Code: [email or web address] URL: [email or web address] Date: 01/22/2023 Prepared by: Scot Jun  Exercises - Seated Knee Flexion AAROM (Mirrored)  - 3-5 x daily - 7 x weekly - 1 sets - 10 reps - 2-5 hold - Seated Long Arc Quad (Mirrored)  - 3-5 x daily - 7 x weekly - 1 sets - 10 reps - Sit to Stand  - 2 x daily - 7 x weekly - 1-2 sets - 10 reps - Seated Straight Leg Heel Taps  - 1-2 x daily - 7 x weekly - 3 sets - 10 reps - Seated Quad Set (Mirrored)  - 3-5 x daily - 7 x weekly - 1 sets - 10 reps - 5 hold   ASSESSMENT:  CLINICAL IMPRESSION:  Pt has attended 7 visits overall during course of treatment.  See objective data for updated information showing improvements towards goals.  Gains noted in strength and mobility for Lt knee.  Current complaints of reduced Lt knee pain and noted Rt knee symptoms from chronic Rt knee complaints are noted.   OBJECTIVE IMPAIRMENTS: decreased activity tolerance, difficulty walking, decreased balance, decreased endurance, decreased mobility, decreased ROM, decreased strength, impaired flexibility, impaired UE/LE use, postural dysfunction, and pain.  ACTIVITY LIMITATIONS: bending, lifting,  carry, locomotion, cleaning, community activity, driving, and or occupation  PERSONAL FACTORS: Asthma, GERD, HTN are also affecting patient's functional outcome.  REHAB POTENTIAL: Good  CLINICAL DECISION MAKING: Stable/uncomplicated  EVALUATION COMPLEXITY: Low    GOALS: Short term PT Goals Target date: 01/03/2023     Pt will be I and compliant with HEP. Baseline:  Goal status: Met Pt will decrease pain by 25% overall Baseline: Goal status: Met   Long term PT goals Target date:02/28/2023   Pt will improve PROM to 0-120 to improve functional mobility Baseline: Goal status:Met 01/29/2023 Pt will improve  hip/knee strength to at least 4+/5 MMT to improve functional strength Baseline: Goal status: on going Met  01/29/2023 Pt will improve FOTO to at least 64% functional to show improved function Baseline: Goal status: on going  01/29/2023 Pt will reduce pain to overall less than 2-3/10 with usual activity and work activity without use of narcotics. Baseline: Goal status: on going 01/29/2023 Pt will be able to ambulate community distances at least 200 ft with mild complaints without AD Baseline: Goal status:Met 01/29/2023   PLAN: PT FREQUENCY: 1-3 times per week    PT DURATION: 8-12 weeks   PLANNED INTERVENTIONS (unless contraindicated): aquatic PT, Canalith repositioning, cryotherapy, Electrical stimulation, Iontophoresis with 4 mg/ml dexamethasome, Moist heat, traction, Ultrasound, gait training, Therapeutic exercise, balance training, neuromuscular re-education, patient/family education, prosthetic training, manual techniques, passive ROM, dry needling, taping, vasopnuematic device, vestibular, spinal manipulations, joint manipulations   PLAN FOR NEXT SESSION: Continued strengthening and balance improvements c eyes on HEP transitioning as able.    Scot Jun, PT, DPT, OCS, ATC 01/29/23  12:16 PM

## 2023-01-30 ENCOUNTER — Ambulatory Visit (INDEPENDENT_AMBULATORY_CARE_PROVIDER_SITE_OTHER): Payer: Medicare Other | Admitting: Orthopedic Surgery

## 2023-01-30 DIAGNOSIS — Z96652 Presence of left artificial knee joint: Secondary | ICD-10-CM

## 2023-01-31 ENCOUNTER — Ambulatory Visit: Payer: Medicare Other | Admitting: Rehabilitative and Restorative Service Providers"

## 2023-01-31 ENCOUNTER — Encounter: Payer: Self-pay | Admitting: Rehabilitative and Restorative Service Providers"

## 2023-01-31 DIAGNOSIS — G8929 Other chronic pain: Secondary | ICD-10-CM

## 2023-01-31 DIAGNOSIS — R262 Difficulty in walking, not elsewhere classified: Secondary | ICD-10-CM | POA: Diagnosis not present

## 2023-01-31 DIAGNOSIS — M6281 Muscle weakness (generalized): Secondary | ICD-10-CM

## 2023-01-31 DIAGNOSIS — M25562 Pain in left knee: Secondary | ICD-10-CM | POA: Diagnosis not present

## 2023-01-31 DIAGNOSIS — R600 Localized edema: Secondary | ICD-10-CM | POA: Diagnosis not present

## 2023-01-31 NOTE — Therapy (Addendum)
OUTPATIENT PHYSICAL THERAPY TREATMENT /DISCHARGE   Patient Name: Frank [name] MRN: [id number] DOB:[birth year], 72 y.o., male Today's Date: 01/31/2023   END OF SESSION:  PT End of Session - 01/31/23 1103     Visit Number 8    Number of Visits 12    Date for PT Re-Evaluation 02/28/23    Authorization Type [employer] MEDICARE    Progress Note Due on Visit 17    PT Start Time 1055    PT Stop Time 1134    PT Time Calculation (min) 39 min    Activity Tolerance Patient tolerated treatment well    Behavior During Therapy WFL for tasks assessed/performed                Past Medical History:  Diagnosis Date   Allergy    Asthma    Coronary artery disease    GERD (gastroesophageal reflux disease)    Headache(784.0)    Hypertension    Past Surgical History:  Procedure Laterality Date   CARDIAC CATHETERIZATION     HERNIA REPAIR  1969, 2009   kidney stones  05/20/1995   KNEE SURGERY  02/26/1989   MITRAL VALVE REPAIR  01/02/2022   NASAL SINUS SURGERY  10/24/1992   RIGHT/LEFT HEART CATH AND CORONARY ANGIOGRAPHY N/A 11/15/2021   Procedure: RIGHT/LEFT HEART CATH AND CORONARY ANGIOGRAPHY;  Surgeon: Orbie Pyo, MD;  Location: [facility];  Service: Cardiovascular;  Laterality: N/A;   ROTATOR CUFF REPAIR Left 04/25/1993   TEE WITHOUT CARDIOVERSION N/A 08/24/2021   Procedure: TRANSESOPHAGEAL ECHOCARDIOGRAM (TEE);  Surgeon: Jodelle Red, MD;  Location: [facility] ENDOSCOPY;  Service: Cardiovascular;  Laterality: N/A;   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   Procedure: LEFT TOTAL KNEE ARTHROPLASTY;  Surgeon: Cammy Copa, MD;  Location: [facility];  Service: Orthopedics;  Laterality: Left;   Patient Active Problem List   Diagnosis Date Noted   Arthritis of knee 12/18/2022   Nonrheumatic mitral valve regurgitation s/p repair 2023    Peroneal tendinitis of right lower extremity 04/25/2021   Benign localized prostatic hyperplasia with lower urinary tract symptoms (LUTS)  09/27/2020   Wheezing 09/14/2020   Chondrocalcinosis 03/14/2020   Dyslipidemia 09/23/2017   Bilateral primary osteoarthritis of knee 09/02/2017   Neck pain 02/15/2017   Primary osteoarthritis of right hip 02/15/2017   Tear of right acetabular labrum 02/15/2017   Right leg pain 01/04/2017   Obese 08/17/2015   Elevated blood sugar 08/17/2015   Shingles 09/10/2014   Dyspnea 11/27/2011   HERNIA, UMBILICAL 08/02/2008   Essential hypertension 01/30/2008   Allergic rhinitis 01/30/2008   GERD 01/30/2008   HEADACHE 01/30/2008    PCP: Ardith Dark, MD  REFERRING PROVIDER: Cammy Copa, MD  REFERRING DIAG: Bilateral primary osteoarthritis of knee [M17.0], S/P total knee arthroplasty, left [N82.956]   THERAPY DIAG:  Chronic pain of left knee  Pain in joint of left knee  Muscle weakness (generalized)  Difficulty walking  Localized edema  Rationale for Evaluation and Treatment: Rehabilitation  ONSET DATE: 12/18/2022  SUBJECTIVE:   SUBJECTIVE STATEMENT: Reported good MD visit and felt like he could do his stuff at home at this point with Lt leg.    PERTINENT HISTORY: Asthma, GERD, HTN  PAIN:  NPRS scale:  no pain in Lt knee Pain location: Lt knee  Pain description: Achy pain at surgical site Aggravating factors: Walking,  Relieving factors: Rest, ice  PRECAUTIONS: Knee  WEIGHT BEARING RESTRICTIONS: No  FALLS:  Has patient fallen in  last 6 months? No  LIVING ENVIRONMENT: Lives with: lives with their spouse Lives in: House/apartment Stairs: Yes: External: 1-3 steps; bilateral but cannot reach both Has following equipment at home: Single point cane, Walker - 4 wheeled, and shower chair  OCCUPATION: Retired  Independent PLOF: Independent  PATIENT GOALS: Get back to functional mobility and prep for R TKA    OBJECTIVE:   DIAGNOSTIC FINDINGS:   1. Tricompartmental osteoarthritis of the left knee, severe within the medial compartment. 2. Complex  tearing/maceration of the medial meniscus. 3. Intrasubstance degeneration of the lateral meniscal posterior horn with presumed degenerative tearing. 4. Severe mucoid degeneration of the posterior cruciate ligament. ACL is not well seen and may be chronically torn or severely degenerated. 5. Large joint effusion containing numerous intra-articular loose bodies. 6. Small Baker's cyst.  PATIENT SURVEYS:  01/31/2023: FOTO update:  82  01/29/2023:  FOTO update 63  Eval:  FOTO 50.16%, 64% in 12 visits.   COGNITION: Eval Overall cognitive status: Within functional limits for tasks assessed     SENSATION: Eval WFL  PALPATION: Eval: Tenderness around surgical site.   LOWER EXTREMITY ROM:  Active ROM Right eval Left eval Left 01/11/23 Left 01/22/2023  Knee flexion 113 90 AAROM 103*, 99* AROM  110 AROM in supine heel slide  Knee extension 0 -4  0 AROM in LAQ seated   (Blank rows = not tested)  LOWER EXTREMITY MMT:  MMT Right eval Left eval Left 01/29/2023  Hip flexion 4 4 5/5  Knee flexion 4+ 4 5/5  Knee extension 4 4 5/5 39, 38 lbs   (Blank rows = not tested)   FUNCTIONAL TESTS:  Eval:  5 times sit to stand: 20.91 sec  Pt is able to ascend/ descend stairs with step too gait pattern and use of unilateral railing.   GAIT: 01/24/2023:  SPC cane in Lt UE due to Rt knee complaints.  Able to perform independent within clinic as well.   Eval: Distance walked: 17ft  Assistive device utilized: Environmental consultant - 4 wheeled Level of assistance: Complete Independence Comments: Pt walks with slow gait with limited stance time on L LE.    TODAY'S TREATMENT:                                                          DATE: 01/31/2023 TherEx Scifit bike seat 7 x8 minutes (full rotations) Incline calf stretch Lt only 30 sec x 3 Knee extension machine double leg up, Lt leg lowering 10 lbs 3 x 10 Seated SLR 2 x 10 bilateral 3 sec hold  Leg press Lt only 2x 15 50 lbs  Lateral step down 6 inch WB  on Lt x 15 with light handrail assist   Review of existing HEP for trial HEP period.   Neuro Re-ed SLS c contralateral leg touching corners of black mat x 8 each   TODAY'S TREATMENT:                                                          DATE: 01/29/2023 TherEx Scifit bike seat 7 x8 minutes (full rotations) Incline calf stretch Lt  only 30 sec x 3 Knee extension machine double leg up, Lt leg lowering 10 lbs 3 x 10 Seated SLR 2 x 10 bilateral 3 sec hold    TherActivity (to improve ambulation/stairs) Step on over and down 6 inch step with single hand on rail, WB on Lt leg x 15 Sit to stand to sit 18 inch chair x 5   TODAY'S TREATMENT:                                                          DATE: 01/24/2023 TherEx Scifit bike seat 7 x8 minutes (full rotations) Incline calf stretch Lt only 30 sec x 3 Knee extension machine double leg up, Lt leg lowering 5 lbs 2 x 10 Knee flexion machine Lt leg only 2 x 10 10 lbs   Neuro Re-ed Tandem stance x 1 bilateral  TherActivity (to improve ambulation/stairs) Step on over and down 6 inch step with single hand on rail, WB on Lt leg x 15 Leg press Lt only 2 x 15 50 lbs   TODAY'S TREATMENT:                                                          DATE: 01/22/2023 TherEx Scifit bike seat 7 x6 minutes (full rotations) Seated Lt LAQ c pause in end ranges x 10 prior to ROM measurements Seated Lt SLR c quad set focus 2 x 10  Sit to stand to sit with slow lowering focus x 10 18 inch mat   TherActivity SPC use within clinic in Lt UE c mild verbal cues for sequencing.  Performed household distances on level surfaces between activity in clinic c supervision.  Lt UE use to help Rt knee.  Step on over and down 4 inch step with single hand on rail, WB on Lt leg     PATIENT EDUCATION:  01/22/2023:  HEP update Person educated: Patient and Spouse Education method: Explanation Education comprehension: verbalized understanding and returned  demonstration, handout  HOME EXERCISE PROGRAM: Access Code: [email or web address] URL: [email or web address] Date: 01/31/2023 Prepared by: Chyrel Masson  Exercises - Seated Knee Flexion AAROM (Mirrored)  - 3-5 x daily - 7 x weekly - 1 sets - 10 reps - 2-5 hold - Seated Long Arc Quad (Mirrored)  - 3-5 x daily - 7 x weekly - 1 sets - 10 reps - Sit to Stand  - 2 x daily - 7 x weekly - 1-2 sets - 10 reps - Seated Straight Leg Heel Taps  - 1-2 x daily - 7 x weekly - 3 sets - 10 reps - Seated Quad Set (Mirrored)  - 3-5 x daily - 7 x weekly - 1 sets - 10 reps - 5 hold  Also discussed bike riding at home.    ASSESSMENT:  CLINICAL IMPRESSION:  At this time, Pt was more limited by Rt knee than Lt knee.  Good knowledge of HEP at this time allowed decision to try transition to HEP and a return to clinic if necessary.  Able to ambulate independent for Lt leg, using cane for Rt leg assistance at times.   Check  most recent objective data updates above.   Pt was in agreement with plan.   OBJECTIVE IMPAIRMENTS: decreased activity tolerance, difficulty walking, decreased balance, decreased endurance, decreased mobility, decreased ROM, decreased strength, impaired flexibility, impaired UE/LE use, postural dysfunction, and pain.  ACTIVITY LIMITATIONS: bending, lifting, carry, locomotion, cleaning, community activity, driving, and or occupation  PERSONAL FACTORS: Asthma, GERD, HTN are also affecting patient's functional outcome.  REHAB POTENTIAL: Good  CLINICAL DECISION MAKING: Stable/uncomplicated  EVALUATION COMPLEXITY: Low    GOALS: Short term PT Goals Target date: 01/03/2023     Pt will be I and compliant with HEP. Baseline:  Goal status: Met Pt will decrease pain by 25% overall Baseline: Goal status: Met   Long term PT goals Target date:02/28/2023   Pt will improve PROM to 0-120 to improve functional mobility Baseline: Goal status:Met 01/29/2023 Pt will improve  hip/knee strength to  at least 4+/5 MMT to improve functional strength Baseline: Goal status: Met 01/29/2023 Pt will improve FOTO to at least 64% functional to show improved function Baseline: Goal status: on going  01/29/2023 Pt will reduce pain to overall less than 2-3/10 with usual activity and work activity without use of narcotics. Baseline: Goal status: Met for Lt knee  01/29/2023 Pt will be able to ambulate community distances at least 200 ft with mild complaints without AD Baseline: Goal status:Met 01/29/2023   PLAN: PT FREQUENCY: 1-3 times per week    PT DURATION: 8-12 weeks   PLANNED INTERVENTIONS (unless contraindicated): aquatic PT, Canalith repositioning, cryotherapy, Electrical stimulation, Iontophoresis with 4 mg/ml dexamethasome, Moist heat, traction, Ultrasound, gait training, Therapeutic exercise, balance training, neuromuscular re-education, patient/family education, prosthetic training, manual techniques, passive ROM, dry needling, taping, vasopnuematic device, vestibular, spinal manipulations, joint manipulations   PLAN FOR NEXT SESSION: Trial HEP.    Chyrel Masson, PT, DPT, OCS, ATC 01/31/23  11:45 AM   PHYSICAL THERAPY DISCHARGE SUMMARY  Visits from Start of Care: 8  Current functional level related to goals / functional outcomes: See note   Remaining deficits: See note   Education / Equipment: HEP  Patient goals were met. Patient is being discharged due to not returning since the last visit.   Chyrel Masson, PT, DPT, OCS, ATC 03/04/23  1:29 PM

## 2023-01-31 NOTE — Progress Notes (Signed)
Post-Op Visit Note   [name]           Date of Birth: 29-Jun-1951           MRN: [id number] Visit Date: 01/30/2023 PCP: Frank Dark, MD   Assessment & Plan:  Chief Complaint:  Chief Complaint  Patient presents with   Left Knee - Routine Post Op   Visit Diagnoses:  1. S/P total knee arthroplasty, left     Plan: Frank [name] is now about 6 weeks out left total knee replacement.  Ambulating with a cane.  Doing well.  Has 1-2 more visits of physical therapy.  On examination range of motion is 0-1 10.  Ultrasound was negative for DVT.  Feels the palp is ankle.  Thinking about possible total knee replacement in the summer.  Taking Norco for his left knee but not too much.  Doing the bike.  We may remove the fluid and that right knee as needed.  Follow-up in 8 weeks for final check.  May post for right total knee replacement at that time.  Follow-Up Instructions: No follow-ups on file.   Orders:  No orders of the defined types were placed in this encounter.  No orders of the defined types were placed in this encounter.   Imaging: No results found.  PMFS History: Patient Active Problem List   Diagnosis Date Noted   Arthritis of knee 12/18/2022   Nonrheumatic mitral valve regurgitation s/p repair 2023    Peroneal tendinitis of right lower extremity 04/25/2021   Benign localized prostatic hyperplasia with lower urinary tract symptoms (LUTS) 09/27/2020   Wheezing 09/14/2020   Chondrocalcinosis 03/14/2020   Dyslipidemia 09/23/2017   Bilateral primary osteoarthritis of knee 09/02/2017   Neck pain 02/15/2017   Primary osteoarthritis of right hip 02/15/2017   Tear of right acetabular labrum 02/15/2017   Right leg pain 01/04/2017   Obese 08/17/2015   Elevated blood sugar 08/17/2015   Shingles 09/10/2014   Dyspnea 11/27/2011   HERNIA, UMBILICAL 08/02/2008   Essential hypertension 01/30/2008   Allergic rhinitis 01/30/2008   GERD 01/30/2008   HEADACHE 01/30/2008    Past Medical History:  Diagnosis Date   Allergy    Asthma    Coronary artery disease    GERD (gastroesophageal reflux disease)    Headache(784.0)    Hypertension     Family History  Problem Relation Age of Onset   Kidney disease Mother    COPD Father    Pneumonia Father    Cardiomyopathy Brother    Hypertension Sister    Heart disease Brother    Hypertension Brother    Hypertension Brother    Hypertension Sister    Diabetes Sister    Osteoarthritis Sister     Past Surgical History:  Procedure Laterality Date   CARDIAC CATHETERIZATION     HERNIA REPAIR  1969, 2009   kidney stones  05/20/1995   KNEE SURGERY  02/26/1989   MITRAL VALVE REPAIR  01/02/2022   NASAL SINUS SURGERY  10/24/1992   RIGHT/LEFT HEART CATH AND CORONARY ANGIOGRAPHY N/A 11/15/2021   Procedure: RIGHT/LEFT HEART CATH AND CORONARY ANGIOGRAPHY;  Surgeon: Orbie Pyo, MD;  Location: [facility];  Service: Cardiovascular;  Laterality: N/A;   ROTATOR CUFF REPAIR Left 04/25/1993   TEE WITHOUT CARDIOVERSION N/A 08/24/2021   Procedure: TRANSESOPHAGEAL ECHOCARDIOGRAM (TEE);  Surgeon: Jodelle Red, MD;  [facility] ENDOSCOPY;  Service: Cardiovascular;  Laterality: N/A;   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  Procedure: LEFT TOTAL KNEE ARTHROPLASTY;  Surgeon: Cammy Copa, MD;  [facility];  Service: Orthopedics;  Laterality: Left;   Social History   Occupational History   Not on file  Tobacco Use   Smoking status: Former   Smokeless tobacco: Never   Tobacco comments:    quit in 1972  Vaping Use   Vaping Use: Never used  Substance and Sexual Activity   Alcohol use: No   Drug use: No   Sexual activity: Not on file

## 2023-02-02 ENCOUNTER — Encounter: Payer: Self-pay | Admitting: Orthopedic Surgery

## 2023-02-05 ENCOUNTER — Encounter: Payer: Medicare Other | Admitting: Rehabilitative and Restorative Service Providers"

## 2023-02-07 ENCOUNTER — Encounter: Payer: Medicare Other | Admitting: Rehabilitative and Restorative Service Providers"

## 2023-02-11 ENCOUNTER — Ambulatory Visit: Payer: Medicare Other | Admitting: Surgical

## 2023-02-11 DIAGNOSIS — M1711 Unilateral primary osteoarthritis, right knee: Secondary | ICD-10-CM | POA: Diagnosis not present

## 2023-02-11 MED ORDER — HYDROCODONE-ACETAMINOPHEN 5-325 MG PO TABS: 1.0000 | ORAL_TABLET | Freq: Every day | ORAL | 0 refills | Status: DC | PRN

## 2023-02-12 ENCOUNTER — Encounter: Payer: Medicare Other | Admitting: Rehabilitative and Restorative Service Providers"

## 2023-02-14 ENCOUNTER — Encounter: Payer: Medicare Other | Admitting: Rehabilitative and Restorative Service Providers"

## 2023-02-16 ENCOUNTER — Encounter: Payer: Self-pay | Admitting: Surgical

## 2023-02-16 MED ORDER — LIDOCAINE HCL 1 % IJ SOLN
5.0000 mL | INTRAMUSCULAR | Status: AC | PRN
  Administered 2023-02-11: 5 mL

## 2023-02-16 MED ORDER — BUPIVACAINE HCL 0.25 % IJ SOLN
4.0000 mL | INTRAMUSCULAR | Status: AC | PRN
  Administered 2023-02-11: 4 mL via INTRA_ARTICULAR

## 2023-02-16 NOTE — Progress Notes (Signed)
Post-Op Visit Note   [name]           Date of Birth: Jun 01, 1951           MRN: [id number] Visit Date: 02/11/2023 PCP: Ardith Dark, MD   Assessment & Plan:  Chief Complaint:  Chief Complaint  Patient presents with   Right Knee - Pain   Visit Diagnoses: No diagnosis found.  Plan: Patient returns.  He is s/p left total knee arthroplasty.  Doing well in the recovery from his left knee replacement and he is very satisfied with how physical therapy is going.  Does not feel like he has plateaued yet.  He is looking forward to having the right knee done hopefully around June 2024.  He would like to have the right knee aspirated today.  Right knee was aspirated of about 30 cc of nonpurulent synovial fluid and Toradol injection was administered consisting of 4 cc of bupivacaine mixed with 1 cc of Toradol.  Norco refilled.  Follow-up with his planned follow-up for Dr. August Saucer and decide when to proceed with right knee replacement at that time.   Procedure Note  [name]             Date of Birth: 05/04/1951           MRN: [id number]             Visit Date: 02/11/2023  Procedures: Visit Diagnoses: No diagnosis found.  Large Joint Inj: R knee on 02/11/2023 12:57 PM Indications: diagnostic evaluation, joint swelling and pain Details: 18 G 1.5 in needle, superolateral approach  Arthrogram: No  Medications: 5 mL lidocaine 1 %; 4 mL bupivacaine 0.25 % (4 cc of bupivacaine mixed with 1 cc Toradol) Aspirate: 30 mL Outcome: tolerated well, no immediate complications Procedure, treatment alternatives, risks and benefits explained, specific risks discussed. Consent was given by the patient. Immediately prior to procedure a time out was called to verify the correct patient, procedure, equipment, support staff and site/side marked as required. Patient was prepped and draped in the usual sterile fashion.         Follow-Up Instructions: No follow-ups on file.    Orders:  No orders of the defined types were placed in this encounter.  Meds ordered this encounter  Medications   HYDROcodone-acetaminophen (NORCO/VICODIN) 5-325 MG tablet    Sig: Take 1 tablet by mouth daily as needed for moderate pain.    Dispense:  14 tablet    Refill:  0    Imaging: No results found.  PMFS History: Patient Active Problem List   Diagnosis Date Noted   Arthritis of knee 12/18/2022   Nonrheumatic mitral valve regurgitation s/p repair 2023    Peroneal tendinitis of right lower extremity 04/25/2021   Benign localized prostatic hyperplasia with lower urinary tract symptoms (LUTS) 09/27/2020   Wheezing 09/14/2020   Chondrocalcinosis 03/14/2020   Dyslipidemia 09/23/2017   Bilateral primary osteoarthritis of knee 09/02/2017   Neck pain 02/15/2017   Primary osteoarthritis of right hip 02/15/2017   Tear of right acetabular labrum 02/15/2017   Right leg pain 01/04/2017   Obese 08/17/2015   Elevated blood sugar 08/17/2015   Shingles 09/10/2014   Dyspnea 11/27/2011   HERNIA, UMBILICAL 08/02/2008   Essential hypertension 01/30/2008   Allergic rhinitis 01/30/2008   GERD 01/30/2008   HEADACHE 01/30/2008   Past Medical History:  Diagnosis Date   Allergy    Asthma    Coronary artery disease  GERD (gastroesophageal reflux disease)    Headache(784.0)    Hypertension     Family History  Problem Relation Age of Onset   Kidney disease Mother    COPD Father    Pneumonia Father    Cardiomyopathy Brother    Hypertension Sister    Heart disease Brother    Hypertension Brother    Hypertension Brother    Hypertension Sister    Diabetes Sister    Osteoarthritis Sister     Past Surgical History:  Procedure Laterality Date   CARDIAC CATHETERIZATION     HERNIA REPAIR  1969, 2009   kidney stones  05/20/1995   KNEE SURGERY  02/26/1989   MITRAL VALVE REPAIR  01/02/2022   NASAL SINUS SURGERY  10/24/1992   RIGHT/LEFT HEART CATH AND CORONARY ANGIOGRAPHY N/A  11/15/2021   Procedure: RIGHT/LEFT HEART CATH AND CORONARY ANGIOGRAPHY;  Surgeon: Orbie Pyo, MD;  Location: [facility] INVASIVE CV LAB;  Service: Cardiovascular;  Laterality: N/A;   ROTATOR CUFF REPAIR Left 04/25/1993   TEE WITHOUT CARDIOVERSION N/A 08/24/2021   Procedure: TRANSESOPHAGEAL ECHOCARDIOGRAM (TEE);  Surgeon: Jodelle Red, MD;  Location[facility] ENDOSCOPY;  Service: Cardiovascular;  Laterality: N/A;   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   Procedure: LEFT TOTAL KNEE ARTHROPLASTY;  Surgeon: Cammy Copa, MD;  Location: [facility] OR;  Service: Orthopedics;  Laterality: Left;   Social History   Occupational History   Not on file  Tobacco Use   Smoking status: Former   Smokeless tobacco: Never   Tobacco comments:    quit in 1972  Vaping Use   Vaping Use: Never used  Substance and Sexual Activity   Alcohol use: No   Drug use: No   Sexual activity: Not on file

## 2023-02-18 ENCOUNTER — Encounter: Payer: Medicare Other | Admitting: Rehabilitative and Restorative Service Providers"

## 2023-02-19 ENCOUNTER — Encounter: Payer: Medicare Other | Admitting: Rehabilitative and Restorative Service Providers"

## 2023-03-09 ENCOUNTER — Other Ambulatory Visit: Payer: Self-pay | Admitting: Family Medicine

## 2023-03-11 NOTE — Telephone Encounter (Signed)
Last OV 09/19/22 Next OV not scheduled  Last refill 01/08/23 QTY #30/1

## 2023-03-23 ENCOUNTER — Other Ambulatory Visit: Payer: Self-pay | Admitting: Family Medicine

## 2023-03-27 ENCOUNTER — Encounter: Payer: Self-pay | Admitting: Orthopedic Surgery

## 2023-03-27 ENCOUNTER — Ambulatory Visit: Payer: Medicare Other | Admitting: Orthopedic Surgery

## 2023-03-27 ENCOUNTER — Telehealth: Payer: Self-pay | Admitting: *Deleted

## 2023-03-27 DIAGNOSIS — M1711 Unilateral primary osteoarthritis, right knee: Secondary | ICD-10-CM

## 2023-03-27 MED ORDER — TRAMADOL HCL 50 MG PO TABS: 50.0000 mg | ORAL_TABLET | Freq: Three times a day (TID) | ORAL | 0 refills | Status: DC | PRN

## 2023-03-27 NOTE — Telephone Encounter (Signed)
[facility] RNCM met with patient and his wife at his 104 day post op appointment for his Left total knee replacement done by Dr. August Saucer. He would like to now proceed with the Right total knee replacement. Surgery date scheduled for 04/11/23 by scheduler today. Patient is an Ortho bundle and agreeable to case management. Will order CPM through [employer]. Has other DME needed post surgery. Will arrange HHPT for patient as before. Will call again prior to surgery and refresh post op instructions.

## 2023-03-27 NOTE — Progress Notes (Signed)
Office Visit Note   [name]           Date of Birth: [birth year]           MRN: [id number] Visit Date: 03/27/2023 Requested by: Ardith Dark, MD [address] [place],  [place] [address] PCP: Ardith Dark, MD  Subjective: Chief Complaint  Patient presents with   Left Knee - Routine Post Op    L TKA (surgery date 12-18-22)    HPI: Frank [name] is a 72 y.o. male who presents to the office reporting right knee pain.  He has known history of right knee arthritis.  Reports increased weakness and giving way along with locking and stiffness.  Would like to get scheduled for right total knee replacement.  Patient is doing well from his left total knee replacement performed over 3 months ago.  Ambulating with a cane.  He has finished physical therapy.  Taking Tylenol and ibuprofen for his left-sided symptoms..                ROS: All systems reviewed are negative as they relate to the chief complaint within the history of present illness.  Patient denies fevers or chills.  Assessment & Plan: Visit Diagnoses:  1. Unilateral primary osteoarthritis, right knee     Plan: Impression is end-stage arthritis right knee.  Plan is right total knee replacement.  Right knee has more deformity than the left knee.  May require some level of constraint.  Cemented knee replacement indicated.  Tramadol prescribed.  Follow-up 2 weeks after his knee replacement.  Risk and benefits are again discussed including not limited to infection or vessel damage incomplete pain relief as well as knee stiffness.  Patient understands and wishes to proceed.  Follow-Up Instructions: No follow-ups on file.   Orders:  No orders of the defined types were placed in this encounter.  No orders of the defined types were placed in this encounter.     Procedures: No procedures performed   Clinical Data: No additional findings.  Objective: Vital Signs: There were no vitals taken for this  visit.  Physical Exam:  Constitutional: Patient appears well-developed HEENT:  Head: Normocephalic Eyes:EOM are normal Neck: Normal range of motion Cardiovascular: Normal rate Pulmonary/chest: Effort normal Neurologic: Patient is alert Skin: Skin is warm Psychiatric: Patient has normal mood and affect  Ortho Exam: Ortho exam demonstrates range of motion on the left of 0-1 10.  Range of motion on the right also 0-1 10 but with varus deformity.  Pedal pulses palpable.  Ankle dorsiflexion intact.  Specialty Comments:  No specialty comments available.  Imaging: No results found.   PMFS History: Patient Active Problem List   Diagnosis Date Noted   Arthritis of knee 12/18/2022   Nonrheumatic mitral valve regurgitation s/p repair 2023    Peroneal tendinitis of right lower extremity 04/25/2021   Benign localized prostatic hyperplasia with lower urinary tract symptoms (LUTS) 09/27/2020   Wheezing 09/14/2020   Chondrocalcinosis 03/14/2020   Dyslipidemia 09/23/2017   Bilateral primary osteoarthritis of knee 09/02/2017   Neck pain 02/15/2017   Primary osteoarthritis of right hip 02/15/2017   Tear of right acetabular labrum 02/15/2017   Right leg pain 01/04/2017   Obese 08/17/2015   Elevated blood sugar 08/17/2015   Shingles 09/10/2014   Dyspnea 11/27/2011   HERNIA, UMBILICAL 08/02/2008   Essential hypertension 01/30/2008   Allergic rhinitis 01/30/2008   GERD 01/30/2008   HEADACHE 01/30/2008   Past Medical  History:  Diagnosis Date   Allergy    Asthma    Coronary artery disease    GERD (gastroesophageal reflux disease)    Headache(784.0)    Hypertension     Family History  Problem Relation Age of Onset   Kidney disease Mother    COPD Father    Pneumonia Father    Cardiomyopathy Brother    Hypertension Sister    Heart disease Brother    Hypertension Brother    Hypertension Brother    Hypertension Sister    Diabetes Sister    Osteoarthritis Sister     Past  Surgical History:  Procedure Laterality Date   CARDIAC CATHETERIZATION     HERNIA REPAIR  1969, 2009   kidney stones  05/20/1995   KNEE SURGERY  02/26/1989   MITRAL VALVE REPAIR  01/02/2022   NASAL SINUS SURGERY  10/24/1992   RIGHT/LEFT HEART CATH AND CORONARY ANGIOGRAPHY N/A 11/15/2021   Procedure: RIGHT/LEFT HEART CATH AND CORONARY ANGIOGRAPHY;  Surgeon: Orbie Pyo, MD;  Location: [facility] INVASIVE CV LAB;  Service: Cardiovascular;  Laterality: N/A;   ROTATOR CUFF REPAIR Left 04/25/1993   TEE WITHOUT CARDIOVERSION N/A 08/24/2021   Procedure: TRANSESOPHAGEAL ECHOCARDIOGRAM (TEE);  Surgeon: Jodelle Red, MD;  Location: [facility] ENDOSCOPY;  Service: Cardiovascular;  Laterality: N/A;   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   Procedure: LEFT TOTAL KNEE ARTHROPLASTY;  Surgeon: Cammy Copa, MD;  Location: [facility] OR;  Service: Orthopedics;  Laterality: Left;   Social History   Occupational History   Not on file  Tobacco Use   Smoking status: Former   Smokeless tobacco: Never   Tobacco comments:    quit in 1972  Vaping Use   Vaping Use: Never used  Substance and Sexual Activity   Alcohol use: No   Drug use: No   Sexual activity: Not on file

## 2023-03-28 ENCOUNTER — Other Ambulatory Visit: Payer: Self-pay

## 2023-04-02 NOTE — Progress Notes (Signed)
     Your surgery and Pre-Admission testing visit will be at [facility] located at [address], [place], [place] [address].  Please let all your doctors (i.e., Primary Care Physician, Cardiologist, Endocrinologist, Pulmonologist) know you are having surgery. You may need clearance for surgery. If you are on blood thinners, notify your surgeon and ask the doctor who prescribed them how long to hold them before surgery.  If you have had a heart test, such as an EKG, stress test, heart ultrasound, etc., or lab work performed outside of , please bring copies of these tests to your Pre-Admission testing, if possible.  These departments may contact you before the day of surgery:  Pre-Service Center - insurance/ billing: [phone] Pharmacy- to review your medications: [phone] Pre-Admission Testing- to set an appointment for your visit: [phone]  (Often, these numbers show up as "SPAM" on your phone)  The Pre-Admission Testing (PAT) visit focuses on Anesthesia for your upcoming surgery.  You do NOT need to fast; take your medications as usual. Please arrive 30 minutes early to allow for parking and admitting.  The visit may last up to an hour. Bring a photo ID and medical insurance card. Reschedule if you are sick. ([phone]) and please, NO children under age 16 at the visit.  During the PAT visit:  We will review your medical and surgical history.   You will receive pre-operative instructions, including the time of arrival at the hospital and surgical start time.  We will review what medication(s) you can take on the day of surgery.  After speaking with the nurse, you will have blood drawn and, if needed, a chest x-ray and EKG.  Most lab results from your doctor are good for 30 days, Hemoglobin A1C is good for 60 days. If you cannot talk to the Pharmacy, bring your medications or a list of them to the PST visit.   Infection control for the [facility] requires: All fingernail and toenail products should be removed before the day of surgery.  (SNS, Acrylic, Gel, Polish, Stickers, Press on, and Poly gel nails.)   Parking information:  Address: [facility] - [address], , [place] [address]  Please look for signs for entrance A off of [address]. Free valet parking is available Monday-Friday 05:30am-06:00pm     

## 2023-04-03 NOTE — Pre-Procedure Instructions (Signed)
Surgical Instructions    Your procedure is scheduled on Thursday, June 13th.  Report to [facility] Main Entrance "A" at 12:45 P.M., then check in with the Admitting office.  Call this number if you have problems the morning of surgery:  [phone]  If you have any questions prior to your surgery date call [phone]: Open Monday-Friday 8am-4pm If you experience any cold or flu symptoms such as cough, fever, chills, shortness of breath, etc. between now and your scheduled surgery, please notify us at the above number.     Remember:  Do not eat after midnight the night before your surgery  You may drink clear liquids until 11:45 AM the morning of your surgery.   Clear liquids allowed are: Water, Non-Citrus Juices (without pulp), Carbonated Beverages, Clear Tea, Black Coffee Only (NO MILK, CREAM OR POWDERED CREAMER of any kind), and Gatorade.   Patient Instructions  The night before surgery:  No food after midnight. ONLY clear liquids after midnight  The day of surgery (if you do NOT have diabetes):  Drink ONE (1) Pre-Surgery Clear Ensure by 11:45 AM the morning of surgery. Drink in one sitting. Do not sip.  This drink was given to you during your hospital  pre-op appointment visit.  Nothing else to drink after completing the  Pre-Surgery Clear Ensure.          If you have questions, please contact your surgeon's office.     Take these medicines the morning of surgery with A SIP OF WATER  tamsulosin (FLOMAX)    If needed: acetaminophen (TYLENOL)  fluticasone (FLONASE)     Hold Aspirin 5-7 days prior to surgery.  As of today, STOP taking any Aleve, Naproxen, Ibuprofen, Motrin, Advil, Goody's, BC's, all herbal medications, fish oil, and all vitamins.                     Do NOT Smoke (Tobacco/Vaping) for 24 hours prior to your procedure.  If you use a CPAP at night, you may bring your mask/headgear for your overnight stay.   Contacts, glasses, piercing's, hearing  aid's, dentures or partials may not be worn into surgery, please bring cases for these belongings.    For patients admitted to the hospital, discharge time will be determined by your treatment team.   Patients discharged the day of surgery will not be allowed to drive home, and someone needs to stay with them for 24 hours.  SURGICAL WAITING ROOM VISITATION Patients having surgery or a procedure may have no more than 2 support people in the waiting area - these visitors may rotate.   Children under the age of 18 must have an adult with them who is not the patient. If the patient needs to stay at the hospital during part of their recovery, the visitor guidelines for inpatient rooms apply. Pre-op nurse will coordinate an appropriate time for 1 support person to accompany patient in pre-op.  This support person may not rotate.   Please refer to the [facility] website for the visitor guidelines for Inpatients (after your surgery is over and you are in a regular room).      Pre-operative 5 CHG Bath Instructions   You can play a key role in reducing the risk of infection after surgery. Your skin needs to be as free of germs as possible. You can reduce the number of germs on your skin by washing with CHG (chlorhexidine gluconate) soap before surgery. CHG is an antiseptic soap that  kills germs and continues to kill germs even after washing.   DO NOT use if you have an allergy to chlorhexidine/CHG or antibacterial soaps. If your skin becomes reddened or irritated, stop using the CHG and notify one of our RNs at [phone].   Please shower with the CHG soap starting 4 days before surgery using the following schedule:     Please keep in mind the following:  DO NOT shave, including legs and underarms, starting the day of your first shower.   You may shave your face at any point before/day of surgery.  Place clean sheets on your bed the day you start using CHG soap. Use a clean washcloth (not used  since being washed) for each shower. DO NOT sleep with pets once you start using the CHG.   CHG Shower Instructions:  If you choose to wash your hair and private area, wash first with your normal shampoo/soap.  After you use shampoo/soap, rinse your hair and body thoroughly to remove shampoo/soap residue.  Turn the water OFF and apply about 3 tablespoons (45 ml) of CHG soap to a CLEAN washcloth.  Apply CHG soap ONLY FROM YOUR NECK DOWN TO YOUR TOES (washing for 3-5 minutes)  DO NOT use CHG soap on face, private areas, open wounds, or sores.  Pay special attention to the area where your surgery is being performed.  If you are having back surgery, having someone wash your back for you may be helpful. Wait 2 minutes after CHG soap is applied, then you may rinse off the CHG soap.  Pat dry with a clean towel  Put on clean clothes/pajamas   If you choose to wear lotion, please use ONLY the CHG-compatible lotions on the back of this paper.     Additional instructions for the day of surgery: DO NOT APPLY any lotions, deodorants, cologne, or perfumes.   Put on clean/comfortable clothes.  Brush your teeth.  Ask your nurse before applying any prescription medications to the skin. Do not wear jewelry  Do not bring valuables to the hospital. [facility] is not responsible for any belongings or valuables.    CHG Compatible Lotions   Aveeno Moisturizing lotion  Cetaphil Moisturizing Cream  Cetaphil Moisturizing Lotion  Clairol Herbal Essence Moisturizing Lotion, Dry Skin  Clairol Herbal Essence Moisturizing Lotion, Extra Dry Skin  Clairol Herbal Essence Moisturizing Lotion, Normal Skin  Curel Age Defying Therapeutic Moisturizing Lotion with Alpha Hydroxy  Curel Extreme Care Body Lotion  Curel Soothing Hands Moisturizing Hand Lotion  Curel Therapeutic Moisturizing Cream, Fragrance-Free  Curel Therapeutic Moisturizing Lotion, Fragrance-Free  Curel Therapeutic Moisturizing Lotion, Original  Formula  Eucerin Daily Replenishing Lotion  Eucerin Dry Skin Therapy Plus Alpha Hydroxy Crme  Eucerin Dry Skin Therapy Plus Alpha Hydroxy Lotion  Eucerin Original Crme  Eucerin Original Lotion  Eucerin Plus Crme Eucerin Plus Lotion  Eucerin TriLipid Replenishing Lotion  Keri Anti-Bacterial Hand Lotion  Keri Deep Conditioning Original Lotion Dry Skin Formula Softly Scented  Keri Deep Conditioning Original Lotion, Fragrance Free Sensitive Skin Formula  Keri Lotion Fast Absorbing Fragrance Free Sensitive Skin Formula  Keri Lotion Fast Absorbing Softly Scented Dry Skin Formula  Keri Original Lotion  Keri Skin Renewal Lotion Keri Silky Smooth Lotion  Keri Silky Smooth Sensitive Skin Lotion  Nivea Body Creamy Conditioning Oil  Nivea Body Extra Enriched Teacher, adult education Moisturizing Lotion Nivea Crme  Nivea Skin Firming Lotion  NutraDerm 30 Skin Lotion  NutraDerm Skin  Lotion  NutraDerm Therapeutic Skin Cream  NutraDerm Therapeutic Skin Lotion  ProShield Protective Hand Cream  Provon moisturizing lotion       Please read over the following fact sheets that you were given.    If you received a COVID test during your pre-op visit  it is requested that you wear a mask when out in public, stay away from anyone that may not be feeling well and notify your surgeon if you develop symptoms. If you have been in contact with anyone that has tested positive in the last 10 days please notify you surgeon.

## 2023-04-04 ENCOUNTER — Encounter (HOSPITAL_COMMUNITY): Payer: Self-pay

## 2023-04-04 ENCOUNTER — Other Ambulatory Visit: Payer: Self-pay

## 2023-04-04 ENCOUNTER — Encounter (HOSPITAL_COMMUNITY)
Admission: RE | Admit: 2023-04-04 | Discharge: 2023-04-04 | Disposition: A | Discharge status: Home / Self Care | Payer: Medicare Other | Source: Ambulatory Visit | Attending: Orthopedic Surgery | Admitting: Orthopedic Surgery

## 2023-04-04 VITALS — BP 150/86 | HR 71 | Temp 98.4°F | Resp 18 | Ht 62.0 in | Wt 182.1 lb

## 2023-04-04 DIAGNOSIS — Z01818 Encounter for other preprocedural examination: Secondary | ICD-10-CM | POA: Insufficient documentation

## 2023-04-04 HISTORY — DX: Osteoarthritis of knee, unspecified: M17.9

## 2023-04-04 HISTORY — DX: Personal history of urinary calculi: Z87.442

## 2023-04-04 LAB — CBC
HCT: 44.1 % (ref 39.0–52.0)
Hemoglobin: 14.4 g/dL (ref 13.0–17.0)
MCH: 30.5 pg (ref 26.0–34.0)
MCHC: 32.7 g/dL (ref 30.0–36.0)
MCV: 93.4 fL (ref 80.0–100.0)
Platelets: 235 10*3/uL (ref 150–400)
RBC: 4.72 MIL/uL (ref 4.22–5.81)
RDW: 13.4 % (ref 11.5–15.5)
WBC: 6.5 10*3/uL (ref 4.0–10.5)
nRBC: 0 % (ref 0.0–0.2)

## 2023-04-04 LAB — BASIC METABOLIC PANEL
Anion gap: 9 (ref 5–15)
BUN: 21 mg/dL (ref 8–23)
CO2: 27 mmol/L (ref 22–32)
Calcium: 9.2 mg/dL (ref 8.9–10.3)
Chloride: 106 mmol/L (ref 98–111)
Creatinine, Ser: 0.85 mg/dL (ref 0.61–1.24)
GFR, Estimated: >60 mL/min (ref >60)
Glucose, Bld: 97 mg/dL (ref 70–99)
Potassium: 3.9 mmol/L (ref 3.5–5.1)
Sodium: 142 mmol/L (ref 135–145)

## 2023-04-04 LAB — URINALYSIS, W/ REFLEX TO CULTURE (INFECTION SUSPECTED)
Bacteria, UA: NONE SEEN
Bilirubin Urine: NEGATIVE
Glucose, UA: NEGATIVE mg/dL
Hgb urine dipstick: NEGATIVE
Ketones, ur: NEGATIVE mg/dL
Leukocytes,Ua: NEGATIVE
Nitrite: NEGATIVE
Protein, ur: NEGATIVE mg/dL
Specific Gravity, Urine: 1.01 (ref 1.005–1.030)
pH: 6 (ref 5.0–8.0)

## 2023-04-04 LAB — SURGICAL PCR SCREEN
MRSA, PCR: NEGATIVE
Staphylococcus aureus: NEGATIVE

## 2023-04-04 NOTE — Progress Notes (Signed)
PCP - Dr. Jacquiline Doe Cardiologist - Dr. Alverda Skeans  PPM/ICD - denies   Chest x-ray - 01/22/22 EKG - 05/24/22 Stress Test - 12/05/11 ECHO - 03/07/22 Cardiac Cath - 11/15/21  Sleep Study - denies   DM- denies  Blood Thinner Instructions: n/a Aspirin Instructions: Hold 5-7 days  ERAS Protcol - yes PRE-SURGERY Ensure given at PAT  COVID TEST- n/a   Anesthesia review: yes, cardiac hx  Patient denies shortness of breath, fever, cough and chest pain at PAT appointment   All instructions explained to the patient, with a verbal understanding of the material. Patient agrees to go over the instructions while at home for a better understanding.  The opportunity to ask questions was provided.

## 2023-04-05 NOTE — Progress Notes (Signed)
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 Most recent echo 02/2022 with EF of 50-55% and mild concentric LVH with mild MV regurg and functional mild stenosis.   Lasts visit with cardiology was on 10/05/22 with Robin Searing, NP.  He denied any chest pain or dizziness. RLE swelling improved, so Lasix and KCL dosing adjusted. He encouraged continued use compression stockings.  Patient was subsequently cleared for left TKA per televisit 12/13/2022 by Micah Flesher, PA-C stating, "He does not have a history of ischemic heart disease or stroke. According to the RCRI, he is at low risk to proceed with surgery (0.4% risk of MACE). We have good data regarding CAD and valvular function. He understands this risk ans wishes to proceed.  Given no history of PCI and plan for spinal anesthesia, he may hold ASA 5-7 days prior to surgery. Therefore, based on ACC/AHA guidelines, the patient would be at acceptable risk for the planned procedure without further cardiovascular testing."   Patient did undergo left TKA 12/18/2022 without complication.   Preop labs reviewed, WNL.  EKG 05/24/2022: Sinus rhythm with Premature atrial complexes. Rate 98.  Echo 03/07/22: IMPRESSIONS   1. Left ventricular ejection fraction, by estimation, is 50 to 55%. The  left ventricle has low normal function. The left ventricle has no regional  wall motion abnormalities. There is mild concentric left ventricular  hypertrophy. Left ventricular  diastolic function could not be evaluated.   2. Right ventricular systolic function is normal. The right ventricular  size is normal. There is normal pulmonary artery systolic pressure.   3. The mitral valve 26 Simulus ring is well seated. The mitral valve has  been repaired. Mild mitral valve  regurgitation. Functional mild mitral  stenosis. The mean mitral valve gradient is 6.0 mmHg. There is a present  in the mitral position. Procedure  Date: 01/22/2022.   4. The aortic valve is tricuspid. Aortic valve regurgitation is not  visualized. No aortic stenosis is present.   5. The inferior vena cava is normal in size with greater than 50%  respiratory variability, suggesting right atrial pressure of 3 mmHg.   6. There is borderline dilatation of the aortic root, measuring 36 mm.    Cardiac cath 11/15/21 (pre-MV Repair):   LV end diastolic pressure is mildly elevated.   Hemodynamic findings consistent with mild pulmonary hypertension.   There is moderate (3+) mitral regurgitation.   1.  Left dominant circulation with no significant obstructive coronary artery disease. 2.  Preserved cardiac output of 5.8 L/min and cardiac index of 3.15 L/min/m with mean RA pressure of 9 mmHg and mean wedge pressure of 18 mmHg; V waves were only 21 mmHg despite multiple wedge pressure assessments. 3.  At least moderate mitral regurgitation on ventriculography with preserved ejection fraction. - S/p MV repair using 36mm Simulus ring, Goretex artificial chord to P2, and Alfieri stitch via HP on 01/02/22    Antionette Poles, Cordelia Poche [facility] Short Stay Center/Anesthesiology Phone [phone] 04/05/2023 3:30 PM

## 2023-04-05 NOTE — Anesthesia Preprocedure Evaluation (Signed)
Anesthesia Evaluation  Patient identified by MRN, date of birth, ID band Patient awake    Reviewed: Allergy & Precautions, NPO status , Patient's Chart, lab work & pertinent test results  Airway Mallampati: II  TM Distance: >3 FB Neck ROM: Full    Dental  (+) Dental Advisory Given   Pulmonary asthma , former smoker   breath sounds clear to auscultation       Cardiovascular hypertension, Pt. on medications + CAD  + Valvular Problems/Murmurs (s/p MV repair)  Rhythm:Regular Rate:Normal     Neuro/Psych negative neurological ROS     GI/Hepatic Neg liver ROS,GERD  ,,  Endo/Other  negative endocrine ROS    Renal/GU negative Renal ROS     Musculoskeletal  (+) Arthritis ,    Abdominal   Peds  Hematology negative hematology ROS (+)   Anesthesia Other Findings   Reproductive/Obstetrics                              Lab Results  Component Value Date   WBC 6.5 04/04/2023   HGB 14.4 04/04/2023   HCT 44.1 04/04/2023   MCV 93.4 04/04/2023   PLT 235 04/04/2023   Lab Results  Component Value Date   CREATININE 0.85 04/04/2023   BUN 21 04/04/2023   NA 142 04/04/2023   K 3.9 04/04/2023   CL 106 04/04/2023   CO2 27 04/04/2023   Anesthesia Physical Anesthesia Plan  ASA: 2  Anesthesia Plan: Spinal   Post-op Pain Management: Tylenol PO (pre-op)* and Regional block*   Induction:   PONV Risk Score and Plan: 1 and Propofol infusion, Ondansetron, Dexamethasone and Treatment may vary due to age or medical condition  Airway Management Planned: Natural Airway and Simple Face Mask  Additional Equipment: None  Intra-op Plan:   Post-operative Plan:   Informed Consent: I have reviewed the patients History and Physical, chart, labs and discussed the procedure including the risks, benefits and alternatives for the proposed anesthesia with the patient or authorized representative who has indicated  his/her understanding and acceptance.       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 Most recent echo 02/2022 with EF of 50-55% and mild concentric LVH with mild MV regurg and functional mild stenosis.   Lasts visit with cardiology was on 10/05/22 with Robin Searing, NP.  He denied any chest pain or dizziness. RLE swelling improved, so Lasix and KCL dosing adjusted. He encouraged continued use compression stockings.  Patient was subsequently cleared for left TKA per televisit 12/13/2022 by Micah Flesher, PA-C stating, "He does not have a history of ischemic heart disease or stroke. According to the RCRI, he is at low risk to proceed with surgery (0.4% risk of MACE). We have good data regarding CAD and valvular function. He understands this risk ans wishes to proceed.  Given no history of PCI and plan for spinal anesthesia, he may hold ASA 5-7 days prior to surgery. Therefore, based on ACC/AHA guidelines, the patient would be at acceptable risk for the planned procedure without further cardiovascular testing."   Patient did undergo left TKA 12/18/2022 without complication.   Preop labs reviewed, WNL.  EKG 05/24/2022: Sinus rhythm with Premature atrial  complexes. Rate 98.  Echo 03/07/22: IMPRESSIONS   1. Left ventricular ejection fraction, by estimation, is 50 to 55%. The  left ventricle has low normal function. The left ventricle has no regional  wall motion abnormalities. There is mild concentric left ventricular  hypertrophy. Left ventricular  diastolic function could not be evaluated.   2. Right ventricular systolic function is normal. The right ventricular  size is normal. There is normal pulmonary artery systolic pressure.    3. The mitral valve 26 Simulus ring is well seated. The mitral valve has  been repaired. Mild mitral valve regurgitation. Functional mild mitral  stenosis. The mean mitral valve gradient is 6.0 mmHg. There is a present  in the mitral position. Procedure  Date: 01/22/2022.   4. The aortic valve is tricuspid. Aortic valve regurgitation is not  visualized. No aortic stenosis is present.   5. The inferior vena cava is normal in size with greater than 50%  respiratory variability, suggesting right atrial pressure of 3 mmHg.   6. There is borderline dilatation of the aortic root, measuring 36 mm.    Cardiac cath 11/15/21 (pre-MV Repair):   LV end diastolic pressure is mildly elevated.   Hemodynamic findings consistent with mild pulmonary hypertension.   There is moderate (3+) mitral regurgitation.   1.  Left dominant circulation with no significant obstructive coronary artery disease. 2.  Preserved cardiac output of 5.8 L/min and cardiac index of 3.15 L/min/m with mean RA pressure of 9 mmHg and mean wedge pressure of 18 mmHg; V waves were only 21 mmHg despite multiple wedge pressure assessments. 3.  At least moderate mitral regurgitation on ventriculography with preserved ejection fraction. - S/p MV repair using 36mm Simulus ring, Goretex artificial chord to P2, and Alfieri stitch via HP on 01/02/22  )         Anesthesia Quick Evaluation

## 2023-04-10 MED ORDER — TRANEXAMIC ACID 1000 MG/10ML IV SOLN
2000.0000 mg | INTRAVENOUS | Status: DC
  Filled 2023-04-10: qty 20

## 2023-04-11 ENCOUNTER — Ambulatory Visit (HOSPITAL_BASED_OUTPATIENT_CLINIC_OR_DEPARTMENT_OTHER): Payer: Medicare Other | Admitting: Anesthesiology

## 2023-04-11 ENCOUNTER — Other Ambulatory Visit: Payer: Self-pay

## 2023-04-11 ENCOUNTER — Encounter (HOSPITAL_COMMUNITY)
Admission: RE | Disposition: A | Discharge status: Home / Self Care | Payer: Self-pay | Source: Home / Self Care | Attending: Orthopedic Surgery

## 2023-04-11 ENCOUNTER — Ambulatory Visit (HOSPITAL_COMMUNITY): Payer: Medicare Other | Admitting: Physician Assistant

## 2023-04-11 ENCOUNTER — Observation Stay (HOSPITAL_COMMUNITY)
Admission: RE | Admit: 2023-04-11 | Discharge: 2023-04-12 | Disposition: A | Discharge status: Home / Self Care | Payer: Medicare Other | Attending: Orthopedic Surgery | Admitting: Orthopedic Surgery

## 2023-04-11 ENCOUNTER — Encounter (HOSPITAL_COMMUNITY): Payer: Self-pay | Admitting: Orthopedic Surgery

## 2023-04-11 DIAGNOSIS — Z79899 Other long term (current) drug therapy: Secondary | ICD-10-CM | POA: Diagnosis not present

## 2023-04-11 DIAGNOSIS — J45909 Unspecified asthma, uncomplicated: Secondary | ICD-10-CM | POA: Insufficient documentation

## 2023-04-11 DIAGNOSIS — Z01818 Encounter for other preprocedural examination: Secondary | ICD-10-CM

## 2023-04-11 DIAGNOSIS — M1711 Unilateral primary osteoarthritis, right knee: Secondary | ICD-10-CM

## 2023-04-11 DIAGNOSIS — I10 Essential (primary) hypertension: Secondary | ICD-10-CM | POA: Insufficient documentation

## 2023-04-11 DIAGNOSIS — I2510 Atherosclerotic heart disease of native coronary artery without angina pectoris: Secondary | ICD-10-CM | POA: Diagnosis not present

## 2023-04-11 DIAGNOSIS — Z96652 Presence of left artificial knee joint: Secondary | ICD-10-CM | POA: Insufficient documentation

## 2023-04-11 DIAGNOSIS — Z87891 Personal history of nicotine dependence: Secondary | ICD-10-CM | POA: Insufficient documentation

## 2023-04-11 DIAGNOSIS — G8918 Other acute postprocedural pain: Secondary | ICD-10-CM | POA: Diagnosis not present

## 2023-04-11 DIAGNOSIS — Z96651 Presence of right artificial knee joint: Secondary | ICD-10-CM

## 2023-04-11 HISTORY — PX: TOTAL KNEE ARTHROPLASTY: SHX125

## 2023-04-11 SURGERY — ARTHROPLASTY, KNEE, TOTAL
Anesthesia: Monitor Anesthesia Care | Site: Knee | Laterality: Right

## 2023-04-11 MED ORDER — VANCOMYCIN HCL IN DEXTROSE 1-5 GM/200ML-% IV SOLN
1000.0000 mg | Freq: Two times a day (BID) | INTRAVENOUS | Status: AC
  Filled 2023-04-11: qty 200

## 2023-04-11 MED ORDER — FENTANYL CITRATE (PF) 100 MCG/2ML IJ SOLN: 25.0000 ug | INTRAMUSCULAR | Status: DC | PRN

## 2023-04-11 MED ORDER — ACETAMINOPHEN 325 MG PO TABS: 325.0000 mg | ORAL_TABLET | Freq: Four times a day (QID) | ORAL | Status: DC | PRN

## 2023-04-11 MED ORDER — ORAL CARE MOUTH RINSE: 15.0000 mL | Freq: Once | OROMUCOSAL | Status: AC

## 2023-04-11 MED ORDER — ONDANSETRON HCL 4 MG/2ML IJ SOLN
INTRAMUSCULAR | Status: DC | PRN
  Administered 2023-04-11: 4 mg via INTRAVENOUS

## 2023-04-11 MED ORDER — PROPOFOL 500 MG/50ML IV EMUL
INTRAVENOUS | Status: DC | PRN
  Administered 2023-04-11: 100 ug/kg/min via INTRAVENOUS

## 2023-04-11 MED ORDER — VANCOMYCIN HCL 1000 MG IV SOLR
INTRAVENOUS | Status: DC | PRN
  Administered 2023-04-11: 1000 mg via TOPICAL

## 2023-04-11 MED ORDER — FENTANYL CITRATE (PF) 100 MCG/2ML IJ SOLN: 50.0000 ug | Freq: Once | INTRAMUSCULAR | Status: AC

## 2023-04-11 MED ORDER — SODIUM CHLORIDE 0.9 % IV SOLN: INTRAVENOUS | Status: AC

## 2023-04-11 MED ORDER — MORPHINE SULFATE (PF) 4 MG/ML IV SOLN
INTRAVENOUS | Status: AC
  Filled 2023-04-11: qty 2

## 2023-04-11 MED ORDER — METOCLOPRAMIDE HCL 5 MG PO TABS: 5.0000 mg | ORAL_TABLET | Freq: Three times a day (TID) | ORAL | Status: DC | PRN

## 2023-04-11 MED ORDER — CHLORHEXIDINE GLUCONATE 0.12 % MT SOLN
OROMUCOSAL | Status: AC
  Administered 2023-04-11: 15 mL via OROMUCOSAL
  Filled 2023-04-11: qty 15

## 2023-04-11 MED ORDER — ASPIRIN 81 MG PO CHEW
81.0000 mg | CHEWABLE_TABLET | Freq: Two times a day (BID) | ORAL | Status: DC
  Administered 2023-04-11 – 2023-04-12 (×2): 81 mg via ORAL
  Filled 2023-04-11 (×2): qty 1

## 2023-04-11 MED ORDER — MORPHINE SULFATE (PF) 4 MG/ML IV SOLN
INTRAVENOUS | Status: DC | PRN
  Administered 2023-04-11: 8 mg via INTRAMUSCULAR

## 2023-04-11 MED ORDER — SODIUM CHLORIDE 0.9 % IR SOLN: Status: DC | PRN

## 2023-04-11 MED ORDER — TRANEXAMIC ACID 1000 MG/10ML IV SOLN
INTRAVENOUS | Status: DC | PRN
  Administered 2023-04-11: 2000 mg via TOPICAL

## 2023-04-11 MED ORDER — BUPIVACAINE HCL (PF) 0.25 % IJ SOLN
INTRAMUSCULAR | Status: AC
  Filled 2023-04-11: qty 30

## 2023-04-11 MED ORDER — ONDANSETRON HCL 4 MG/2ML IJ SOLN: 4.0000 mg | Freq: Four times a day (QID) | INTRAMUSCULAR | Status: DC | PRN

## 2023-04-11 MED ORDER — HYDROMORPHONE HCL 1 MG/ML IJ SOLN: 0.5000 mg | INTRAMUSCULAR | Status: DC | PRN

## 2023-04-11 MED ORDER — ROPIVACAINE HCL 5 MG/ML IJ SOLN
INTRAMUSCULAR | Status: DC | PRN
  Administered 2023-04-11: 20 mL via PERINEURAL

## 2023-04-11 MED ORDER — ACETAMINOPHEN 500 MG PO TABS
1000.0000 mg | ORAL_TABLET | Freq: Once | ORAL | Status: AC
  Administered 2023-04-11: 1000 mg via ORAL
  Filled 2023-04-11: qty 2

## 2023-04-11 MED ORDER — CLONIDINE HCL (ANALGESIA) 100 MCG/ML EP SOLN
EPIDURAL | Status: DC | PRN
  Administered 2023-04-11: 1 mL via INTRA_ARTICULAR

## 2023-04-11 MED ORDER — METOCLOPRAMIDE HCL 5 MG/ML IJ SOLN: 5.0000 mg | Freq: Three times a day (TID) | INTRAMUSCULAR | Status: DC | PRN

## 2023-04-11 MED ORDER — MIDAZOLAM HCL 2 MG/2ML IJ SOLN
INTRAMUSCULAR | Status: AC
  Filled 2023-04-11: qty 2

## 2023-04-11 MED ORDER — MIDAZOLAM HCL 2 MG/2ML IJ SOLN
INTRAMUSCULAR | Status: DC | PRN
  Administered 2023-04-11: 1 mg via INTRAVENOUS

## 2023-04-11 MED ORDER — ACETAMINOPHEN 500 MG PO TABS
1000.0000 mg | ORAL_TABLET | Freq: Four times a day (QID) | ORAL | Status: AC
  Administered 2023-04-12 (×4): 1000 mg via ORAL
  Filled 2023-04-11 (×4): qty 2

## 2023-04-11 MED ORDER — DEXAMETHASONE SODIUM PHOSPHATE 10 MG/ML IJ SOLN
INTRAMUSCULAR | Status: DC | PRN
  Administered 2023-04-11: 5 mg via INTRAVENOUS

## 2023-04-11 MED ORDER — PHENOL 1.4 % MT LIQD: 1.0000 | OROMUCOSAL | Status: DC | PRN

## 2023-04-11 MED ORDER — POVIDONE-IODINE 10 % EX SWAB
2.0000 | Freq: Once | CUTANEOUS | Status: AC
  Administered 2023-04-11: 2 via TOPICAL

## 2023-04-11 MED ORDER — BUPIVACAINE HCL 0.25 % IJ SOLN
INTRAMUSCULAR | Status: DC | PRN
  Administered 2023-04-11: 20 mL
  Administered 2023-04-11: 10 mL

## 2023-04-11 MED ORDER — POVIDONE-IODINE 7.5 % EX SOLN
Freq: Once | CUTANEOUS | Status: AC
  Filled 2023-04-11: qty 118

## 2023-04-11 MED ORDER — VASOPRESSIN 20 UNIT/ML IV SOLN
INTRAVENOUS | Status: DC | PRN
  Administered 2023-04-11: 2 [IU] via INTRAVENOUS
  Administered 2023-04-11: 1 [IU] via INTRAVENOUS

## 2023-04-11 MED ORDER — VANCOMYCIN HCL 1000 MG IV SOLR
INTRAVENOUS | Status: AC
  Filled 2023-04-11: qty 20

## 2023-04-11 MED ORDER — CLONIDINE HCL (ANALGESIA) 100 MCG/ML EP SOLN
EPIDURAL | Status: DC | PRN
  Administered 2023-04-11: 50 ug

## 2023-04-11 MED ORDER — LEVOFLOXACIN IN D5W 500 MG/100ML IV SOLN
500.0000 mg | Freq: Once | INTRAVENOUS | Status: AC
  Administered 2023-04-12: 500 mg via INTRAVENOUS
  Filled 2023-04-11: qty 100

## 2023-04-11 MED ORDER — POTASSIUM CHLORIDE CRYS ER 20 MEQ PO TBCR
20.0000 meq | EXTENDED_RELEASE_TABLET | Freq: Every day | ORAL | Status: DC
  Administered 2023-04-11 – 2023-04-12 (×2): 20 meq via ORAL
  Filled 2023-04-11 (×3): qty 1

## 2023-04-11 MED ORDER — ALBUMIN HUMAN 5 % IV SOLN: INTRAVENOUS | Status: DC | PRN

## 2023-04-11 MED ORDER — METHOCARBAMOL 500 MG PO TABS
500.0000 mg | ORAL_TABLET | Freq: Four times a day (QID) | ORAL | Status: DC | PRN
  Administered 2023-04-12 (×2): 500 mg via ORAL
  Filled 2023-04-11 (×2): qty 1

## 2023-04-11 MED ORDER — EPHEDRINE SULFATE-NACL 50-0.9 MG/10ML-% IV SOSY
PREFILLED_SYRINGE | INTRAVENOUS | Status: DC | PRN
  Administered 2023-04-11: 10 mg via INTRAVENOUS
  Administered 2023-04-11 (×3): 5 mg via INTRAVENOUS

## 2023-04-11 MED ORDER — 0.9 % SODIUM CHLORIDE (POUR BTL) OPTIME
TOPICAL | Status: DC | PRN
  Administered 2023-04-11 (×5): 1000 mL

## 2023-04-11 MED ORDER — FENTANYL CITRATE (PF) 250 MCG/5ML IJ SOLN
INTRAMUSCULAR | Status: DC | PRN
  Administered 2023-04-11: 25 ug via INTRAVENOUS

## 2023-04-11 MED ORDER — SODIUM CHLORIDE 0.9 % IR SOLN
Status: DC | PRN
  Administered 2023-04-11: 3000 mL

## 2023-04-11 MED ORDER — OXYCODONE HCL 5 MG PO TABS
5.0000 mg | ORAL_TABLET | ORAL | Status: DC | PRN
  Administered 2023-04-11 – 2023-04-12 (×2): 5 mg via ORAL
  Administered 2023-04-12: 10 mg via ORAL
  Administered 2023-04-12: 5 mg via ORAL
  Filled 2023-04-11: qty 1
  Filled 2023-04-11: qty 2
  Filled 2023-04-11 (×2): qty 1

## 2023-04-11 MED ORDER — TRANEXAMIC ACID-NACL 1000-0.7 MG/100ML-% IV SOLN
1000.0000 mg | INTRAVENOUS | Status: AC
  Administered 2023-04-11: 1000 mg via INTRAVENOUS
  Filled 2023-04-11: qty 100

## 2023-04-11 MED ORDER — FUROSEMIDE 40 MG PO TABS
40.0000 mg | ORAL_TABLET | Freq: Two times a day (BID) | ORAL | Status: DC
  Administered 2023-04-11 – 2023-04-12 (×2): 40 mg via ORAL
  Filled 2023-04-11 (×3): qty 1

## 2023-04-11 MED ORDER — DIPHENHYDRAMINE HCL 50 MG/ML IJ SOLN
INTRAMUSCULAR | Status: DC | PRN
  Administered 2023-04-11: 12.5 mg via INTRAVENOUS

## 2023-04-11 MED ORDER — BUPIVACAINE LIPOSOME 1.3 % IJ SUSP
INTRAMUSCULAR | Status: AC
  Filled 2023-04-11: qty 20

## 2023-04-11 MED ORDER — PHENYLEPHRINE 80 MCG/ML (10ML) SYRINGE FOR IV PUSH (FOR BLOOD PRESSURE SUPPORT)
PREFILLED_SYRINGE | INTRAVENOUS | Status: DC | PRN
  Administered 2023-04-11 (×4): 80 ug via INTRAVENOUS

## 2023-04-11 MED ORDER — CHLORHEXIDINE GLUCONATE 0.12 % MT SOLN: 15.0000 mL | Freq: Once | OROMUCOSAL | Status: AC

## 2023-04-11 MED ORDER — FENTANYL CITRATE (PF) 100 MCG/2ML IJ SOLN
INTRAMUSCULAR | Status: AC
  Administered 2023-04-11: 50 ug via INTRAVENOUS
  Filled 2023-04-11: qty 2

## 2023-04-11 MED ORDER — TAMSULOSIN HCL 0.4 MG PO CAPS
0.4000 mg | ORAL_CAPSULE | Freq: Every day | ORAL | Status: DC
  Administered 2023-04-12: 0.4 mg via ORAL
  Filled 2023-04-11: qty 1

## 2023-04-11 MED ORDER — DIPHENHYDRAMINE HCL 50 MG/ML IJ SOLN
50.0000 mg | Freq: Four times a day (QID) | INTRAMUSCULAR | Status: DC | PRN
  Administered 2023-04-11: 50 mg via INTRAVENOUS
  Filled 2023-04-11: qty 1

## 2023-04-11 MED ORDER — ONDANSETRON HCL 4 MG PO TABS: 4.0000 mg | ORAL_TABLET | Freq: Four times a day (QID) | ORAL | Status: DC | PRN

## 2023-04-11 MED ORDER — MENTHOL 3 MG MT LOZG: 1.0000 | LOZENGE | OROMUCOSAL | Status: DC | PRN

## 2023-04-11 MED ORDER — AMISULPRIDE (ANTIEMETIC) 5 MG/2ML IV SOLN: 10.0000 mg | Freq: Once | INTRAVENOUS | Status: DC | PRN

## 2023-04-11 MED ORDER — PHENYLEPHRINE HCL-NACL 20-0.9 MG/250ML-% IV SOLN
INTRAVENOUS | Status: DC | PRN
  Administered 2023-04-11: 25 ug/min via INTRAVENOUS

## 2023-04-11 MED ORDER — BUPIVACAINE LIPOSOME 1.3 % IJ SUSP
INTRAMUSCULAR | Status: DC | PRN
  Administered 2023-04-11: 20 mL

## 2023-04-11 MED ORDER — VASOPRESSIN 20 UNIT/ML IV SOLN
INTRAVENOUS | Status: AC
  Filled 2023-04-11: qty 1

## 2023-04-11 MED ORDER — IRRISEPT - 450ML BOTTLE WITH 0.05% CHG IN STERILE WATER, USP 99.95% OPTIME
TOPICAL | Status: DC | PRN
  Administered 2023-04-11: 450 mL via TOPICAL

## 2023-04-11 MED ORDER — FENTANYL CITRATE (PF) 250 MCG/5ML IJ SOLN
INTRAMUSCULAR | Status: AC
  Filled 2023-04-11: qty 5

## 2023-04-11 MED ORDER — METHOCARBAMOL 1000 MG/10ML IJ SOLN: 500.0000 mg | Freq: Four times a day (QID) | INTRAVENOUS | Status: DC | PRN

## 2023-04-11 MED ORDER — LACTATED RINGERS IV SOLN: INTRAVENOUS | Status: DC

## 2023-04-11 MED ORDER — SODIUM CHLORIDE 0.9% FLUSH
INTRAVENOUS | Status: DC | PRN
  Administered 2023-04-11: 20 mL

## 2023-04-11 MED ORDER — POVIDONE-IODINE 10 % EX SWAB: 2.0000 | Freq: Once | CUTANEOUS | Status: DC

## 2023-04-11 MED ORDER — CEFAZOLIN SODIUM-DEXTROSE 2-4 GM/100ML-% IV SOLN
2.0000 g | INTRAVENOUS | Status: AC
  Administered 2023-04-11: 2 g via INTRAVENOUS
  Filled 2023-04-11: qty 100

## 2023-04-11 MED ORDER — DOCUSATE SODIUM 100 MG PO CAPS
100.0000 mg | ORAL_CAPSULE | Freq: Two times a day (BID) | ORAL | Status: DC
  Administered 2023-04-11 – 2023-04-12 (×2): 100 mg via ORAL
  Filled 2023-04-11 (×2): qty 1

## 2023-04-11 MED ORDER — CLONIDINE HCL (ANALGESIA) 100 MCG/ML EP SOLN
EPIDURAL | Status: AC
  Filled 2023-04-11: qty 10

## 2023-04-11 SURGICAL SUPPLY — 92 items
BAG COUNTER SPONGE SURGICOUNT (BAG) ×2 IMPLANT
BAG DECANTER FOR FLEXI CONT (MISCELLANEOUS) ×2 IMPLANT
BAG SPNG CNTER NS LX DISP (BAG) ×1
BANDAGE ESMARK 6X9 LF (GAUZE/BANDAGES/DRESSINGS) ×2 IMPLANT
BLADE SAG 18X100X1.27 (BLADE) ×2 IMPLANT
BLADE SAGITTAL (BLADE) ×1
BLADE SAW SGTL 11.0X1.19X90.0M (BLADE) IMPLANT
BLADE SAW THK.89X75X18XSGTL (BLADE) ×2 IMPLANT
BNDG CMPR 5X6 CHSV STRCH STRL (GAUZE/BANDAGES/DRESSINGS) ×1
BNDG CMPR 9X6 STRL LF SNTH (GAUZE/BANDAGES/DRESSINGS) ×1
BNDG CMPR MED 15X6 ELC VLCR LF (GAUZE/BANDAGES/DRESSINGS) ×1
BNDG COHESIVE 6X5 TAN ST LF (GAUZE/BANDAGES/DRESSINGS) ×2 IMPLANT
BNDG ELASTIC 6X15 VLCR STRL LF (GAUZE/BANDAGES/DRESSINGS) ×2 IMPLANT
BNDG ESMARK 6X9 LF (GAUZE/BANDAGES/DRESSINGS) ×1
BOWL SMART MIX CTS (DISPOSABLE) IMPLANT
BSPLAT TIB 5D E CMNT KN RT (Knees) ×1 IMPLANT
CEMENT BONE SIMPLEX SPEEDSET (Cement) IMPLANT
CNTNR URN SCR LID CUP LEK RST (MISCELLANEOUS) ×2 IMPLANT
COMP FEM CMT PERSONA SZ7 RT (Joint) ×1 IMPLANT
COMPONENT FEM CMT PRSONA SZ7RT (Joint) IMPLANT
CONT SPEC 4OZ STRL OR WHT (MISCELLANEOUS) ×1
COOLER ICEMAN CLASSIC (MISCELLANEOUS) IMPLANT
COVER SURGICAL LIGHT HANDLE (MISCELLANEOUS) ×2 IMPLANT
CUFF TOURN SGL QUICK 34 (TOURNIQUET CUFF) ×1
CUFF TOURN SGL QUICK 42 (TOURNIQUET CUFF) IMPLANT
CUFF TRNQT CYL 34X4.125X (TOURNIQUET CUFF) ×2 IMPLANT
DRAPE INCISE IOBAN 66X45 STRL (DRAPES) IMPLANT
DRAPE ORTHO SPLIT 77X108 STRL (DRAPES) ×3
DRAPE SURG ORHT 6 SPLT 77X108 (DRAPES) ×6 IMPLANT
DRAPE U-SHAPE 47X51 STRL (DRAPES) ×2 IMPLANT
DRSG AQUACEL AG ADV 3.5X14 (GAUZE/BANDAGES/DRESSINGS) IMPLANT
DURAPREP 26ML APPLICATOR (WOUND CARE) ×4 IMPLANT
ELECT CAUTERY BLADE 6.4 (BLADE) ×2 IMPLANT
ELECT REM PT RETURN 9FT ADLT (ELECTROSURGICAL) ×1
ELECTRODE REM PT RTRN 9FT ADLT (ELECTROSURGICAL) ×2 IMPLANT
GAUZE SPONGE 4X4 12PLY STRL (GAUZE/BANDAGES/DRESSINGS) ×2 IMPLANT
GLOVE BIOGEL PI IND STRL 7.0 (GLOVE) ×2 IMPLANT
GLOVE BIOGEL PI IND STRL 8 (GLOVE) ×2 IMPLANT
GLOVE ECLIPSE 7.0 STRL STRAW (GLOVE) ×2 IMPLANT
GLOVE ECLIPSE 8.0 STRL XLNG CF (GLOVE) ×2 IMPLANT
GLOVE SURG ENC MOIS LTX SZ6.5 (GLOVE) ×6 IMPLANT
GOWN STRL REUS W/ TWL LRG LVL3 (GOWN DISPOSABLE) ×6 IMPLANT
GOWN STRL REUS W/TWL LRG LVL3 (GOWN DISPOSABLE) ×3
HANDPIECE INTERPULSE COAX TIP (DISPOSABLE) ×1
HOOD PEEL AWAY T7 (MISCELLANEOUS) ×6 IMPLANT
IMMOBILIZER KNEE 20 (SOFTGOODS)
IMMOBILIZER KNEE 20 THIGH 36 (SOFTGOODS) IMPLANT
IMMOBILIZER KNEE 22 UNIV (SOFTGOODS) IMPLANT
IMMOBILIZER KNEE 24 THIGH 36 (MISCELLANEOUS) IMPLANT
IMMOBILIZER KNEE 24 UNIV (MISCELLANEOUS)
INSERT TIB ASF 16 6-7/EF R (Insert) IMPLANT
KIT BASIN OR (CUSTOM PROCEDURE TRAY) ×2 IMPLANT
KIT TURNOVER KIT B (KITS) ×2 IMPLANT
MANIFOLD NEPTUNE II (INSTRUMENTS) ×2 IMPLANT
NDL 22X1.5 STRL (OR ONLY) (MISCELLANEOUS) ×4 IMPLANT
NDL SPNL 18GX3.5 QUINCKE PK (NEEDLE) ×2 IMPLANT
NEEDLE 22X1.5 STRL (OR ONLY) (MISCELLANEOUS) ×2 IMPLANT
NEEDLE SPNL 18GX3.5 QUINCKE PK (NEEDLE) ×1 IMPLANT
NS IRRIG 1000ML POUR BTL (IV SOLUTION) ×4 IMPLANT
PACK TOTAL JOINT (CUSTOM PROCEDURE TRAY) ×2 IMPLANT
PAD ARMBOARD 7.5X6 YLW CONV (MISCELLANEOUS) ×4 IMPLANT
PAD CAST 4YDX4 CTTN HI CHSV (CAST SUPPLIES) ×2 IMPLANT
PAD COLD SHLDR WRAP-ON (PAD) IMPLANT
PADDING CAST COTTON 4X4 STRL (CAST SUPPLIES) ×1
PADDING CAST COTTON 6X4 STRL (CAST SUPPLIES) ×2 IMPLANT
PIN DRILL HDLS TROCAR 75 4PK (PIN) IMPLANT
SCREW FEMALE HEX FIX 25X2.5 (ORTHOPEDIC DISPOSABLE SUPPLIES) IMPLANT
SET HNDPC FAN SPRY TIP SCT (DISPOSABLE) ×2 IMPLANT
SPIKE FLUID TRANSFER (MISCELLANEOUS) ×2 IMPLANT
STEM POLY PAT PLY 35M KNEE (Knees) IMPLANT
STEM TIB ST PERS 14+30 (Stem) IMPLANT
STEM TIBIA 5 DEG SZ E R KNEE (Knees) IMPLANT
STRIP CLOSURE SKIN 1/2X4 (GAUZE/BANDAGES/DRESSINGS) ×4 IMPLANT
SUCTION TUBE FRAZIER 10FR DISP (SUCTIONS) ×2 IMPLANT
SUT MNCRL AB 3-0 PS2 18 (SUTURE) ×2 IMPLANT
SUT VIC AB 0 CT1 27 (SUTURE) ×3
SUT VIC AB 0 CT1 27XBRD ANBCTR (SUTURE) ×6 IMPLANT
SUT VIC AB 1 CT1 27 (SUTURE) ×4
SUT VIC AB 1 CT1 27XBRD ANBCTR (SUTURE) IMPLANT
SUT VIC AB 1 CT1 36 (SUTURE) ×10 IMPLANT
SUT VIC AB 2-0 CT1 27 (SUTURE) ×4
SUT VIC AB 2-0 CT1 TAPERPNT 27 (SUTURE) ×8 IMPLANT
SUT VIC AB 2-0 CT2 27 (SUTURE) IMPLANT
SYR 30ML LL (SYRINGE) ×6 IMPLANT
SYR CONTROL 10ML LL (SYRINGE) IMPLANT
SYR TB 1ML LUER SLIP (SYRINGE) ×2 IMPLANT
TIBIA STEM 5 DEG SZ E R KNEE (Knees) ×1 IMPLANT
TOWEL GREEN STERILE (TOWEL DISPOSABLE) ×4 IMPLANT
TOWEL GREEN STERILE FF (TOWEL DISPOSABLE) ×4 IMPLANT
TRAY CATH INTERMITTENT SS 16FR (CATHETERS) IMPLANT
WATER STERILE IRR 1000ML POUR (IV SOLUTION) IMPLANT
YANKAUER SUCT BULB TIP NO VENT (SUCTIONS) ×2 IMPLANT

## 2023-04-11 NOTE — Transfer of Care (Signed)
Immediate Anesthesia Transfer of Care Note  [name]  Procedure(s) Performed: RIGHT TOTAL KNEE ARTHROPLASTY-CEMENTED (Right: Knee)  Patient Location: PACU  Anesthesia Type:MAC, Regional, and Spinal  Level of Consciousness: awake, alert , and oriented  Airway & Oxygen Therapy: Patient Spontanous Breathing  Post-op Assessment: Report given to RN and Post -op Vital signs reviewed and stable  Post vital signs: Reviewed and stable  Last Vitals:  Vitals Value Taken Time  BP 95/76 04/11/23 1900  Temp 36.4 C 04/11/23 1848  Pulse 86 04/11/23 1911  Resp 14 04/11/23 1911  SpO2 97 % 04/11/23 1911  Vitals shown include unvalidated device data.  Last Pain:  Vitals:   04/11/23 1848  TempSrc:   PainSc: 0-No pain         Complications: No notable events documented.

## 2023-04-11 NOTE — Progress Notes (Signed)
Pt arrived from PACU to [address] on [address] [place]. Pt AAOX4 and vital signs are stable. Rash noted on patients chest, back, abdomen, and upper extremities. MD Steward Drone made aware. Verbal orders to hold IV Vancomycin. Family at bedside.

## 2023-04-11 NOTE — H&P (Signed)
TOTAL KNEE ADMISSION H&P  Patient is being admitted for right total knee arthroplasty.  Subjective:  Chief Complaint:right knee pain.  HPI: Frank [name], 72 y.o. male, has a history of pain and functional disability in the right knee due to arthritis and has failed non-surgical conservative treatments for greater than 12 weeks to includeNSAID's and/or analgesics, corticosteriod injections, flexibility and strengthening excercises, use of assistive devices, and activity modification.  Onset of symptoms was gradual, starting >10 years ago with gradually worsening course since that time. The patient noted prior procedures on the knee to include  arthroscopy and menisectomy on the right knee(s).  Patient currently rates pain in the right knee(s) at 9 out of 10 with activity. Patient has night pain, worsening of pain with activity and weight bearing, pain that interferes with activities of daily living, pain with passive range of motion, crepitus, and joint swelling.  Patient has evidence of subchondral sclerosis, joint subluxation, and joint space narrowing by imaging studies. This patient has had  a good result with his left total knee replacement.  No personal or family history of DVT or pulmonary embolism. . There is no active infection.  Patient Active Problem List   Diagnosis Date Noted   Arthritis of knee 12/18/2022   Nonrheumatic mitral valve regurgitation s/p repair 2023    Peroneal tendinitis of right lower extremity 04/25/2021   Benign localized prostatic hyperplasia with lower urinary tract symptoms (LUTS) 09/27/2020   Wheezing 09/14/2020   Chondrocalcinosis 03/14/2020   Dyslipidemia 09/23/2017   Bilateral primary osteoarthritis of knee 09/02/2017   Neck pain 02/15/2017   Primary osteoarthritis of right hip 02/15/2017   Tear of right acetabular labrum 02/15/2017   Right leg pain 01/04/2017   Obese 08/17/2015   Elevated blood sugar 08/17/2015   Shingles 09/10/2014   Dyspnea  11/27/2011   HERNIA, UMBILICAL 08/02/2008   Essential hypertension 01/30/2008   Allergic rhinitis 01/30/2008   GERD 01/30/2008   HEADACHE 01/30/2008   Past Medical History:  Diagnosis Date   Allergy    Asthma    Coronary artery disease    GERD (gastroesophageal reflux disease)    Headache(784.0)    History of kidney stones    Hypertension    Osteoarthritis of knee    right    Past Surgical History:  Procedure Laterality Date   CARDIAC CATHETERIZATION     HERNIA REPAIR  1969, 2009   kidney stones  05/20/1995   KNEE SURGERY  02/26/1989   MITRAL VALVE REPAIR  01/02/2022   NASAL SINUS SURGERY  10/24/1992   RIGHT/LEFT HEART CATH AND CORONARY ANGIOGRAPHY N/A 11/15/2021   Procedure: RIGHT/LEFT HEART CATH AND CORONARY ANGIOGRAPHY;  Surgeon: Orbie Pyo, MD;  Location: [facility] INVASIVE CV LAB;  Service: Cardiovascular;  Laterality: N/A;   ROTATOR CUFF REPAIR Left 04/25/1993   TEE WITHOUT CARDIOVERSION N/A 08/24/2021   Procedure: TRANSESOPHAGEAL ECHOCARDIOGRAM (TEE);  Surgeon: Jodelle Red, MD;  Location: [facility] ENDOSCOPY;  Service: Cardiovascular;  Laterality: N/A;   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   Procedure: LEFT TOTAL KNEE ARTHROPLASTY;  Surgeon: Cammy Copa, MD;  Location: [facility];  Service: Orthopedics;  Laterality: Left;    Current Facility-Administered Medications  Medication Dose Route Frequency Provider Last Rate Last Admin   ceFAZolin (ANCEF) IVPB 2g/100 mL premix  2 g Intravenous On Call to OR Magnant, Joycie Peek, PA-C       lactated ringers infusion   Intravenous Continuous Marcene Duos, MD 10 mL/hr at 04/11/23 1308 New Bag  at 04/11/23 1308   povidone-iodine 10 % swab 2 Application  2 Application Topical Once Magnant, Braydyn L, PA-C       tranexamic acid (CYKLOKAPRON) 2,000 mg in sodium chloride 0.9 % 50 mL Topical Application  2,000 mg Topical To OR August Saucer, Corrie Mckusick, MD       tranexamic acid (CYKLOKAPRON) IVPB 1,000 mg  1,000 mg Intravenous To  OR Magnant, Tupac L, PA-C       Facility-Administered Medications Ordered in Other Encounters  Medication Dose Route Frequency Provider Last Rate Last Admin   cloNIDine (DURACLON) 100 mcg/mL inj- OR Only   Infiltration Anesthesia Intra-op Marcene Duos, MD   50 mcg at 04/11/23 1405   ropivacaine (PF) 5 mg/mL (0.5%) (NAROPIN) injection   Peri-NEURAL Anesthesia Intra-op Marcene Duos, MD   20 mL at 04/11/23 1405   Allergies  Allergen Reactions   Meloxicam Hives and Shortness Of Breath   Atorvastatin Other (See Comments)    Joint pain   Metoprolol Rash    Weakness / fatigue    Tomato     Mouth breaks out    Social History   Tobacco Use   Smoking status: Former    Types: Cigarettes    Quit date: 1972    Years since quitting: 52.4   Smokeless tobacco: Never   Tobacco comments:    quit in 1972  Substance Use Topics   Alcohol use: No    Family History  Problem Relation Age of Onset   Kidney disease Mother    COPD Father    Pneumonia Father    Cardiomyopathy Brother    Hypertension Sister    Heart disease Brother    Hypertension Brother    Hypertension Brother    Hypertension Sister    Diabetes Sister    Osteoarthritis Sister      Review of Systems  Musculoskeletal:  Positive for arthralgias.  All other systems reviewed and are negative.   Objective:  Physical Exam Vitals reviewed.  HENT:     Head: Normocephalic.     Nose: Nose normal.     Mouth/Throat:     Mouth: Mucous membranes are moist.  Eyes:     Pupils: Pupils are equal, round, and reactive to light.  Cardiovascular:     Rate and Rhythm: Normal rate.     Pulses: Normal pulses.  Pulmonary:     Effort: Pulmonary effort is normal.  Abdominal:     General: Abdomen is flat.  Musculoskeletal:     Cervical back: Normal range of motion.  Skin:    General: Skin is warm.     Capillary Refill: Capillary refill takes less than 2 seconds.  Neurological:     General: No focal deficit present.      Mental Status: He is alert.  Psychiatric:        Mood and Affect: Mood normal.   Ortho exam demonstrates range of motion on the left of 0-1 10. Range of motion on the right also 0-1 10 but with varus deformity. Pedal pulses palpable. Ankle dorsiflexion intact.  Skin intact.  No groin pain on the right-hand side with internal and external rotation of the leg. Vital signs in last 24 hours: Temp:  [98.3 F (36.8 C)] 98.3 F (36.8 C) (06/13 1243) Pulse Rate:  [71-79] 74 (06/13 1409) Resp:  [9-18] 9 (06/13 1409) BP: (116-153)/(90-99) 137/96 (06/13 1409) SpO2:  [97 %-99 %] 98 % (06/13 1409) Weight:  [79.4 kg] 79.4 kg (06/13 1243)  Labs:   Estimated body mass index is 32.01 kg/m as calculated from the following:   Height as of this encounter: 5\' 2"  (1.575 m).   Weight as of this encounter: 79.4 kg.   Imaging Review Plain radiographs demonstrate severe degenerative joint disease of the right knee(s). The overall alignment issignificant varus. The bone quality appears to be good for age and reported activity level.      Assessment/Plan:  End stage arthritis, right knee   The patient history, physical examination, clinical judgment of the provider and imaging studies are consistent with end stage degenerative joint disease of the right knee(s) and total knee arthroplasty is deemed medically necessary. The treatment options including medical management, injection therapy arthroscopy and arthroplasty were discussed at length. The risks and benefits of total knee arthroplasty were presented and reviewed. The risks due to aseptic loosening, infection, stiffness, patella tracking problems, thromboembolic complications and other imponderables were discussed. The patient acknowledged the explanation, agreed to proceed with the plan and consent was signed. Patient is being admitted for inpatient treatment for surgery, pain control, PT, OT, prophylactic antibiotics, VTE prophylaxis, progressive  ambulation and ADL's and discharge planning. The patient is planning to be discharged home with home health services     Patient's anticipated LOS is less than 2 midnights, meeting these requirements: - Younger than 28 - Lives within 1 hour of care - Has a competent adult at home to recover with post-op recover - NO history of  - Chronic pain requiring opiods  - Diabetes  - Coronary Artery Disease  - Heart failure  - Heart attack  - Stroke  - DVT/VTE  - Cardiac arrhythmia  - Respiratory Failure/COPD  - Renal failure  - Anemia  - Advanced Liver disease

## 2023-04-11 NOTE — Op Note (Signed)
NAMEPAYTEN, ELMI MEDICAL RECORD NO: [id number] ACCOUNT NO: [id number] DATE OF BIRTH: 05-25-1951 FACILITY: [facility] LOCATION: [facility] PHYSICIAN: Graylin Shiver. August Saucer, MD  Operative Report   PREOPERATIVE DIAGNOSIS:  Right knee arthritis.  POSTOPERATIVE DIAGNOSIS:  Right knee arthritis.  PROCEDURE:  Right total knee replacement using Biomet components, Persona personalized knee system size 7 cruciate retaining cemented standard femur with 5-degree size E cemented stemmed tibia with 14 mm diameter, 30 length cemented tapered stem  extension with 35 mm x 9 mm thickness cemented patella and 16 mm medial congruent articulating polyethylene spacer.  SURGEON:  Graylin Shiver. August Saucer, MD  ASSISTANT:  Karenann Cai, PA.  INDICATIONS:  The patient is a 72 year old patient with right knee pain refractory to nonoperative management, who presents for operative management after explanation of risks and benefits.  PROCEDURE IN DETAIL:  The patient was brought to the operating room where general endotracheal anesthesia was induced.  Preoperative antibiotics administered.  Timeout was called.  Right knee was prescrubbed with alcohol and Betadine, allowed to air dry,  prepped with DuraPrep solution and draped in sterile manner.  Ioban used to cover the operative field.  Timeout was called.  Leg was elevated, exsanguinated with the Esmarch wrap.  Tourniquet was inflated.  Anterior approach to the knee was made.  Skin  and subcutaneous tissue were sharply divided.  IrriSept solution utilized. Median parapatellar arthrotomy was made and marked with #1 Vicryl suture.  Fat pad partially excised.  Medial soft tissue dissection was performed proportionate to the patient's  moderate varus deformity.  ACL was not present.  Severe medial compartment arthritis as well as tricompartmental arthritis was present, but there was some significant erosion on the medial tibial plateau.  Lateral patellofemoral ligament was released.   Soft  tissue removed from the anterior distal femur.  Patella everted.  Knee flexed.  Osteophytes were removed.  Intramedullary alignment was then used to make a cut perpendicular to the mechanical axis with the posterior neurovascular structures and  collateral ligaments protected.  Essentially, this was a skim cut on the medial side and a 12-13 mm cut on the lateral side.  Femur was then cut in 5 degrees of valgus at 10 mm.  This allowed a 12 mm spacer to sit in the extension space.  Next, the femur  was sized to a size 7, which was comparable to his other side.  Trial components were placed.  A femur was then sized to a size 7 and then was cut in 3 degrees of external rotation, which gave [place] symmetric flexion and extension gap.  Trial components in  placed.  The tibial tray was placed in line with the medial third of the tibial tubercle.  Then, the patella was cut down from 23-14 mm.  Trial patellar button was placed.  With trial components in positioning, we did upgrade the polyethylene to a size  14.  That gave slight hyperextension, but pretty reasonable stability to varus and valgus stress at 0, 30 and 90 degrees, Patella tracked well.  Trial components were removed.  Thorough irrigation was performed.  Capsule was anesthetized using saline,  Marcaine and Exparel.  TXA sponge allowed to sit in the knee for 3 minutes along with IrriSept solution.  Tourniquet was let down for 1 minute in order to make sure there was no bleeding with a larger than normal tibial cut on that lateral side.  No  bleeding was encountered.  It was put back up.  At that time,  we irrigated the knee out again and dried this bony surfaces.  Bone plugs were placed in the femur and tibia.  Cement was then placed on the dried surfaces and we also put some vancomycin in  the tibial canal.  Components were placed with the 14 mm trial spacer.  Excess cement was removed.  The 16 medial congruent polyethylene insert gave excellent stability and  full extension without hyperextension.  This was the one that was placed.  After  that was performed, the patient had excellent range of motion with no liftoff and good patellar tracking with no thumbs technique.  Tourniquet was released.  A 4 liters of pouring irrigation utilized.  Bleeding points encountered controlled using electrocautery.  Median parapatellar arthrotomy was closed over bolster using #1 Vicryl suture.  Prior to final closure, we did irrigate  the whole joint again with IrriSept solution, sucked that out and then put in some vancomycin powder and closed up the arthrotomy with #1 Vicryl suture.  We then irrigated that layer on top with IrriSept solution and placed vancomycin powder and then  closed using 0 Vicryl suture, 2-0 Vicryl suture, and 3-0 Monocryl.  Prior to final closure, we did place some Marcaine, morphine, clonidine into the knee joint for postoperative pain relief.  Aquacel dressing followed by Ace wrap and knee immobilizer was  placed.  The patient tolerated the procedure well without immediate complications.  It should be noted that when we took the drapes down, he did have a rash on his extremities.  Difficult to say if this was an allergic reaction to albumin which was  given versus an allergic reaction to Ancef.  For that reason, we will place him on vancomycin and Levaquin for 1 dose postop for prophylaxis.  He was transferred to the recovery room in stable condition.  Luke's assistance was required at all times for  retraction, opening, closing, mobilization of tissue.  His assistance was a medical necessity.   NIK D: 04/11/2023 7:13:13 pm T: 04/11/2023 7:59:00 pm  JOB: [id number]/ [id number]

## 2023-04-11 NOTE — Anesthesia Procedure Notes (Signed)
Spinal  Patient location during procedure: OR Start time: 04/11/2023 3:30 PM End time: 04/11/2023 3:36 PM Reason for block: surgical anesthesia Staffing Performed: anesthesiologist  Anesthesiologist: Lannie Fields, DO Performed by: Lannie Fields, DO Authorized by: Lannie Fields, DO   Preanesthetic Checklist Completed: patient identified, IV checked, risks and benefits discussed, surgical consent, monitors and equipment checked, pre-op evaluation and timeout performed Spinal Block Patient position: sitting Prep: DuraPrep and site prepped and draped Patient monitoring: cardiac monitor, continuous pulse ox and blood pressure Approach: midline Location: L3-4 Injection technique: single-shot Needle Needle type: Pencan  Needle gauge: 24 G Needle length: 9 cm Assessment Sensory level: T6 Events: CSF return Additional Notes Functioning IV was confirmed and monitors were applied. Sterile prep and drape, including hand hygiene and sterile gloves were used. The patient was positioned and the spine was prepped. The skin was anesthetized with lidocaine.  Free flow of clear CSF was obtained prior to injecting local anesthetic into the CSF.  The spinal needle aspirated freely following injection.  The needle was carefully withdrawn.  The patient tolerated the procedure well.

## 2023-04-11 NOTE — Progress Notes (Signed)
Orthopedic Tech Progress Note Patient Details:  Frank [name] May 18, 1951 [id number]  CPM Right Knee CPM Right Knee: On Right Knee Flexion (Degrees): 10 Right Knee Extension (Degrees): 40  Post Interventions Patient Tolerated: Well  Al Decant 04/11/2023, 10:55 PM

## 2023-04-11 NOTE — Anesthesia Procedure Notes (Signed)
Anesthesia Regional Block: Adductor canal block   Pre-Anesthetic Checklist: , timeout performed,  Correct Patient, Correct Site, Correct Laterality,  Correct Procedure, Correct Position, site marked,  Risks and benefits discussed,  Surgical consent,  Pre-op evaluation,  At surgeon's request and post-op pain management  Laterality: Right  Prep: chloraprep       Needles:  Injection technique: Single-shot  Needle Type: Echogenic Needle     Needle Length: 9cm  Needle Gauge: 21     Additional Needles:   Procedures:,,,, ultrasound used (permanent image in chart),,    Narrative:  Start time: 04/11/2023 2:00 PM End time: 04/11/2023 2:05 PM Injection made incrementally with aspirations every 5 mL.  Performed by: Personally  Anesthesiologist: Marcene Duos, MD

## 2023-04-11 NOTE — Brief Op Note (Signed)
   04/11/2023  7:04 PM  PATIENT:  [name]  72 y.o. male  PRE-OPERATIVE DIAGNOSIS:  RIGHT KNEE OSTEOARTHRITIS  POST-OPERATIVE DIAGNOSIS:  RIGHT KNEE OSTEOARTHRITIS  PROCEDURE:  Procedure(s): RIGHT TOTAL KNEE ARTHROPLASTY-CEMENTED  SURGEON:  Surgeon(s): Cammy Copa, MD  ASSISTANT: magnant pa  ANESTHESIA:   spinal  EBL: 50 ml    No intake/output data recorded.  BLOOD ADMINISTERED: none  DRAINS: none   LOCAL MEDICATIONS USED:  marcaine mso4 clonidine vanco  SPECIMEN:  No Specimen  COUNTS:  YES  TOURNIQUET:   Total Tourniquet Time Documented: Thigh (Right) - 63 minutes Thigh (Right) - 36 minutes Total: Thigh (Right) - 99 minutes   DICTATION: .Other Dictation: Dictation Number [id number]  PLAN OF CARE: Admit for overnight observation  PATIENT DISPOSITION:  PACU - hemodynamically stable

## 2023-04-11 NOTE — Anesthesia Procedure Notes (Signed)
Procedure Name: MAC Date/Time: 04/11/2023 3:30 PM  Performed by: Laruth Bouchard., CRNAPre-anesthesia Checklist: Patient identified, Emergency Drugs available, Suction available, Patient being monitored and Timeout performed Patient Re-evaluated:Patient Re-evaluated prior to induction Oxygen Delivery Method: Simple face mask Preoxygenation: Pre-oxygenation with 100% oxygen Induction Type: IV induction Placement Confirmation: positive ETCO2

## 2023-04-11 NOTE — Anesthesia Postprocedure Evaluation (Signed)
Anesthesia Post Note  [name]  Procedure(s) Performed: RIGHT TOTAL KNEE ARTHROPLASTY-CEMENTED (Right: Knee)     Patient location during evaluation: PACU Anesthesia Type: Spinal and Regional Level of consciousness: awake Pain management: pain level controlled Vital Signs Assessment: post-procedure vital signs reviewed and stable Respiratory status: spontaneous breathing, nonlabored ventilation and respiratory function stable Cardiovascular status: blood pressure returned to baseline and stable Postop Assessment: no apparent nausea or vomiting Anesthetic complications: no   No notable events documented.  Last Vitals:  Vitals:   04/11/23 1930 04/11/23 1954  BP: 102/80 111/75  Pulse: 82 77  Resp: 14 18  Temp:  36.4 C  SpO2: 90% 100%    Last Pain:  Vitals:   04/11/23 1915  TempSrc:   PainSc: Asleep                 Eean Buss P Samvel Zinn

## 2023-04-12 ENCOUNTER — Other Ambulatory Visit: Payer: Self-pay | Admitting: *Deleted

## 2023-04-12 DIAGNOSIS — Z96651 Presence of right artificial knee joint: Secondary | ICD-10-CM

## 2023-04-12 DIAGNOSIS — Z96652 Presence of left artificial knee joint: Secondary | ICD-10-CM | POA: Diagnosis not present

## 2023-04-12 DIAGNOSIS — Z79899 Other long term (current) drug therapy: Secondary | ICD-10-CM | POA: Diagnosis not present

## 2023-04-12 DIAGNOSIS — I2510 Atherosclerotic heart disease of native coronary artery without angina pectoris: Secondary | ICD-10-CM | POA: Diagnosis not present

## 2023-04-12 DIAGNOSIS — J45909 Unspecified asthma, uncomplicated: Secondary | ICD-10-CM | POA: Diagnosis not present

## 2023-04-12 DIAGNOSIS — Z87891 Personal history of nicotine dependence: Secondary | ICD-10-CM | POA: Diagnosis not present

## 2023-04-12 DIAGNOSIS — M1711 Unilateral primary osteoarthritis, right knee: Secondary | ICD-10-CM | POA: Diagnosis not present

## 2023-04-12 DIAGNOSIS — I10 Essential (primary) hypertension: Secondary | ICD-10-CM | POA: Diagnosis not present

## 2023-04-12 MED ORDER — OXYCODONE HCL 5 MG PO TABS: 5.0000 mg | ORAL_TABLET | ORAL | 0 refills | Status: DC | PRN

## 2023-04-12 MED ORDER — ASPIRIN 81 MG PO CHEW: 81.0000 mg | CHEWABLE_TABLET | Freq: Two times a day (BID) | ORAL | 0 refills | Status: AC

## 2023-04-12 MED ORDER — METHOCARBAMOL 500 MG PO TABS: 500.0000 mg | ORAL_TABLET | Freq: Three times a day (TID) | ORAL | 0 refills | Status: DC | PRN

## 2023-04-12 NOTE — Progress Notes (Signed)
Physical Therapy Evaluation Patient Details Name: Frank [name] MRN: [id number] DOB: 1951-03-06 Today's Date: 04/12/2023  History of Present Illness  Pt is 72 yo male presenting for planned R TKA, admitted 6/13. PMH: Allergies, asthma, CAD, GERD, headaches, HTN.  Clinical Impression  Pt was seen for mobility on RW with help to don and use immobilizer on R knee.  Pt can get on and off bed, can maneuver to stand with nearly no help and was min guard for stairs as needed to enter his home.  Follow MD recommendations for follow up rehab, and progress with mobility as tolerated to prepare for home.  DC today is anticipated.        Recommendations for follow up therapy are one component of a multi-disciplinary discharge planning process, led by the attending physician.  Recommendations may be updated based on patient status, additional functional criteria and insurance authorization.  Follow Up Recommendations       Assistance Recommended at Discharge Intermittent Supervision/Assistance  Patient can return home with the following  A little help with walking and/or transfers;A little help with bathing/dressing/bathroom;Assistance with cooking/housework;Assist for transportation;Help with stairs or ramp for entrance    Equipment Recommendations BSC/3in1  Recommendations for Other Services       Functional Status Assessment Patient has had a recent decline in their functional status and demonstrates the ability to make significant improvements in function in a reasonable and predictable amount of time.     Precautions / Restrictions Precautions Precautions: Knee;Fall Precaution Comments: verbally reviewed, has book Restrictions Weight Bearing Restrictions: Yes RLE Weight Bearing: Weight bearing as tolerated  Has order for immobilizer on R knee currently     Mobility  Bed Mobility Overal bed mobility: Needs Assistance Bed Mobility: Supine to Sit, Sit to Supine     Supine to sit:  Min guard Sit to supine: Min guard   General bed mobility comments: pt requires a supervised assist with minor help with covering and extra lines    Transfers Overall transfer level: Needs assistance Equipment used: Rolling walker (2 wheels) Transfers: Sit to/from Stand Sit to Stand: Min guard, Min assist           General transfer comment: min guard to launch and min guard to stady    Ambulation/Gait Ambulation/Gait assistance: Min guard Gait Distance (Feet): 150 Feet Assistive device: Rolling walker (2 wheels) Gait Pattern/deviations: Step-through pattern, Wide base of support, Trunk flexed, Decreased weight shift to right (mild flexion) Gait velocity: reduc Gait velocity interpretation: <1.31 ft/sec, indicative of household ambulator Pre-gait activities: standing balance ck General Gait Details: pt is walking on hallway with RW and has good effort to balance and steady with walker  Stairs Stairs: Yes Stairs assistance: Min guard Stair Management: One rail Right, One rail Left, Step to pattern, Forwards Number of Stairs: 4 General stair comments: reviewed safety with sequence and use of one rail  Wheelchair Mobility    Modified Rankin (Stroke Patients Only)       Balance Overall balance assessment: Needs assistance Sitting-balance support: Feet supported Sitting balance-Leahy Scale: Good     Standing balance support: Bilateral upper extremity supported, During functional activity Standing balance-Leahy Scale: Poor Standing balance comment: requires walker support to balance                             Pertinent Vitals/Pain Pain Assessment Pain Assessment: Faces Faces Pain Scale: Hurts little more Pain Location: R knee Pain  Descriptors / Indicators: Operative site guarding Pain Intervention(s): Limited activity within patient's tolerance, Monitored during session, Premedicated before session, Repositioned, Ice applied    Home Living  Family/patient expects to be discharged to:: Private residence Living Arrangements: Spouse/significant other Available Help at Discharge: Family Type of Home: House Home Access: Stairs to enter Entrance Stairs-Rails: Right;Left;None Secretary/administrator of Steps: 3   Home Layout: One level Home Equipment: Pharmacist, hospital (2 wheels);Rollator (4 wheels);BSC/3in1;Other (comment) (3 wheeled walker)      Prior Function Prior Level of Function : Independent/Modified Independent;Driving             Mobility Comments: RW vs rollator since last surgery ADLs Comments: I for all     Hand Dominance   Dominant Hand: Left    Extremity/Trunk Assessment   Upper Extremity Assessment Upper Extremity Assessment: Overall WFL for tasks assessed    Lower Extremity Assessment Lower Extremity Assessment: RLE deficits/detail RLE Deficits / Details: R knee in immobilizer to move RLE: Unable to fully assess due to immobilization RLE Coordination: decreased gross motor    Cervical / Trunk Assessment Cervical / Trunk Assessment: Kyphotic (mild change)  Communication   Communication: No difficulties  Cognition Arousal/Alertness: Awake/alert Behavior During Therapy: WFL for tasks assessed/performed Overall Cognitive Status: Within Functional Limits for tasks assessed                                          General Comments General comments (skin integrity, edema, etc.): Pt is up walking on RW, took longer trip on hallway with no issues and no dizziness or light headed feeling    Exercises     Assessment/Plan    PT Assessment Patient needs continued PT services  PT Problem List Decreased strength;Decreased activity tolerance;Decreased range of motion;Decreased mobility;Decreased balance;Decreased coordination;Decreased skin integrity;Pain       PT Treatment Interventions DME instruction;Gait training;Stair training;Functional mobility training;Therapeutic  exercise;Therapeutic activities;Balance training;Neuromuscular re-education;Patient/family education    PT Goals (Current goals can be found in the Care Plan section)  Acute Rehab PT Goals Patient Stated Goal: to get home and continue recovery PT Goal Formulation: With patient/family Time For Goal Achievement: 04/19/23 Potential to Achieve Goals: Good    Frequency 7X/week     Co-evaluation               AM-PAC PT "6 Clicks" Mobility  Outcome Measure Help needed turning from your back to your side while in a flat bed without using bedrails?: None Help needed moving from lying on your back to sitting on the side of a flat bed without using bedrails?: A Little Help needed moving to and from a bed to a chair (including a wheelchair)?: A Little Help needed standing up from a chair using your arms (e.g., wheelchair or bedside chair)?: A Little Help needed to walk in hospital room?: A Little Help needed climbing 3-5 steps with a railing? : A Little 6 Click Score: 19    End of Session Equipment Utilized During Treatment: Gait belt;Oxygen Activity Tolerance: Patient tolerated treatment well Patient left: in bed;with call bell/phone within reach;with bed alarm set;with family/visitor present Nurse Communication: Mobility status;Other (comment) (O2 sats WFL on room air) PT Visit Diagnosis: Unsteadiness on feet (R26.81);Muscle weakness (generalized) (M62.81);Pain Pain - Right/Left: Right Pain - part of body: Knee    Time: 4782-9562 PT Time Calculation (min) (ACUTE ONLY): 26 min  Charges:   PT Evaluation $PT Eval Moderate Complexity: 1 Mod PT Treatments $Gait Training: 8-22 mins       Ivar Drape 04/12/2023, 3:25 PM  Samul Dada, PT PhD Acute Rehab Dept. Number: [facility] [id number] and [facility] [phone]

## 2023-04-12 NOTE — Care Plan (Signed)
[facility] RN[facility] spoke with patient and wife prior to surgery in office and discussed his Right total knee replacement. He is an Ortho bundle through [facility] and is agreeable to case management. He had his Left total knee done just over 3 months ago and did very well from this. He has a CPM, RW at home. His wife can assist after discharge. Reviewed post op care instructions. Anticipate HHPT will be needed after short hospital stay. Referral made to [facility] after choice provided. Will continue to follow for needs.

## 2023-04-12 NOTE — Progress Notes (Signed)
Physical Therapy Treatment Patient Details Name: Frank [name] MRN: [id number] DOB: [birth year] Today's Date: 04/12/2023   History of Present Illness Pt is 72 yo male presenting for planned R TKA, admitted 6/13. PMH: Allergies, asthma, CAD, GERD, headaches, HTN.    PT Comments    Pt was seen for mobility on RW today, continues to await his discharge home and MD was notified of progress in therapy.  Pt is demonstrating good awareness of moving onto and off bed, standing without support and walking with supervision in the immobilizer, which was also his presentation this AM.  Follow up on goals of acute PT based on his needs and will follow in AM if he has not left for home by then.  Anticipate pt is going to dc from hosp this PM.   Recommendations for follow up therapy are one component of a multi-disciplinary discharge planning process, led by the attending physician.  Recommendations may be updated based on patient status, additional functional criteria and insurance authorization.  Follow Up Recommendations       Assistance Recommended at Discharge Intermittent Supervision/Assistance  Patient can return home with the following A little help with walking and/or transfers;A little help with bathing/dressing/bathroom;Assistance with cooking/housework;Assist for transportation;Help with stairs or ramp for entrance   Equipment Recommendations  BSC/3in1    Recommendations for Other Services       Precautions / Restrictions Precautions Precautions: Knee;Fall Precaution Comments: verbally reviewed, has book Restrictions Weight Bearing Restrictions: Yes RLE Weight Bearing: Weight bearing as tolerated     Mobility  Bed Mobility Overal bed mobility: Needs Assistance Bed Mobility: Supine to Sit, Sit to Supine     Supine to sit: Min guard Sit to supine: Min guard   General bed mobility comments: pt requires a supervised assist with minor help with covering and extra lines     Transfers Overall transfer level: Needs assistance Equipment used: Rolling walker (2 wheels) Transfers: Sit to/from Stand Sit to Stand: Min guard, Min assist           General transfer comment: min guard to launch and min guard to stady    Ambulation/Gait Ambulation/Gait assistance: Min guard Gait Distance (Feet): 250 Feet Assistive device: Rolling walker (2 wheels) Gait Pattern/deviations: Step-through pattern, Wide base of support, Trunk flexed, Decreased weight shift to right (mild flexion) Gait velocity: reduc Gait velocity interpretation: <1.31 ft/sec, indicative of household ambulator Pre-gait activities: standing balance ck General Gait Details: pt is walking on hallway with RW and has good effort to balance and steady with walker   Stairs Stairs: Yes Stairs assistance: Min guard Stair Management: One rail Right, One rail Left, Step to pattern, Forwards Number of Stairs: 4 General stair comments: reviewed safety with sequence and use of one rail   Wheelchair Mobility    Modified Rankin (Stroke Patients Only)       Balance Overall balance assessment: Needs assistance Sitting-balance support: Feet supported Sitting balance-Leahy Scale: Good     Standing balance support: Bilateral upper extremity supported, During functional activity Standing balance-Leahy Scale: Poor Standing balance comment: requires walker support to balance                            Cognition Arousal/Alertness: Awake/alert Behavior During Therapy: WFL for tasks assessed/performed Overall Cognitive Status: Within Functional Limits for tasks assessed  Exercises      General Comments General comments (skin integrity, edema, etc.): Pt walked with slow pace, worked on obstacle clearance and control of standing balance.  Continues to have order for immobilzer so wore again today.  Pt is able to SLR on RLE so may be  ready to discharge.      Pertinent Vitals/Pain Pain Assessment Pain Assessment: Faces Faces Pain Scale: Hurts a little bit Pain Location: R knee Pain Descriptors / Indicators: Operative site guarding Pain Intervention(s): Monitored during session, Repositioned    Home Living Family/patient expects to be discharged to:: Private residence Living Arrangements: Spouse/significant other Available Help at Discharge: Family Type of Home: House Home Access: Stairs to enter Entrance Stairs-Rails: Right;Left;None Secretary/administrator of Steps: 3   Home Layout: One level Home Equipment: Pharmacist, hospital (2 wheels);Rollator (4 wheels);BSC/3in1;Other (comment) (3 wheeled walker)      Prior Function            PT Goals (current goals can now be found in the care plan section) Acute Rehab PT Goals Patient Stated Goal: to get home and continue recovery PT Goal Formulation: With patient/family Time For Goal Achievement: 04/19/23 Potential to Achieve Goals: Good    Frequency    7X/week      PT Plan      Co-evaluation              AM-PAC PT "6 Clicks" Mobility   Outcome Measure  Help needed turning from your back to your side while in a flat bed without using bedrails?: None Help needed moving from lying on your back to sitting on the side of a flat bed without using bedrails?: A Little Help needed moving to and from a bed to a chair (including a wheelchair)?: A Little Help needed standing up from a chair using your arms (e.g., wheelchair or bedside chair)?: A Little Help needed to walk in hospital room?: A Little Help needed climbing 3-5 steps with a railing? : A Little 6 Click Score: 19    End of Session Equipment Utilized During Treatment: Gait belt;Oxygen Activity Tolerance: Patient tolerated treatment well Patient left: in bed;with call bell/phone within reach;with bed alarm set;with family/visitor present Nurse Communication: Mobility status;Other  (comment) (O2 sats WFL on room air) PT Visit Diagnosis: Unsteadiness on feet (R26.81);Muscle weakness (generalized) (M62.81);Pain Pain - Right/Left: Right Pain - part of body: Knee     Time: 1256-1316 PT Time Calculation (min) (ACUTE ONLY): 20 min  Charges:  $Gait Training: 8-22 mins     Ivar Drape 04/12/2023, 5:14 PM  Samul Dada, PT PhD Acute Rehab Dept. Number: [facility] [id number] and [facility] [phone]

## 2023-04-12 NOTE — Progress Notes (Signed)
  Subjective: Patient stable.  Pain began around 6 AM this morning.  His rash has improved.  He describes similar episode of having a rash that he may have associated with meloxicam use.  Tolerated his different antibiotics well.   Objective: Vital signs in last 24 hours: Temp:  [97.5 F (36.4 C)-98.3 F (36.8 C)] 98 F (36.7 C) (06/14 0714) Pulse Rate:  [70-95] 72 (06/14 0714) Resp:  [9-23] 16 (06/14 0714) BP: (95-153)/(59-99) 119/78 (06/14 0714) SpO2:  [90 %-100 %] 100 % (06/14 0714) Weight:  [79.4 kg] 79.4 kg (06/13 1243)  Intake/Output from previous day: 06/13 0701 - 06/14 0700 In: 2344.8 [I.V.:1644.8; IV Piggyback:700] Out: 1075 [Urine:1000; Blood:75] Intake/Output this shift: No intake/output data recorded.  Exam:  Sensation intact distally Intact pulses distally Dorsiflexion/Plantar flexion intact  No rash on the extremities or trunk Labs: No results for input(s): "HGB" in the last 72 hours. No results for input(s): "WBC", "RBC", "HCT", "PLT" in the last 72 hours. No results for input(s): "NA", "K", "CL", "CO2", "BUN", "CREATININE", "GLUCOSE", "CALCIUM" in the last 72 hours. No results for input(s): "LABPT", "INR" in the last 72 hours.  Assessment/Plan: Patient is doing well.  He can do several straight leg raises.  Dressing is dry.  Physical therapy this morning and this afternoon and I anticipate he should be able to go home this afternoon.  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  Would not send him home with anti-inflammatories based on his history today.   Marrianne Mood Yassmine Tamm 04/12/2023, 7:58 AM

## 2023-04-13 DIAGNOSIS — E785 Hyperlipidemia, unspecified: Secondary | ICD-10-CM | POA: Diagnosis not present

## 2023-04-13 DIAGNOSIS — K219 Gastro-esophageal reflux disease without esophagitis: Secondary | ICD-10-CM | POA: Diagnosis not present

## 2023-04-13 DIAGNOSIS — Z471 Aftercare following joint replacement surgery: Secondary | ICD-10-CM | POA: Diagnosis not present

## 2023-04-13 DIAGNOSIS — J45909 Unspecified asthma, uncomplicated: Secondary | ICD-10-CM | POA: Diagnosis not present

## 2023-04-13 DIAGNOSIS — Z96651 Presence of right artificial knee joint: Secondary | ICD-10-CM | POA: Diagnosis not present

## 2023-04-13 DIAGNOSIS — I2510 Atherosclerotic heart disease of native coronary artery without angina pectoris: Secondary | ICD-10-CM | POA: Diagnosis not present

## 2023-04-13 DIAGNOSIS — I119 Hypertensive heart disease without heart failure: Secondary | ICD-10-CM | POA: Diagnosis not present

## 2023-04-15 ENCOUNTER — Encounter (HOSPITAL_COMMUNITY): Payer: Self-pay | Admitting: Orthopedic Surgery

## 2023-04-15 ENCOUNTER — Telehealth: Payer: Self-pay | Admitting: *Deleted

## 2023-04-15 NOTE — Telephone Encounter (Signed)
Patient called stating he is not yet out of pain medication or muscle relaxer, but wanted refills sent in so he wouldn't forget. I told him I'd request from you. He knows pharmacy won't fill yet. Thank you.

## 2023-04-16 DIAGNOSIS — E785 Hyperlipidemia, unspecified: Secondary | ICD-10-CM | POA: Diagnosis not present

## 2023-04-16 DIAGNOSIS — Z471 Aftercare following joint replacement surgery: Secondary | ICD-10-CM | POA: Diagnosis not present

## 2023-04-16 DIAGNOSIS — I119 Hypertensive heart disease without heart failure: Secondary | ICD-10-CM | POA: Diagnosis not present

## 2023-04-16 DIAGNOSIS — K219 Gastro-esophageal reflux disease without esophagitis: Secondary | ICD-10-CM | POA: Diagnosis not present

## 2023-04-16 DIAGNOSIS — Z96651 Presence of right artificial knee joint: Secondary | ICD-10-CM | POA: Diagnosis not present

## 2023-04-16 DIAGNOSIS — J45909 Unspecified asthma, uncomplicated: Secondary | ICD-10-CM | POA: Diagnosis not present

## 2023-04-16 DIAGNOSIS — I2510 Atherosclerotic heart disease of native coronary artery without angina pectoris: Secondary | ICD-10-CM | POA: Diagnosis not present

## 2023-04-17 ENCOUNTER — Other Ambulatory Visit: Payer: Self-pay | Admitting: Surgical

## 2023-04-17 MED ORDER — OXYCODONE HCL 5 MG PO TABS: 5.0000 mg | ORAL_TABLET | Freq: Four times a day (QID) | ORAL | 0 refills | Status: DC | PRN

## 2023-04-17 MED ORDER — METHOCARBAMOL 500 MG PO TABS: 500.0000 mg | ORAL_TABLET | Freq: Three times a day (TID) | ORAL | 0 refills | Status: DC | PRN

## 2023-04-17 NOTE — Telephone Encounter (Signed)
Sent in

## 2023-04-17 NOTE — Telephone Encounter (Signed)
Call to patient and updated that medication has been sent.

## 2023-04-17 NOTE — Telephone Encounter (Signed)
Patient has called several times to ask about medication refills. Please could you send in his pain medication and muscle relaxer. Thank you.

## 2023-04-18 DIAGNOSIS — Z471 Aftercare following joint replacement surgery: Secondary | ICD-10-CM | POA: Diagnosis not present

## 2023-04-18 DIAGNOSIS — E785 Hyperlipidemia, unspecified: Secondary | ICD-10-CM | POA: Diagnosis not present

## 2023-04-18 DIAGNOSIS — K219 Gastro-esophageal reflux disease without esophagitis: Secondary | ICD-10-CM | POA: Diagnosis not present

## 2023-04-18 DIAGNOSIS — J45909 Unspecified asthma, uncomplicated: Secondary | ICD-10-CM | POA: Diagnosis not present

## 2023-04-18 DIAGNOSIS — I119 Hypertensive heart disease without heart failure: Secondary | ICD-10-CM | POA: Diagnosis not present

## 2023-04-18 DIAGNOSIS — I2510 Atherosclerotic heart disease of native coronary artery without angina pectoris: Secondary | ICD-10-CM | POA: Diagnosis not present

## 2023-04-18 DIAGNOSIS — Z96651 Presence of right artificial knee joint: Secondary | ICD-10-CM | POA: Diagnosis not present

## 2023-04-19 ENCOUNTER — Telehealth: Payer: Self-pay

## 2023-04-19 ENCOUNTER — Telehealth: Payer: Self-pay | Admitting: Orthopedic Surgery

## 2023-04-19 ENCOUNTER — Telehealth: Payer: Self-pay | Admitting: *Deleted

## 2023-04-19 DIAGNOSIS — K219 Gastro-esophageal reflux disease without esophagitis: Secondary | ICD-10-CM | POA: Diagnosis not present

## 2023-04-19 DIAGNOSIS — J45909 Unspecified asthma, uncomplicated: Secondary | ICD-10-CM | POA: Diagnosis not present

## 2023-04-19 DIAGNOSIS — Z96651 Presence of right artificial knee joint: Secondary | ICD-10-CM | POA: Diagnosis not present

## 2023-04-19 DIAGNOSIS — Z471 Aftercare following joint replacement surgery: Secondary | ICD-10-CM | POA: Diagnosis not present

## 2023-04-19 DIAGNOSIS — I119 Hypertensive heart disease without heart failure: Secondary | ICD-10-CM | POA: Diagnosis not present

## 2023-04-19 DIAGNOSIS — E785 Hyperlipidemia, unspecified: Secondary | ICD-10-CM | POA: Diagnosis not present

## 2023-04-19 DIAGNOSIS — I2510 Atherosclerotic heart disease of native coronary artery without angina pectoris: Secondary | ICD-10-CM | POA: Diagnosis not present

## 2023-04-19 MED ORDER — OXYCODONE HCL 5 MG PO TABS: 5.0000 mg | ORAL_TABLET | Freq: Four times a day (QID) | ORAL | 0 refills | Status: DC | PRN

## 2023-04-19 NOTE — Telephone Encounter (Signed)
Medication sent to pharmacy  

## 2023-04-19 NOTE — Telephone Encounter (Signed)
Patient aware and discussed weaning at this point and using Tylenol in between doses for pain relief.

## 2023-04-19 NOTE — Transitions of Care (Post Inpatient/ED Visit) (Signed)
04/19/2023  Name: Frank [name] MRN: [id number] DOB: [birth year]  Today's TOC FU Call Status: Today's TOC FU Call Status:: Successful TOC FU Call Competed TOC FU Call Complete Date: 04/19/23  Transition Care Management Follow-up Telephone Call Date of Discharge: 04/12/23 Discharge Facility: [facility]) Type of Discharge: Inpatient Admission Primary Inpatient Discharge Diagnosis:: "s/p total knee replacement,right" How have you been since you were released from the hospital?: Better (Spoke with spouse-pt oing fairly well-up movingand has been working with therpay.) Any questions or concerns?: Yes Patient Questions/Concerns:: Pt was running out of pain meds and contacted ortho MD office-got refill on med but ordered changed from Oxycodone-2tabs qh4hrs to 1tab q6hrs-they report this is not managing the pt's pain as effectively-they just called ortho office and nurse will call them back after speaking with provider Patient Questions/Concerns Addressed: Other: (pain mgmt discussed with wife/pt-they have already called ortho office and no other interventions needed at this time)  Items Reviewed: Did you receive and understand the discharge instructions provided?: Yes Medications obtained,verified, and reconciled?: Partial Review Completed Reason for Partial Mediation Review: partial review with wife-did not wish to review all meds Any new allergies since your discharge?: No Dietary orders reviewed?: Yes Type of Diet Ordered:: low salt/heart healthy Do you have support at home?: Yes People in Home: spouse Name of Support/Comfort Primary Source: [name]  Medications Reviewed Today: Medications Reviewed Today     Reviewed by Charlyn Minerva, RN (Registered Nurse) on 04/19/23 at 1059  Med List Status: <None>   Medication Order Taking? Sig Documenting Provider Last Dose Status Informant  acetaminophen (TYLENOL) 325 MG tablet [id number] Yes Take 1-2 tablets (325-650 mg total) by  mouth every 6 (six) hours as needed for mild pain (pain score 1-3 or temp > 100.5). Cammy Copa, MD Taking Active Spouse/Significant Other  ascorbic acid (VITAMIN C) 500 MG tablet [id number]  Take 500 mg by mouth daily. [provider]  Active Spouse/Significant Other  aspirin 81 MG chewable tablet [id number]  Chew 1 tablet (81 mg total) by mouth 2 (two) times daily. Magnant, Joycie Peek, PA-C  Active   cholecalciferol (VITAMIN D) 25 MCG (1000 UNIT) tablet [id number]  Take 1,000 Units by mouth daily. [provider]  Active Spouse/Significant Other  docusate sodium (COLACE) 100 MG capsule [id number] Yes Take 100 mg by mouth 2 (two) times daily as needed for mild constipation. [provider] Taking Active Spouse/Significant Other  fluticasone (FLONASE) 50 MCG/ACT nasal spray [id number]  Place 1 spray into both nostrils daily as needed for allergies or rhinitis. [provider]  Active Spouse/Significant Other  furosemide (LASIX) 20 MG tablet [id number]  Take 2 tablets (40 mg total) by mouth 2 (two) times daily. Gaston Islam., NP  Active Spouse/Significant Other  methocarbamol (ROBAXIN) 500 MG tablet [id number] Yes Take 1 tablet (500 mg total) by mouth every 8 (eight) hours as needed for muscle spasms. Magnant, Joycie Peek, PA-C Taking Active   oxyCODONE (OXY IR/ROXICODONE) 5 MG immediate release tablet [id number] Yes Take 1 tablet (5 mg total) by mouth every 6 (six) hours as needed for moderate pain (pain score 4-6). Magnant, Joycie Peek, PA-C Taking Active   polyethylene glycol (MIRALAX / GLYCOLAX) 17 g packet [id number] Yes Take 17 g by mouth daily. [provider] Taking Active Spouse/Significant Other  potassium chloride SA (KLOR-CON M) 20 MEQ tablet [id number]  Take 20 mEq by mouth daily. [provider]  Active Spouse/Significant Other  tamsulosin (FLOMAX) 0.4 MG CAPS  capsule [id number]  TAKE 1 CAPSULE BY MOUTH EVERY DAY Ardith Dark, MD  Active  Spouse/Significant Other  triamcinolone (KENALOG) 0.1 % paste [id number]  Apply to affected area 1-2 times daily  Patient taking differently: 1 application  daily as needed (mouth sores).   Jarold Motto, [place]  Active Spouse/Significant Other  vitamin B-12 (CYANOCOBALAMIN) 1000 MCG tablet [id number]  Take 1,000 mcg by mouth daily. [provider]  Active Spouse/Significant Other  Zinc 50 MG CAPS [id number]  Take 50 mg by mouth daily. [provider]  Active Spouse/Significant Other            Home Care and Equipment/Supplies: Were Home Health Services Ordered?: Yes Name of Home Health Agency:: [facility] Has Agency set up a time to come to your home?: Yes First Home Health Visit Date:  (Wife does not recall official first visit date but reports they have been coming to work with pt since discharge and made several visits) Any new equipment or medical supplies ordered?: No  Functional Questionnaire: Do you need assistance with bathing/showering or dressing?: Yes Do you need assistance with meal preparation?: Yes Do you need assistance with eating?: No Do you have difficulty maintaining continence: No Do you need assistance with getting out of bed/getting out of a chair/moving?: Yes Do you have difficulty managing or taking your medications?: No  Follow up appointments reviewed: PCP Follow-up appointment confirmed?: NA MD Provider Line Number:[phone] Given: No Specialist Hospital Follow-up appointment confirmed?: Yes Date of Specialist follow-up appointment?: 04/24/23 Follow-Up Specialty Provider:: Estevan Oaks Do you need transportation to your follow-up appointment?: No Do you understand care options if your condition(s) worsen?: Yes-patient verbalized understanding  SDOH Interventions Today    Flowsheet Row Most Recent Value  SDOH Interventions   Food Insecurity Interventions Intervention Not Indicated  Transportation Interventions Intervention Not  Indicated      TOC Interventions Today    Flowsheet Row Most Recent Value  TOC Interventions   TOC Interventions Discussed/Reviewed TOC Interventions Discussed, Post op wound/incision care, Post discharge activity limitations per provider      Interventions Today    Flowsheet Row Most Recent Value  General Interventions   General Interventions Discussed/Reviewed General Interventions Discussed, Doctor Visits  Doctor Visits Discussed/Reviewed Doctor Visits Discussed, PCP, Specialist  PCP/Specialist Visits Compliance with follow-up visit  Education Interventions   Education Provided Provided Education  Provided Verbal Education On When to see the doctor, Medication, Other  [pain mgmt/ bowel regimen mgmt]  Nutrition Interventions   Nutrition Discussed/Reviewed Nutrition Discussed  Pharmacy Interventions   Pharmacy Dicussed/Reviewed Pharmacy Topics Discussed, Medications and their functions  Safety Interventions   Safety Discussed/Reviewed Safety Discussed       Alessandra Grout [facility] Care Management Care Management Community Coordinator Direct Phone: [phone] Toll Free: [phone] Fax: [phone]

## 2023-04-19 NOTE — Telephone Encounter (Signed)
Patient called and is really struggling with this knee with pain management. He was taking Oxycodone 1-2 tablets every 4 hours after discharge, then [facility] refilled this week with dose of 1 tablet every 6 hours and he called stating he just can't do this b/c he can't get rest or work with therapy. Pain level stays a 6 or above her reports.He asked if there was anything else or could it be changed? He's trying to stretch them out as prescribed. 1 week post op. He is icing, elevating and swelling has started to decrease some. Recommendations?

## 2023-04-22 NOTE — Discharge Summary (Signed)
Physician Discharge Summary      Patient ID: Frank [name] MRN: [id number] DOB/AGE: Jan 03, 1951 72 y.o.  Admit date: 04/11/2023 Discharge date: 04/12/2023  Admission Diagnoses:  Principal Problem:   S/P total knee replacement, right   Discharge Diagnoses:  Same  Surgeries: Procedure(s): RIGHT TOTAL KNEE ARTHROPLASTY-CEMENTED on 04/11/2023   Consultants:   Discharged Condition: Stable  Hospital Course: DEACON GADBOIS is an 72 y.o. male who was admitted 04/11/2023 with a chief complaint of right knee pain, and found to have a diagnosis of right knee osteoarthritis.  They were brought to the operating room on 04/11/2023 and underwent the above named procedures.  Pt awoke from anesthesia without complication and was transferred to the floor. On POD1, patient's pain was overall controlled and he was able to perform stable straight leg raises he was able to ambulate 250 feet with physical therapy.  No red flag signs or symptoms throughout his stay.  Discharged home on POD 1..  Pt will f/u with Dr. August Saucer in clinic in ~2 weeks.   Antibiotics given:  Anti-infectives (From admission, onward)    Start     Dose/Rate Route Frequency Ordered Stop   04/12/23 0600  vancomycin (VANCOCIN) IVPB 1000 mg/200 mL premix        1,000 mg 200 mL/hr over 60 Minutes Intravenous Every 12 hours 04/11/23 2002 04/12/23 1759   04/12/23 0100  levofloxacin (LEVAQUIN) IVPB 500 mg        500 mg 100 mL/hr over 60 Minutes Intravenous  Once 04/11/23 2002 04/12/23 1755   04/11/23 1644  vancomycin (VANCOCIN) powder  Status:  Discontinued          As needed 04/11/23 1644 04/11/23 1844   04/11/23 1300  ceFAZolin (ANCEF) IVPB 2g/100 mL premix        2 g 200 mL/hr over 30 Minutes Intravenous On call to O.R. 04/11/23 1244 04/11/23 1611     .  Recent vital signs:  Vitals:   04/12/23 0714 04/12/23 1538  BP: 119/78 114/78  Pulse: 72 79  Resp: 16 16  Temp: 98 F (36.7 C) (!) 97.5 F (36.4 C)  SpO2: 100% 100%     Recent laboratory studies:  Results for orders placed or performed during the hospital encounter of 04/04/23  Surgical pcr screen   Specimen: Nasal Mucosa; Nasal Swab  Result Value Ref Range   MRSA, PCR NEGATIVE NEGATIVE   Staphylococcus aureus NEGATIVE NEGATIVE  CBC  Result Value Ref Range   WBC 6.5 4.0 - 10.5 K/uL   RBC 4.72 4.22 - 5.81 MIL/uL   Hemoglobin 14.4 13.0 - 17.0 g/dL   HCT 40.9 81.1 - 91.4 %   MCV 93.4 80.0 - 100.0 fL   MCH 30.5 26.0 - 34.0 pg   MCHC 32.7 30.0 - 36.0 g/dL   RDW 78.2 95.6 - 21.3 %   Platelets 235 150 - 400 K/uL   nRBC 0.0 0.0 - 0.2 %  Basic metabolic panel  Result Value Ref Range   Sodium 142 135 - 145 mmol/L   Potassium 3.9 3.5 - 5.1 mmol/L   Chloride 106 98 - 111 mmol/L   CO2 27 22 - 32 mmol/L   Glucose, Bld 97 70 - 99 mg/dL   BUN 21 8 - 23 mg/dL   Creatinine, Ser 0.86 0.61 - 1.24 mg/dL   Calcium 9.2 8.9 - 57.8 mg/dL   GFR, Estimated >46 >96 mL/min   Anion gap 9 5 - 15  Urinalysis, w/ Reflex  to Culture (Infection Suspected) -Urine, Clean Catch  Result Value Ref Range   Specimen Source URINE, CLEAN CATCH    Color, Urine STRAW (A) YELLOW   APPearance CLEAR CLEAR   Specific Gravity, Urine 1.010 1.005 - 1.030   pH 6.0 5.0 - 8.0   Glucose, UA NEGATIVE NEGATIVE mg/dL   Hgb urine dipstick NEGATIVE NEGATIVE   Bilirubin Urine NEGATIVE NEGATIVE   Ketones, ur NEGATIVE NEGATIVE mg/dL   Protein, ur NEGATIVE NEGATIVE mg/dL   Nitrite NEGATIVE NEGATIVE   Leukocytes,Ua NEGATIVE NEGATIVE   RBC / HPF 0-5 0 - 5 RBC/hpf   WBC, UA 0-5 0 - 5 WBC/hpf   Bacteria, UA NONE SEEN NONE SEEN   Squamous Epithelial / HPF 0-5 0 - 5 /HPF   Mucus PRESENT    Hyaline Casts, UA PRESENT     Discharge Medications:   Allergies as of 04/12/2023       Reactions   Meloxicam Hives, Shortness Of Breath   Atorvastatin Other (See Comments)   Joint pain   Metoprolol Rash   Weakness / fatigue    Tomato    Mouth breaks out        Medication List     STOP taking  these medications    baclofen 10 MG tablet Commonly known as: LIORESAL   colchicine 0.6 MG tablet   docusate sodium 100 MG capsule Commonly known as: COLACE   HYDROcodone-acetaminophen 5-325 MG tablet Commonly known as: NORCO/VICODIN   meloxicam 15 MG tablet Commonly known as: MOBIC   traMADol 50 MG tablet Commonly known as: ULTRAM       TAKE these medications    acetaminophen 325 MG tablet Commonly known as: TYLENOL Take 1-2 tablets (325-650 mg total) by mouth every 6 (six) hours as needed for mild pain (pain score 1-3 or temp > 100.5).   ascorbic acid 500 MG tablet Commonly known as: VITAMIN C Take 500 mg by mouth daily.   aspirin 81 MG chewable tablet Chew 1 tablet (81 mg total) by mouth 2 (two) times daily. What changed: when to take this   cholecalciferol 25 MCG (1000 UNIT) tablet Commonly known as: VITAMIN D3 Take 1,000 Units by mouth daily.   cyanocobalamin 1000 MCG tablet Commonly known as: VITAMIN B12 Take 1,000 mcg by mouth daily.   fluticasone 50 MCG/ACT nasal spray Commonly known as: FLONASE Place 1 spray into both nostrils daily as needed for allergies or rhinitis.   furosemide 20 MG tablet Commonly known as: LASIX Take 2 tablets (40 mg total) by mouth 2 (two) times daily.   potassium chloride SA 20 MEQ tablet Commonly known as: KLOR-CON M Take 20 mEq by mouth daily.   tamsulosin 0.4 MG Caps capsule Commonly known as: FLOMAX TAKE 1 CAPSULE BY MOUTH EVERY DAY   triamcinolone 0.1 % paste Commonly known as: KENALOG Apply to affected area 1-2 times daily What changed:  how much to take when to take this reasons to take this additional instructions   Zinc 50 MG Caps Take 50 mg by mouth daily.        Diagnostic Studies: No results found.  Disposition: Discharge disposition: 01-Home or Self Care       Discharge Instructions     Call MD / Call 911   Complete by: As directed    If you experience chest pain or shortness of  breath, CALL 911 and be transported to the hospital emergency room.  If you develope a fever above 101 F, pus (  white drainage) or increased drainage or redness at the wound, or calf pain, call your surgeon's office.   Constipation Prevention   Complete by: As directed    Drink plenty of fluids.  Prune juice may be helpful.  You may use a stool softener, such as Colace (over the counter) 100 mg twice a day.  Use MiraLax (over the counter) for constipation as needed.   Diet - low sodium heart healthy   Complete by: As directed    Discharge instructions   Complete by: As directed    You may shower, dressing is waterproof.  Do not remove the dressing, we will remove it at your first post-op appointment.  Do not take a bath or soak the knee in a tub or pool.  You may weightbear as you can tolerate on the operative leg with a walker.  Continue using the CPM machine 3 times per day for one hour each time, increasing the degrees of range of motion daily.  Use the blue cradle boot under your heel to work on getting your leg straight.  Do NOT put a pillow under your knee.  You will follow-up with Dr. August Saucer in the clinic in 2 weeks at your given appointment date.    INSTRUCTIONS AFTER JOINT REPLACEMENT   Remove items at home which could result in a fall. This includes throw rugs or furniture in walking pathways ICE to the affected joint every three hours while awake for 30 minutes at a time, for at least the first 3-5 days, and then as needed for pain and swelling.  Continue to use ice for pain and swelling. You may notice swelling that will progress down to the foot and ankle.  This is normal after surgery.  Elevate your leg when you are not up walking on it.   Continue to use the breathing machine you got in the hospital (incentive spirometer) which will help keep your temperature down.  It is common for your temperature to cycle up and down following surgery, especially at night when you are not up moving  around and exerting yourself.  The breathing machine keeps your lungs expanded and your temperature down.   DIET:  As you were doing prior to hospitalization, we recommend a well-balanced diet.  DRESSING / WOUND CARE / SHOWERING  Keep the surgical dressing until follow up.  The dressing is water proof, so you can shower without any extra covering.  IF THE DRESSING FALLS OFF or the wound gets wet inside, change the dressing with sterile gauze.  Please use good hand washing techniques before changing the dressing.  Do not use any lotions or creams on the incision until instructed by your surgeon.    ACTIVITY  Increase activity slowly as tolerated, but follow the weight bearing instructions below.   No driving for 6 weeks or until further direction given by your physician.  You cannot drive while taking narcotics.  No lifting or carrying greater than 10 lbs. until further directed by your surgeon. Avoid periods of inactivity such as sitting longer than an hour when not asleep. This helps prevent blood clots.  You may return to work once you are authorized by your doctor.     WEIGHT BEARING   Weight bearing as tolerated with assist device (walker, cane, etc) as directed, use it as long as suggested by your surgeon or therapist, typically at least 4-6 weeks.   EXERCISES  Results after joint replacement surgery are often greatly improved when  you follow the exercise, range of motion and muscle strengthening exercises prescribed by your doctor. Safety measures are also important to protect the joint from further injury. Any time any of these exercises cause you to have increased pain or swelling, decrease what you are doing until you are comfortable again and then slowly increase them. If you have problems or questions, call your caregiver or physical therapist for advice.   Rehabilitation is important following a joint replacement. After just a few days of immobilization, the muscles of the leg  can become weakened and shrink (atrophy).  These exercises are designed to build up the tone and strength of the thigh and leg muscles and to improve motion. Often times heat used for twenty to thirty minutes before working out will loosen up your tissues and help with improving the range of motion but do not use heat for the first two weeks following surgery (sometimes heat can increase post-operative swelling).   These exercises can be done on a training (exercise) mat, on the floor, on a table or on a bed. Use whatever works the best and is most comfortable for you.    Use music or television while you are exercising so that the exercises are a pleasant break in your day. This will make your life better with the exercises acting as a break in your routine that you can look forward to.   Perform all exercises about fifteen times, three times per day or as directed.  You should exercise both the operative leg and the other leg as well.  Exercises include:   Quad Sets - Tighten up the muscle on the front of the thigh (Quad) and hold for 5-10 seconds.   Straight Leg Raises - With your knee straight (if you were given a brace, keep it on), lift the leg to 60 degrees, hold for 3 seconds, and slowly lower the leg.  Perform this exercise against resistance later as your leg gets stronger.  Leg Slides: Lying on your back, slowly slide your foot toward your buttocks, bending your knee up off the floor (only go as far as is comfortable). Then slowly slide your foot back down until your leg is flat on the floor again.  Angel Wings: Lying on your back spread your legs to the side as far apart as you can without causing discomfort.  Hamstring Strength:  Lying on your back, push your heel against the floor with your leg straight by tightening up the muscles of your buttocks.  Repeat, but this time bend your knee to a comfortable angle, and push your heel against the floor.  You may put a pillow under the heel to make  it more comfortable if necessary.   A rehabilitation program following joint replacement surgery can speed recovery and prevent re-injury in the future due to weakened muscles. Contact your doctor or a physical therapist for more information on knee rehabilitation.    CONSTIPATION  Constipation is defined medically as fewer than three stools per week and severe constipation as less than one stool per week.  Even if you have a regular bowel pattern at home, your normal regimen is likely to be disrupted due to multiple reasons following surgery.  Combination of anesthesia, postoperative narcotics, change in appetite and fluid intake all can affect your bowels.   YOU MUST use at least one of the following options; they are listed in order of increasing strength to get the job done.  They are all available  over the counter, and you may need to use some, POSSIBLY even all of these options:    Drink plenty of fluids (prune juice may be helpful) and high fiber foods Colace 100 mg by mouth twice a day  Senokot for constipation as directed and as needed Dulcolax (bisacodyl), take with full glass of water  Miralax (polyethylene glycol) once or twice a day as needed.  If you have tried all these things and are unable to have a bowel movement in the first 3-4 days after surgery call either your surgeon or your primary doctor.    If you experience loose stools or diarrhea, hold the medications until you stool forms back up.  If your symptoms do not get better within 1 week or if they get worse, check with your doctor.  If you experience "the worst abdominal pain ever" or develop nausea or vomiting, please contact the office immediately for further recommendations for treatment.   ITCHING:  If you experience itching with your medications, try taking only a single pain pill, or even half a pain pill at a time.  You can also use Benadryl over the counter for itching or also to help with sleep.   TED HOSE  STOCKINGS:  Use stockings on both legs until for at least 2 weeks or as directed by physician office. They may be removed at night for sleeping.  MEDICATIONS:  See your medication summary on the "After Visit Summary" that nursing will review with you.  You may have some home medications which will be placed on hold until you complete the course of blood thinner medication.  It is important for you to complete the blood thinner medication as prescribed.  PRECAUTIONS:  If you experience chest pain or shortness of breath - call 911 immediately for transfer to the hospital emergency department.   If you develop a fever greater that 101 F, purulent drainage from wound, increased redness or drainage from wound, foul odor from the wound/dressing, or calf pain - CONTACT YOUR SURGEON.                                                   FOLLOW-UP APPOINTMENTS:  If you do not already have a post-op appointment, please call the office for an appointment to be seen by your surgeon.  Guidelines for how soon to be seen are listed in your "After Visit Summary", but are typically between 1-4 weeks after surgery.  OTHER INSTRUCTIONS:   Knee Replacement:  Do not place pillow under knee, focus on keeping the knee straight while resting. CPM instructions: 0-90 degrees, 2 hours in the morning, 2 hours in the afternoon, and 2 hours in the evening. Place foam block, curve side up under heel at all times except when in CPM or when walking.  DO NOT modify, tear, cut, or change the foam block in any way.  POST-OPERATIVE OPIOID TAPER INSTRUCTIONS: It is important to wean off of your opioid medication as soon as possible. If you do not need pain medication after your surgery it is ok to stop day one. Opioids include: Codeine, Hydrocodone(Norco, Vicodin), Oxycodone(Percocet, oxycontin) and hydromorphone amongst others.  Long term and even short term use of opiods can cause: Increased pain  response Dependence Constipation Depression Respiratory depression And more.  Withdrawal symptoms can include Flu like symptoms Nausea, vomiting  And more Techniques to manage these symptoms Hydrate well Eat regular healthy meals Stay active Use relaxation techniques(deep breathing, meditating, yoga) Do Not substitute Alcohol to help with tapering If you have been on opioids for less than two weeks and do not have pain than it is ok to stop all together.  Plan to wean off of opioids This plan should start within one week post op of your joint replacement. Maintain the same interval or time between taking each dose and first decrease the dose.  Cut the total daily intake of opioids by one tablet each day Next start to increase the time between doses. The last dose that should be eliminated is the evening dose.   MAKE SURE YOU:  Understand these instructions.  Get help right away if you are not doing well or get worse.    Thank you for letting us be a part of your medical care team.  It is a privilege we respect greatly.  We hope these instructions will help you stay on track for a fast and full recovery!    Dental Antibiotics:  In most cases prophylactic antibiotics for Dental procdeures after total joint surgery are not necessary.  Exceptions are as follows:  1. History of prior total joint infection  2. Severely immunocompromised (Organ Transplant, cancer chemotherapy, Rheumatoid biologic meds such as Humera)  3. Poorly controlled diabetes (A1C &gt; 8.0, blood glucose over 200)  If you have one of these conditions, contact your surgeon for an antibiotic prescription, prior to your dental procedure.   Increase activity slowly as tolerated   Complete by: As directed    Post-operative opioid taper instructions:   Complete by: As directed    POST-OPERATIVE OPIOID TAPER INSTRUCTIONS: It is important to wean off of your opioid medication as soon as possible. If you do  not need pain medication after your surgery it is ok to stop day one. Opioids include: Codeine, Hydrocodone(Norco, Vicodin), Oxycodone(Percocet, oxycontin) and hydromorphone amongst others.  Long term and even short term use of opiods can cause: Increased pain response Dependence Constipation Depression Respiratory depression And more.  Withdrawal symptoms can include Flu like symptoms Nausea, vomiting And more Techniques to manage these symptoms Hydrate well Eat regular healthy meals Stay active Use relaxation techniques(deep breathing, meditating, yoga) Do Not substitute Alcohol to help with tapering If you have been on opioids for less than two weeks and do not have pain than it is ok to stop all together.  Plan to wean off of opioids This plan should start within one week post op of your joint replacement. Maintain the same interval or time between taking each dose and first decrease the dose.  Cut the total daily intake of opioids by one tablet each day Next start to increase the time between doses. The last dose that should be eliminated is the evening dose.           Follow-up Information     August Saucer, Corrie Mckusick, MD. Go on 04/24/2023.   Specialty: Orthopedic Surgery Why: at 1:45 pm for your first in office appointment with Harriette Bouillon, PA-C "[facility] information: [address] [place] [address] [phone]         [facility] Follow up.   Why: Iantha Fallen)- HHPT arranged- they will contact you to schedule Contact information: [address] [place] [address] [phone]  SignedKarenann Cai 04/22/2023, 9:29 AM

## 2023-04-23 DIAGNOSIS — J45909 Unspecified asthma, uncomplicated: Secondary | ICD-10-CM | POA: Diagnosis not present

## 2023-04-23 DIAGNOSIS — Z96651 Presence of right artificial knee joint: Secondary | ICD-10-CM | POA: Diagnosis not present

## 2023-04-23 DIAGNOSIS — I2510 Atherosclerotic heart disease of native coronary artery without angina pectoris: Secondary | ICD-10-CM | POA: Diagnosis not present

## 2023-04-23 DIAGNOSIS — Z471 Aftercare following joint replacement surgery: Secondary | ICD-10-CM | POA: Diagnosis not present

## 2023-04-23 DIAGNOSIS — I119 Hypertensive heart disease without heart failure: Secondary | ICD-10-CM | POA: Diagnosis not present

## 2023-04-23 DIAGNOSIS — E785 Hyperlipidemia, unspecified: Secondary | ICD-10-CM | POA: Diagnosis not present

## 2023-04-23 DIAGNOSIS — K219 Gastro-esophageal reflux disease without esophagitis: Secondary | ICD-10-CM | POA: Diagnosis not present

## 2023-04-24 ENCOUNTER — Ambulatory Visit: Payer: Medicare Other | Admitting: Physical Therapy

## 2023-04-24 ENCOUNTER — Ambulatory Visit (INDEPENDENT_AMBULATORY_CARE_PROVIDER_SITE_OTHER): Payer: Medicare Other | Admitting: Surgical

## 2023-04-24 ENCOUNTER — Telehealth: Payer: Self-pay | Admitting: *Deleted

## 2023-04-24 ENCOUNTER — Encounter: Payer: Self-pay | Admitting: Physical Therapy

## 2023-04-24 ENCOUNTER — Other Ambulatory Visit: Payer: Self-pay

## 2023-04-24 ENCOUNTER — Ambulatory Visit (INDEPENDENT_AMBULATORY_CARE_PROVIDER_SITE_OTHER): Payer: Medicare Other

## 2023-04-24 DIAGNOSIS — R262 Difficulty in walking, not elsewhere classified: Secondary | ICD-10-CM | POA: Diagnosis not present

## 2023-04-24 DIAGNOSIS — M25561 Pain in right knee: Secondary | ICD-10-CM | POA: Diagnosis not present

## 2023-04-24 DIAGNOSIS — Z96651 Presence of right artificial knee joint: Secondary | ICD-10-CM

## 2023-04-24 DIAGNOSIS — R600 Localized edema: Secondary | ICD-10-CM | POA: Diagnosis not present

## 2023-04-24 DIAGNOSIS — M6281 Muscle weakness (generalized): Secondary | ICD-10-CM | POA: Diagnosis not present

## 2023-04-24 MED ORDER — BACLOFEN 10 MG PO TABS: 10.0000 mg | ORAL_TABLET | Freq: Three times a day (TID) | ORAL | 0 refills | Status: DC

## 2023-04-24 MED ORDER — HYDROCODONE-ACETAMINOPHEN 5-325 MG PO TABS: 1.0000 | ORAL_TABLET | ORAL | 0 refills | Status: DC | PRN

## 2023-04-24 NOTE — Therapy (Signed)
OUTPATIENT PHYSICAL THERAPY LOWER EXTREMITY EVALUATION   Patient Name: Frank [name] MRN: [id number] DOB:[birth year], 72 y.o., male Today's Date: 04/24/2023  END OF SESSION:  PT End of Session - 04/24/23 1441     Visit Number 1    Number of Visits 15    Date for PT Re-Evaluation 06/19/23    Authorization Type UHC MCR    Progress Note Due on Visit 10    PT Start Time 1510    PT Stop Time 1550    PT Time Calculation (min) 40 min    Activity Tolerance Patient tolerated treatment well    Behavior During Therapy [facility] for tasks assessed/performed             Past Medical History:  Diagnosis Date   Allergy    Asthma    Coronary artery disease    GERD (gastroesophageal reflux disease)    Headache(784.0)    History of kidney stones    Hypertension    Osteoarthritis of knee    right   Past Surgical History:  Procedure Laterality Date   CARDIAC CATHETERIZATION     HERNIA REPAIR  1969, 2009   kidney stones  05/20/1995   KNEE SURGERY  02/26/1989   MITRAL VALVE REPAIR  01/02/2022   NASAL SINUS SURGERY  10/24/1992   RIGHT/LEFT HEART CATH AND CORONARY ANGIOGRAPHY N/A 11/15/2021   Procedure: RIGHT/LEFT HEART CATH AND CORONARY ANGIOGRAPHY;  Surgeon: Orbie Pyo, MD;  Location: [facility] IN[facility];  Service: Cardiovascular;  Laterality: N/A;   ROTATOR CUFF REPAIR Left 04/25/1993   TEE WITHOUT CARDIOVERSION N/A 08/24/2021   Procedure: TRANSESOPHAGEAL ECHOCARDIOGRAM (TEE);  Surgeon: Jodelle Red, MD;  Location: [facility] ENDOSCOPY;  Service: Cardiovascular;  Laterality: N/A;   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   Procedure: LEFT TOTAL KNEE ARTHROPLASTY;  Surgeon: Cammy Copa, MD;  Location: [facility] OR;  Service: Orthopedics;  Laterality: Left;   TOTAL KNEE ARTHROPLASTY Right 04/11/2023   Procedure: RIGHT TOTAL KNEE ARTHROPLASTY-CEMENTED;  Surgeon: Cammy Copa, MD;  Location: [facility] OR;  Service: Orthopedics;  Laterality: Right;   Patient Active Problem List    Diagnosis Date Noted   S/P total knee replacement, 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   Arthritis of knee 12/18/2022   Nonrheumatic mitral valve regurgitation s/p repair 2023    Peroneal tendinitis of right lower extremity 04/25/2021   Benign localized prostatic hyperplasia with lower urinary tract symptoms (LUTS) 09/27/2020   Wheezing 09/14/2020   Chondrocalcinosis 03/14/2020   Dyslipidemia 09/23/2017   Bilateral primary osteoarthritis of knee 09/02/2017   Neck pain 02/15/2017   Primary osteoarthritis of right hip 02/15/2017   Tear of right acetabular labrum 02/15/2017   Right leg pain 01/04/2017   Obese 08/17/2015   Elevated blood sugar 08/17/2015   Shingles 09/10/2014   Dyspnea 11/27/2011   HERNIA, UMBILICAL 08/02/2008   Essential hypertension 01/30/2008   Allergic rhinitis 01/30/2008   GERD 01/30/2008   HEADACHE 01/30/2008    PCP: Ardith Dark, MD   REFERRING PROVIDER: Cammy Copa, MD  REFERRING DIAG: [phone] (ICD-10-CM) - S/P total knee replacement, right  THERAPY DIAG:  Acute pain of right knee  Muscle weakness (generalized)  Difficulty walking  Localized edema  Rationale for Evaluation and Treatment: Rehabilitation  ONSET DATE: S/P Right total knee arthroplasty on 04/11/23  SUBJECTIVE:   SUBJECTIVE STATEMENT: He had Rt TKA and has associated pain, swelling, cramp and spasm. He has big bruise around thigh where tourniquet was.   PERTINENT HISTORY: R TKA,6/13, asthma,  CAD, GERD, headaches, HTN.  PAIN:  Are you having pain? Yes: NPRS scale: 7/10 Pain location: Rt knee sides and down shin bone Pain description: cramp, spasm Aggravating factors: extending knee and turning foot, flexing knee Relieving factors: ice, rest  PRECAUTIONS: None  WEIGHT BEARING RESTRICTIONS: No  FALLS:  Has patient fallen in last 6 months? No  LIVING ENVIRONMENT: Has 3 steps to enter, he says he can do these without too much difficulty  OCCUPATION: retired  PLOF:  Independent  PATIENT GOALS: get back to walking normal, hike again  OBJECTIVE:   DIAGNOSTIC FINDINGS:  New XR taken 04/24/23, no report was available at time of eval.  PATIENT SURVEYS:  Eval: FOTO 49% functional intake, goal is 60%  COGNITION: Overall cognitive status: Within functional limits for tasks assessed     SENSATION: WFL  EDEMA:  Moderate swelling in Rt knee/leg  MUSCLE LENGTH: Hamstrings: Right is tight Maisie Fus test: Right is tight   LOWER EXTREMITY ROM:  Active /Passsive ROM Right eval   Hip flexion    Hip extension    Hip abduction    Hip adduction    Hip internal rotation    Hip external rotation    Knee flexion 98/103   Knee extension 5/4   Ankle dorsiflexion    Ankle plantarflexion    Ankle inversion    Ankle eversion     (Blank rows = not tested)  LOWER EXTREMITY MMT:  MMT in sitting Right eval Left eval  Hip flexion 4   Hip extension    Hip abduction 4   Hip adduction    Hip internal rotation    Hip external rotation    Knee flexion 4+   Knee extension 4+   Ankle dorsiflexion    Ankle plantarflexion    Ankle inversion    Ankle eversion     (Blank rows = not tested)  LOWER EXTREMITY SPECIAL TESTS:    FUNCTIONAL TESTS:  Timed up and go (TUG): 16.85   GAIT:  Eval: Distance walked: 100 feet X 2  Comments: arrives with rollator but was able to ambulate without AD but with supervision and antalgic gait on Rt with decreased hip/knee flexion during swing phase   TODAY'S TREATMENT:  Eval HEP creation and review with demonstration and trial set preformed, see below for details Rt knee PROM with overpressure to tolerance Sci fit bike L3 X 5 min full revolutions, precor bike rocking for ROM 2 min -Vasopnuematic device X 10 min, medium compression, 34 deg to Rt knee     PATIENT EDUCATION: Education details: HEP, PT plan of care Person educated: Patient Education method: Explanation, Demonstration, Verbal cues, and  Handouts Education comprehension: verbalized understanding and needs further education   HOME EXERCISE PROGRAM: Access Code: [email or web address] URL: [email or web address] Date: 04/24/2023 Prepared by: Ivery Quale  Exercises - Supine Heel Slide with Strap  - 2 x daily - 6 x weekly - 1 sets - 10 reps - 5 hold - Supine Quad Set  - 2 x daily - 6 x weekly - 2 sets - 10 reps - 5 hold - Seated Straight Leg Raise with Quad Contraction  - 2 x daily - 6 x weekly - 2-3 sets - 10 reps - Mini Squat with Chair  - 2 x daily - 6 x weekly - 1-2 sets - 10 reps - Seated Knee Flexion Stretch  - 2 x daily - 6 x weekly - 1-2 sets - 10 reps - 5 sec hold  ASSESSMENT:  CLINICAL IMPRESSION: Patient referred to PT S/P Right total knee arthroplasty on 04/11/23. He is doing quite well up to this point with is strength and ROM but will need to also improve these to maximize his function. Patient will benefit from skilled PT to address below impairments, limitations and improve overall function.  OBJECTIVE IMPAIRMENTS: decreased activity tolerance, difficulty walking, decreased balance, decreased endurance, decreased mobility, decreased ROM, decreased strength, impaired flexibility, impaired LE use, and pain.  ACTIVITY LIMITATIONS: bending, lifting, carry, locomotion, cleaning, community activity, driving,   PERSONAL FACTORS: R TKA,6/13, asthma, CAD, GERD, headaches, HTN.  are also affecting patient's functional outcome.  REHAB POTENTIAL: Good  CLINICAL DECISION MAKING: Stable/uncomplicated  EVALUATION COMPLEXITY: Low    GOALS: Short term PT Goals Target date: 05/22/2023   Pt will be I and compliant with HEP. Baseline:  Goal status: New Pt will decrease pain by 25% overall Baseline: Goal status: New  Long term PT goals Target date:06/19/2023   Pt will improve Rt knee PROM 0-110 deg and AROM to  [facility] to improve functional mobility Baseline: Goal status: New Pt will improve  hip/knee strength to  at least 5-/5 MMT to improve functional strength Baseline: Goal status: New Pt will improve FOTO to at least 60% functional to show improved function Baseline: Goal status: New Pt will reduce pain to overall less than 2-3/10 with usual activity and work activity. Baseline: Goal status: New Pt will be able to ambulate community distances at least 1000 ft WNL gait pattern without complaints Baseline: Goal status: New  PLAN: PT FREQUENCY: 1-3 times per week   PT DURATION: 6-8 weeks  PLANNED INTERVENTIONS (unless contraindicated): aquatic PT, Canalith repositioning, cryotherapy, Electrical stimulation, Iontophoresis with 4 mg/ml dexamethasome, Moist heat, traction, Ultrasound, gait training, Therapeutic exercise, balance training, neuromuscular re-education, patient/family education, prosthetic training, manual techniques, passive ROM, dry needling, taping, vasopnuematic device, vestibular, spinal manipulations, joint manipulations  PLAN FOR NEXT SESSION: review HEP, knee ROM and quad activation as tolerated. NEXT MD VISIT: 05/22/23   April Manson, PT,DPT 04/24/2023, 2:52 PM

## 2023-04-24 NOTE — Telephone Encounter (Signed)
Ortho bundle 14 day in office visit completed. 

## 2023-04-25 ENCOUNTER — Encounter: Payer: Self-pay | Admitting: Surgical

## 2023-04-25 ENCOUNTER — Other Ambulatory Visit: Payer: Self-pay | Admitting: Nurse Practitioner

## 2023-04-25 NOTE — Progress Notes (Signed)
Post-Op Visit Note   [name]           Date of Birth: [birth year]           MRN: [id number] Visit Date: 04/24/2023 PCP: Frank Dark, MD   Assessment & Plan:  Chief Complaint:  Chief Complaint  Patient presents with   Right Knee - Routine Post Op   Visit Diagnoses:  1. S/P total knee replacement, right     Plan: Frank [name] is a 72 y.o. male who presents s/p right total knee arthroplasty on 04/11/2023.  Doing well overall.  Using CPM machine.  They deny any calf pain, shortness of breath, chest pain, abdominal pain.  Pain is overall controlled and taking oxycodone and methocarbamol for pain control.  Taking aspirin for DVT prophylaxis.  Ambulating with walker.   On exam, patient has range of motion 5 degrees extension to 95 degrees of knee flexion.  Incision is healing well without evidence of infection or dehiscence.  2+ DP pulse of the operative extremity.  No calf tenderness, negative Homans' sign.  Able to perform straight leg raise.  Intact ankle dorsiflexion.  Plan is to start physical therapy upstairs.  No evidence of infection or DVT/PE on exam today.  Does have some difficulty with pain control so we will plan to try Flexeril and hydrocodone which worked for him for the last knee replacement and he will keep [place] posted on how this does.  See him back in 4 weeks for clinical recheck with Dr. August [name]..    Follow-Up Instructions: No follow-ups on file.   Orders:  Orders Placed This Encounter  Procedures   XR KNEE 3 VIEW RIGHT   Ambulatory referral to Physical Therapy   Meds ordered this encounter  Medications   HYDROcodone-acetaminophen (NORCO/VICODIN) 5-325 MG tablet    Sig: Take 1 tablet by mouth every 4 (four) hours as needed for moderate pain.    Dispense:  30 tablet    Refill:  0   baclofen (LIORESAL) 10 MG tablet    Sig: Take 1 tablet (10 mg total) by mouth 3 (three) times daily.    Dispense:  30 each    Refill:  0    Imaging: No results  found.  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   Arthritis of knee 12/18/2022   Nonrheumatic mitral valve regurgitation s/p repair 2023    Peroneal tendinitis of right lower extremity 04/25/2021   Benign localized prostatic hyperplasia with lower urinary tract symptoms (LUTS) 09/27/2020   Wheezing 09/14/2020   Chondrocalcinosis 03/14/2020   Dyslipidemia 09/23/2017   Bilateral primary osteoarthritis of knee 09/02/2017   Neck pain 02/15/2017   Primary osteoarthritis of right hip 02/15/2017   Tear of right acetabular labrum 02/15/2017   Right leg pain 01/04/2017   Obese 08/17/2015   Elevated blood sugar 08/17/2015   Shingles 09/10/2014   Dyspnea 11/27/2011   HERNIA, UMBILICAL 08/02/2008   Essential hypertension 01/30/2008   Allergic rhinitis 01/30/2008   GERD 01/30/2008   HEADACHE 01/30/2008   Past Medical History:  Diagnosis Date   Allergy    Asthma    Coronary artery disease    GERD (gastroesophageal reflux disease)    Headache(784.0)    History of kidney stones    Hypertension    Osteoarthritis of knee    right    Family History  Problem Relation Age of Onset  Kidney disease Mother    COPD Father    Pneumonia Father    Cardiomyopathy Brother    Hypertension Sister    Heart disease Brother    Hypertension Brother    Hypertension Brother    Hypertension Sister    Diabetes Sister    Osteoarthritis Sister     Past Surgical History:  Procedure Laterality Date   CARDIAC CATHETERIZATION     HERNIA REPAIR  1969, 2009   kidney stones  05/20/1995   KNEE SURGERY  02/26/1989   MITRAL VALVE REPAIR  01/02/2022   NASAL SINUS SURGERY  10/24/1992   RIGHT/LEFT HEART CATH AND CORONARY ANGIOGRAPHY N/A 11/15/2021   Procedure: RIGHT/LEFT HEART CATH AND CORONARY ANGIOGRAPHY;  Surgeon: Frank Pyo, MD;  Location: [facility] INVASIVE CV LAB;  Service: Cardiovascular;  Laterality: N/A;   ROTATOR CUFF REPAIR  Left 04/25/1993   TEE WITHOUT CARDIOVERSION N/A 08/24/2021   Procedure: TRANSESOPHAGEAL ECHOCARDIOGRAM (TEE);  Surgeon: Frank Red, MD;  Location: [facility] ENDOSCOPY;  Service: Cardiovascular;  Laterality: N/A;   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   Procedure: LEFT TOTAL KNEE ARTHROPLASTY;  Surgeon: Frank Copa, MD;  Location: [facility] OR;  Service: Orthopedics;  Laterality: Left;   TOTAL KNEE ARTHROPLASTY Right 04/11/2023   Procedure: RIGHT TOTAL KNEE ARTHROPLASTY-CEMENTED;  Surgeon: Frank Copa, MD;  Location: [facility] OR;  Service: Orthopedics;  Laterality: Right;   Social History   Occupational History   Not on file  Tobacco Use   Smoking status: Former    Types: Cigarettes    Quit date: 1972    Years since quitting: 52.5   Smokeless tobacco: Never   Tobacco comments:    quit in 1972  Vaping Use   Vaping Use: Never used  Substance and Sexual Activity   Alcohol use: No   Drug use: No   Sexual activity: Not on file

## 2023-04-26 ENCOUNTER — Encounter: Payer: Self-pay | Admitting: Physical Therapy

## 2023-04-26 ENCOUNTER — Encounter: Payer: Medicare Other | Admitting: Surgical

## 2023-04-26 ENCOUNTER — Ambulatory Visit: Payer: Medicare Other | Admitting: Physical Therapy

## 2023-04-26 ENCOUNTER — Other Ambulatory Visit: Payer: Self-pay | Admitting: Family Medicine

## 2023-04-26 DIAGNOSIS — R262 Difficulty in walking, not elsewhere classified: Secondary | ICD-10-CM

## 2023-04-26 DIAGNOSIS — M6281 Muscle weakness (generalized): Secondary | ICD-10-CM | POA: Diagnosis not present

## 2023-04-26 DIAGNOSIS — M25561 Pain in right knee: Secondary | ICD-10-CM

## 2023-04-26 DIAGNOSIS — R600 Localized edema: Secondary | ICD-10-CM

## 2023-04-26 NOTE — Therapy (Signed)
OUTPATIENT PHYSICAL THERAPY TREATMENT   Patient Name: Frank [name] MRN: [id number] DOB:15-Jul-1951, 72 y.o., male Today's Date: 04/26/2023  END OF SESSION:  PT End of Session - 04/26/23 0844     Visit Number 2    Number of Visits 15    Date for PT Re-Evaluation 06/19/23    Authorization Type UHC MCR    Progress Note Due on Visit 10    PT Start Time 0840    PT Stop Time 0931    PT Time Calculation (min) 51 min    Activity Tolerance Patient tolerated treatment well    Behavior During Therapy [facility] for tasks assessed/performed              Past Medical History:  Diagnosis Date   Allergy    Asthma    Coronary artery disease    GERD (gastroesophageal reflux disease)    Headache(784.0)    History of kidney stones    Hypertension    Osteoarthritis of knee    right   Past Surgical History:  Procedure Laterality Date   CARDIAC CATHETERIZATION     HERNIA REPAIR  1969, 2009   kidney stones  05/20/1995   KNEE SURGERY  02/26/1989   MITRAL VALVE REPAIR  01/02/2022   NASAL SINUS SURGERY  10/24/1992   RIGHT/LEFT HEART CATH AND CORONARY ANGIOGRAPHY N/A 11/15/2021   Procedure: RIGHT/LEFT HEART CATH AND CORONARY ANGIOGRAPHY;  Surgeon: Orbie Pyo, MD;  Location: [facility] INVASIVE CV LAB;  Service: Cardiovascular;  Laterality: N/A;   ROTATOR CUFF REPAIR Left 04/25/1993   TEE WITHOUT CARDIOVERSION N/A 08/24/2021   Procedure: TRANSESOPHAGEAL ECHOCARDIOGRAM (TEE);  Surgeon: Jodelle Red, MD;  Location: [facility] ENDOSCOPY;  Service: Cardiovascular;  Laterality: N/A;   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   Procedure: LEFT TOTAL KNEE ARTHROPLASTY;  Surgeon: Cammy Copa, MD;  Location: [facility] OR;  Service: Orthopedics;  Laterality: Left;   TOTAL KNEE ARTHROPLASTY Right 04/11/2023   Procedure: RIGHT TOTAL KNEE ARTHROPLASTY-CEMENTED;  Surgeon: Cammy Copa, MD;  Location: [facility] OR;  Service: Orthopedics;  Laterality: Right;   Patient Active Problem List   Diagnosis Date  Noted   S/P total knee replacement, 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   Arthritis of knee 12/18/2022   Nonrheumatic mitral valve regurgitation s/p repair 2023    Peroneal tendinitis of right lower extremity 04/25/2021   Benign localized prostatic hyperplasia with lower urinary tract symptoms (LUTS) 09/27/2020   Wheezing 09/14/2020   Chondrocalcinosis 03/14/2020   Dyslipidemia 09/23/2017   Bilateral primary osteoarthritis of knee 09/02/2017   Neck pain 02/15/2017   Primary osteoarthritis of right hip 02/15/2017   Tear of right acetabular labrum 02/15/2017   Right leg pain 01/04/2017   Obese 08/17/2015   Elevated blood sugar 08/17/2015   Shingles 09/10/2014   Dyspnea 11/27/2011   HERNIA, UMBILICAL 08/02/2008   Essential hypertension 01/30/2008   Allergic rhinitis 01/30/2008   GERD 01/30/2008   HEADACHE 01/30/2008    PCP: Ardith Dark, MD   REFERRING PROVIDER: Cammy Copa, MD  REFERRING DIAG: [phone] (ICD-10-CM) - S/P total knee replacement, right  THERAPY DIAG:  Acute pain of right knee  Muscle weakness (generalized)  Difficulty walking  Localized edema  Rationale for Evaluation and Treatment: Rehabilitation  ONSET DATE: S/P Right total knee arthroplasty on 04/11/23  SUBJECTIVE:   SUBJECTIVE STATEMENT: Knee feels like it has tightened up over the past couple of days.    PERTINENT HISTORY: R TKA,6/13, asthma, CAD, GERD, headaches, HTN.  PAIN:  Are  you having pain? Yes: NPRS scale: 7/10 Pain location: Rt knee sides and down shin bone Pain description: cramp, spasm Aggravating factors: extending knee and turning foot, flexing knee Relieving factors: ice, rest  PRECAUTIONS: None  WEIGHT BEARING RESTRICTIONS: No  FALLS:  Has patient fallen in last 6 months? No  LIVING ENVIRONMENT: Has 3 steps to enter, he says he can do these without too much difficulty  OCCUPATION: retired  PLOF: Independent  PATIENT GOALS: get back to walking normal, hike  again  OBJECTIVE:   DIAGNOSTIC FINDINGS:  New XR taken 04/24/23, no report was available at time of eval.  PATIENT SURVEYS:  Eval: FOTO 49% functional intake, goal is 60%  COGNITION: Overall cognitive status: Within functional limits for tasks assessed     SENSATION: WFL  EDEMA:  Moderate swelling in Rt knee/leg  MUSCLE LENGTH: Hamstrings: Right is tight Maisie Fus test: Right is tight   LOWER EXTREMITY ROM:  Active /Passsive ROM Right eval Right 04/26/23  Knee flexion 98/103 A: 108 AA: 110  Knee extension 5/4    (Blank rows = not tested)  LOWER EXTREMITY MMT:  MMT in sitting Right eval  Hip flexion 4  Hip extension   Hip abduction 4  Hip adduction   Hip internal rotation   Hip external rotation   Knee flexion 4+  Knee extension 4+   (Blank rows = not tested)  FUNCTIONAL TESTS:  EVAL: Timed up and go (TUG): 16.85   GAIT: Eval: Distance walked: 100 feet X 2  Comments: arrives with rollator but was able to ambulate without AD but with supervision and antalgic gait on Rt with decreased hip/knee flexion during swing phase   TODAY'S TREATMENT:  04/26/23 TherEx Recumbent bike seat 4 x 8 min; partial revolutions Seated SLR 2x10; min cues for quad activation Sit to/from stand without UE support x 10 reps LAQ 2x10; 4#; 5 sec hold AA heel slides x 10 reps  Manual Seated Rt knee flexion with overpressure to tolerance  Modalities Vasopnuematic device X 10 min, medium compression, 34 deg to Rt knee    Eval HEP creation and review with demonstration and trial set preformed, see below for details Rt knee PROM with overpressure to tolerance Sci fit bike L3 X 5 min full revolutions, precor bike rocking for ROM 2 min -Vasopnuematic device X 10 min, medium compression, 34 deg to Rt knee     PATIENT EDUCATION: Education details: HEP, PT plan of care Person educated: Patient Education method: Explanation, Demonstration, Verbal cues, and Handouts Education  comprehension: verbalized understanding and needs further education   HOME EXERCISE PROGRAM: Access Code: [email or web address] URL: [email or web address] Date: 04/24/2023 Prepared by: Ivery Quale  Exercises - Supine Heel Slide with Strap  - 2 x daily - 6 x weekly - 1 sets - 10 reps - 5 hold - Supine Quad Set  - 2 x daily - 6 x weekly - 2 sets - 10 reps - 5 hold - Seated Straight Leg Raise with Quad Contraction  - 2 x daily - 6 x weekly - 2-3 sets - 10 reps - Mini Squat with Chair  - 2 x daily - 6 x weekly - 1-2 sets - 10 reps - Seated Knee Flexion Stretch  - 2 x daily - 6 x weekly - 1-2 sets - 10 reps - 5 sec hold  ASSESSMENT:  CLINICAL IMPRESSION:  Good improvement in AROM today even with reports of increased swelling. Overall doing well with PT at this time.  Will  continue to benefit from PT to maximize function.   OBJECTIVE IMPAIRMENTS: decreased activity tolerance, difficulty walking, decreased balance, decreased endurance, decreased mobility, decreased ROM, decreased strength, impaired flexibility, impaired LE use, and pain.  ACTIVITY LIMITATIONS: bending, lifting, carry, locomotion, cleaning, community activity, driving,   PERSONAL FACTORS: R TKA,6/13, asthma, CAD, GERD, headaches, HTN.  are also affecting patient's functional outcome.  REHAB POTENTIAL: Good  CLINICAL DECISION MAKING: Stable/uncomplicated  EVALUATION COMPLEXITY: Low    GOALS: Short term PT Goals Target date: 05/22/2023   Pt will be I and compliant with HEP. Baseline:  Goal status: New Pt will decrease pain by 25% overall Baseline: Goal status: New  Long term PT goals Target date:06/19/2023   Pt will improve Rt knee PROM 0-110 deg and AROM to  [facility] to improve functional mobility Baseline: Goal status: MET 04/26/23 Pt will improve  hip/knee strength to at least 5-/5 MMT to improve functional strength Baseline: Goal status: New Pt will improve FOTO to at least 60% functional to show improved  function Baseline: Goal status: New Pt will reduce pain to overall less than 2-3/10 with usual activity and work activity. Baseline: Goal status: New Pt will be able to ambulate community distances at least 1000 ft WNL gait pattern without complaints Baseline: Goal status: New  PLAN: PT FREQUENCY: 1-3 times per week   PT DURATION: 6-8 weeks  PLANNED INTERVENTIONS (unless contraindicated): aquatic PT, Canalith repositioning, cryotherapy, Electrical stimulation, Iontophoresis with 4 mg/ml dexamethasome, Moist heat, traction, Ultrasound, gait training, Therapeutic exercise, balance training, neuromuscular re-education, patient/family education, prosthetic training, manual techniques, passive ROM, dry needling, taping, vasopnuematic device, vestibular, spinal manipulations, joint manipulations  PLAN FOR NEXT SESSION: gait training without cane, knee ROM and quad activation as tolerated, closed chain and balance activities  NEXT MD VISIT: 05/22/23   Moshe Cipro, PT,DPT 04/26/2023, 9:26 AM

## 2023-04-26 NOTE — Telephone Encounter (Signed)
Dr. Jimmey Ralph, is pt suppose to be taking Lisinopril-hydrochlorothiazide 20-25 mg one tablet daily? Looks like Inetta Fermo cancelled at [employer] visit.

## 2023-04-29 ENCOUNTER — Ambulatory Visit: Payer: Medicare Other | Admitting: Rehabilitative and Restorative Service Providers"

## 2023-04-29 ENCOUNTER — Encounter: Payer: Self-pay | Admitting: Rehabilitative and Restorative Service Providers"

## 2023-04-29 ENCOUNTER — Telehealth: Payer: Self-pay | Admitting: Orthopedic Surgery

## 2023-04-29 ENCOUNTER — Other Ambulatory Visit: Payer: Self-pay | Admitting: Surgical

## 2023-04-29 DIAGNOSIS — M6281 Muscle weakness (generalized): Secondary | ICD-10-CM | POA: Diagnosis not present

## 2023-04-29 DIAGNOSIS — M25561 Pain in right knee: Secondary | ICD-10-CM | POA: Diagnosis not present

## 2023-04-29 DIAGNOSIS — R262 Difficulty in walking, not elsewhere classified: Secondary | ICD-10-CM | POA: Diagnosis not present

## 2023-04-29 DIAGNOSIS — R600 Localized edema: Secondary | ICD-10-CM

## 2023-04-29 MED ORDER — HYDROCODONE-ACETAMINOPHEN 5-325 MG PO TABS: 1.0000 | ORAL_TABLET | Freq: Four times a day (QID) | ORAL | 0 refills | Status: DC | PRN

## 2023-04-29 NOTE — Therapy (Signed)
OUTPATIENT PHYSICAL THERAPY TREATMENT   Patient Name: Frank [name] MRN: [id number] DOB:1951-05-25, 72 y.o., male Today's Date: 04/29/2023  END OF SESSION:  PT End of Session - 04/29/23 1304     Visit Number 3    Number of Visits 15    Date for PT Re-Evaluation 06/19/23    Authorization Type UHC MCR    Progress Note Due on Visit 10    PT Start Time 1258    PT Stop Time 1348    PT Time Calculation (min) 50 min    Activity Tolerance Patient tolerated treatment well    Behavior During Therapy [facility] for tasks assessed/performed               Past Medical History:  Diagnosis Date   Allergy    Asthma    Coronary artery disease    GERD (gastroesophageal reflux disease)    Headache(784.0)    History of kidney stones    Hypertension    Osteoarthritis of knee    right   Past Surgical History:  Procedure Laterality Date   CARDIAC CATHETERIZATION     HERNIA REPAIR  1969, 2009   kidney stones  05/20/1995   KNEE SURGERY  02/26/1989   MITRAL VALVE REPAIR  01/02/2022   NASAL SINUS SURGERY  10/24/1992   RIGHT/LEFT HEART CATH AND CORONARY ANGIOGRAPHY N/A 11/15/2021   Procedure: RIGHT/LEFT HEART CATH AND CORONARY ANGIOGRAPHY;  Surgeon: Orbie Pyo, MD;  Location: [facility] INVASIVE CV LAB;  Service: Cardiovascular;  Laterality: N/A;   ROTATOR CUFF REPAIR Left 04/25/1993   TEE WITHOUT CARDIOVERSION N/A 08/24/2021   Procedure: TRANSESOPHAGEAL ECHOCARDIOGRAM (TEE);  Surgeon: Jodelle Red, MD;  Location: [facility] ENDOSCOPY;  Service: Cardiovascular;  Laterality: N/A;   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   Procedure: LEFT TOTAL KNEE ARTHROPLASTY;  Surgeon: Cammy Copa, MD;  Location: [facility] OR;  Service: Orthopedics;  Laterality: Left;   TOTAL KNEE ARTHROPLASTY Right 04/11/2023   Procedure: RIGHT TOTAL KNEE ARTHROPLASTY-CEMENTED;  Surgeon: Cammy Copa, MD;  [facility];  Service: Orthopedics;  Laterality: Right;   Patient Active Problem List   Diagnosis Date  Noted   S/P total knee replacement, 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   Arthritis of right knee 12/18/2022   Nonrheumatic mitral valve regurgitation s/p repair 2023    Peroneal tendinitis of right lower extremity 04/25/2021   Benign localized prostatic hyperplasia with lower urinary tract symptoms (LUTS) 09/27/2020   Wheezing 09/14/2020   Chondrocalcinosis 03/14/2020   Dyslipidemia 09/23/2017   Bilateral primary osteoarthritis of knee 09/02/2017   Neck pain 02/15/2017   Primary osteoarthritis of right hip 02/15/2017   Tear of right acetabular labrum 02/15/2017   Right leg pain 01/04/2017   Obese 08/17/2015   Elevated blood sugar 08/17/2015   Shingles 09/10/2014   Dyspnea 11/27/2011   HERNIA, UMBILICAL 08/02/2008   Essential hypertension 01/30/2008   Allergic rhinitis 01/30/2008   GERD 01/30/2008   HEADACHE 01/30/2008    PCP: Ardith Dark, MD   REFERRING PROVIDER: Cammy Copa, MD  REFERRING DIAG: [phone] (ICD-10-CM) - S/P total knee replacement, right  THERAPY DIAG:  Acute pain of right knee  Muscle weakness (generalized)  Difficulty walking  Localized edema  Rationale for Evaluation and Treatment: Rehabilitation  ONSET DATE: S/P Right total knee arthroplasty on 04/11/23  SUBJECTIVE:   SUBJECTIVE STATEMENT: He reported 2/10 or so pain upon arrival.   PERTINENT HISTORY: Rt TKA,6/13, asthma, CAD, GERD, headaches, HTN.   PAIN:  NPRS scale: 2/10 Pain  location: Rt knee sides and down shin bone Pain description: cramp, spasm Aggravating factors: extending knee and turning foot, flexing knee Relieving factors: ice, rest  PRECAUTIONS: None  WEIGHT BEARING RESTRICTIONS: No  FALLS:  Has patient fallen in last 6 months? No  LIVING ENVIRONMENT: Has 3 steps to enter, he says he can do these without too much difficulty  OCCUPATION: retired  PLOF: Independent  PATIENT GOALS: get back to walking normal, hike again  OBJECTIVE:   DIAGNOSTIC FINDINGS:  04/24/2023  review:  New XR taken 04/24/23, no report was available at time of eval.  PATIENT SURVEYS:  04/24/2023  FOTO 49% functional intake, goal is 60%  COGNITION: 04/24/2023 Overall cognitive status: Within functional limits for tasks assessed     SENSATION: 04/24/2023 [facility]  EDEMA:  04/24/2023 Moderate swelling in Rt knee/leg  MUSCLE LENGTH: 04/24/2023 Hamstrings: Right is tight Maisie Fus test: Right is tight   LOWER EXTREMITY ROM:  Active /Passsive ROM Right 04/24/2023 Right 04/26/23  Knee flexion 98/103 A: 108 AA: 110  Knee extension 5/4    (Blank rows = not tested)  LOWER EXTREMITY MMT:  MMT in sitting Right 04/24/2023  Hip flexion 4  Hip extension   Hip abduction 4  Hip adduction   Hip internal rotation   Hip external rotation   Knee flexion 4+  Knee extension 4+   (Blank rows = not tested)  FUNCTIONAL TESTS:  04/24/2023 EVAL: Timed up and go (TUG): 16.85   GAIT: 04/24/2023: Distance walked: 100 feet X 2  Comments: arrives with rollator but was able to ambulate without AD but with supervision and antalgic gait on Rt with decreased hip/knee flexion during swing phase                     TODAY'S TREATMENT:                                                                      DATE: 04/29/2023 TherEx UBE UE/LE seat 8 8 mins lvl 2.5 for ROM Leg press double leg 75 lbs x 15, Rt leg only 31 lbs 2 x 15 Step up forward 4 inch step WB on Rt leg 2 x 10 with single hand assist on bar Seated quad set 5 sec hold x 10 Rt leg   Neuro Re-ed Alt toe tapping 4 inch step x 15 bilateral Tandem stance 1 min x 1 bilateral occasional HHA on bar  Modalities Vasopnuematic device X 10 min, medium compression, 34 deg to Rt knee  TODAY'S TREATMENT:                                                                      DATE: 04/26/23 TherEx Recumbent bike seat 4 x 8 min; partial revolutions Seated SLR 2x10; min cues for quad activation Sit to/from stand without UE support x 10  reps LAQ 2x10; 4#; 5 sec hold AA heel slides x 10 reps  Manual Seated Rt knee flexion with overpressure to tolerance  Modalities Vasopnuematic device X 10 min, medium compression, 34 deg to Rt knee    TODAY'S TREATMENT:                                                                      DATE: 04/24/2023 HEP creation and review with demonstration and trial set preformed, see below for details Rt knee PROM with overpressure to tolerance Sci fit bike L3 X 5 min full revolutions, precor bike rocking for ROM 2 min -Vasopnuematic device X 10 min, medium compression, 34 deg to Rt knee     PATIENT EDUCATION: Education details: HEP, PT plan of care Person educated: Patient Education method: Explanation, Demonstration, Verbal cues, and Handouts Education comprehension: verbalized understanding and needs further education   HOME EXERCISE PROGRAM: Access Code: [email or web address] URL: [email or web address] Date: 04/24/2023 Prepared by: Ivery Quale  Exercises - Supine Heel Slide with Strap  - 2 x daily - 6 x weekly - 1 sets - 10 reps - 5 hold - Supine Quad Set  - 2 x daily - 6 x weekly - 2 sets - 10 reps - 5 hold - Seated Straight Leg Raise with Quad Contraction  - 2 x daily - 6 x weekly - 2-3 sets - 10 reps - Mini Squat with Chair  - 2 x daily - 6 x weekly - 1-2 sets - 10 reps - Seated Knee Flexion Stretch  - 2 x daily - 6 x weekly - 1-2 sets - 10 reps - 5 sec hold  ASSESSMENT:  CLINICAL IMPRESSION:  Ambulation between activity in clinic c SPC in lt UE c good sequencing.  Pt demonstrated good progression in range tolerance as well as strengthening progression.  Continued skilled PT services warranted at this time.     OBJECTIVE IMPAIRMENTS: decreased activity tolerance, difficulty walking, decreased balance, decreased endurance, decreased mobility, decreased ROM, decreased strength, impaired flexibility, impaired LE use, and pain.  ACTIVITY LIMITATIONS: bending, lifting,  carry, locomotion, cleaning, community activity, driving,   PERSONAL FACTORS: R TKA,6/13, asthma, CAD, GERD, headaches, HTN.  are also affecting patient's functional outcome.  REHAB POTENTIAL: Good  CLINICAL DECISION MAKING: Stable/uncomplicated  EVALUATION COMPLEXITY: Low    GOALS: Short term PT Goals Target date: 05/22/2023   Pt will be I and compliant with HEP. Baseline:  Goal status: on going 04/29/2023 Pt will decrease pain by 25% overall Baseline: Goal status: on going 04/29/2023  Long term PT goals Target date:06/19/2023   Pt will improve Rt knee PROM 0-110 deg and AROM to  [facility] to improve functional mobility Baseline: Goal status: MET 04/26/23 Pt will improve  hip/knee strength to at least 5-/5 MMT to improve functional strength Baseline: Goal status: New Pt will improve FOTO to at least 60% functional to show improved function Baseline: Goal status: New Pt will reduce pain to overall less than 2-3/10 with usual activity and work activity. Baseline: Goal status: New Pt will be able to ambulate community distances at least 1000 ft WNL gait pattern without complaints Baseline: Goal status: New  PLAN: PT FREQUENCY: 1-3 times per week   PT DURATION: 6-8 weeks  PLANNED INTERVENTIONS (unless contraindicated): aquatic PT, Canalith repositioning, cryotherapy, Electrical stimulation, Iontophoresis with 4 mg/ml dexamethasome, Moist heat, traction, Ultrasound, gait training, Therapeutic exercise,  balance training, neuromuscular re-education, patient/family education, prosthetic training, manual techniques, passive ROM, dry needling, taping, vasopnuematic device, vestibular, spinal manipulations, joint manipulations  PLAN FOR NEXT SESSION: SPC use to independent ambulation transitioning as able.  Progressive strengthening, balance improvements.   NEXT MD VISIT: 05/22/23   Chyrel Masson, PT, DPT, OCS, ATC 04/29/23  1:36 PM

## 2023-04-29 NOTE — Telephone Encounter (Signed)
Sent in earlier

## 2023-04-29 NOTE — Telephone Encounter (Signed)
Pt requesting refill on Hydrocodone please advise

## 2023-04-30 ENCOUNTER — Encounter: Payer: Self-pay | Admitting: Rehabilitative and Restorative Service Providers"

## 2023-04-30 ENCOUNTER — Ambulatory Visit: Payer: Medicare Other | Admitting: Rehabilitative and Restorative Service Providers"

## 2023-04-30 DIAGNOSIS — R600 Localized edema: Secondary | ICD-10-CM

## 2023-04-30 DIAGNOSIS — R262 Difficulty in walking, not elsewhere classified: Secondary | ICD-10-CM | POA: Diagnosis not present

## 2023-04-30 DIAGNOSIS — M6281 Muscle weakness (generalized): Secondary | ICD-10-CM

## 2023-04-30 DIAGNOSIS — M25561 Pain in right knee: Secondary | ICD-10-CM | POA: Diagnosis not present

## 2023-04-30 NOTE — Therapy (Signed)
OUTPATIENT PHYSICAL THERAPY TREATMENT   Patient Name: Frank [name] MRN: [id number] DOB:1951/09/08, 72 y.o., male Today's Date: 04/30/2023  END OF SESSION:  PT End of Session - 04/30/23 1439     Visit Number 4    Number of Visits 15    Date for PT Re-Evaluation 06/19/23    Authorization Type UHC MCR    Progress Note Due on Visit 10    PT Start Time 1432    PT Stop Time 1521    PT Time Calculation (min) 49 min    Activity Tolerance Patient tolerated treatment well;No increased pain    Behavior During Therapy [facility] for tasks assessed/performed             Past Medical History:  Diagnosis Date   Allergy    Asthma    Coronary artery disease    GERD (gastroesophageal reflux disease)    Headache(784.0)    History of kidney stones    Hypertension    Osteoarthritis of knee    right   Past Surgical History:  Procedure Laterality Date   CARDIAC CATHETERIZATION     HERNIA REPAIR  1969, 2009   kidney stones  05/20/1995   KNEE SURGERY  02/26/1989   MITRAL VALVE REPAIR  01/02/2022   NASAL SINUS SURGERY  10/24/1992   RIGHT/LEFT HEART CATH AND CORONARY ANGIOGRAPHY N/A 11/15/2021   Procedure: RIGHT/LEFT HEART CATH AND CORONARY ANGIOGRAPHY;  Surgeon: Orbie Pyo, MD;  Location: [facility] INVASIVE CV LAB;  Service: Cardiovascular;  Laterality: N/A;   ROTATOR CUFF REPAIR Left 04/25/1993   TEE WITHOUT CARDIOVERSION N/A 08/24/2021   Procedure: TRANSESOPHAGEAL ECHOCARDIOGRAM (TEE);  Surgeon: Jodelle Red, MD;  Location: [facility] ENDOSCOPY;  Service: Cardiovascular;  Laterality: N/A;   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   Procedure: LEFT TOTAL KNEE ARTHROPLASTY;  Surgeon: Cammy Copa, MD;  Location: [facility] OR;  Service: Orthopedics;  Laterality: Left;   TOTAL KNEE ARTHROPLASTY Right 04/11/2023   Procedure: RIGHT TOTAL KNEE ARTHROPLASTY-CEMENTED;  Surgeon: Cammy Copa, MD;  Location: [facility] OR;  Service: Orthopedics;  Laterality: Right;   Patient Active Problem List    Diagnosis Date Noted   S/P total knee replacement, 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   Arthritis of right knee 12/18/2022   Nonrheumatic mitral valve regurgitation s/p repair 2023    Peroneal tendinitis of right lower extremity 04/25/2021   Benign localized prostatic hyperplasia with lower urinary tract symptoms (LUTS) 09/27/2020   Wheezing 09/14/2020   Chondrocalcinosis 03/14/2020   Dyslipidemia 09/23/2017   Bilateral primary osteoarthritis of knee 09/02/2017   Neck pain 02/15/2017   Primary osteoarthritis of right hip 02/15/2017   Tear of right acetabular labrum 02/15/2017   Right leg pain 01/04/2017   Obese 08/17/2015   Elevated blood sugar 08/17/2015   Shingles 09/10/2014   Dyspnea 11/27/2011   HERNIA, UMBILICAL 08/02/2008   Essential hypertension 01/30/2008   Allergic rhinitis 01/30/2008   GERD 01/30/2008   HEADACHE 01/30/2008    PCP: Ardith Dark, MD   REFERRING PROVIDER: Cammy Copa, MD  REFERRING DIAG: [phone] (ICD-10-CM) - S/P total knee replacement, right  THERAPY DIAG:  Acute pain of right knee  Muscle weakness (generalized)  Difficulty walking  Localized edema  Rationale for Evaluation and Treatment: Rehabilitation  ONSET DATE: S/P Right total knee arthroplasty on 04/11/23  SUBJECTIVE:   SUBJECTIVE STATEMENT: Frank [name] reports good HEP compliance.  He is getting 3-4 hours of sleep uninterrupted.  PERTINENT HISTORY: Rt TKA,6/13, asthma, CAD, GERD, headaches, HTN.   PAIN:  NPRS scale: 2-5/10 over the past week Pain location: Rt knee sides and down shin bone Pain description: cramp, spasm Aggravating factors: extending knee and turning foot, flexing knee Relieving factors: ice, rest  PRECAUTIONS: None  WEIGHT BEARING RESTRICTIONS: No  FALLS:  Has patient fallen in last 6 months? No  LIVING ENVIRONMENT: Has 3 steps to enter, he says he can do these without too much difficulty  OCCUPATION: retired  PLOF: Independent  PATIENT GOALS: get back to  walking normal, hike again  OBJECTIVE:   DIAGNOSTIC FINDINGS:  04/24/2023 review:  New XR taken 04/24/23, no report was available at time of eval.  PATIENT SURVEYS:  04/24/2023  FOTO 49% functional intake, goal is 60%  COGNITION: 04/24/2023 Overall cognitive status: Within functional limits for tasks assessed     SENSATION: 04/24/2023 [facility]  EDEMA:  04/24/2023 Moderate swelling in Rt knee/leg  MUSCLE LENGTH: 04/24/2023 Hamstrings: Right is tight Maisie Fus test: Right is tight   LOWER EXTREMITY ROM:  Active /Passsive ROM Right 04/24/2023 Right 04/26/23 Left/Right 04/30/2023  Knee flexion 98/103 A: 108 AA: 110 117/117  Knee extension 5/4  0/-1   (Blank rows = not tested)  LOWER EXTREMITY MMT:  MMT in sitting Right 04/24/2023  Hip flexion 4  Hip extension   Hip abduction 4  Hip adduction   Hip internal rotation   Hip external rotation   Knee flexion 4+  Knee extension 4+   (Blank rows = not tested)  FUNCTIONAL TESTS:  04/24/2023 EVAL: Timed up and go (TUG): 16.85   GAIT: 04/24/2023: Distance walked: 100 feet X 2  Comments: arrives with rollator but was able to ambulate without AD but with supervision and antalgic gait on Rt with decreased hip/knee flexion during swing phase                     TODAY'S TREATMENT:                                                                      DATE:  04/30/2023 Recumbent bike Seat 5 for 5 minutes AAROM to AROM Seated knee flexion AAROM (left pushes right into flexion) 10 x 10 seconds Quadriceps sets 10 x 5 seconds  Functional Activities: Sit to stand slow eccentrics 10 x Step-up and over 4, 6 and 8 inch step, slow eccentrics 10 x each Single leg press right only slow eccentrics 10 x slow eccentrics 50#   Vaso right knee Medium 34* 10 minutes   04/29/2023 TherEx UBE UE/LE seat 8 8 mins lvl 2.5 for ROM Leg press double leg 75 lbs x 15, Rt leg only 31 lbs 2 x 15 Step up forward 4 inch step WB on Rt leg 2 x 10 with  single hand assist on bar Seated quad set 5 sec hold x 10 Rt leg   Neuro Re-ed Alt toe tapping 4 inch step x 15 bilateral Tandem stance 1 min x 1 bilateral occasional HHA on bar  Modalities Vasopnuematic device X 10 min, medium compression, 34 deg to Rt knee   04/26/23 TherEx Recumbent bike seat 4 x 8 min; partial revolutions Seated SLR 2x10; min cues for quad activation Sit to/from stand without UE support x 10 reps LAQ 2x10; 4#; 5  sec hold AA heel slides x 10 reps  Manual Seated Rt knee flexion with overpressure to tolerance  Modalities Vasopnuematic device X 10 min, medium compression, 34 deg to Rt knee    PATIENT EDUCATION: Education details: HEP, PT plan of care Person educated: Patient Education method: Explanation, Demonstration, Verbal cues, and Handouts Education comprehension: verbalized understanding and needs further education   HOME EXERCISE PROGRAM: Access Code: [email or web address] URL: [email or web address] Date: 04/24/2023 Prepared by: Ivery Quale  Exercises - Supine Heel Slide with Strap  - 2 x daily - 6 x weekly - 1 sets - 10 reps - 5 hold - Supine Quad Set  - 2 x daily - 6 x weekly - 2 sets - 10 reps - 5 hold - Seated Straight Leg Raise with Quad Contraction  - 2 x daily - 6 x weekly - 2-3 sets - 10 reps - Mini Squat with Chair  - 2 x daily - 6 x weekly - 1-2 sets - 10 reps - Seated Knee Flexion Stretch  - 2 x daily - 6 x weekly - 1-2 sets - 10 reps - 5 sec hold  ASSESSMENT:  CLINICAL IMPRESSION:  AROM was 1 - 0 - 117 degrees today.  Frank [name] is doing a great job with his early HEP compliance and AROM.  Continued quadriceps strengthening, balance and functional activities will prepare him for independent rehabilitation.   OBJECTIVE IMPAIRMENTS: decreased activity tolerance, difficulty walking, decreased balance, decreased endurance, decreased mobility, decreased ROM, decreased strength, impaired flexibility, impaired LE use, and  pain.  ACTIVITY LIMITATIONS: bending, lifting, carry, locomotion, cleaning, community activity, driving,   PERSONAL FACTORS: R TKA,6/13, asthma, CAD, GERD, headaches, HTN.  are also affecting patient's functional outcome.  REHAB POTENTIAL: Good  CLINICAL DECISION MAKING: Stable/uncomplicated  EVALUATION COMPLEXITY: Low    GOALS: Short term PT Goals Target date: 05/22/2023   Pt will be I and compliant with HEP. Baseline:  Goal status: on going 04/29/2023 Pt will decrease pain by 25% overall Baseline: Goal status: on going 04/29/2023  Long term PT goals Target date:06/19/2023   Pt will improve Rt knee PROM 0-110 deg and AROM to  [facility] to improve functional mobility Baseline: Goal status: MET 04/26/23 Pt will improve  hip/knee strength to at least 5-/5 MMT to improve functional strength Baseline: Goal status: New Pt will improve FOTO to at least 60% functional to show improved function Baseline: Goal status: New Pt will reduce pain to overall less than 2-3/10 with usual activity and work activity. Baseline: Goal status: New Pt will be able to ambulate community distances at least 1000 ft WNL gait pattern without complaints Baseline: Goal status: New  PLAN: PT FREQUENCY: 1-3 times per week   PT DURATION: 6-8 weeks  PLANNED INTERVENTIONS (unless contraindicated): aquatic PT, Canalith repositioning, cryotherapy, Electrical stimulation, Iontophoresis with 4 mg/ml dexamethasome, Moist heat, traction, Ultrasound, gait training, Therapeutic exercise, balance training, neuromuscular re-education, patient/family education, prosthetic training, manual techniques, passive ROM, dry needling, taping, vasopnuematic device, vestibular, spinal manipulations, joint manipulations  PLAN FOR NEXT SESSION: SPC to independent ambulation transition.  Progressive strengthening, balance and functional improvements.   NEXT MD VISIT: 05/22/23   Cherlyn Cushing PT, MPT 04/30/23  5:42 PM

## 2023-05-01 ENCOUNTER — Other Ambulatory Visit: Payer: Self-pay | Admitting: Family Medicine

## 2023-05-06 ENCOUNTER — Telehealth: Payer: Self-pay | Admitting: *Deleted

## 2023-05-06 NOTE — Telephone Encounter (Signed)
Patient called requesting refill of pain medication 

## 2023-05-07 ENCOUNTER — Other Ambulatory Visit: Payer: Self-pay | Admitting: Orthopedic Surgery

## 2023-05-07 ENCOUNTER — Ambulatory Visit: Payer: Medicare Other | Admitting: Rehabilitative and Restorative Service Providers"

## 2023-05-07 ENCOUNTER — Encounter: Payer: Self-pay | Admitting: Rehabilitative and Restorative Service Providers"

## 2023-05-07 DIAGNOSIS — R262 Difficulty in walking, not elsewhere classified: Secondary | ICD-10-CM

## 2023-05-07 DIAGNOSIS — M25561 Pain in right knee: Secondary | ICD-10-CM

## 2023-05-07 DIAGNOSIS — R600 Localized edema: Secondary | ICD-10-CM

## 2023-05-07 DIAGNOSIS — M6281 Muscle weakness (generalized): Secondary | ICD-10-CM | POA: Diagnosis not present

## 2023-05-07 MED ORDER — HYDROCODONE-ACETAMINOPHEN 5-325 MG PO TABS: 1.0000 | ORAL_TABLET | Freq: Four times a day (QID) | ORAL | 0 refills | Status: DC | PRN

## 2023-05-07 NOTE — Telephone Encounter (Signed)
Patient called again this morning requesting refill of pain medication. Thanks.

## 2023-05-07 NOTE — Therapy (Addendum)
OUTPATIENT PHYSICAL THERAPY TREATMENT   Patient Name: Frank [name] MRN: [id number] DOB:06-28-1951, 72 y.o., male Today's Date: 05/07/2023  END OF SESSION:  PT End of Session - 05/07/23 1300     Visit Number 5    Number of Visits 15    Date for PT Re-Evaluation 06/19/23    Authorization Type U[employer] MCR    Progress Note Due on Visit 10    PT Start Time 1300    PT Stop Time 1350    PT Time Calculation (min) 50 min    Activity Tolerance Patient tolerated treatment well    Behavior During Therapy [facility] for tasks assessed/performed              Past Medical History:  Diagnosis Date   Allergy    Asthma    Coronary artery disease    GERD (gastroesophageal reflux disease)    Headache(784.0)    History of kidney stones    Hypertension    Osteoarthritis of knee    right   Past Surgical History:  Procedure Laterality Date   CARDIAC CATHETERIZATION     HERNIA REPAIR  1969, 2009   kidney stones  05/20/1995   KNEE SURGERY  02/26/1989   MITRAL VALVE REPAIR  01/02/2022   NASAL SINUS SURGERY  10/24/1992   RIGHT/LEFT HEART CATH AND CORONARY ANGIOGRAPHY N/A 11/15/2021   Procedure: RIGHT/LEFT HEART CATH AND CORONARY ANGIOGRAPHY;  Surgeon: Orbie Pyo, MD;  Location: [facility] INVASIVE CV LAB;  Service: Cardiovascular;  Laterality: N/A;   ROTATOR CUFF REPAIR Left 04/25/1993   TEE WITHOUT CARDIOVERSION N/A 08/24/2021   Procedure: TRANSESOPHAGEAL ECHOCARDIOGRAM (TEE);  Surgeon: Jodelle Red, MD;  Location: [facility] ENDOSCOPY;  Service: Cardiovascular;  Laterality: N/A;   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   Procedure: LEFT TOTAL KNEE ARTHROPLASTY;  Surgeon: Cammy Copa, MD;  Location: [facility] OR;  Service: Orthopedics;  Laterality: Left;   TOTAL KNEE ARTHROPLASTY Right 04/11/2023   Procedure: RIGHT TOTAL KNEE ARTHROPLASTY-CEMENTED;  Surgeon: Cammy Copa, MD;  Location: [facility] OR;  Service: Orthopedics;  Laterality: Right;   Patient Active Problem List   Diagnosis Date  Noted   S/P total knee replacement, 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   Arthritis of right knee 12/18/2022   Nonrheumatic mitral valve regurgitation s/p repair 2023    Peroneal tendinitis of right lower extremity 04/25/2021   Benign localized prostatic hyperplasia with lower urinary tract symptoms (LUTS) 09/27/2020   Wheezing 09/14/2020   Chondrocalcinosis 03/14/2020   Dyslipidemia 09/23/2017   Bilateral primary osteoarthritis of knee 09/02/2017   Neck pain 02/15/2017   Primary osteoarthritis of right hip 02/15/2017   Tear of right acetabular labrum 02/15/2017   Right leg pain 01/04/2017   Obese 08/17/2015   Elevated blood sugar 08/17/2015   Shingles 09/10/2014   Dyspnea 11/27/2011   HERNIA, UMBILICAL 08/02/2008   Essential hypertension 01/30/2008   Allergic rhinitis 01/30/2008   GERD 01/30/2008   HEADACHE 01/30/2008    PCP: Ardith Dark, MD   REFERRING PROVIDER: Cammy Copa, MD  REFERRING DIAG: [phone] (ICD-10-CM) - S/P total knee replacement, right  THERAPY DIAG:  Acute pain of right knee  Muscle weakness (generalized)  Difficulty walking  Localized edema  Rationale for Evaluation and Treatment: Rehabilitation  ONSET DATE: S/P Right total knee arthroplasty on 04/11/23  SUBJECTIVE:   SUBJECTIVE STATEMENT: Arrived with cane and reported using it more at home.    PERTINENT HISTORY: Rt TKA,6/13, asthma, CAD, GERD, headaches, HTN.   PAIN:  NPRS scale:  no specific pain upon arrival.  Pain location: Rt knee sides and down shin bone Pain description: cramp, spasm Aggravating factors:  Relieving factors: ice, rest  PRECAUTIONS: None  WEIGHT BEARING RESTRICTIONS: No  FALLS:  Has patient fallen in last 6 months? No  LIVING ENVIRONMENT: Has 3 steps to enter, he says he can do these without too much difficulty  OCCUPATION: retired  PLOF: Independent  PATIENT GOALS: get back to walking normal, hike again  OBJECTIVE:   DIAGNOSTIC FINDINGS:  04/24/2023 review:   New XR taken 04/24/23, no report was available at time of eval.  PATIENT SURVEYS:  04/24/2023  FOTO 49% functional intake, goal is 60%  COGNITION: 04/24/2023 Overall cognitive status: Within functional limits for tasks assessed     SENSATION: 04/24/2023 [facility]  EDEMA:  04/24/2023 Moderate swelling in Rt knee/leg  MUSCLE LENGTH: 04/24/2023 Hamstrings: Right is tight Maisie Fus test: Right is tight   LOWER EXTREMITY ROM:  Active /Passsive ROM Right 04/24/2023 Right 04/26/23 Left/Right 04/30/2023  Knee flexion 98/103 A: 108 AA: 110 117/117  Knee extension 5/4  0/-1   (Blank rows = not tested)  LOWER EXTREMITY MMT:  MMT in sitting Right 04/24/2023  Hip flexion 4  Hip extension   Hip abduction 4  Hip adduction   Hip internal rotation   Hip external rotation   Knee flexion 4+  Knee extension 4+   (Blank rows = not tested)  FUNCTIONAL TESTS:  04/24/2023 EVAL: Timed up and go (TUG): 16.85   GAIT: 05/07/2023: Arrived with [facility] but ambulated independently safely in clinic during visit.   04/24/2023: Distance walked: 100 feet X 2  Comments: arrives with rollator but was able to ambulate without AD but with supervision and antalgic gait on Rt with decreased hip/knee flexion during swing phase                     TODAY'S TREATMENT:                                                                      DATE: 05/07/2023 TherEx UBE UE/LE seat 8 6 mins lvl 3 for ROM Leg press double leg 87 lbs x 15, Rt leg only 31 lbs 2 x 15 Gastroc incline stretch bilateral 30 sec x 3 Lateral step down 4 inch step WB on Rt leg x15   (difficulty noted in control for slow lowering) Supine Rt knee LAQ in 90 deg hip flexion x 15 (cues for home for edema reduction) Supine Rt knee AROM heel slide 5 sec hold with SLR combo x 10  Neuro Re-ed Tandem ambulation fwd/back on foam in // bars with occasional to moderate HHA 6 ft x 6 each way SLS on black mat with contralateral leg touching x 6 each corner,  performed bilaterally in // bars with occasional HHA  Modalities Vasopnuematic device X 10 min, medium compression, 34 deg to Rt knee  TODAY'S TREATMENT:  DATE: 04/30/2023 Recumbent bike Seat 5 for 5 minutes AAROM to AROM Seated knee flexion AAROM (left pushes right into flexion) 10 x 10 seconds Quadriceps sets 10 x 5 seconds  Functional Activities: Sit to stand slow eccentrics 10 x Step-up and over 4, 6 and 8 inch step, slow eccentrics 10 x each Single leg press right only slow eccentrics 10 x slow eccentrics 50#   Vaso right knee Medium 34* 10 minutes   TODAY'S TREATMENT:                                                                      DATE:04/29/2023 TherEx UBE UE/LE seat 8 8 mins lvl 2.5 for ROM Leg press double leg 75 lbs x 15, Rt leg only 31 lbs 2 x 15 Step up forward 4 inch step WB on Rt leg 2 x 10 with single hand assist on bar Seated quad set 5 sec hold x 10 Rt leg   Neuro Re-ed Alt toe tapping 4 inch step x 15 bilateral Tandem stance 1 min x 1 bilateral occasional HHA on bar  Modalities Vasopnuematic device X 10 min, medium compression, 34 deg to Rt knee   PATIENT EDUCATION: Education details: HEP, PT plan of care Person educated: Patient Education method: Explanation, Demonstration, Verbal cues, and Handouts Education comprehension: verbalized understanding and needs further education   HOME EXERCISE PROGRAM: Access Code: [email or web address] URL: [email or web address] Date: 04/24/2023 Prepared by: Ivery Quale  Exercises - Supine Heel Slide with Strap  - 2 x daily - 6 x weekly - 1 sets - 10 reps - 5 hold - Supine Quad Set  - 2 x daily - 6 x weekly - 2 sets - 10 reps - 5 hold - Seated Straight Leg Raise with Quad Contraction  - 2 x daily - 6 x weekly - 2-3 sets - 10 reps - Mini Squat with Chair  - 2 x daily - 6 x weekly - 1-2 sets - 10 reps - Seated Knee Flexion Stretch  - 2 x daily - 6  x weekly - 1-2 sets - 10 reps - 5 sec hold  ASSESSMENT:  CLINICAL IMPRESSION:  Continued improvement in stabilization in ambulation showing independent ambulation in clinic today.  Range of motion continued to show quantitatively well but symptoms/difficulty in movements at times could improve.  Continued skilled PT services warranted at this time.    OBJECTIVE IMPAIRMENTS: decreased activity tolerance, difficulty walking, decreased balance, decreased endurance, decreased mobility, decreased ROM, decreased strength, impaired flexibility, impaired LE use, and pain.  ACTIVITY LIMITATIONS: bending, lifting, carry, locomotion, cleaning, community activity, driving,   PERSONAL FACTORS: R TKA,6/13, asthma, CAD, GERD, headaches, HTN.  are also affecting patient's functional outcome.  REHAB POTENTIAL: Good  CLINICAL DECISION MAKING: Stable/uncomplicated  EVALUATION COMPLEXITY: Low    GOALS: Short term PT Goals Target date: 05/22/2023   Pt will be I and compliant with HEP. Baseline:  Goal status: on going 04/29/2023 Pt will decrease pain by 25% overall Baseline: Goal status: on going 04/29/2023  Long term PT goals Target date:06/19/2023   Pt will improve Rt knee PROM 0-110 deg and AROM to  [facility] to improve functional mobility Baseline: Goal status: MET 04/26/23 Pt will improve  hip/knee strength to at least  5-/5 MMT to improve functional strength Baseline: Goal status: New Pt will improve FOTO to at least 60% functional to show improved function Baseline: Goal status: New Pt will reduce pain to overall less than 2-3/10 with usual activity and work activity. Baseline: Goal status: New Pt will be able to ambulate community distances at least 1000 ft WNL gait pattern without complaints Baseline: Goal status: New  PLAN: PT FREQUENCY: 1-3 times per week   PT DURATION: 6-8 weeks  PLANNED INTERVENTIONS (unless contraindicated): aquatic PT, Canalith repositioning, cryotherapy,  Electrical stimulation, Iontophoresis with 4 mg/ml dexamethasome, Moist heat, traction, Ultrasound, gait training, Therapeutic exercise, balance training, neuromuscular re-education, patient/family education, prosthetic training, manual techniques, passive ROM, dry needling, taping, vasopnuematic device, vestibular, spinal manipulations, joint manipulations  PLAN FOR NEXT SESSION: WB strengthening, compliant surface balance.   NEXT MD VISIT: 05/22/23  Chyrel Masson, PT, DPT, OCS, ATC 05/07/23  1:39 PM

## 2023-05-07 NOTE — Telephone Encounter (Signed)
Sent thx

## 2023-05-08 ENCOUNTER — Encounter: Payer: Self-pay | Admitting: Rehabilitative and Restorative Service Providers"

## 2023-05-08 ENCOUNTER — Encounter: Payer: Medicare Other | Admitting: Physical Therapy

## 2023-05-08 ENCOUNTER — Ambulatory Visit: Payer: Medicare Other | Admitting: Rehabilitative and Restorative Service Providers"

## 2023-05-08 DIAGNOSIS — R600 Localized edema: Secondary | ICD-10-CM | POA: Diagnosis not present

## 2023-05-08 DIAGNOSIS — M6281 Muscle weakness (generalized): Secondary | ICD-10-CM

## 2023-05-08 DIAGNOSIS — M25561 Pain in right knee: Secondary | ICD-10-CM

## 2023-05-08 DIAGNOSIS — R262 Difficulty in walking, not elsewhere classified: Secondary | ICD-10-CM

## 2023-05-08 NOTE — Therapy (Signed)
OUTPATIENT PHYSICAL THERAPY TREATMENT   Patient Name: Frank [name] MRN: [id number] DOB:[birth year], 72 y.o., male Today's Date: 05/08/2023  END OF SESSION:  PT End of Session - 05/08/23 0959     Visit Number 6    Number of Visits 15    Date for PT Re-Evaluation 06/19/23    Authorization Type UHC MCR    Progress Note Due on Visit 10    PT Start Time 0956    PT Stop Time 1045    PT Time Calculation (min) 49 min    Activity Tolerance Patient tolerated treatment well    Behavior During Therapy [facility] for tasks assessed/performed               Past Medical History:  Diagnosis Date   Allergy    Asthma    Coronary artery disease    GERD (gastroesophageal reflux disease)    Headache(784.0)    History of kidney stones    Hypertension    Osteoarthritis of knee    right   Past Surgical History:  Procedure Laterality Date   CARDIAC CATHETERIZATION     HERNIA REPAIR  1969, 2009   kidney stones  05/20/1995   KNEE SURGERY  02/26/1989   MITRAL VALVE REPAIR  01/02/2022   NASAL SINUS SURGERY  10/24/1992   RIGHT/LEFT HEART CATH AND CORONARY ANGIOGRAPHY N/A 11/15/2021   Procedure: RIGHT/LEFT HEART CATH AND CORONARY ANGIOGRAPHY;  Surgeon: Orbie Pyo, MD;  Location: [facility] INVASIVE CV LAB;  Service: Cardiovascular;  Laterality: N/A;   ROTATOR CUFF REPAIR Left 04/25/1993   TEE WITHOUT CARDIOVERSION N/A 08/24/2021   Procedure: TRANSESOPHAGEAL ECHOCARDIOGRAM (TEE);  Surgeon: Jodelle Red, MD;  Location: [facility] ENDOSCOPY;  Service: Cardiovascular;  Laterality: N/A;   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   Procedure: LEFT TOTAL KNEE ARTHROPLASTY;  Surgeon: Cammy Copa, MD;  Location: [facility] OR;  Service: Orthopedics;  Laterality: Left;   TOTAL KNEE ARTHROPLASTY Right 04/11/2023   Procedure: RIGHT TOTAL KNEE ARTHROPLASTY-CEMENTED;  Surgeon: Cammy Copa, MD;  Location: [facility] OR;  Service: Orthopedics;  Laterality: Right;   Patient Active Problem List   Diagnosis Date  Noted   S/P total knee replacement, 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   Arthritis of right knee 12/18/2022   Nonrheumatic mitral valve regurgitation s/p repair 2023    Peroneal tendinitis of right lower extremity 04/25/2021   Benign localized prostatic hyperplasia with lower urinary tract symptoms (LUTS) 09/27/2020   Wheezing 09/14/2020   Chondrocalcinosis 03/14/2020   Dyslipidemia 09/23/2017   Bilateral primary osteoarthritis of knee 09/02/2017   Neck pain 02/15/2017   Primary osteoarthritis of right hip 02/15/2017   Tear of right acetabular labrum 02/15/2017   Right leg pain 01/04/2017   Obese 08/17/2015   Elevated blood sugar 08/17/2015   Shingles 09/10/2014   Dyspnea 11/27/2011   HERNIA, UMBILICAL 08/02/2008   Essential hypertension 01/30/2008   Allergic rhinitis 01/30/2008   GERD 01/30/2008   HEADACHE 01/30/2008    PCP: Ardith Dark, MD   REFERRING PROVIDER: Cammy Copa, MD  REFERRING DIAG: [phone] (ICD-10-CM) - S/P total knee replacement, right  THERAPY DIAG:  Acute pain of right knee  Muscle weakness (generalized)  Difficulty walking  Localized edema  Rationale for Evaluation and Treatment: Rehabilitation  ONSET DATE: S/P Right total knee arthroplasty on 04/11/23  SUBJECTIVE:   SUBJECTIVE STATEMENT: Reported no pain upon arrival.  He did take a pain pill this morning because he was feeling some pain this morning upon waking.  PERTINENT HISTORY: Rt TKA,6/13, asthma, CAD, GERD, headaches, HTN.   PAIN:  NPRS scale: no specific pain upon arrival.  Pain location: Rt knee sides and down shin bone Pain description: cramp, spasm Aggravating factors:  Relieving factors: ice, rest  PRECAUTIONS: None  WEIGHT BEARING RESTRICTIONS: No  FALLS:  Has patient fallen in last 6 months? No  LIVING ENVIRONMENT: Has 3 steps to enter, he says he can do these without too much difficulty  OCCUPATION: retired  PLOF: Independent  PATIENT GOALS: get back to walking  normal, hike again  OBJECTIVE:   DIAGNOSTIC FINDINGS:  04/24/2023 review:  New XR taken 04/24/23, no report was available at time of eval.  PATIENT SURVEYS:  04/24/2023  FOTO 49% functional intake, goal is 60%  COGNITION: 04/24/2023 Overall cognitive status: Within functional limits for tasks assessed     SENSATION: 04/24/2023 [facility]  EDEMA:  04/24/2023 Moderate swelling in Rt knee/leg  MUSCLE LENGTH: 04/24/2023 Hamstrings: Right is tight Maisie Fus test: Right is tight   LOWER EXTREMITY ROM:  Active /Passsive ROM Right 04/24/2023 Right 04/26/23 Left/Right 04/30/2023  Knee flexion 98/103 A: 108 AA: 110 117/117  Knee extension 5/4  0/-1   (Blank rows = not tested)  LOWER EXTREMITY MMT:  MMT in sitting Right 04/24/2023  Hip flexion 4  Hip extension   Hip abduction 4  Hip adduction   Hip internal rotation   Hip external rotation   Knee flexion 4+  Knee extension 4+   (Blank rows = not tested)  FUNCTIONAL TESTS:  05/08/2023:  TUG independent:  10.7 seconds   04/24/2023 EVAL: Timed up and go (TUG): 16.85   GAIT: 05/07/2023: Arrived with [facility] but ambulated independently safely in clinic during visit.   04/24/2023: Distance walked: 100 feet X 2  Comments: arrives with rollator but was able to ambulate without AD but with supervision and antalgic gait on Rt with decreased hip/knee flexion during swing phase                     TODAY'S TREATMENT:                                                                      DATE: 05/08/2023 TherEx UBE UE/LE seat 8 8 mins lvl 3 for ROM Knee extension machine double leg up, Rt leg lowering slowly 2 x 10 5 lbs  Knee flexion machine Rt leg only 10 lbs 2 x 10  Gastroc incline stretch bilateral 30 sec x 3 Forward step up WB on Rt leg 2 x 15 6 inch step no UE assist.  Seated Rt knee flexion c overpressure Lt leg 15 sec hold x 5   Neuro Re-ed SLS with contralateral leg step over and back 6 inch hurdle x 15 bilaterally, occasional  HHA on bar Feet together PF/DF alternating x 20 each way on foam with occasional HHA and SBA from clinician   Modalities Vasopnuematic device X 10 min, medium compression, 34 deg to Rt knee  TODAY'S TREATMENT:  DATE: 05/07/2023 TherEx UBE UE/LE seat 8 6 mins lvl 3 for ROM Leg press double leg 87 lbs x 15, Rt leg only 31 lbs 2 x 15 Gastroc incline stretch bilateral 30 sec x 3 Lateral step down 4 inch step WB on Rt leg x15   (difficulty noted in control for slow lowering) Supine Rt knee LAQ in 90 deg hip flexion x 15 (cues for home for edema reduction) Supine Rt knee AROM heel slide 5 sec hold with SLR combo x 10  Neuro Re-ed Tandem ambulation fwd/back on foam in // bars with occasional to moderate HHA 6 ft x 6 each way SLS on black mat with contralateral leg touching x 6 each corner, performed bilaterally in // bars with occasional HHA  Modalities Vasopnuematic device X 10 min, medium compression, 34 deg to Rt knee  TODAY'S TREATMENT:                                                                      DATE: 04/30/2023 Recumbent bike Seat 5 for 5 minutes AAROM to AROM Seated knee flexion AAROM (left pushes right into flexion) 10 x 10 seconds Quadriceps sets 10 x 5 seconds  Functional Activities: Sit to stand slow eccentrics 10 x Step-up and over 4, 6 and 8 inch step, slow eccentrics 10 x each Single leg press right only slow eccentrics 10 x slow eccentrics 50#   Vaso right knee Medium 34* 10 minutes   TODAY'S TREATMENT:                                                                      DATE:04/29/2023 TherEx UBE UE/LE seat 8 8 mins lvl 2.5 for ROM Leg press double leg 75 lbs x 15, Rt leg only 31 lbs 2 x 15 Step up forward 4 inch step WB on Rt leg 2 x 10 with single hand assist on bar Seated quad set 5 sec hold x 10 Rt leg   Neuro Re-ed Alt toe tapping 4 inch step x 15 bilateral Tandem stance 1 min x 1  bilateral occasional HHA on bar  Modalities Vasopnuematic device X 10 min, medium compression, 34 deg to Rt knee   PATIENT EDUCATION: Education details: HEP, PT plan of care Person educated: Patient Education method: Explanation, Demonstration, Verbal cues, and Handouts Education comprehension: verbalized understanding and needs further education   HOME EXERCISE PROGRAM: Access Code: [email or web address] URL: [email or web address] Date: 04/24/2023 Prepared by: Ivery Quale  Exercises - Supine Heel Slide with Strap  - 2 x daily - 6 x weekly - 1 sets - 10 reps - 5 hold - Supine Quad Set  - 2 x daily - 6 x weekly - 2 sets - 10 reps - 5 hold - Seated Straight Leg Raise with Quad Contraction  - 2 x daily - 6 x weekly - 2-3 sets - 10 reps - Mini Squat with Chair  - 2 x daily - 6 x weekly - 1-2 sets - 10 reps - Seated  Knee Flexion Stretch  - 2 x daily - 6 x weekly - 1-2 sets - 10 reps - 5 sec hold  ASSESSMENT:  CLINICAL IMPRESSION:  TUG score improved and reduced fall risk threshold.  Dynamic balance/compliant surface balance can continued to improve.   Recommend continued skilled PT services c focus on WB strengthening and dynamic balance improvements.    OBJECTIVE IMPAIRMENTS: decreased activity tolerance, difficulty walking, decreased balance, decreased endurance, decreased mobility, decreased ROM, decreased strength, impaired flexibility, impaired LE use, and pain.  ACTIVITY LIMITATIONS: bending, lifting, carry, locomotion, cleaning, community activity, driving,   PERSONAL FACTORS: R TKA,6/13, asthma, CAD, GERD, headaches, HTN.  are also affecting patient's functional outcome.  REHAB POTENTIAL: Good  CLINICAL DECISION MAKING: Stable/uncomplicated  EVALUATION COMPLEXITY: Low    GOALS: Short term PT Goals Target date: 05/22/2023   Pt will be I and compliant with HEP. Baseline:  Goal status: Met  Pt will decrease pain by 25% overall Baseline: Goal status:  Met  Long term PT goals Target date:06/19/2023   Pt will improve Rt knee PROM 0-110 deg and AROM to  [facility] to improve functional mobility Baseline: Goal status: MET 04/26/23 Pt will improve  hip/knee strength to at least 5-/5 MMT to improve functional strength Baseline: Goal status: New Pt will improve FOTO to at least 60% functional to show improved function Baseline: Goal status: New Pt will reduce pain to overall less than 2-3/10 with usual activity and work activity. Baseline: Goal status: New Pt will be able to ambulate community distances at least 1000 ft WNL gait pattern without complaints Baseline: Goal status: New  PLAN: PT FREQUENCY: 1-3 times per week   PT DURATION: 6-8 weeks  PLANNED INTERVENTIONS (unless contraindicated): aquatic PT, Canalith repositioning, cryotherapy, Electrical stimulation, Iontophoresis with 4 mg/ml dexamethasome, Moist heat, traction, Ultrasound, gait training, Therapeutic exercise, balance training, neuromuscular re-education, patient/family education, prosthetic training, manual techniques, passive ROM, dry needling, taping, vasopnuematic device, vestibular, spinal manipulations, joint manipulations  PLAN FOR NEXT SESSION: Strength testing with dynamometry for knee extension comparison.   NEXT MD VISIT: 05/22/23  Chyrel Masson, PT, DPT, OCS, ATC 05/08/23  10:33 AM

## 2023-05-14 ENCOUNTER — Telehealth: Payer: Self-pay | Admitting: *Deleted

## 2023-05-14 ENCOUNTER — Other Ambulatory Visit: Payer: Self-pay | Admitting: Surgical

## 2023-05-14 ENCOUNTER — Ambulatory Visit: Payer: Medicare Other | Admitting: Physical Therapy

## 2023-05-14 ENCOUNTER — Encounter: Payer: Self-pay | Admitting: Physical Therapy

## 2023-05-14 DIAGNOSIS — R600 Localized edema: Secondary | ICD-10-CM

## 2023-05-14 DIAGNOSIS — M6281 Muscle weakness (generalized): Secondary | ICD-10-CM

## 2023-05-14 DIAGNOSIS — M25561 Pain in right knee: Secondary | ICD-10-CM | POA: Diagnosis not present

## 2023-05-14 DIAGNOSIS — R262 Difficulty in walking, not elsewhere classified: Secondary | ICD-10-CM | POA: Diagnosis not present

## 2023-05-14 MED ORDER — HYDROCODONE-ACETAMINOPHEN 5-325 MG PO TABS: 1.0000 | ORAL_TABLET | Freq: Four times a day (QID) | ORAL | 0 refills | Status: DC | PRN

## 2023-05-14 NOTE — Telephone Encounter (Signed)
 Sent in

## 2023-05-14 NOTE — Telephone Encounter (Signed)
 Call to patient and updated.

## 2023-05-14 NOTE — Therapy (Signed)
OUTPATIENT PHYSICAL THERAPY TREATMENT   Patient Name: Frank [name] MRN: [id number] DOB:[birth year], 72 y.o., male Today's Date: 05/14/2023  END OF SESSION:  PT End of Session - 05/14/23 0904     Visit Number 7    Number of Visits 15    Date for PT Re-Evaluation 06/19/23    Authorization Type UHC MCR    Progress Note Due on Visit 10    PT Start Time [phone]    PT Stop Time 0935    PT Time Calculation (min) 49 min    Activity Tolerance Patient tolerated treatment well    Behavior During Therapy [facility] for tasks assessed/performed               Past Medical History:  Diagnosis Date   Allergy    Asthma    Coronary artery disease    GERD (gastroesophageal reflux disease)    Headache(784.0)    History of kidney stones    Hypertension    Osteoarthritis of knee    right   Past Surgical History:  Procedure Laterality Date   CARDIAC CATHETERIZATION     HERNIA REPAIR  1969, 2009   kidney stones  05/20/1995   KNEE SURGERY  02/26/1989   MITRAL VALVE REPAIR  01/02/2022   NASAL SINUS SURGERY  10/24/1992   RIGHT/LEFT HEART CATH AND CORONARY ANGIOGRAPHY N/A 11/15/2021   Procedure: RIGHT/LEFT HEART CATH AND CORONARY ANGIOGRAPHY;  Surgeon: Orbie Pyo, MD;  Location: [facility] INVASIVE CV LAB;  Service: Cardiovascular;  Laterality: N/A;   ROTATOR CUFF REPAIR Left 04/25/1993   TEE WITHOUT CARDIOVERSION N/A 08/24/2021   Procedure: TRANSESOPHAGEAL ECHOCARDIOGRAM (TEE);  Surgeon: Jodelle Red, MD;  Location: [facility] ENDOSCOPY;  Service: Cardiovascular;  Laterality: N/A;   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   Procedure: LEFT TOTAL KNEE ARTHROPLASTY;  Surgeon: Cammy Copa, MD;  Location: [facility] OR;  Service: Orthopedics;  Laterality: Left;   TOTAL KNEE ARTHROPLASTY Right 04/11/2023   Procedure: RIGHT TOTAL KNEE ARTHROPLASTY-CEMENTED;  Surgeon: Cammy Copa, MD;  Location: [facility] OR;  Service: Orthopedics;  Laterality: Right;   Patient Active Problem List   Diagnosis Date  Noted   S/P total knee replacement, 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   Arthritis of right knee 12/18/2022   Nonrheumatic mitral valve regurgitation s/p repair 2023    Peroneal tendinitis of right lower extremity 04/25/2021   Benign localized prostatic hyperplasia with lower urinary tract symptoms (LUTS) 09/27/2020   Wheezing 09/14/2020   Chondrocalcinosis 03/14/2020   Dyslipidemia 09/23/2017   Bilateral primary osteoarthritis of knee 09/02/2017   Neck pain 02/15/2017   Primary osteoarthritis of right hip 02/15/2017   Tear of right acetabular labrum 02/15/2017   Right leg pain 01/04/2017   Obese 08/17/2015   Elevated blood sugar 08/17/2015   Shingles 09/10/2014   Dyspnea 11/27/2011   HERNIA, UMBILICAL 08/02/2008   Essential hypertension 01/30/2008   Allergic rhinitis 01/30/2008   GERD 01/30/2008   HEADACHE 01/30/2008    PCP: Ardith Dark, MD   REFERRING PROVIDER: Cammy Copa, MD  REFERRING DIAG: [phone] (ICD-10-CM) - S/P total knee replacement, right  THERAPY DIAG:  Acute pain of right knee  Muscle weakness (generalized)  Difficulty walking  Localized edema  Rationale for Evaluation and Treatment: Rehabilitation  ONSET DATE: S/P Right total knee arthroplasty on 04/11/23  SUBJECTIVE:   SUBJECTIVE STATEMENT: Relays most of the pain is at night, had some pain this morning about 6/10 and took pain pill so it is not hurting much upon arrival  to PT  PERTINENT HISTORY: Rt TKA,6/13, asthma, CAD, GERD, headaches, HTN.   PAIN:  NPRS scale: no specific pain upon arrival.  Pain location: Rt knee sides and down shin bone Pain description: cramp, spasm Aggravating factors:  Relieving factors: ice, rest  PRECAUTIONS: None  WEIGHT BEARING RESTRICTIONS: No  FALLS:  Has patient fallen in last 6 months? No  LIVING ENVIRONMENT: Has 3 steps to enter, he says he can do these without too much difficulty  OCCUPATION: retired  PLOF: Independent  PATIENT GOALS: get back to  walking normal, hike again  OBJECTIVE:   DIAGNOSTIC FINDINGS:  04/24/2023 review:  New XR taken 04/24/23, no report was available at time of eval.  PATIENT SURVEYS:  04/24/2023  FOTO 49% functional intake, goal is 60%  COGNITION: 04/24/2023 Overall cognitive status: Within functional limits for tasks assessed     SENSATION: 04/24/2023 [facility]  EDEMA:  04/24/2023 Moderate swelling in Rt knee/leg  MUSCLE LENGTH: 04/24/2023 Hamstrings: Right is tight Maisie Fus test: Right is tight   LOWER EXTREMITY ROM:  Active /Passsive ROM Right 04/24/2023 Right 04/26/23 Left/Right 04/30/2023  Knee flexion 98/103 A: 108 AA: 110 117/117  Knee extension 5/4  0/-1   (Blank rows = not tested)  LOWER EXTREMITY MMT:  MMT in sitting Right 04/24/2023  Hip flexion 4  Hip extension   Hip abduction 4  Hip adduction   Hip internal rotation   Hip external rotation   Knee flexion 4+  Knee extension 4+   (Blank rows = not tested)  FUNCTIONAL TESTS:  05/08/2023:  TUG independent:  10.7 seconds   04/24/2023 EVAL: Timed up and go (TUG): 16.85   GAIT: 05/07/2023: Arrived with [facility] but ambulated independently safely in clinic during visit.   04/24/2023: Distance walked: 100 feet X 2  Comments: arrives with rollator but was able to ambulate without AD but with supervision and antalgic gait on Rt with decreased hip/knee flexion during swing phase                     TODAY'S TREATMENT:                                                                       DATE: 05/14/2023 TherEx UBE UE/LE seat 8 10 mins lvl 3 for ROM Knee extension machine double leg up, Rt leg lowering slowly 2 x 10 10 lbs, then Rt leg only 5# 2X5 Knee flexion machine Rt leg only 15 lbs 2 x 10  Leg press machine DL 09# [id number], then Rt leg only 37# 2X10 Gastroc incline stretch bilateral 30 sec x 3 Forward step up WB on Rt leg 2 x 10,  6 inch step no UE assist.  Seated Rt knee flexion c overpressure Lt leg 15 sec hold x 5   Neuro  Re-ed SLS with contralateral leg step over and back 6 inch hurdle x 15 bilaterally, occasional HHA on bar    Modalities Vasopnuematic device X 10 min, medium compression, 34 deg to Rt knee  DATE: 05/08/2023 TherEx UBE UE/LE seat 8 8 mins lvl 3 for ROM Knee extension machine double leg up, Rt leg lowering slowly 2 x 10 5 lbs  Knee flexion machine Rt leg only 10 lbs  2 x 10  Gastroc incline stretch bilateral 30 sec x 3 Forward step up WB on Rt leg 2 x 15 6 inch step no UE assist.  Seated Rt knee flexion c overpressure Lt leg 15 sec hold x 5   Neuro Re-ed SLS with contralateral leg step over and back 6 inch hurdle x 15 bilaterally, occasional HHA on bar Feet together PF/DF alternating x 20 each way on foam with occasional HHA and SBA from clinician   Modalities Vasopnuematic device X 10 min, medium compression, 34 deg to Rt knee  TODAY'S TREATMENT:                                                                      DATE: 05/07/2023 TherEx UBE UE/LE seat 8 6 mins lvl 3 for ROM Leg press double leg 87 lbs x 15, Rt leg only 31 lbs 2 x 15 Gastroc incline stretch bilateral 30 sec x 3 Lateral step down 4 inch step WB on Rt leg x15   (difficulty noted in control for slow lowering) Supine Rt knee LAQ in 90 deg hip flexion x 15 (cues for home for edema reduction) Supine Rt knee AROM heel slide 5 sec hold with SLR combo x 10  Neuro Re-ed Tandem ambulation fwd/back on foam in // bars with occasional to moderate HHA 6 ft x 6 each way SLS on black mat with contralateral leg touching x 6 each corner, performed bilaterally in // bars with occasional HHA  Modalities Vasopnuematic device X 10 min, medium compression, 34 deg to Rt knee  TODAY'S TREATMENT:                                                                      DATE: 04/30/2023 Recumbent bike Seat 5 for 5 minutes AAROM to AROM Seated knee flexion AAROM (left pushes right into flexion) 10 x 10 seconds Quadriceps sets 10 x 5  seconds  Functional Activities: Sit to stand slow eccentrics 10 x Step-up and over 4, 6 and 8 inch step, slow eccentrics 10 x each Single leg press right only slow eccentrics 10 x slow eccentrics 50#   Vaso right knee Medium 34* 10 minutes   TODAY'S TREATMENT:                                                                      DATE:04/29/2023 TherEx UBE UE/LE seat 8 8 mins lvl 2.5 for ROM Leg press double leg 75 lbs x 15, Rt leg only 31 lbs 2 x 15 Step up forward 4 inch step WB on Rt leg 2 x 10 with single hand assist on bar Seated quad set 5 sec hold x 10 Rt leg   Neuro Re-ed Alt toe tapping 4 inch  step x 15 bilateral Tandem stance 1 min x 1 bilateral occasional HHA on bar  Modalities Vasopnuematic device X 10 min, medium compression, 34 deg to Rt knee   PATIENT EDUCATION: Education details: HEP, PT plan of care Person educated: Patient Education method: Explanation, Demonstration, Verbal cues, and Handouts Education comprehension: verbalized understanding and needs further education   HOME EXERCISE PROGRAM: Access Code: [email or web address] URL: [email or web address] Date: 04/24/2023 Prepared by: Ivery Quale  Exercises - Supine Heel Slide with Strap  - 2 x daily - 6 x weekly - 1 sets - 10 reps - 5 hold - Supine Quad Set  - 2 x daily - 6 x weekly - 2 sets - 10 reps - 5 hold - Seated Straight Leg Raise with Quad Contraction  - 2 x daily - 6 x weekly - 2-3 sets - 10 reps - Mini Squat with Chair  - 2 x daily - 6 x weekly - 1-2 sets - 10 reps - Seated Knee Flexion Stretch  - 2 x daily - 6 x weekly - 1-2 sets - 10 reps - 5 sec hold  ASSESSMENT:  CLINICAL IMPRESSION:  He is overall progressing well with PT, he was able to perform increased strengthening program today with good tolerance to this.     OBJECTIVE IMPAIRMENTS: decreased activity tolerance, difficulty walking, decreased balance, decreased endurance, decreased mobility, decreased ROM, decreased strength,  impaired flexibility, impaired LE use, and pain.  ACTIVITY LIMITATIONS: bending, lifting, carry, locomotion, cleaning, community activity, driving,   PERSONAL FACTORS: R TKA,6/13, asthma, CAD, GERD, headaches, HTN.  are also affecting patient's functional outcome.  REHAB POTENTIAL: Good  CLINICAL DECISION MAKING: Stable/uncomplicated  EVALUATION COMPLEXITY: Low    GOALS: Short term PT Goals Target date: 05/22/2023   Pt will be I and compliant with HEP. Baseline:  Goal status: Met  Pt will decrease pain by 25% overall Baseline: Goal status: Met  Long term PT goals Target date:06/19/2023   Pt will improve Rt knee PROM 0-110 deg and AROM to  [facility] to improve functional mobility Baseline: Goal status: MET 04/26/23 Pt will improve  hip/knee strength to at least 5-/5 MMT to improve functional strength Baseline: Goal status: New Pt will improve FOTO to at least 60% functional to show improved function Baseline: Goal status: New Pt will reduce pain to overall less than 2-3/10 with usual activity and work activity. Baseline: Goal status: New Pt will be able to ambulate community distances at least 1000 ft WNL gait pattern without complaints Baseline: Goal status: New  PLAN: PT FREQUENCY: 1-3 times per week   PT DURATION: 6-8 weeks  PLANNED INTERVENTIONS (unless contraindicated): aquatic PT, Canalith repositioning, cryotherapy, Electrical stimulation, Iontophoresis with 4 mg/ml dexamethasome, Moist heat, traction, Ultrasound, gait training, Therapeutic exercise, balance training, neuromuscular re-education, patient/family education, prosthetic training, manual techniques, passive ROM, dry needling, taping, vasopnuematic device, vestibular, spinal manipulations, joint manipulations  PLAN FOR NEXT SESSION: Strength testing with dynamometry for knee extension comparison.   NEXT MD VISIT: 05/22/23  Ivery Quale, PT, DPT 05/14/23 9:28 AM

## 2023-05-14 NOTE — Telephone Encounter (Signed)
Patient called requesting refill of pain medication. Thank you. 

## 2023-05-16 ENCOUNTER — Telehealth: Payer: Self-pay | Admitting: Rehabilitative and Restorative Service Providers"

## 2023-05-16 ENCOUNTER — Encounter: Payer: Medicare Other | Admitting: Physical Therapy

## 2023-05-16 ENCOUNTER — Encounter: Payer: Medicare Other | Admitting: Rehabilitative and Restorative Service Providers"

## 2023-05-16 NOTE — Telephone Encounter (Signed)
Called after 15 min no show for appointment.  Pt's wife indicated she thought appointment was at 1pm today not 11 am.  Clinic was unable to see patient later today as of this time.  Reminded of next appointment time next week.   Chyrel Masson, PT, DPT, OCS, ATC 05/16/23  11:21 AM

## 2023-05-21 ENCOUNTER — Ambulatory Visit: Payer: Medicare Other | Admitting: Physical Therapy

## 2023-05-21 ENCOUNTER — Encounter: Payer: Self-pay | Admitting: Physical Therapy

## 2023-05-21 DIAGNOSIS — M6281 Muscle weakness (generalized): Secondary | ICD-10-CM

## 2023-05-21 DIAGNOSIS — R262 Difficulty in walking, not elsewhere classified: Secondary | ICD-10-CM | POA: Diagnosis not present

## 2023-05-21 DIAGNOSIS — R600 Localized edema: Secondary | ICD-10-CM | POA: Diagnosis not present

## 2023-05-21 DIAGNOSIS — M25561 Pain in right knee: Secondary | ICD-10-CM | POA: Diagnosis not present

## 2023-05-21 NOTE — Therapy (Signed)
OUTPATIENT PHYSICAL THERAPY TREATMENT   Patient Name: Frank [name] MRN: [id number] DOB:[birth year], 72 y.o., male Today's Date: 05/21/2023  END OF SESSION:  PT End of Session - 05/21/23 0848     Visit Number 8    Number of Visits 15    Date for PT Re-Evaluation 06/19/23    Authorization Type UHC MCR    Progress Note Due on Visit 10    PT Start Time [phone]    PT Stop Time 0930    PT Time Calculation (min) 48 min    Activity Tolerance Patient tolerated treatment well    Behavior During Therapy [facility] for tasks assessed/performed                Past Medical History:  Diagnosis Date   Allergy    Asthma    Coronary artery disease    GERD (gastroesophageal reflux disease)    Headache(784.0)    History of kidney stones    Hypertension    Osteoarthritis of knee    right   Past Surgical History:  Procedure Laterality Date   CARDIAC CATHETERIZATION     HERNIA REPAIR  1969, 2009   kidney stones  05/20/1995   KNEE SURGERY  02/26/1989   MITRAL VALVE REPAIR  01/02/2022   NASAL SINUS SURGERY  10/24/1992   RIGHT/LEFT HEART CATH AND CORONARY ANGIOGRAPHY N/A 11/15/2021   Procedure: RIGHT/LEFT HEART CATH AND CORONARY ANGIOGRAPHY;  Surgeon: Orbie Pyo, MD;  Location: [facility] INVASIVE CV LAB;  Service: Cardiovascular;  Laterality: N/A;   ROTATOR CUFF REPAIR Left 04/25/1993   TEE WITHOUT CARDIOVERSION N/A 08/24/2021   Procedure: TRANSESOPHAGEAL ECHOCARDIOGRAM (TEE);  Surgeon: Jodelle Red, MD;  Location: [facility] ENDOSCOPY;  Service: Cardiovascular;  Laterality: N/A;   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   Procedure: LEFT TOTAL KNEE ARTHROPLASTY;  Surgeon: Cammy Copa, MD;  Location: [facility] OR;  Service: Orthopedics;  Laterality: Left;   TOTAL KNEE ARTHROPLASTY Right 04/11/2023   Procedure: RIGHT TOTAL KNEE ARTHROPLASTY-CEMENTED;  Surgeon: Cammy Copa, MD;  Location: [facility] OR;  Service: Orthopedics;  Laterality: Right;   Patient Active Problem List   Diagnosis Date  Noted   S/P total knee replacement, 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   Arthritis of right knee 12/18/2022   Nonrheumatic mitral valve regurgitation s/p repair 2023    Peroneal tendinitis of right lower extremity 04/25/2021   Benign localized prostatic hyperplasia with lower urinary tract symptoms (LUTS) 09/27/2020   Wheezing 09/14/2020   Chondrocalcinosis 03/14/2020   Dyslipidemia 09/23/2017   Bilateral primary osteoarthritis of knee 09/02/2017   Neck pain 02/15/2017   Primary osteoarthritis of right hip 02/15/2017   Tear of right acetabular labrum 02/15/2017   Right leg pain 01/04/2017   Obese 08/17/2015   Elevated blood sugar 08/17/2015   Shingles 09/10/2014   Dyspnea 11/27/2011   HERNIA, UMBILICAL 08/02/2008   Essential hypertension 01/30/2008   Allergic rhinitis 01/30/2008   GERD 01/30/2008   HEADACHE 01/30/2008    PCP: Ardith Dark, MD   REFERRING PROVIDER: Cammy Copa, MD  REFERRING DIAG: [phone] (ICD-10-CM) - S/P total knee replacement, right  THERAPY DIAG:  Acute pain of right knee  Muscle weakness (generalized)  Difficulty walking  Localized edema  Rationale for Evaluation and Treatment: Rehabilitation  ONSET DATE: S/P Right total knee arthroplasty on 04/11/23  SUBJECTIVE:   SUBJECTIVE STATEMENT: Had pain last night and first thing this morning, no pain now upon arrival  PERTINENT HISTORY: Rt TKA,6/13, asthma, CAD, GERD, headaches, HTN.  PAIN:  NPRS scale: no specific pain upon arrival.  Pain location: Rt knee sides and down shin bone Pain description: cramp, spasm Aggravating factors:  Relieving factors: ice, rest  PRECAUTIONS: None  WEIGHT BEARING RESTRICTIONS: No  FALLS:  Has patient fallen in last 6 months? No  LIVING ENVIRONMENT: Has 3 steps to enter, he says he can do these without too much difficulty  OCCUPATION: retired  PLOF: Independent  PATIENT GOALS: get back to walking normal, hike again  OBJECTIVE:   DIAGNOSTIC FINDINGS:   04/24/2023 review:  New XR taken 04/24/23, no report was available at time of eval.  PATIENT SURVEYS:  04/24/2023  FOTO 49% functional intake, goal is 60%  COGNITION: 04/24/2023 Overall cognitive status: Within functional limits for tasks assessed     SENSATION: 04/24/2023 [facility]  EDEMA:  04/24/2023 Moderate swelling in Rt knee/leg  MUSCLE LENGTH: 04/24/2023 Hamstrings: Right is tight Maisie Fus test: Right is tight   LOWER EXTREMITY ROM:  Active /Passsive ROM Right 04/24/2023 Right 04/26/23 Left/Right 04/30/2023  Knee flexion 98/103 A: 108 AA: 110 117/117  Knee extension 5/4  0/-1   (Blank rows = not tested)  LOWER EXTREMITY MMT:  MMT in sitting Right 04/24/2023 Right/Left 05/21/23  Hip flexion 4   Hip extension    Hip abduction 4   Hip adduction    Hip internal rotation    Hip external rotation    Knee flexion 4+   Knee extension 4+ 31#,34#/35#   (Blank rows = not tested)  FUNCTIONAL TESTS:  05/08/2023:  TUG independent:  10.7 seconds   04/24/2023 EVAL: Timed up and go (TUG): 16.85   GAIT: 05/07/2023: Arrived with [facility] but ambulated independently safely in clinic during visit.   04/24/2023: Distance walked: 100 feet X 2  Comments: arrives with rollator but was able to ambulate without AD but with supervision and antalgic gait on Rt with decreased hip/knee flexion during swing phase                     TODAY'S TREATMENT:                                                                      DATE: 05/21/2023 TherEx UBE UE/LE seat 8 10 mins lvl 3 for ROM Knee extension machine double leg up, Rt leg lowering slowly 2 x 10 10 lbs, then Rt leg only 5# 2X5 Knee flexion machine Rt leg only 15 lbs 2 x 12  Leg press machine DL 16# [id number], then Rt leg only 50# 2X12 Gastroc incline stretch bilateral 30 sec x 2 TRX deep squats 2X10 Forward step up and step down WB Rt leg, X 10 6 inch then progressed to 8 inch X 10    Modalities Vasopnuematic device X 10 min, medium  compression, 34 deg to Rt knee  TODAY'S TREATMENT:  DATE: 05/16/2023 TherEx UBE UE/LE seat 8 10 mins lvl 3 for ROM Knee extension machine double leg up, Rt leg lowering slowly 2 x 10 10 lbs, then Rt leg only 5# 2X5 Knee flexion machine Rt leg only 15 lbs 2 x 10  Leg press machine DL 72# [id number], then Rt leg only 37# 2X10 Gastroc incline stretch bilateral 30 sec x 3 Forward step up WB on Rt leg 2 x 10,  6 inch step no UE assist.  Seated Rt knee flexion c overpressure Lt leg 15 sec hold x 5  Neuro Re-ed SLS with contralateral leg step over and back 6 inch hurdle x 15 bilaterally, occasional HHA on bar  Modalities Vasopnuematic device X 10 min, medium compression, 34 deg to Rt knee  TODAY'S TREATMENT:                                                                      DATE: 05/14/2023 TherEx UBE UE/LE seat 8 10 mins lvl 3 for ROM Knee extension machine double leg up, Rt leg lowering slowly 2 x 10 10 lbs, then Rt leg only 5# 2X5 Knee flexion machine Rt leg only 15 lbs 2 x 10  Leg press machine DL 44# [id number], then Rt leg only 37# 2X10 Gastroc incline stretch bilateral 30 sec x 3 Forward step up WB on Rt leg 2 x 10,  6 inch step no UE assist.  Seated Rt knee flexion c overpressure Lt leg 15 sec hold x 5  Neuro Re-ed SLS with contralateral leg step over and back 6 inch hurdle x 15 bilaterally, occasional HHA on bar  Modalities Vasopnuematic device X 10 min, medium compression, 34 deg to Rt knee  TODAY'S TREATMENT:                                                                      DATE: 05/08/2023 TherEx UBE UE/LE seat 8 8 mins lvl 3 for ROM Knee extension machine double leg up, Rt leg lowering slowly 2 x 10 5 lbs  Knee flexion machine Rt leg only 10 lbs 2 x 10  Gastroc incline stretch bilateral 30 sec x 3 Forward step up WB on Rt leg 2 x 15 6 inch step no UE assist.  Seated Rt knee flexion c overpressure Lt leg 15 sec  hold x 5  Neuro Re-ed SLS with contralateral leg step over and back 6 inch hurdle x 15 bilaterally, occasional HHA on bar Feet together PF/DF alternating x 20 each way on foam with occasional HHA and SBA from clinician  Modalities Vasopnuematic device X 10 min, medium compression, 34 deg to Rt knee   PATIENT EDUCATION: Education details: HEP, PT plan of care Person educated: Patient Education method: Explanation, Demonstration, Verbal cues, and Handouts Education comprehension: verbalized understanding and needs further education   HOME EXERCISE PROGRAM: Access Code: [email or web address] URL: [email or web address] Date: 04/24/2023 Prepared by: Ivery Quale  Exercises - Supine Heel Slide with Strap  - 2 x daily -  6 x weekly - 1 sets - 10 reps - 5 hold - Supine Quad Set  - 2 x daily - 6 x weekly - 2 sets - 10 reps - 5 hold - Seated Straight Leg Raise with Quad Contraction  - 2 x daily - 6 x weekly - 2-3 sets - 10 reps - Mini Squat with Chair  - 2 x daily - 6 x weekly - 1-2 sets - 10 reps - Seated Knee Flexion Stretch  - 2 x daily - 6 x weekly - 1-2 sets - 10 reps - 5 sec hold  ASSESSMENT:  CLINICAL IMPRESSION:  He is making good progress with PT up to this point. He is only missing a little bit of knee extension strength compared to other leg with HHD strength testing today. He is now walking up/down stairs reciprocally and is on track to meet all his PT goals.    OBJECTIVE IMPAIRMENTS: decreased activity tolerance, difficulty walking, decreased balance, decreased endurance, decreased mobility, decreased ROM, decreased strength, impaired flexibility, impaired LE use, and pain.  ACTIVITY LIMITATIONS: bending, lifting, carry, locomotion, cleaning, community activity, driving,   PERSONAL FACTORS: R TKA,6/13, asthma, CAD, GERD, headaches, HTN.  are also affecting patient's functional outcome.  REHAB POTENTIAL: Good  CLINICAL DECISION MAKING: Stable/uncomplicated  EVALUATION  COMPLEXITY: Low    GOALS: Short term PT Goals Target date: 05/22/2023   Pt will be I and compliant with HEP. Baseline:  Goal status: Met  Pt will decrease pain by 25% overall Baseline: Goal status: Met  Long term PT goals Target date:06/19/2023   Pt will improve Rt knee PROM 0-110 deg and AROM to  [facility] to improve functional mobility Baseline: Goal status: MET 04/26/23 Pt will improve  hip/knee strength to at least 5-/5 MMT to improve functional strength Baseline: Goal status:MET 05/21/23 Pt will improve FOTO to at least 60% functional to show improved function Baseline: Goal status: ongoing Pt will reduce pain to overall less than 2-3/10 with usual activity and work activity. Baseline: Goal status: ongoing Pt will be able to ambulate community distances at least 1000 ft WNL gait pattern without complaints Baseline: Goal status: MET 05/21/23 PLAN: PT FREQUENCY: 1-3 times per week   PT DURATION: 6-8 weeks  PLANNED INTERVENTIONS (unless contraindicated): aquatic PT, Canalith repositioning, cryotherapy, Electrical stimulation, Iontophoresis with 4 mg/ml dexamethasome, Moist heat, traction, Ultrasound, gait training, Therapeutic exercise, balance training, neuromuscular re-education, patient/family education, prosthetic training, manual techniques, passive ROM, dry needling, taping, vasopnuematic device, vestibular, spinal manipulations, joint manipulations  PLAN FOR NEXT SESSION: strength progressions as tolerated.    Ivery Quale, PT, DPT 05/21/23 8:49 AM

## 2023-05-22 ENCOUNTER — Encounter: Payer: Self-pay | Admitting: Orthopedic Surgery

## 2023-05-22 ENCOUNTER — Ambulatory Visit (INDEPENDENT_AMBULATORY_CARE_PROVIDER_SITE_OTHER): Payer: Medicare Other | Admitting: Orthopedic Surgery

## 2023-05-22 ENCOUNTER — Telehealth: Payer: Self-pay | Admitting: *Deleted

## 2023-05-22 DIAGNOSIS — Z96651 Presence of right artificial knee joint: Secondary | ICD-10-CM

## 2023-05-22 MED ORDER — HYDROCODONE-ACETAMINOPHEN 5-325 MG PO TABS: 1.0000 | ORAL_TABLET | Freq: Four times a day (QID) | ORAL | 0 refills | Status: DC | PRN

## 2023-05-22 NOTE — Progress Notes (Signed)
Post-Op Visit Note   [name]           Date of Birth: 05/14/1951           MRN: [id number] Visit Date: 05/22/2023 PCP: Ardith Dark, MD   Assessment & Plan:  Chief Complaint:  Chief Complaint  Patient presents with   Right Knee - Routine Post Op    right total knee arthroplasty on 04/11/2023   Visit Diagnoses: No diagnosis found.  Plan: Frank [name] is a 72 year old patient who is now about 5 weeks out right total knee replacement.  Doing well.  On exam range of motion 0-1 17.  Negative Homans no calf tenderness.  Does have some right leg swelling which she had with the other side.  He is able to sleep better.  Starting to work now and going up steps.  Not using a cane.  Does take Norco at night sometimes.  That is refilled today.  Plan at this time is 25 pound lifting limit with 6-week return with Frank [name] for final clinical check.  Okay to discontinue physical therapy.  Follow-Up Instructions: No follow-ups on file.   Orders:  No orders of the defined types were placed in this encounter.  Meds ordered this encounter  Medications   HYDROcodone-acetaminophen (NORCO/VICODIN) 5-325 MG tablet    Sig: Take 1 tablet by mouth every 6 (six) hours as needed for moderate pain.    Dispense:  30 tablet    Refill:  0    Imaging: No results found.  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   Arthritis of right knee 12/18/2022   Nonrheumatic mitral valve regurgitation s/p repair 2023    Peroneal tendinitis of right lower extremity 04/25/2021   Benign localized prostatic hyperplasia with lower urinary tract symptoms (LUTS) 09/27/2020   Wheezing 09/14/2020   Chondrocalcinosis 03/14/2020   Dyslipidemia 09/23/2017   Bilateral primary osteoarthritis of knee 09/02/2017   Neck pain 02/15/2017   Primary osteoarthritis of right hip 02/15/2017   Tear of right acetabular labrum 02/15/2017   Right leg pain  01/04/2017   Obese 08/17/2015   Elevated blood sugar 08/17/2015   Shingles 09/10/2014   Dyspnea 11/27/2011   HERNIA, UMBILICAL 08/02/2008   Essential hypertension 01/30/2008   Allergic rhinitis 01/30/2008   GERD 01/30/2008   HEADACHE 01/30/2008   Past Medical History:  Diagnosis Date   Allergy    Asthma    Coronary artery disease    GERD (gastroesophageal reflux disease)    Headache(784.0)    History of kidney stones    Hypertension    Osteoarthritis of knee    right    Family History  Problem Relation Age of Onset   Kidney disease Mother    COPD Father    Pneumonia Father    Cardiomyopathy Brother    Hypertension Sister    Heart disease Brother    Hypertension Brother    Hypertension Brother    Hypertension Sister    Diabetes Sister    Osteoarthritis Sister     Past Surgical History:  Procedure Laterality Date   CARDIAC CATHETERIZATION     HERNIA REPAIR  1969, 2009   kidney stones  05/20/1995   KNEE SURGERY  02/26/1989   MITRAL VALVE REPAIR  01/02/2022   NASAL SINUS SURGERY  10/24/1992   RIGHT/LEFT HEART CATH AND CORONARY ANGIOGRAPHY N/A 11/15/2021   Procedure: RIGHT/LEFT HEART CATH AND CORONARY  ANGIOGRAPHY;  Surgeon: Orbie Pyo, MD;  Location: [facility] INVASIVE CV LAB;  Service: Cardiovascular;  Laterality: N/A;   ROTATOR CUFF REPAIR Left 04/25/1993   TEE WITHOUT CARDIOVERSION N/A 08/24/2021   Procedure: TRANSESOPHAGEAL ECHOCARDIOGRAM (TEE);  Surgeon: Jodelle Red, MD;  Location: [facility] ENDOSCOPY;  Service: Cardiovascular;  Laterality: N/A;   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   Procedure: LEFT TOTAL KNEE ARTHROPLASTY;  Surgeon: Cammy Copa, MD;  Location: [facility] OR;  Service: Orthopedics;  Laterality: Left;   TOTAL KNEE ARTHROPLASTY Right 04/11/2023   Procedure: RIGHT TOTAL KNEE ARTHROPLASTY-CEMENTED;  Surgeon: Cammy Copa, MD;  Location: [facility] OR;  Service: Orthopedics;  Laterality: Right;   Social History   Occupational History   Not on  file  Tobacco Use   Smoking status: Former    Current packs/day: 0.00    Types: Cigarettes    Quit date: 1972    Years since quitting: 52.5   Smokeless tobacco: Never   Tobacco comments:    quit in 1972  Vaping Use   Vaping status: Never Used  Substance and Sexual Activity   Alcohol use: No   Drug use: No   Sexual activity: Not on file

## 2023-05-22 NOTE — Telephone Encounter (Signed)
Patient called and his pharmacy received refill of Norco today, but they are out of it and stated this will have to be canceled and resent to [facility]. Phone# [phone]. Thank you.

## 2023-05-23 ENCOUNTER — Other Ambulatory Visit: Payer: Self-pay | Admitting: Surgical

## 2023-05-23 ENCOUNTER — Encounter: Payer: Self-pay | Admitting: Physical Therapy

## 2023-05-23 ENCOUNTER — Ambulatory Visit: Payer: Medicare Other | Admitting: Physical Therapy

## 2023-05-23 DIAGNOSIS — M6281 Muscle weakness (generalized): Secondary | ICD-10-CM

## 2023-05-23 DIAGNOSIS — R600 Localized edema: Secondary | ICD-10-CM | POA: Diagnosis not present

## 2023-05-23 DIAGNOSIS — M25561 Pain in right knee: Secondary | ICD-10-CM

## 2023-05-23 DIAGNOSIS — R262 Difficulty in walking, not elsewhere classified: Secondary | ICD-10-CM

## 2023-05-23 MED ORDER — HYDROCODONE-ACETAMINOPHEN 5-325 MG PO TABS: 1.0000 | ORAL_TABLET | Freq: Four times a day (QID) | ORAL | 0 refills | Status: DC | PRN

## 2023-05-23 NOTE — Telephone Encounter (Signed)
Patient aware this has been sent in

## 2023-05-23 NOTE — Telephone Encounter (Signed)
[name], can you resend this please. I asked Dr. August Saucer yesterday, but he maybe didn't see it. It will have to be canceled and resent to a pharmacy that has it. Thank you.

## 2023-05-23 NOTE — Therapy (Signed)
OUTPATIENT PHYSICAL THERAPY TREATMENT/Discharge PHYSICAL THERAPY DISCHARGE SUMMARY  Visits from Start of Care: 9  Current functional level related to goals / functional outcomes: See below   Remaining deficits: See below   Education / Equipment: HEP  Plan:  Patient goals were met. Patient is being discharged due to meeting PT goals.       Patient Name: Frank [name] MRN: [id number] DOB:1951/02/11, 71 y.o., male Today's Date: 05/23/2023  END OF SESSION:  PT End of Session - 05/23/23 0854     Visit Number 9    Number of Visits 15    Date for PT Re-Evaluation 06/19/23    Authorization Type [employer] MCR    Progress Note Due on Visit 10    PT Start Time [phone]    PT Stop Time 0930    PT Time Calculation (min) 44 min    Activity Tolerance Patient tolerated treatment well    Behavior During Therapy [facility] for tasks assessed/performed                Past Medical History:  Diagnosis Date   Allergy    Asthma    Coronary artery disease    GERD (gastroesophageal reflux disease)    Headache(784.0)    History of kidney stones    Hypertension    Osteoarthritis of knee    right   Past Surgical History:  Procedure Laterality Date   CARDIAC CATHETERIZATION     HERNIA REPAIR  1969, 2009   kidney stones  05/20/1995   KNEE SURGERY  02/26/1989   MITRAL VALVE REPAIR  01/02/2022   NASAL SINUS SURGERY  10/24/1992   RIGHT/LEFT HEART CATH AND CORONARY ANGIOGRAPHY N/A 11/15/2021   Procedure: RIGHT/LEFT HEART CATH AND CORONARY ANGIOGRAPHY;  Surgeon: Orbie Pyo, MD;  Location: [facility] INVASIVE CV LAB;  Service: Cardiovascular;  Laterality: N/A;   ROTATOR CUFF REPAIR Left 04/25/1993   TEE WITHOUT CARDIOVERSION N/A 08/24/2021   Procedure: TRANSESOPHAGEAL ECHOCARDIOGRAM (TEE);  Surgeon: Jodelle Red, MD;  Location: [facility] ENDOSCOPY;  Service: Cardiovascular;  Laterality: N/A;   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   Procedure: LEFT TOTAL KNEE ARTHROPLASTY;  Surgeon: Cammy Copa, MD;  Location: [facility] OR;  Service: Orthopedics;  Laterality: Left;   TOTAL KNEE ARTHROPLASTY Right 04/11/2023   Procedure: RIGHT TOTAL KNEE ARTHROPLASTY-CEMENTED;  Surgeon: Cammy Copa, MD;  Location: [facility] OR;  Service: Orthopedics;  Laterality: Right;   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   Arthritis of right knee 12/18/2022   Nonrheumatic mitral valve regurgitation s/p repair 2023    Peroneal tendinitis of right lower extremity 04/25/2021   Benign localized prostatic hyperplasia with lower urinary tract symptoms (LUTS) 09/27/2020   Wheezing 09/14/2020   Chondrocalcinosis 03/14/2020   Dyslipidemia 09/23/2017   Bilateral primary osteoarthritis of knee 09/02/2017   Neck pain 02/15/2017   Primary osteoarthritis of right hip 02/15/2017   Tear of right acetabular labrum 02/15/2017   Right leg pain 01/04/2017   Obese 08/17/2015   Elevated blood sugar 08/17/2015   Shingles 09/10/2014   Dyspnea 11/27/2011   HERNIA, UMBILICAL 08/02/2008   Essential hypertension 01/30/2008   Allergic rhinitis 01/30/2008   GERD 01/30/2008   HEADACHE 01/30/2008    PCP: Ardith Dark, MD   REFERRING PROVIDER: Cammy Copa, MD  REFERRING DIAG: [phone] (ICD-10-CM) - S/P total knee replacement, right  THERAPY DIAG:  Acute pain of right knee  Muscle weakness (generalized)  Difficulty walking  Localized edema  Rationale for Evaluation and Treatment: Rehabilitation  ONSET DATE: S/P Right total knee arthroplasty on 04/11/23  SUBJECTIVE:   SUBJECTIVE STATEMENT: He can walk over a mile without difficulty. He says MD had good report for him and will now allow him to lift up to 25#. He feels ready to discharge from PT today.  PERTINENT HISTORY: Rt TKA,6/13, asthma, CAD, GERD, headaches, HTN.   PAIN:  NPRS scale: no specific pain upon arrival.  Pain location: Rt knee sides and down shin bone Pain description:  cramp, spasm Aggravating factors:  Relieving factors: ice, rest  PRECAUTIONS: None  WEIGHT BEARING RESTRICTIONS: No  FALLS:  Has patient fallen in last 6 months? No  LIVING ENVIRONMENT: Has 3 steps to enter, he says he can do these without too much difficulty  OCCUPATION: retired  PLOF: Independent  PATIENT GOALS: get back to walking normal, hike again  OBJECTIVE:   DIAGNOSTIC FINDINGS:  04/24/2023 review:  New XR taken 04/24/23, no report was available at time of eval.  PATIENT SURVEYS:  04/24/2023  FOTO 49% functional intake, goal is 60% 05/23/23 FOTO improved to 67% and met goal  COGNITION: 04/24/2023 Overall cognitive status: Within functional limits for tasks assessed     SENSATION: 04/24/2023 [facility]  EDEMA:  04/24/2023 Moderate swelling in Rt knee/leg  MUSCLE LENGTH: 04/24/2023 Hamstrings: Right is tight Maisie Fus test: Right is tight   LOWER EXTREMITY ROM:  Active /Passsive ROM Right 04/24/2023 Right 04/26/23 Left/Right 04/30/2023 Right   Knee flexion 98/103 A: 108 AA: 110 117/117 A:110 P:120  Knee extension 5/4  0/-1 A:0   (Blank rows = not tested)  LOWER EXTREMITY MMT:  MMT in sitting Right 04/24/2023 Right/Left 05/21/23  Hip flexion 4   Hip extension    Hip abduction 4   Hip adduction    Hip internal rotation    Hip external rotation    Knee flexion 4+   Knee extension 4+ 31#,34#/35#   (Blank rows = not tested)  FUNCTIONAL TESTS:  05/08/2023:  TUG independent:  10.7 seconds   04/24/2023 EVAL: Timed up and go (TUG): 16.85   GAIT: 05/07/2023: Arrived with [facility] but ambulated independently safely in clinic during visit.   04/24/2023: Distance walked: 100 feet X 2  Comments: arrives with rollator but was able to ambulate without AD but with supervision and antalgic gait on Rt with decreased hip/knee flexion during swing phase                     TODAY'S TREATMENT:                                                                      DATE:  05/21/2023 TherEx UBE UE/LE seat 8 10 mins lvl 3 for ROM Lifting 25# floor to waist X 10, then suitcase carry one lap each arm 25# Knee extension machine double leg up, Rt leg lowering slowly 2 x 15 10 lbs, then Rt leg only 5# 2X5 Knee flexion machine Rt leg only 15 lbs 2 x 12  Leg press machine DL 82# [id number], then Rt leg only 50# 2X12 TRX deep squats 2X10 Stairs in clinic hall up/down reciprocally 1 flight    PATIENT EDUCATION: Education  details: HEP, PT plan of care Person educated: Patient Education method: Explanation, Demonstration, Verbal cues, and Handouts Education comprehension: verbalized understanding and needs further education   HOME EXERCISE PROGRAM: Access Code: [email or web address] URL: [email or web address] Date: 04/24/2023 Prepared by: Ivery Quale  Exercises - Supine Heel Slide with Strap  - 2 x daily - 6 x weekly - 1 sets - 10 reps - 5 hold - Supine Quad Set  - 2 x daily - 6 x weekly - 2 sets - 10 reps - 5 hold - Seated Straight Leg Raise with Quad Contraction  - 2 x daily - 6 x weekly - 2-3 sets - 10 reps - Mini Squat with Chair  - 2 x daily - 6 x weekly - 1-2 sets - 10 reps - Seated Knee Flexion Stretch  - 2 x daily - 6 x weekly - 1-2 sets - 10 reps - 5 sec hold  ASSESSMENT:  CLINICAL IMPRESSION:  He had good report from MD and can now lift up to 25# which he did today in clinic without any trouble. He has now met all his PT goals and will be discharged early today. He  had no further questions or concerns.    OBJECTIVE IMPAIRMENTS: decreased activity tolerance, difficulty walking, decreased balance, decreased endurance, decreased mobility, decreased ROM, decreased strength, impaired flexibility, impaired LE use, and pain.  ACTIVITY LIMITATIONS: bending, lifting, carry, locomotion, cleaning, community activity, driving,   PERSONAL FACTORS: R TKA,6/13, asthma, CAD, GERD, headaches, HTN.  are also affecting patient's functional outcome.  REHAB POTENTIAL:  Good  CLINICAL DECISION MAKING: Stable/uncomplicated  EVALUATION COMPLEXITY: Low    GOALS: Short term PT Goals Target date: 05/22/2023   Pt will be I and compliant with HEP. Baseline:  Goal status: Met  Pt will decrease pain by 25% overall Baseline: Goal status: Met  Long term PT goals Target date:06/19/2023   Pt will improve Rt knee PROM 0-110 deg and AROM to  [facility] to improve functional mobility Baseline: Goal status: MET 04/26/23 Pt will improve  hip/knee strength to at least 5-/5 MMT to improve functional strength Baseline: Goal status:MET 05/21/23 Pt will improve FOTO to at least 60% functional to show improved function Baseline: Goal status: MET 05/23/23 Pt will reduce pain to overall less than 2-3/10 with usual activity and work activity. Baseline: Goal status: MET 05/23/23 Pt will be able to ambulate community distances at least 1000 ft WNL gait pattern without complaints Baseline: Goal status: MET 05/21/23 PLAN: PT FREQUENCY: 1-3 times per week   PT DURATION: 6-8 weeks  PLANNED INTERVENTIONS (unless contraindicated): aquatic PT, Canalith repositioning, cryotherapy, Electrical stimulation, Iontophoresis with 4 mg/ml dexamethasome, Moist heat, traction, Ultrasound, gait training, Therapeutic exercise, balance training, neuromuscular re-education, patient/family education, prosthetic training, manual techniques, passive ROM, dry needling, taping, vasopnuematic device, vestibular, spinal manipulations, joint manipulations  PLAN FOR NEXT SESSION: DC today   Ivery Quale, PT, DPT 05/23/23 8:55 AM

## 2023-05-23 NOTE — Telephone Encounter (Signed)
Sent in

## 2023-05-28 ENCOUNTER — Encounter: Payer: Medicare Other | Admitting: Physical Therapy

## 2023-05-30 ENCOUNTER — Encounter: Payer: Medicare Other | Admitting: Physical Therapy

## 2023-05-30 ENCOUNTER — Other Ambulatory Visit: Payer: Self-pay | Admitting: Surgical

## 2023-05-30 ENCOUNTER — Telehealth: Payer: Self-pay | Admitting: *Deleted

## 2023-05-30 MED ORDER — HYDROCODONE-ACETAMINOPHEN 5-325 MG PO TABS: 1.0000 | ORAL_TABLET | Freq: Three times a day (TID) | ORAL | 0 refills | Status: DC | PRN

## 2023-05-30 NOTE — Telephone Encounter (Signed)
Sent in earlier

## 2023-05-30 NOTE — Telephone Encounter (Signed)
Call requesting refill of pain medication. His normal pharmacy on chart still doesn't have Norco in stock- please send [facility]. Thank you.

## 2023-05-30 NOTE — Telephone Encounter (Signed)
Sent in

## 2023-05-30 NOTE — Telephone Encounter (Signed)
[facility] [place] [facility]. [facility] [place] [facility] it. Could you re-do this medication to there and recall the other? Thank you.

## 2023-05-30 NOTE — Progress Notes (Signed)
No refill until 06/07/23

## 2023-06-03 ENCOUNTER — Ambulatory Visit: Payer: Medicare Other | Admitting: Surgical

## 2023-06-03 ENCOUNTER — Other Ambulatory Visit (INDEPENDENT_AMBULATORY_CARE_PROVIDER_SITE_OTHER): Payer: Medicare Other

## 2023-06-03 ENCOUNTER — Telehealth: Payer: Self-pay | Admitting: *Deleted

## 2023-06-03 DIAGNOSIS — Z96651 Presence of right artificial knee joint: Secondary | ICD-10-CM

## 2023-06-03 NOTE — Progress Notes (Unsigned)
Office Visit Note   [name]           Date of Birth: 06/27/1951           MRN: [id number] Visit Date: 06/03/2023 Requested by: Ardith Dark, MD [address] [place],  [place] [address] PCP: Ardith Dark, MD  Subjective: Chief Complaint  Patient presents with  . Right Knee - Pain    HPI: DUGLAS [name] is a 72 y.o. male who presents to the office reporting right knee pain.  Patient states that he had onset of pain yesterday without any history of injury.  Localizes pain to the lateral aspect of the knee primarily around the lateral femoral condyle but he does have some pain little bit more posteriorly.  He denies any fevers or chills.  No new groin pain though he does have some chronic groin pain.  No radicular leg pain or numbness/tingling.  Pain does not travel past the knee.  No calf pain.  He has occasional mechanical clicking sensation on the lateral aspect of the knee as well.  No change in the appearance of his incision or any drainage from the incision.  He has been ambulatory and it does not really bother him to bear weight; he is walking without limp.  It does bother him though when he is driving any switches his foot from the gas to the brake and vice versa.  This is only been going on since Sunday..                ROS: All systems reviewed are negative as they relate to the chief complaint within the history of present illness.  Patient denies fevers or chills.  Assessment & Plan: Visit Diagnoses:  1. S/P total knee replacement, right     Plan: ***  Follow-Up Instructions: No follow-ups on file.   Orders:  Orders Placed This Encounter  Procedures  . XR KNEE 3 VIEW RIGHT  . Ambulatory referral to Physical Therapy   No orders of the defined types were placed in this encounter.     Procedures: No procedures performed   Clinical Data: No additional findings.  Objective: Vital Signs: There were no vitals taken for this visit.  Physical  Exam:  Constitutional: Patient appears well-developed HEENT:  Head: Normocephalic Eyes:EOM are normal Neck: Normal range of motion Cardiovascular: Normal rate Pulmonary/chest: Effort normal Neurologic: Patient is alert Skin: Skin is warm Psychiatric: Patient has normal mood and affect  Ortho Exam: ***  Specialty Comments:  No specialty comments available.  Imaging: No results found.   PMFS History: Patient Active Problem List   Diagnosis Date Noted  . S/P total knee replacement, 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  . Arthritis of right knee 12/18/2022  . Nonrheumatic mitral valve regurgitation s/p repair 2023   . Peroneal tendinitis of right lower extremity 04/25/2021  . Benign localized prostatic hyperplasia with lower urinary tract symptoms (LUTS) 09/27/2020  . Wheezing 09/14/2020  . Chondrocalcinosis 03/14/2020  . Dyslipidemia 09/23/2017  . Bilateral primary osteoarthritis of knee 09/02/2017  . Neck pain 02/15/2017  . Primary osteoarthritis of right hip 02/15/2017  . Tear of right acetabular labrum 02/15/2017  . Right leg pain 01/04/2017  . Obese 08/17/2015  . Elevated blood sugar 08/17/2015  . Shingles 09/10/2014  . Dyspnea 11/27/2011  . HERNIA, UMBILICAL 08/02/2008  . Essential hypertension 01/30/2008  . Allergic rhinitis 01/30/2008  . GERD 01/30/2008  . HEADACHE 01/30/2008   Past Medical History:  Diagnosis Date  .  Allergy   . Asthma   . Coronary artery disease   . GERD (gastroesophageal reflux disease)   . Headache(784.0)   . History of kidney stones   . Hypertension   . Osteoarthritis of knee    right    Family History  Problem Relation Age of Onset  . Kidney disease Mother   . COPD Father   . Pneumonia Father   . Cardiomyopathy Brother   . Hypertension Sister   . Heart disease Brother   . Hypertension Brother   . Hypertension Brother   . Hypertension Sister   . Diabetes Sister   . Osteoarthritis Sister     Past Surgical History:  Procedure Laterality  Date  . CARDIAC CATHETERIZATION    . HERNIA REPAIR  1969, 2009  . kidney stones  05/20/1995  . KNEE SURGERY  02/26/1989  . MITRAL VALVE REPAIR  01/02/2022  . NASAL SINUS SURGERY  10/24/1992  . RIGHT/LEFT HEART CATH AND CORONARY ANGIOGRAPHY N/A 11/15/2021   Procedure: RIGHT/LEFT HEART CATH AND CORONARY ANGIOGRAPHY;  Surgeon: Orbie Pyo, MD;  Location: [facility] INVASIVE CV LAB;  Service: Cardiovascular;  Laterality: N/A;  . ROTATOR CUFF REPAIR Left 04/25/1993  . TEE WITHOUT CARDIOVERSION N/A 08/24/2021   Procedure: TRANSESOPHAGEAL ECHOCARDIOGRAM (TEE);  Surgeon: Jodelle Red, MD;  Location: [facility] ENDOSCOPY;  Service: Cardiovascular;  Laterality: N/A;  . 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   Procedure: LEFT TOTAL KNEE ARTHROPLASTY;  Surgeon: Cammy Copa, MD;  Location: [facility] OR;  Service: Orthopedics;  Laterality: Left;  . TOTAL KNEE ARTHROPLASTY Right 04/11/2023   Procedure: RIGHT TOTAL KNEE ARTHROPLASTY-CEMENTED;  Surgeon: Cammy Copa, MD;  Location: [facility] OR;  Service: Orthopedics;  Laterality: Right;   Social History   Occupational History  . Not on file  Tobacco Use  . Smoking status: Former    Current packs/day: 0.00    Types: Cigarettes    Quit date: 1972    Years since quitting: 52.6  . Smokeless tobacco: Never  . Tobacco comments:    quit in 1972  Vaping Use  . Vaping status: Never Used  Substance and Sexual Activity  . Alcohol use: No  . Drug use: No  . Sexual activity: Not on file

## 2023-06-03 NOTE — Telephone Encounter (Signed)
Scheduled

## 2023-06-03 NOTE — Telephone Encounter (Signed)
Have him see luke today thx may be from back - agree with decreasing activity

## 2023-06-03 NOTE — Telephone Encounter (Signed)
Patient called this morning stating he is experiencing a new pain in knee. He describes pain on lateral side of Right knee radiating down toward calf that is concerning to him. He walks typically up to 2 miles daily and I told him to rest and ice to see how it responds. Surgery was 04/11/23. He asked if he needed to be seen. Thought I'd refer to you and [name]?

## 2023-06-04 ENCOUNTER — Encounter: Payer: Self-pay | Admitting: Surgical

## 2023-06-04 NOTE — Therapy (Signed)
OUTPATIENT PHYSICAL THERAPY LOWER EXTREMITY EVALUATION   Patient Name: Frank [name] MRN: [id number] DOB:1951/01/23, 72 y.o., male Today's Date: 06/04/2023  END OF SESSION:    Past Medical History:  Diagnosis Date   Allergy    Asthma    Coronary artery disease    GERD (gastroesophageal reflux disease)    Headache(784.0)    History of kidney stones    Hypertension    Osteoarthritis of knee    right   Past Surgical History:  Procedure Laterality Date   CARDIAC CATHETERIZATION     HERNIA REPAIR  1969, 2009   kidney stones  05/20/1995   KNEE SURGERY  02/26/1989   MITRAL VALVE REPAIR  01/02/2022   NASAL SINUS SURGERY  10/24/1992   RIGHT/LEFT HEART CATH AND CORONARY ANGIOGRAPHY N/A 11/15/2021   Procedure: RIGHT/LEFT HEART CATH AND CORONARY ANGIOGRAPHY;  Surgeon: Orbie Pyo, MD;  Location: [facility];  Service: Cardiovascular;  Laterality: N/A;   ROTATOR CUFF REPAIR Left 04/25/1993   TEE WITHOUT CARDIOVERSION N/A 08/24/2021   Procedure: TRANSESOPHAGEAL ECHOCARDIOGRAM (TEE);  Surgeon: Jodelle Red, MD;  Location: [facility] ENDOSCOPY;  Service: Cardiovascular;  Laterality: N/A;   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   Procedure: LEFT TOTAL KNEE ARTHROPLASTY;  Surgeon: Cammy Copa, MD;  Location: [facility] OR;  Service: Orthopedics;  Laterality: Left;   TOTAL KNEE ARTHROPLASTY Right 04/11/2023   Procedure: RIGHT TOTAL KNEE ARTHROPLASTY-CEMENTED;  Surgeon: Cammy Copa, MD;  Location: [facility] OR;  Service: Orthopedics;  Laterality: Right;   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   Arthritis of right knee 12/18/2022   Nonrheumatic mitral valve regurgitation s/p repair 2023    Peroneal tendinitis of right lower extremity 04/25/2021   Benign localized prostatic hyperplasia with lower urinary tract symptoms (LUTS) 09/27/2020   Wheezing 09/14/2020   Chondrocalcinosis 03/14/2020   Dyslipidemia 09/23/2017    Bilateral primary osteoarthritis of knee 09/02/2017   Neck pain 02/15/2017   Primary osteoarthritis of right hip 02/15/2017   Tear of right acetabular labrum 02/15/2017   Right leg pain 01/04/2017   Obese 08/17/2015   Elevated blood sugar 08/17/2015   Shingles 09/10/2014   Dyspnea 11/27/2011   HERNIA, UMBILICAL 08/02/2008   Essential hypertension 01/30/2008   Allergic rhinitis 01/30/2008   GERD 01/30/2008   HEADACHE 01/30/2008    PCP: Ardith Dark, MD   REFERRING PROVIDER: Cammy Copa, MD  REFERRING DIAG: [phone] (ICD-10-CM) - S/P total knee replacement, right  THERAPY DIAG:  No diagnosis found.  Rationale for Evaluation and Treatment: Rehabilitation  ONSET DATE: S/P Right total knee arthroplasty on 04/11/23  SUBJECTIVE:   SUBJECTIVE STATEMENT: He had Rt TKA and has associated pain, swelling, cramp and spasm. He has big bruise around thigh where tourniquet was.   PERTINENT HISTORY: R TKA,6/13, asthma, CAD, GERD, headaches, HTN.   PAIN:  Are you having pain? Yes: NPRS scale: 7/10 Pain location: Rt knee sides and down shin bone Pain description: cramp, spasm Aggravating factors: extending knee and turning foot, flexing knee Relieving factors: ice, rest  PRECAUTIONS: None  WEIGHT BEARING RESTRICTIONS: No  FALLS:  Has patient fallen in last 6 months? No  LIVING ENVIRONMENT: Has 3 steps to enter, he says he can do these without too much difficulty  OCCUPATION: retired  PLOF: Independent  PATIENT GOALS: get back to walking normal, hike again  OBJECTIVE:   DIAGNOSTIC FINDINGS:  New XR taken 04/24/23, no report  was available at time of eval.  PATIENT SURVEYS:  No FOTO today (1x visit)  COGNITION: Overall cognitive status: Within functional limits for tasks assessed     SENSATION: WFL  EDEMA:    MUSCLE LENGTH:    LOWER EXTREMITY ROM:  Active ROM Right eval   Hip flexion    Hip extension    Hip abduction    Hip adduction     Hip internal rotation    Hip external rotation    Knee flexion    Knee extension    Ankle dorsiflexion    Ankle plantarflexion    Ankle inversion    Ankle eversion     (Blank rows = not tested)  LOWER EXTREMITY MMT:  MMT in sitting Right eval Left eval  Hip flexion    Hip extension    Hip abduction    Hip adduction    Hip internal rotation    Hip external rotation    Knee flexion    Knee extension    Ankle dorsiflexion    Ankle plantarflexion    Ankle inversion    Ankle eversion     (Blank rows = not tested)  LOWER EXTREMITY SPECIAL TESTS:  None   FUNCTIONAL TESTS:  none  GAIT: 06/05/2023 Independent ambulation    TODAY'S TREATMENT:                                                                     DATE:  06/04/2023 Therex:    HEP instruction/performance c cues for techniques, handout provided.  Trial set performed of each for comprehension and symptom assessment.  See below for exercise list     PATIENT EDUCATION: Education details: HEP, PT plan of care Person educated: Patient Education method: Explanation, Demonstration, Verbal cues, and Handouts Education comprehension: verbalized understanding and needs further education   HOME EXERCISE PROGRAM: Access Code: [email or web address] URL: [email or web address] Date: 04/24/2023 Prepared by: Ivery Quale  Exercises - Supine Heel Slide with Strap  - 2 x daily - 6 x weekly - 1 sets - 10 reps - 5 hold - Supine Quad Set  - 2 x daily - 6 x weekly - 2 sets - 10 reps - 5 hold - Seated Straight Leg Raise with Quad Contraction  - 2 x daily - 6 x weekly - 2-3 sets - 10 reps - Mini Squat with Chair  - 2 x daily - 6 x weekly - 1-2 sets - 10 reps - Seated Knee Flexion Stretch  - 2 x daily - 6 x weekly - 1-2 sets - 10 reps - 5 sec hold  ASSESSMENT:  CLINICAL IMPRESSION:  Patient is a 72 y.o. who comes to clinic with complaints of Rt lateral thigh symptoms s/p recent TKA with mobility, strength deficits that  impair their ability to perform usual daily and recreational functional activities without increase difficulty/symptoms at this time.  Patient to benefit from skilled PT services to address impairments and limitations to improve to previous level of function without restriction secondary to condition.    OBJECTIVE IMPAIRMENTS: decreased activity tolerance, difficulty walking, decreased balance, decreased endurance, decreased mobility, decreased ROM, decreased strength, impaired flexibility, impaired LE use, and pain.  ACTIVITY LIMITATIONS: bending, lifting, carry, locomotion, cleaning, community activity, driving,  PERSONAL FACTORS: R TKA,6/13, asthma, CAD, GERD, headaches, HTN.  are also affecting patient's functional outcome.  REHAB POTENTIAL: Good  CLINICAL DECISION MAKING: Stable/uncomplicated  EVALUATION COMPLEXITY: Low    GOALS: Short term PT Goals Target date: 06/19/2023   Pt will be I and compliant with HEP.  Goal status: New   PLAN: PT FREQUENCY: 1x visit  PT DURATION:   PLANNED INTERVENTIONS (unless contraindicated): aquatic PT, Canalith repositioning, cryotherapy, Electrical stimulation, Iontophoresis with 4 mg/ml dexamethasome, Moist heat, traction, Ultrasound, gait training, Therapeutic exercise, balance training, neuromuscular re-education, patient/family education, prosthetic training, manual techniques, passive ROM, dry needling, taping, vasopnuematic device, vestibular, spinal manipulations, joint manipulations  PLAN FOR NEXT SESSION: plan was for 1x visit.  Hold for any necessary return. Discharge after 30 days.     Chyrel Masson, PT, DPT, OCS, ATC 06/04/23  3:33 PM

## 2023-06-05 ENCOUNTER — Ambulatory Visit: Payer: Medicare Other | Admitting: Rehabilitative and Restorative Service Providers"

## 2023-06-05 ENCOUNTER — Encounter: Payer: Self-pay | Admitting: Rehabilitative and Restorative Service Providers"

## 2023-06-05 DIAGNOSIS — M6281 Muscle weakness (generalized): Secondary | ICD-10-CM | POA: Diagnosis not present

## 2023-06-05 DIAGNOSIS — M79604 Pain in right leg: Secondary | ICD-10-CM

## 2023-06-07 ENCOUNTER — Other Ambulatory Visit: Payer: Self-pay | Admitting: Surgical

## 2023-06-07 ENCOUNTER — Telehealth: Payer: Self-pay | Admitting: *Deleted

## 2023-06-07 MED ORDER — HYDROCODONE-ACETAMINOPHEN 5-325 MG PO TABS: 1.0000 | ORAL_TABLET | Freq: Two times a day (BID) | ORAL | 0 refills | Status: DC | PRN

## 2023-06-07 NOTE — Telephone Encounter (Signed)
Got it sent to [facility]

## 2023-06-07 NOTE — Telephone Encounter (Signed)
Patient called requesting refill of pain medication. [facility] had it last time.

## 2023-06-07 NOTE — Telephone Encounter (Signed)
Sent it to [facility] [address].  Does he need [facility] to [facility] in [place] instead?

## 2023-06-21 ENCOUNTER — Other Ambulatory Visit: Payer: Self-pay | Admitting: Internal Medicine

## 2023-07-03 ENCOUNTER — Encounter: Payer: Self-pay | Admitting: Orthopedic Surgery

## 2023-07-03 ENCOUNTER — Other Ambulatory Visit (INDEPENDENT_AMBULATORY_CARE_PROVIDER_SITE_OTHER): Payer: Medicare Other

## 2023-07-03 ENCOUNTER — Ambulatory Visit (INDEPENDENT_AMBULATORY_CARE_PROVIDER_SITE_OTHER): Payer: Medicare Other | Admitting: Orthopedic Surgery

## 2023-07-03 DIAGNOSIS — M79671 Pain in right foot: Secondary | ICD-10-CM | POA: Diagnosis not present

## 2023-07-03 NOTE — Progress Notes (Unsigned)
Post-Op Visit Note   [name]           Date of Birth: 1950/12/08           MRN: [id number] Visit Date: 07/03/2023 PCP: Ardith Dark, MD   Assessment & Plan:  Chief Complaint:  Chief Complaint  Patient presents with   Right Knee - Routine Post Op    04/11/23 right TKA   Other     Right heel pain   Visit Diagnoses:  1. Pain of right heel     Plan: Patient is now 10 weeks out right total knee replacement.  Doing well with that.  Has some occasional pain in the back of his knee.  Also reports right heel pain for 2 days with no injury.  Pain stops around the midfoot.  On examination his range of motion is 0-1 15 with good stability to varus and valgus stress.  Does have a little tenderness around the Achilles attachment site as well as on the lateral aspect of his calf.  No calf tenderness in the back and negative Homans.  He is back on aspirin daily.  Patient is doing well from his knee replacement.  The right heel pain only been going on for a couple of days and is consistent with the radiographic findings of some calcification in the Achilles tendon attachment site.  Plan is 6-week return with decision for or against further imaging on the heel at that time.  He is doing well from his knee.  Follow-Up Instructions: No follow-ups on file.   Orders:  Orders Placed This Encounter  Procedures   XR Os Calcis Right   No orders of the defined types were placed in this encounter.   Imaging: No results found.  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   Arthritis of right knee 12/18/2022   Nonrheumatic mitral valve regurgitation s/p repair 2023    Peroneal tendinitis of right lower extremity 04/25/2021   Benign localized prostatic hyperplasia with lower urinary tract symptoms (LUTS) 09/27/2020   Wheezing 09/14/2020   Chondrocalcinosis 03/14/2020   Dyslipidemia 09/23/2017    Bilateral primary osteoarthritis of knee 09/02/2017   Neck pain 02/15/2017   Primary osteoarthritis of right hip 02/15/2017   Tear of right acetabular labrum 02/15/2017   Right leg pain 01/04/2017   Obese 08/17/2015   Elevated blood sugar 08/17/2015   Shingles 09/10/2014   Dyspnea 11/27/2011   HERNIA, UMBILICAL 08/02/2008   Essential hypertension 01/30/2008   Allergic rhinitis 01/30/2008   GERD 01/30/2008   HEADACHE 01/30/2008   Past Medical History:  Diagnosis Date   Allergy    Asthma    Coronary artery disease    GERD (gastroesophageal reflux disease)    Headache(784.0)    History of kidney stones    Hypertension    Osteoarthritis of knee    right    Family History  Problem Relation Age of Onset   Kidney disease Mother    COPD Father    Pneumonia Father    Cardiomyopathy Brother    Hypertension Sister    Heart disease Brother    Hypertension Brother    Hypertension Brother    Hypertension Sister    Diabetes Sister    Osteoarthritis Sister     Past Surgical History:  Procedure Laterality Date   CARDIAC CATHETERIZATION     HERNIA REPAIR  1969, 2009   kidney  stones  05/20/1995   KNEE SURGERY  02/26/1989   MITRAL VALVE REPAIR  01/02/2022   NASAL SINUS SURGERY  10/24/1992   RIGHT/LEFT HEART CATH AND CORONARY ANGIOGRAPHY N/A 11/15/2021   Procedure: RIGHT/LEFT HEART CATH AND CORONARY ANGIOGRAPHY;  Surgeon: Orbie Pyo, MD;  Location: [facility];  Service: Cardiovascular;  Laterality: N/A;   ROTATOR CUFF REPAIR Left 04/25/1993   TEE WITHOUT CARDIOVERSION N/A 08/24/2021   Procedure: TRANSESOPHAGEAL ECHOCARDIOGRAM (TEE);  Surgeon: Jodelle Red, MD;  Location: [facility] ENDOSCOPY;  Service: Cardiovascular;  Laterality: N/A;   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   Procedure: LEFT TOTAL KNEE ARTHROPLASTY;  Surgeon: Cammy Copa, MD;  Location: [facility] OR;  Service: Orthopedics;  Laterality: Left;   TOTAL KNEE ARTHROPLASTY Right 04/11/2023   Procedure:  RIGHT TOTAL KNEE ARTHROPLASTY-CEMENTED;  Surgeon: Cammy Copa, MD;  Location: [facility] OR;  Service: Orthopedics;  Laterality: Right;   Social History   Occupational History   Not on file  Tobacco Use   Smoking status: Former    Current packs/day: 0.00    Types: Cigarettes    Quit date: 1972    Years since quitting: 52.7   Smokeless tobacco: Never   Tobacco comments:    quit in 1972  Vaping Use   Vaping status: Never Used  Substance and Sexual Activity   Alcohol use: No   Drug use: No   Sexual activity: Not on file

## 2023-07-19 ENCOUNTER — Other Ambulatory Visit: Payer: Self-pay | Admitting: Surgical

## 2023-07-28 IMAGING — DX DG KNEE COMPLETE 4+V*R*
4 series · 4 of 4 positions shown · non-contrast
Comparison: None.

CLINICAL DATA: Knee pain.  No known injury.

EXAM:
RIGHT KNEE - COMPLETE 4+ VIEW

[knee ap]
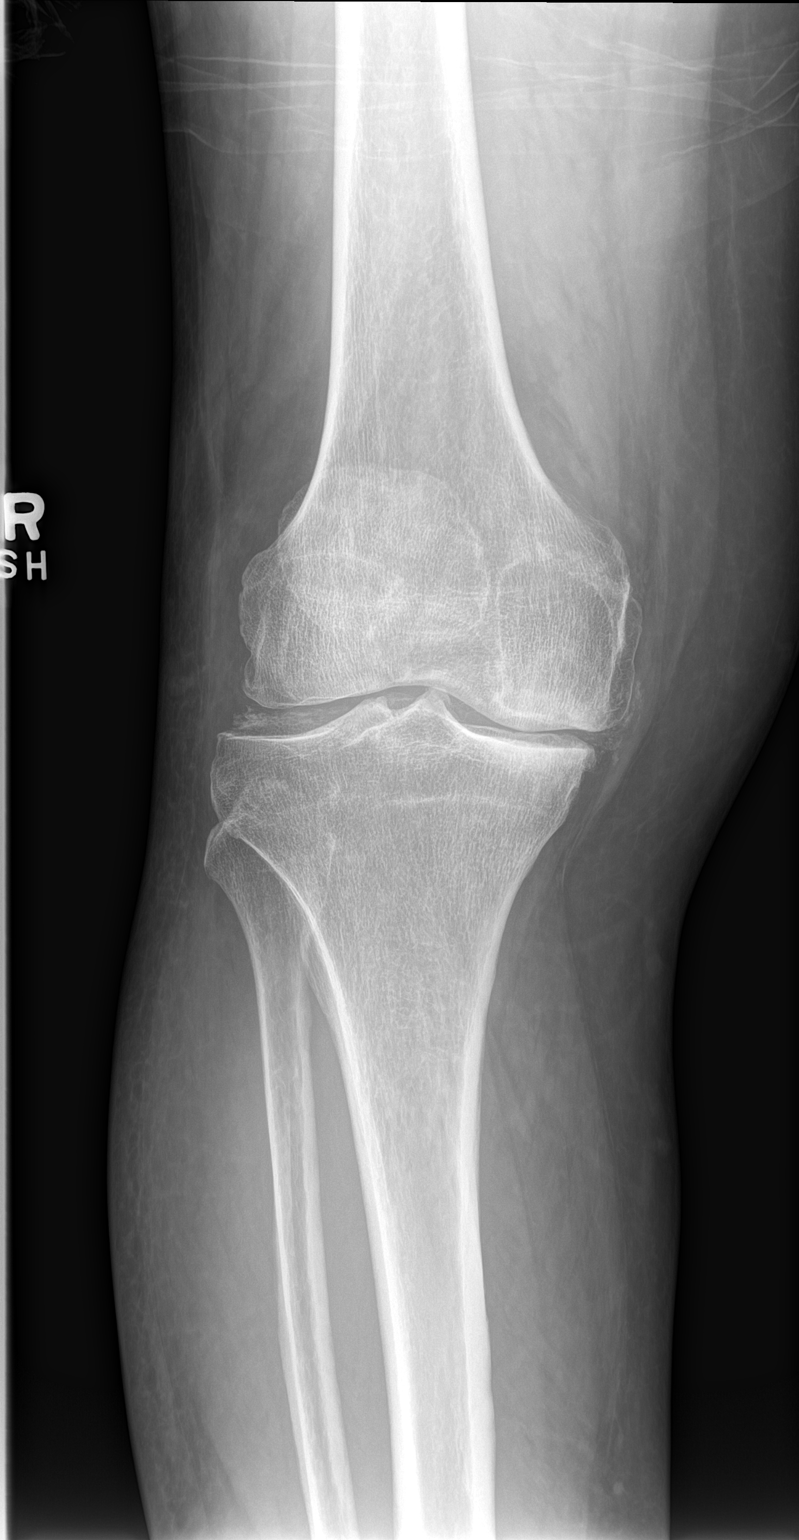

[knee obl (1 of 2)]
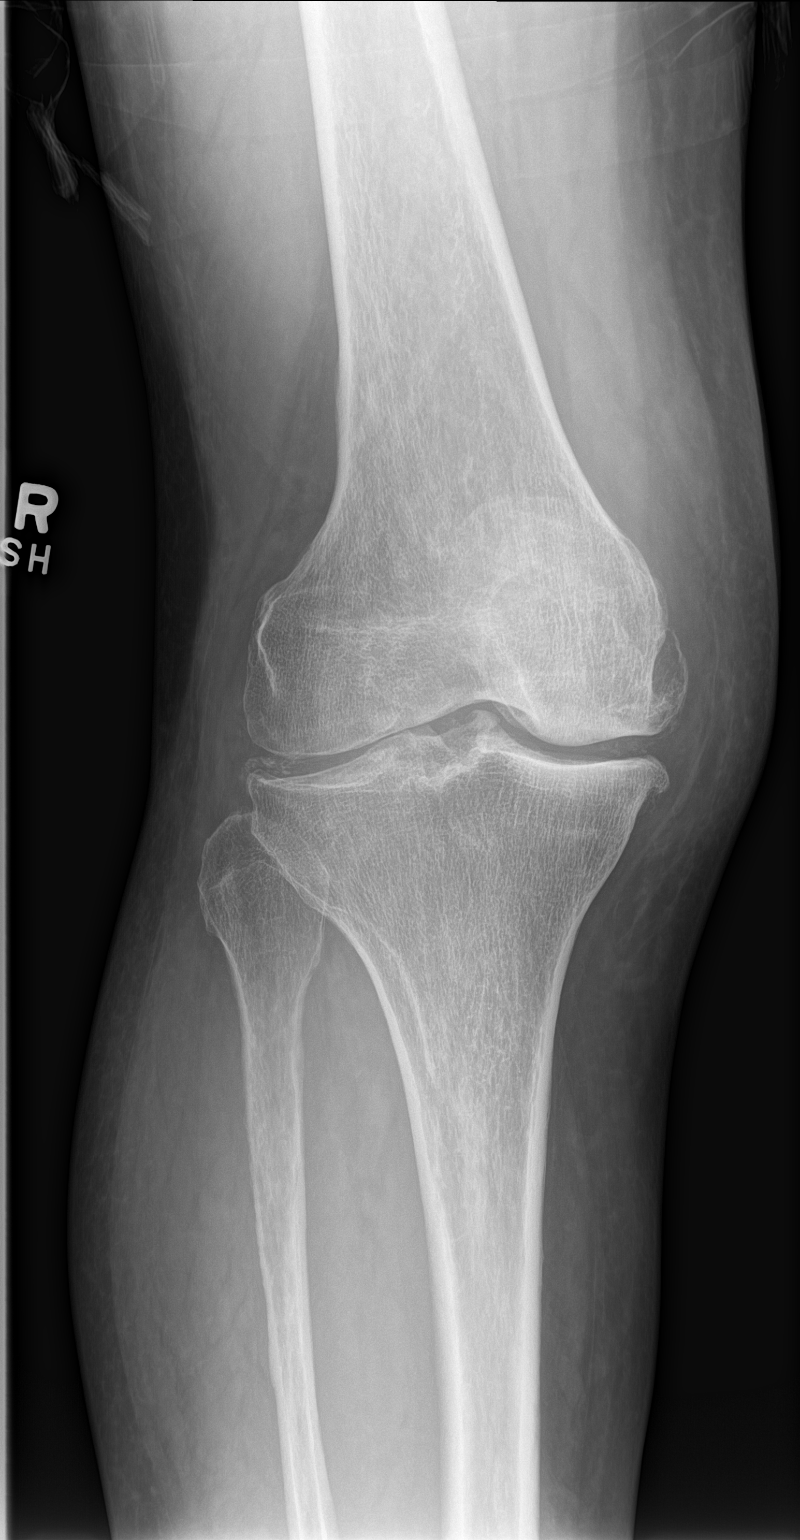

[knee obl (2 of 2)]
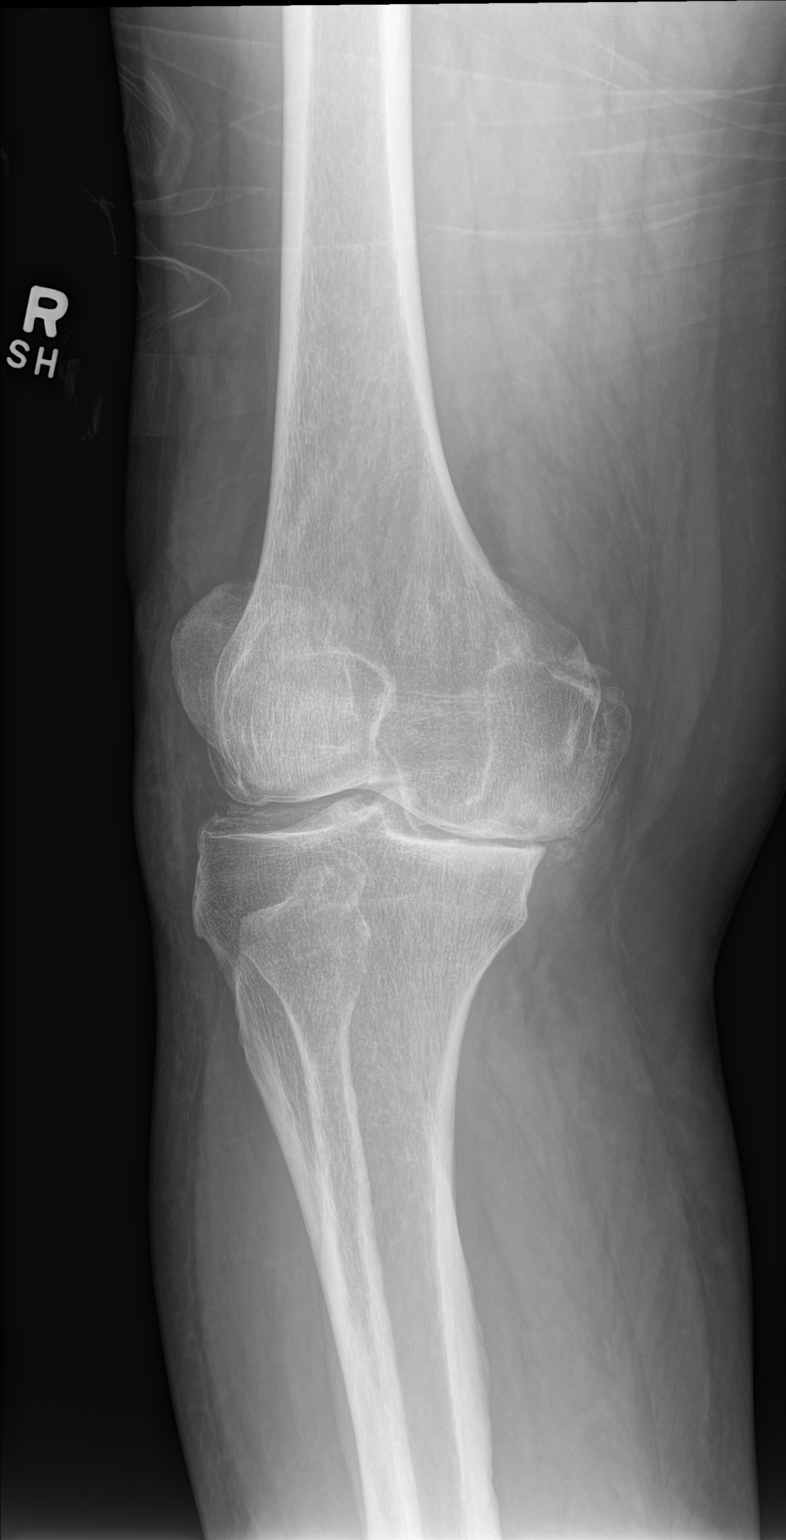

[knee lat]
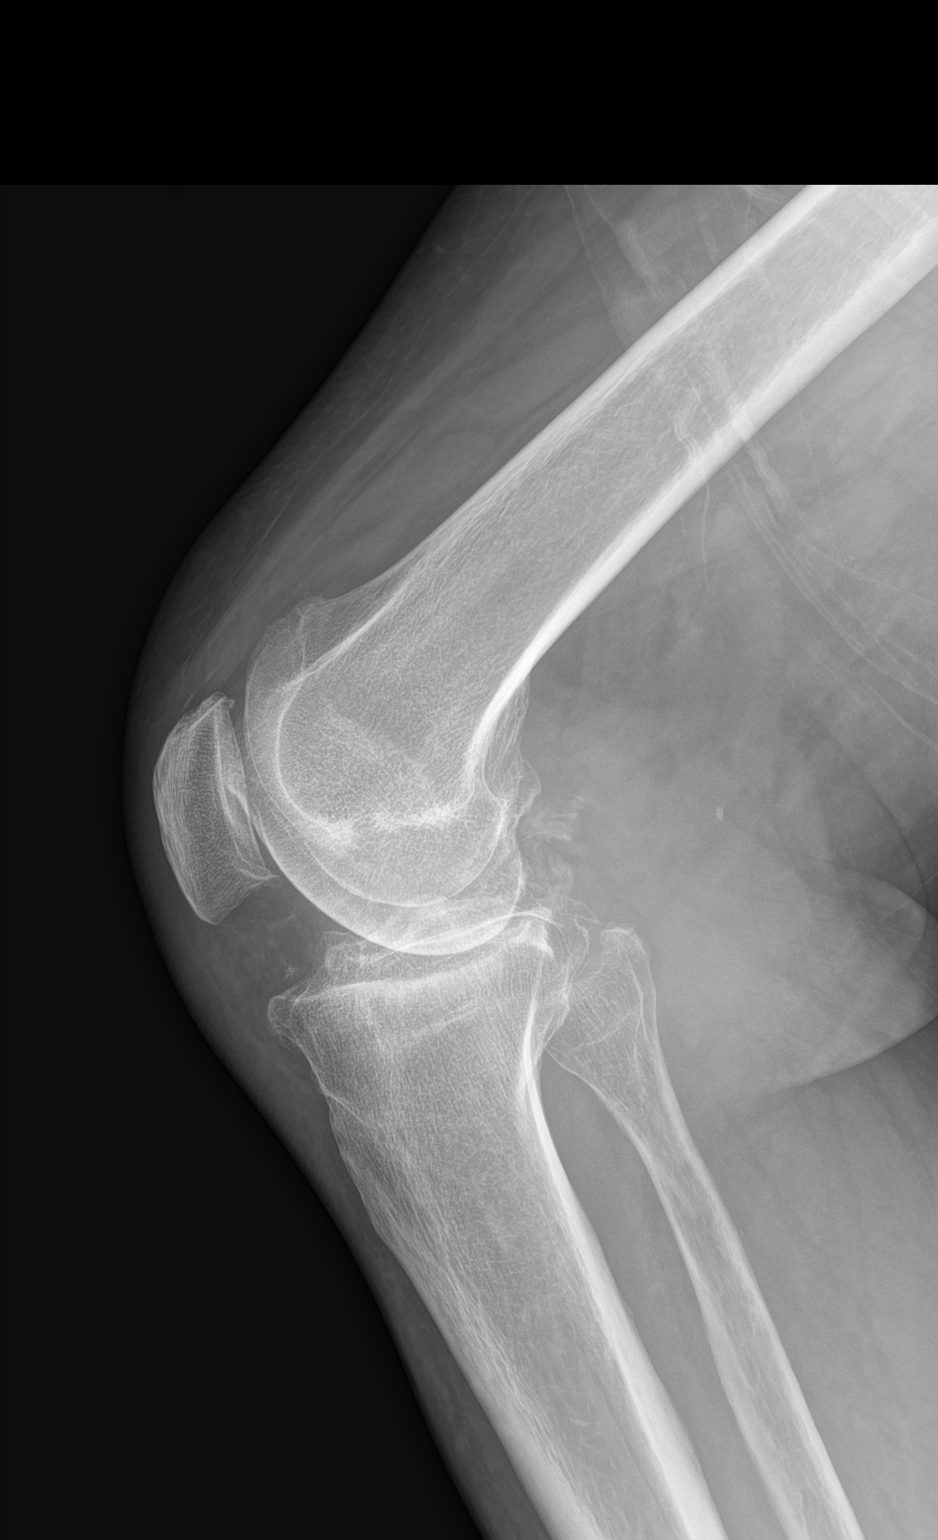

[4 of 4 positions shown; findings below may reference images not displayed]

FINDINGS: Degenerative narrowing of all 3 compartments of the RIGHT knee, mild
to moderate in degree, with associated osseous spurring. Faint
calcifications are seen throughout the joint, indicating underlying
chondrocalcinosis suggesting CPPD.

No fracture line or displaced fracture fragment. No acute-appearing
cortical irregularity or osseous lesion. No appreciable joint
effusion. Adjacent soft tissues are unremarkable.
IMPRESSION: 1. No acute findings.
2. Tricompartmental degenerative narrowing of the RIGHT knee, mild
to moderate in degree, with associated mild osseous spurring.
Chondrocalcinosis suggests CPPD.

## 2023-07-29 ENCOUNTER — Other Ambulatory Visit: Payer: Self-pay | Admitting: Internal Medicine

## 2023-07-29 ENCOUNTER — Other Ambulatory Visit: Payer: Self-pay | Admitting: Nurse Practitioner

## 2023-07-31 MED ORDER — POTASSIUM CHLORIDE ER 20 MEQ PO TBCR: 0.5000 | EXTENDED_RELEASE_TABLET | ORAL | 1 refills | Status: DC

## 2023-08-02 ENCOUNTER — Other Ambulatory Visit: Payer: Self-pay | Admitting: Family Medicine

## 2023-08-02 DIAGNOSIS — Z1211 Encounter for screening for malignant neoplasm of colon: Secondary | ICD-10-CM

## 2023-08-02 DIAGNOSIS — Z1212 Encounter for screening for malignant neoplasm of rectum: Secondary | ICD-10-CM

## 2023-08-08 ENCOUNTER — Encounter: Payer: Self-pay | Admitting: Family Medicine

## 2023-08-08 ENCOUNTER — Ambulatory Visit (INDEPENDENT_AMBULATORY_CARE_PROVIDER_SITE_OTHER): Payer: Medicare Other | Admitting: Family Medicine

## 2023-08-08 VITALS — BP 148/88 | HR 74 | Temp 97.7°F | Ht 62.0 in | Wt 182.8 lb

## 2023-08-08 DIAGNOSIS — I10 Essential (primary) hypertension: Secondary | ICD-10-CM

## 2023-08-08 DIAGNOSIS — Z23 Encounter for immunization: Secondary | ICD-10-CM | POA: Diagnosis not present

## 2023-08-08 DIAGNOSIS — M170 Bilateral primary osteoarthritis of knee: Secondary | ICD-10-CM | POA: Diagnosis not present

## 2023-08-08 DIAGNOSIS — Z131 Encounter for screening for diabetes mellitus: Secondary | ICD-10-CM

## 2023-08-08 DIAGNOSIS — Z0000 Encounter for general adult medical examination without abnormal findings: Secondary | ICD-10-CM | POA: Diagnosis not present

## 2023-08-08 DIAGNOSIS — E785 Hyperlipidemia, unspecified: Secondary | ICD-10-CM | POA: Diagnosis not present

## 2023-08-08 DIAGNOSIS — Z125 Encounter for screening for malignant neoplasm of prostate: Secondary | ICD-10-CM | POA: Diagnosis not present

## 2023-08-08 LAB — LIPID PANEL
Cholesterol: 182 mg/dL (ref 0–200)
HDL: 30.7 mg/dL — ABNORMAL LOW (ref >39.00)
LDL Cholesterol: 107 mg/dL — ABNORMAL HIGH (ref 0–99)
NonHDL: 151.06
Total CHOL/HDL Ratio: 6
Triglycerides: 218 mg/dL — ABNORMAL HIGH (ref 0.0–149.0)
VLDL: 43.6 mg/dL — ABNORMAL HIGH (ref 0.0–40.0)

## 2023-08-08 LAB — COMPREHENSIVE METABOLIC PANEL
ALT: 10 U/L (ref 0–53)
AST: 13 U/L (ref 0–37)
Albumin: 4.1 g/dL (ref 3.5–5.2)
Alkaline Phosphatase: 68 U/L (ref 39–117)
BUN: 19 mg/dL (ref 6–23)
CO2: 28 meq/L (ref 19–32)
Calcium: 9.3 mg/dL (ref 8.4–10.5)
Chloride: 106 meq/L (ref 96–112)
Creatinine, Ser: 0.81 mg/dL (ref 0.40–1.50)
GFR: 88.44 mL/min (ref >60.00)
Glucose, Bld: 94 mg/dL (ref 70–99)
Potassium: 3.9 meq/L (ref 3.5–5.1)
Sodium: 140 meq/L (ref 135–145)
Total Bilirubin: 0.4 mg/dL (ref 0.2–1.2)
Total Protein: 6.1 g/dL (ref 6.0–8.3)

## 2023-08-08 LAB — CBC
HCT: 43.1 % (ref 39.0–52.0)
Hemoglobin: 14.2 g/dL (ref 13.0–17.0)
MCHC: 32.9 g/dL (ref 30.0–36.0)
MCV: 89.6 fL (ref 78.0–100.0)
Platelets: 233 10*3/uL (ref 150.0–400.0)
RBC: 4.81 Mil/uL (ref 4.22–5.81)
RDW: 14.5 % (ref 11.5–15.5)
WBC: 6.5 10*3/uL (ref 4.0–10.5)

## 2023-08-08 LAB — PSA: PSA: 1.37 ng/mL (ref 0.10–4.00)

## 2023-08-08 LAB — HEMOGLOBIN A1C: Hgb A1c MFr Bld: 6.4 % (ref 4.6–6.5)

## 2023-08-08 LAB — TSH: TSH: 2.11 u[IU]/mL (ref 0.35–5.50)

## 2023-08-08 NOTE — Progress Notes (Signed)
Chief Complaint:  Frank [name] is a 72 y.o. male who presents today for his annual comprehensive physical exam.    Assessment/Plan:  Chronic Problems Addressed Today: Dyslipidemia Check lipids.  Discussed lifestyle modifications.  Bilateral primary osteoarthritis of knee Doing well status post replacement.  Continue management per orthopedics.  Essential hypertension Mildly evaded today.  His cardiologist stopped his lisinopril HCTZ.  His blood pressure is well-controlled at home.  He will continue to monitor at home and let us know if persistently elevated.  He has follow-up with cardiology in a couple of months as well.  Preventative Healthcare: Will complete Cologuard soon. Check labs. Declined flu vaccine.   Patient Counseling(The following topics were reviewed and/or handout was given):  -Nutrition: Stressed importance of moderation in sodium/caffeine intake, saturated fat and cholesterol, caloric balance, sufficient intake of fresh fruits, vegetables, and fiber.  -Stressed the importance of regular exercise.   -Substance Abuse: Discussed cessation/primary prevention of tobacco, alcohol, or other drug use; driving or other dangerous activities under the influence; availability of treatment for abuse.   -Injury prevention: Discussed safety belts, safety helmets, smoke detector, smoking near bedding or upholstery.   -Sexuality: Discussed sexually transmitted diseases, partner selection, use of condoms, avoidance of unintended pregnancy and contraceptive alternatives.   -Dental health: Discussed importance of regular tooth brushing, flossing, and dental visits.  -Health maintenance and immunizations reviewed. Please refer to Health maintenance section.  Return to care in 1 year for next preventative visit.     Subjective:  HPI:  He has no acute complaints today. He recently had both knees replaced. Doing well post operatively.   Lifestyle Diet: Balanced. Plenty of fruits  and vegetables.  Exercise: Limited due to recent knee surgeries but trying to walk more.      08/08/2023    7:37 AM  Depression screen PHQ 2/9  Decreased Interest 0  Down, Depressed, Hopeless 0  PHQ - 2 Score 0    Health Maintenance Due  Topic Date Due   Fecal DNA (Cologuard)  07/01/2020     ROS: Per HPI, otherwise a complete review of systems was negative.   PMH:  The following were reviewed and entered/updated in epic: Past Medical History:  Diagnosis Date   Allergy    Asthma    Coronary artery disease    GERD (gastroesophageal reflux disease)    Headache(784.0)    History of kidney stones    Hypertension    Osteoarthritis of knee    right   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   Arthritis of right knee 12/18/2022   Nonrheumatic mitral valve regurgitation s/p repair 2023    Peroneal tendinitis of right lower extremity 04/25/2021   Benign localized prostatic hyperplasia with lower urinary tract symptoms (LUTS) 09/27/2020   Wheezing 09/14/2020   Chondrocalcinosis 03/14/2020   Dyslipidemia 09/23/2017   Bilateral primary osteoarthritis of knee 09/02/2017   Neck pain 02/15/2017   Primary osteoarthritis of right hip 02/15/2017   Tear of right acetabular labrum 02/15/2017   Right leg pain 01/04/2017   Obese 08/17/2015   Elevated blood sugar 08/17/2015   Shingles 09/10/2014   Dyspnea 11/27/2011   HERNIA, UMBILICAL 08/02/2008   Essential hypertension 01/30/2008   Allergic rhinitis 01/30/2008   GERD 01/30/2008   HEADACHE 01/30/2008   Past Surgical History:  Procedure Laterality Date   CARDIAC CATHETERIZATION     HERNIA REPAIR  1969, 2009   kidney  stones  05/20/1995   KNEE SURGERY  02/26/1989   MITRAL VALVE REPAIR  01/02/2022   NASAL SINUS SURGERY  10/24/1992   RIGHT/LEFT HEART CATH AND CORONARY ANGIOGRAPHY N/A 11/15/2021   Procedure: RIGHT/LEFT HEART CATH AND CORONARY ANGIOGRAPHY;  Surgeon:  Orbie Pyo, MD;  Location: [facility];  Service: Cardiovascular;  Laterality: N/A;   ROTATOR CUFF REPAIR Left 04/25/1993   TEE WITHOUT CARDIOVERSION N/A 08/24/2021   Procedure: TRANSESOPHAGEAL ECHOCARDIOGRAM (TEE);  Surgeon: Jodelle Red, MD;  Location: [facility] ENDOSCOPY;  Service: Cardiovascular;  Laterality: N/A;   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   Procedure: LEFT TOTAL KNEE ARTHROPLASTY;  Surgeon: Cammy Copa, MD;  Location: [facility] OR;  Service: Orthopedics;  Laterality: Left;   TOTAL KNEE ARTHROPLASTY Right 04/11/2023   Procedure: RIGHT TOTAL KNEE ARTHROPLASTY-CEMENTED;  Surgeon: Cammy Copa, MD;  Location: [facility] OR;  Service: Orthopedics;  Laterality: Right;    Family History  Problem Relation Age of Onset   Kidney disease Mother    COPD Father    Pneumonia Father    Cardiomyopathy Brother    Hypertension Sister    Heart disease Brother    Hypertension Brother    Hypertension Brother    Hypertension Sister    Diabetes Sister    Osteoarthritis Sister     Medications- reviewed and updated Current Outpatient Medications  Medication Sig Dispense Refill   acetaminophen (TYLENOL) 325 MG tablet Take 1-2 tablets (325-650 mg total) by mouth every 6 (six) hours as needed for mild pain (pain score 1-3 or temp > 100.5). 60 tablet 0   ascorbic acid (VITAMIN C) 500 MG tablet Take 500 mg by mouth daily.     aspirin 81 MG chewable tablet Chew 1 tablet (81 mg total) by mouth 2 (two) times daily. 60 tablet 0   cholecalciferol (VITAMIN D) 25 MCG (1000 UNIT) tablet Take 1,000 Units by mouth daily.     fluticasone (FLONASE) 50 MCG/ACT nasal spray Place 1 spray into both nostrils daily as needed for allergies or rhinitis.     furosemide (LASIX) 20 MG tablet TAKE 1 TABLET (20 MG TOTAL) BY MOUTH AS DIRECTED. TAKE 1 TABLET TWICE A DAY MON/WED/FRI ONLY 90 tablet 1   Potassium Chloride ER 20 MEQ TBCR Take 0.5 tablets (10 mEq total) by mouth 3 (three) times a week.  Mon/Wed/Fri 45 tablet 1   tamsulosin (FLOMAX) 0.4 MG CAPS capsule TAKE 1 CAPSULE BY MOUTH EVERY DAY 90 capsule 1   triamcinolone (KENALOG) 0.1 % paste Apply to affected area 1-2 times daily (Patient taking differently: 1 application  daily as needed (mouth sores).) 5 g 12   vitamin B-12 (CYANOCOBALAMIN) 1000 MCG tablet Take 1,000 mcg by mouth daily.     Zinc 50 MG CAPS Take 50 mg by mouth daily.     No current facility-administered medications for this visit.    Allergies-reviewed and updated Allergies  Allergen Reactions   Meloxicam Hives and Shortness Of Breath   Atorvastatin Other (See Comments)    Joint pain   Metoprolol Rash    Weakness / fatigue    Tomato     Mouth breaks out    Social History   Socioeconomic History   Marital status: Married    Spouse name: Not on file   Number of children: 2   Years of education: 11   Highest education level: 11th grade  Occupational History   Not on file  Tobacco Use   Smoking status: Former  Current packs/day: 0.00    Types: Cigarettes    Quit date: 23    Years since quitting: 52.8   Smokeless tobacco: Never   Tobacco comments:    quit in 1972  Vaping Use   Vaping status: Never Used  Substance and Sexual Activity   Alcohol use: No   Drug use: No   Sexual activity: Not on file  Other Topics Concern   Not on file  Social History Narrative   Not on file   Social Determinants of Health   Financial Resource Strain: Low Risk  (11/15/2022)   Overall Financial Resource Strain (CARDIA)    Difficulty of Paying Living Expenses: Not hard at all  Food Insecurity: No Food Insecurity (04/19/2023)   Hunger Vital Sign    Worried About Running Out of Food in the Last Year: Never true    Ran Out of Food in the Last Year: Never true  Transportation Needs: No Transportation Needs (04/19/2023)   PRAPARE - Administrator, Civil Service (Medical): No    Lack of Transportation (Non-Medical): No  Physical Activity:  Insufficiently Active (11/15/2022)   Exercise Vital Sign    Days of Exercise per Week: 2 days    Minutes of Exercise per Session: 20 min  Stress: No Stress Concern Present (11/15/2022)   [employer] of Occupational Health - Occupational Stress Questionnaire    Feeling of Stress : Not at all  Social Connections: Moderately Isolated (11/15/2022)   Social Connection and Isolation Panel [NHANES]    Frequency of Communication with Friends and Family: More than three times a week    Frequency of Social Gatherings with Friends and Family: More than three times a week    Attends Religious Services: Never    Database administrator or Organizations: No    Attends Engineer, structural: Never    Marital Status: Married        Objective:  Physical Exam: BP (!) 148/88   Pulse 74   Temp 97.7 F (36.5 C) (Temporal)   Ht 5\' 2"  (1.575 m)   Wt 182 lb 12.8 oz (82.9 kg)   SpO2 98%   BMI 33.43 kg/m   Body mass index is 33.43 kg/m. Wt Readings from Last 3 Encounters:  08/08/23 182 lb 12.8 oz (82.9 kg)  04/11/23 175 lb (79.4 kg)  04/04/23 182 lb 1.6 oz (82.6 kg)   Gen: NAD, resting comfortably HEENT: TMs normal bilaterally. OP clear. No thyromegaly noted.  CV: RRR with no murmurs appreciated Pulm: NWOB, CTAB with no crackles, wheezes, or rhonchi GI: Normal bowel sounds present. Soft, Nontender, Nondistended. MSK: no edema, cyanosis, or clubbing noted Skin: warm, dry Neuro: CN2-12 grossly intact. Strength 5/5 in upper and lower extremities. Reflexes symmetric and intact bilaterally.  Psych: Normal affect and thought content     Monia Timmers M. Jimmey Ralph, MD 08/08/2023 8:41 AM

## 2023-08-08 NOTE — Assessment & Plan Note (Addendum)
Mildly evaded today.  His cardiologist stopped his lisinopril HCTZ.  His blood pressure is well-controlled at home.  He will continue to monitor at home and let us know if persistently elevated.  He has follow-up with cardiology in a couple of months as well.

## 2023-08-08 NOTE — Assessment & Plan Note (Signed)
Doing well status post replacement.  Continue management per orthopedics.

## 2023-08-08 NOTE — Assessment & Plan Note (Signed)
Check lipids. Discussed lifestyle modifications.  

## 2023-08-08 NOTE — Patient Instructions (Addendum)
It was very nice to see you today!  I am glad you are doing well.   We will check blood work today.  Keep an eye on your blood pressure and let know if it is running high at home.   We give you your pneumonia vaccine today.  Return in about 1 year (around 08/07/2024) for Annual Physical.   Take care, Dr Jimmey Ralph  PLEASE NOTE:  If you had any lab tests, please let us know if you have not heard back within a few days. You may see your results on mychart before we have a chance to review them but we will give you a call once they are reviewed by [place].   If we ordered any referrals today, please let us know if you have not heard from their office within the next week.   If you had any urgent prescriptions sent in today, please check with the pharmacy within an hour of our visit to make sure the prescription was transmitted appropriately.   Please try these tips to maintain a healthy lifestyle:  Eat at least 3 REAL meals and 1-2 snacks per day.  Aim for no more than 5 hours between eating.  If you eat breakfast, please do so within one hour of getting up.   Each meal should contain half fruits/vegetables, one quarter protein, and one quarter carbs (no bigger than a computer mouse)  Cut down on sweet beverages. This includes juice, soda, and sweet tea.   Drink at least 1 glass of water with each meal and aim for at least 8 glasses per day  Exercise at least 150 minutes every week.    Preventive Care 64 Years and Older, Male Preventive care refers to lifestyle choices and visits with your health care provider that can promote health and wellness. Preventive care visits are also called wellness exams. What can I expect for my preventive care visit? Counseling During your preventive care visit, your health care provider may ask about your: Medical history, including: Past medical problems. Family medical history. History of falls. Current health, including: Emotional well-being. Home  life and relationship well-being. Sexual activity. Memory and ability to understand (cognition). Lifestyle, including: Alcohol, nicotine or tobacco, and drug use. Access to firearms. Diet, exercise, and sleep habits. Work and work Astronomer. Sunscreen use. Safety issues such as seatbelt and bike helmet use. Physical exam Your health care provider will check your: Height and weight. These may be used to calculate your BMI (body mass index). BMI is a measurement that tells if you are at a healthy weight. Waist circumference. This measures the distance around your waistline. This measurement also tells if you are at a healthy weight and may help predict your risk of certain diseases, such as type 2 diabetes and high blood pressure. Heart rate and blood pressure. Body temperature. Skin for abnormal spots. What immunizations do I need?  Vaccines are usually given at various ages, according to a schedule. Your health care provider will recommend vaccines for you based on your age, medical history, and lifestyle or other factors, such as travel or where you work. What tests do I need? Screening Your health care provider may recommend screening tests for certain conditions. This may include: Lipid and cholesterol levels. Diabetes screening. This is done by checking your blood sugar (glucose) after you have not eaten for a while (fasting). Hepatitis C test. Hepatitis B test. HIV (human immunodeficiency virus) test. STI (sexually transmitted infection) testing, if you are at  risk. Lung cancer screening. Colorectal cancer screening. Prostate cancer screening. Abdominal aortic aneurysm (AAA) screening. You may need this if you are a current or former smoker. Talk with your health care provider about your test results, treatment options, and if necessary, the need for more tests. Follow these instructions at home: Eating and drinking  Eat a diet that includes fresh fruits and vegetables,  whole grains, lean protein, and low-fat dairy products. Limit your intake of foods with high amounts of sugar, saturated fats, and salt. Take vitamin and mineral supplements as recommended by your health care provider. Do not drink alcohol if your health care provider tells you not to drink. If you drink alcohol: Limit how much you have to 0-2 drinks a day. Know how much alcohol is in your drink. In the [place] one drink equals one 12 oz bottle of beer (355 mL), one 5 oz glass of wine (148 mL), or one 1 oz glass of hard liquor (44 mL). Lifestyle Brush your teeth every morning and night with fluoride toothpaste. Floss one time each day. Exercise for at least 30 minutes 5 or more days each week. Do not use any products that contain nicotine or tobacco. These products include cigarettes, chewing tobacco, and vaping devices, such as e-cigarettes. If you need help quitting, ask your health care provider. Do not use drugs. If you are sexually active, practice safe sex. Use a condom or other form of protection to prevent STIs. Take aspirin only as told by your health care provider. Make sure that you understand how much to take and what form to take. Work with your health care provider to find out whether it is safe and beneficial for you to take aspirin daily. Ask your health care provider if you need to take a cholesterol-lowering medicine (statin). Find healthy ways to manage stress, such as: Meditation, yoga, or listening to music. Journaling. Talking to a trusted person. Spending time with friends and family. Safety Always wear your seat belt while driving or riding in a vehicle. Do not drive: If you have been drinking alcohol. Do not ride with someone who has been drinking. When you are tired or distracted. While texting. If you have been using any mind-altering substances or drugs. Wear a helmet and other protective equipment during sports activities. If you have firearms in your house,  make sure you follow all gun safety procedures. Minimize exposure to UV radiation to reduce your risk of skin cancer. What's next? Visit your health care provider once a year for an annual wellness visit. Ask your health care provider how often you should have your eyes and teeth checked. Stay up to date on all vaccines. This information is not intended to replace advice given to you by your health care provider. Make sure you discuss any questions you have with your health care provider. Document Revised: 04/12/2021 Document Reviewed: 04/12/2021 Elsevier Patient Education  2024 [employer].

## 2023-08-12 NOTE — Progress Notes (Signed)
Cholesterol is a little bit elevated.  He would benefit from starting cholesterol medication to improve numbers and lower risk of heart attack and stroke.  Please send in Lipitor 40 mg daily if he is agreeable to start.  Regardless, he should continue to work on diet and exercise and we can recheck in a year or so.  His blood sugar is up a bit since last time we checked 2.  Do not need to start meds but he should continue to work on diet and exercise and we can recheck this in 6 to 12 months.  The rest of his labs are all stable and we can recheck everything else in a year or so.

## 2023-08-14 ENCOUNTER — Ambulatory Visit: Payer: Medicare Other | Admitting: Orthopedic Surgery

## 2023-08-14 ENCOUNTER — Encounter: Payer: Self-pay | Admitting: Orthopedic Surgery

## 2023-08-14 DIAGNOSIS — Z96651 Presence of right artificial knee joint: Secondary | ICD-10-CM | POA: Diagnosis not present

## 2023-08-14 NOTE — Progress Notes (Signed)
Post-Op Visit Note   [name]           Date of Birth: 1951/02/06           MRN: [id number] Visit Date: 08/14/2023 PCP: Ardith Dark, MD   Assessment & Plan:  Chief Complaint:  Chief Complaint  Patient presents with   Right Knee - Follow-up   Visit Diagnoses:  1. S/P total knee replacement, right     Plan: Patient is now about 4 months out right total knee replacement.  Done 04/11/2023.  Doing well with some occasional lateral pain.  On exam is got range of motion 5-1 15 with some mild edema but no calf tenderness in that right leg.  Not using a cane or assistive devices.  Tylenol for pain.  No therapy he is completed that.  Overall he looks good from his knee replacements.  Although debatable I recommended antibiotic prophylaxis for dental procedures for the first year after his knee replacement and then not indicated thereafter.  He will follow-up with [place] as needed.  Follow-Up Instructions: No follow-ups on file.   Orders:  No orders of the defined types were placed in this encounter.  No orders of the defined types were placed in this encounter.   Imaging: No results found.  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   Arthritis of right knee 12/18/2022   Nonrheumatic mitral valve regurgitation s/p repair 2023    Peroneal tendinitis of right lower extremity 04/25/2021   Benign localized prostatic hyperplasia with lower urinary tract symptoms (LUTS) 09/27/2020   Wheezing 09/14/2020   Chondrocalcinosis 03/14/2020   Dyslipidemia 09/23/2017   Bilateral primary osteoarthritis of knee 09/02/2017   Neck pain 02/15/2017   Primary osteoarthritis of right hip 02/15/2017   Tear of right acetabular labrum 02/15/2017   Right leg pain 01/04/2017   Obese 08/17/2015   Elevated blood sugar 08/17/2015   Shingles 09/10/2014   Dyspnea 11/27/2011   HERNIA, UMBILICAL 08/02/2008   Essential  hypertension 01/30/2008   Allergic rhinitis 01/30/2008   GERD 01/30/2008   HEADACHE 01/30/2008   Past Medical History:  Diagnosis Date   Allergy    Asthma    Coronary artery disease    GERD (gastroesophageal reflux disease)    Headache(784.0)    History of kidney stones    Hypertension    Osteoarthritis of knee    right    Family History  Problem Relation Age of Onset   Kidney disease Mother    COPD Father    Pneumonia Father    Cardiomyopathy Brother    Hypertension Sister    Heart disease Brother    Hypertension Brother    Hypertension Brother    Hypertension Sister    Diabetes Sister    Osteoarthritis Sister     Past Surgical History:  Procedure Laterality Date   CARDIAC CATHETERIZATION     HERNIA REPAIR  1969, 2009   kidney stones  05/20/1995   KNEE SURGERY  02/26/1989   MITRAL VALVE REPAIR  01/02/2022   NASAL SINUS SURGERY  10/24/1992   RIGHT/LEFT HEART CATH AND CORONARY ANGIOGRAPHY N/A 11/15/2021   Procedure: RIGHT/LEFT HEART CATH AND CORONARY ANGIOGRAPHY;  Surgeon: Orbie Pyo, MD;  Location: [facility] INVASIVE CV LAB;  Service: Cardiovascular;  Laterality: N/A;   ROTATOR CUFF REPAIR Left 04/25/1993   TEE WITHOUT CARDIOVERSION N/A 08/24/2021   Procedure: TRANSESOPHAGEAL ECHOCARDIOGRAM (TEE);  Surgeon: Jodelle Red, MD;  Location: [facility] ENDOSCOPY;  Service: Cardiovascular;  Laterality: N/A;   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   Procedure: LEFT TOTAL KNEE ARTHROPLASTY;  Surgeon: Cammy Copa, MD;  Location: [facility] OR;  Service: Orthopedics;  Laterality: Left;   TOTAL KNEE ARTHROPLASTY Right 04/11/2023   Procedure: RIGHT TOTAL KNEE ARTHROPLASTY-CEMENTED;  Surgeon: Cammy Copa, MD;  Location: [facility] OR;  Service: Orthopedics;  Laterality: Right;   Social History   Occupational History   Not on file  Tobacco Use   Smoking status: Former    Current packs/day: 0.00    Types: Cigarettes    Quit date: 1972    Years since quitting: 52.8    Smokeless tobacco: Never   Tobacco comments:    quit in 1972  Vaping Use   Vaping status: Never Used  Substance and Sexual Activity   Alcohol use: No   Drug use: No   Sexual activity: Not on file

## 2023-08-23 ENCOUNTER — Telehealth: Payer: Self-pay | Admitting: Family Medicine

## 2023-08-23 NOTE — Telephone Encounter (Signed)
Patient returned call. Requests to be called. 

## 2023-08-27 NOTE — Telephone Encounter (Signed)
See results note. 

## 2023-09-02 DIAGNOSIS — Z1212 Encounter for screening for malignant neoplasm of rectum: Secondary | ICD-10-CM | POA: Diagnosis not present

## 2023-09-02 DIAGNOSIS — Z1211 Encounter for screening for malignant neoplasm of colon: Secondary | ICD-10-CM | POA: Diagnosis not present

## 2023-09-07 LAB — COLOGUARD: COLOGUARD: NEGATIVE

## 2023-09-09 DIAGNOSIS — H16401 Unspecified corneal neovascularization, right eye: Secondary | ICD-10-CM | POA: Diagnosis not present

## 2023-09-09 DIAGNOSIS — H5213 Myopia, bilateral: Secondary | ICD-10-CM | POA: Diagnosis not present

## 2023-09-09 DIAGNOSIS — H52223 Regular astigmatism, bilateral: Secondary | ICD-10-CM | POA: Diagnosis not present

## 2023-09-09 DIAGNOSIS — H524 Presbyopia: Secondary | ICD-10-CM | POA: Diagnosis not present

## 2023-09-10 NOTE — Progress Notes (Signed)
Good news! Cologuard is negative. We can repeat in 3 years.  Katina Degree. Jimmey Ralph, MD 09/10/2023 9:50 AM

## 2023-10-02 NOTE — Progress Notes (Signed)
Cardiology Office Note:   Date:  10/04/2023  ID:  Frank [name], DOB Jan 06, 1951, MRN [id number] PCP:  Ardith Dark, MD  [facility] Providers Cardiologist:  Alverda Skeans, MD Referring MD: Ardith Dark, MD  Chief Complaint/Reason for Referral: Cardiology follow-up ASSESSMENT:    1. Nonrheumatic mitral valve regurgitation s/p repair 2023   2. Coronary artery disease involving native coronary artery of native heart without angina pectoris   3. Hyperlipidemia LDL goal <70   4. Essential hypertension   5. LVH (left ventricular hypertrophy)   6. CKD (chronic kidney disease) stage 2, GFR 60-89 ml/min   7. BMI 34.0-34.9,adult   8. Prediabetes     PLAN:   In order of problems listed above: Mitral regurgitation status post repair: Most recent echocardiogram demonstrates mild mitral regurgitation and moderate iatrogenic mitral stenosis due to Alfieri stitch.  Monitor for now. Coronary artery disease: Continue aspirin, refer to pharmacy for statin alternative. Hyperlipidemia: Intolerant of both Crestor and atorvastatin; refer to pharmacy for further recommendations regarding statin alternatives.  Will check LP(a) next week. Hypertension: Will start losartan 12.5 mg daily given evidence of left ventricular hypertrophy and chronic kidney disease.  BMP next week.  Could consider Jardiance in the future. Left ventricular hypertrophy: See #4 above. CKD stage II: Will start losartan for renal protection. Elevated BMI: Diet and exercise for now; if patient develops a diagnosis of diabetes and GLP-1 receptor agonist therapy should be considered. Prediabetes: Followed by PCP.  Start losartan as above.            Dispo:  Return in about 9 months (around 07/04/2024).      Medication Adjustments/Labs and Tests Ordered: Current medicines are reviewed at length with the patient today.  Concerns regarding medicines are outlined above.  The following changes have been made:     Labs/tests  ordered: Orders Placed This Encounter  Procedures   Basic metabolic panel   Lipoprotein A (LPA)   AMB Referral to [facility] Pharm-D   EKG 12-Lead    Medication Changes: Meds ordered this encounter  Medications   losartan (COZAAR) 25 MG tablet    Sig: Take 0.5 tablets (12.5 mg total) by mouth daily.    Dispense:  45 tablet    Refill:  3    Current medicines are reviewed at length with the patient today.  The patient does not have concerns regarding medicines.  I spent 33 minutes reviewing all clinical data during and prior to this visit including all relevant imaging studies, laboratories, clinical information from other health systems and prior notes from both Cardiology and other specialties, interviewing the patient, conducting a complete physical examination, and coordinating care in order to formulate a comprehensive and personalized evaluation and treatment plan.  History of Present Illness:      FOCUSED PROBLEM LIST:   Severe mitral regurgitation Mitral valve repair 36mm Simulus ring, Gore-Tex cord P2, Alfieri stitch 2023 [employer] Coronary artery disease Nonobstructive disease on preoperative coronary angiography 2023 Hypertension Mild LVH TTE 2023 Prior tobacco abuse Asthma Hyperlipidemia Intolerance of atorvastatin and rosuvastin BMI 34 CKD stage II Prediabetes Hemoglobin A1c 6.4 1 AVB plus IRBBB   April 2023:  The patient is a 72 y.o. male with the indicated medical history here for for routine cardiology follow-up.  He was referred to an external institution for consideration for mitral valve repair.  This was conducted in March and his postoperative course was complicated by supraventricular tachycardia that was short-lived.  He was  discharged on a beta-blocker.   The patient is doing well.  He has some minor aches and pains around his incision sites.  His incisions are well healed.  He denies any significant shortness of breath, palpitations, paroxysmal  nocturnal dyspnea, orthopnea.  He has some mild peripheral edema which seems to get better with recumbency.  His weight has increased by about 10 pounds since discharge.  He has required no hospitalizations or emergency room visits.  He has not yet been participating in cardiac rehabilitation.  He is otherwise well and without significant complaints.  Plan: Obtain echocardiogram, refer to cardiac rehabilitation, resume Lasix.  December 2024: In the interim a TTE demonstrated mild mitral regurgitation with a mean gradient of 6 mmHg and a preserved ejection fraction.  He was seen in August 2023 and he was noted to have increased lower extremity edema and his Lasix was increased to 60 mg for 3 days then reduced back to 40 mg twice a day.  His blood pressure was well-controlled.  He was seen again in December 2023 and his Lasix was changed to 3 times a week dosing.  His blood pressure is well-controlled.  He underwent uncomplicated left TKA in February of this year.  An LDL drawn in October of this year was 107.  He stopped his Crestor due to problems with arthralgias and myalgias.  The patient is doing very well.  He denies any shortness of breath or chest pain.  He has been experimenting with taking his Lasix maybe once a day.  He has had no issues with this.  He denies any peripheral edema or paroxysmal nocturnal dyspnea.  He has not required any emergency room visits or hospitalizations.  He is very happy with how he feels.            Current Medications: Current Meds  Medication Sig   acetaminophen (TYLENOL) 325 MG tablet Take 1-2 tablets (325-650 mg total) by mouth every 6 (six) hours as needed for mild pain (pain score 1-3 or temp > 100.5).   ascorbic acid (VITAMIN C) 500 MG tablet Take 500 mg by mouth daily.   aspirin 81 MG chewable tablet Chew 1 tablet (81 mg total) by mouth 2 (two) times daily.   cholecalciferol (VITAMIN D) 25 MCG (1000 UNIT) tablet Take 1,000 Units by mouth daily.    fluticasone (FLONASE) 50 MCG/ACT nasal spray Place 1 spray into both nostrils daily as needed for allergies or rhinitis.   furosemide (LASIX) 20 MG tablet TAKE 1 TABLET (20 MG TOTAL) BY MOUTH AS DIRECTED. TAKE 1 TABLET TWICE A DAY MON/WED/FRI ONLY   losartan (COZAAR) 25 MG tablet Take 0.5 tablets (12.5 mg total) by mouth daily.   Potassium Chloride ER 20 MEQ TBCR Take 0.5 tablets (10 mEq total) by mouth 3 (three) times a week. Mon/Wed/Fri   tamsulosin (FLOMAX) 0.4 MG CAPS capsule TAKE 1 CAPSULE BY MOUTH EVERY DAY   triamcinolone (KENALOG) 0.1 % paste Apply to affected area 1-2 times daily (Patient taking differently: 1 application  daily as needed (mouth sores).)   vitamin B-12 (CYANOCOBALAMIN) 1000 MCG tablet Take 1,000 mcg by mouth daily.   Zinc 50 MG CAPS Take 50 mg by mouth daily.     Review of Systems:   Please see the history of present illness.    All other systems reviewed and are negative.     EKGs/Labs/Other Test Reviewed:   EKG: EKG performed July 2023 demonstrates sinus rhythm with PACs  EKG Interpretation Date/Time:  Friday October 04 2023 13:52:50 EST Ventricular Rate:  71 PR Interval:  212 QRS Duration:  92 QT Interval:  402 QTC Calculation: 436 R Axis:   -31  Text Interpretation: Sinus rhythm with 1st degree A-V block Left axis deviation Incomplete right bundle branch block Left ventricular hypertrophy ( R in aVL , Romhilt-Estes ) Inferior infarct , age undetermined When compared with ECG of 24-May-2022 09:44, Premature atrial complexes are no longer Present Inferior infarct is now Present T wave inversion now evident in Inferior leads First degree heart block and iRBBB are new Confirmed by Alverda Skeans ([id number]) on 10/04/2023 2:09:43 PM         Risk Assessment/Calculations:          Physical Exam:   VS:  BP 134/86   Pulse 74   Ht 5\' 2"  (1.575 m)   Wt 175 lb (79.4 kg)   SpO2 96%   BMI 32.01 kg/m        Wt Readings from Last 3 Encounters:  10/04/23 175 lb  (79.4 kg)  08/08/23 182 lb 12.8 oz (82.9 kg)  04/11/23 175 lb (79.4 kg)      GENERAL:  No apparent distress, AOx3 HEENT:  No carotid bruits, +2 carotid impulses, no scleral icterus CAR: RRR no murmurs, gallops, rubs, or thrills RES:  Clear to auscultation bilaterally ABD:  Soft, nontender, nondistended, positive bowel sounds x 4 VASC:  +2 radial pulses, +2 carotid pulses NEURO:  CN 2-12 grossly intact; motor and sensory grossly intact PSYCH:  No active depression or anxiety EXT:  No edema, ecchymosis, or cyanosis  Signed, Orbie Pyo, MD  10/04/2023 2:28 PM    [facility] [address] [place], [place], [place]  [address] Phone: [phone]; Fax: [phone]   Note:  This document was prepared using Dragon voice recognition software and may include unintentional dictation errors.

## 2023-10-04 ENCOUNTER — Ambulatory Visit: Payer: Medicare Other | Attending: Internal Medicine | Admitting: Internal Medicine

## 2023-10-04 ENCOUNTER — Encounter: Payer: Self-pay | Admitting: Internal Medicine

## 2023-10-04 VITALS — BP 134/86 | HR 74 | Ht 62.0 in | Wt 175.0 lb

## 2023-10-04 DIAGNOSIS — I10 Essential (primary) hypertension: Secondary | ICD-10-CM

## 2023-10-04 DIAGNOSIS — R7303 Prediabetes: Secondary | ICD-10-CM | POA: Diagnosis not present

## 2023-10-04 DIAGNOSIS — I517 Cardiomegaly: Secondary | ICD-10-CM | POA: Diagnosis not present

## 2023-10-04 DIAGNOSIS — I340 Nonrheumatic mitral (valve) insufficiency: Secondary | ICD-10-CM | POA: Diagnosis not present

## 2023-10-04 DIAGNOSIS — N182 Chronic kidney disease, stage 2 (mild): Secondary | ICD-10-CM | POA: Diagnosis not present

## 2023-10-04 DIAGNOSIS — E785 Hyperlipidemia, unspecified: Secondary | ICD-10-CM

## 2023-10-04 DIAGNOSIS — I2510 Atherosclerotic heart disease of native coronary artery without angina pectoris: Secondary | ICD-10-CM | POA: Diagnosis not present

## 2023-10-04 DIAGNOSIS — Z6834 Body mass index (BMI) 34.0-34.9, adult: Secondary | ICD-10-CM

## 2023-10-04 MED ORDER — LOSARTAN POTASSIUM 25 MG PO TABS
12.5000 mg | ORAL_TABLET | Freq: Every day | ORAL | 3 refills | Status: DC
Start: 2023-10-04 — End: 2023-10-09

## 2023-10-04 NOTE — Addendum Note (Signed)
Addended by: Lars Mage on: 10/04/2023 02:31 PM   Modules accepted: Orders

## 2023-10-04 NOTE — Patient Instructions (Signed)
Medication Instructions:  START Losartan 12.5mg  daily *If you need a refill on your cardiac medications before your next appointment, please call your pharmacy*   Lab Work: BMET and Lipoprotein(a) next week If you have labs (blood work) drawn today and your tests are completely normal, you will receive your results only by: MyChart Message (if you have MyChart) OR A paper copy in the mail If you have any lab test that is abnormal or we need to change your treatment, we will call you to review the results.  Testing/Procedures: Ambulatory referral to pharmD (lipid clinic)  Follow-Up: At [facility], you and your health needs are our priority.  As part of our continuing mission to provide you with exceptional heart care, we have created designated Provider Care Teams.  These Care Teams include your primary Cardiologist (physician) and Advanced Practice Providers (APPs -  Physician Assistants and Nurse Practitioners) who all work together to provide you with the care you need, when you need it.  Your next appointment:   9 month(s)  Provider:   Orbie Pyo, MD

## 2023-10-09 ENCOUNTER — Telehealth: Payer: Self-pay | Admitting: Internal Medicine

## 2023-10-09 MED ORDER — TELMISARTAN 20 MG PO TABS: 20.0000 mg | ORAL_TABLET | Freq: Every day | ORAL | 3 refills | Status: DC

## 2023-10-09 NOTE — Telephone Encounter (Signed)
Spoke with the patient's wife and advised on recommendations from Dr. Lynnette Caffey. Prescription has been sent in.

## 2023-10-09 NOTE — Telephone Encounter (Signed)
Patient's wife reports that about 40 minutes after he takes his losartan in the evenings that he has back and leg pain that he describes as sharp. This has happened every night since starting on the losartan last Friday. Advised that I would send a message to Dr. Lynnette Caffey and PharmD for alternative recommendations.

## 2023-10-09 NOTE — Telephone Encounter (Signed)
Pt c/o medication issue:  1. Name of Medication: losartan (COZAAR) 25 MG tablet   2. How are you currently taking this medication (dosage and times per day)?   Take 0.5 tablets (12.5 mg total) by mouth daily.    3. Are you having a reaction (difficulty breathing--STAT)? No  4. What is your medication issue? Pt's wife states the pt has been experiencing leg and back pain from taking this medication. Please advise

## 2023-10-14 DIAGNOSIS — E785 Hyperlipidemia, unspecified: Secondary | ICD-10-CM | POA: Diagnosis not present

## 2023-10-14 DIAGNOSIS — I10 Essential (primary) hypertension: Secondary | ICD-10-CM | POA: Diagnosis not present

## 2023-10-14 DIAGNOSIS — I2510 Atherosclerotic heart disease of native coronary artery without angina pectoris: Secondary | ICD-10-CM | POA: Diagnosis not present

## 2023-10-14 DIAGNOSIS — I340 Nonrheumatic mitral (valve) insufficiency: Secondary | ICD-10-CM | POA: Diagnosis not present

## 2023-10-14 DIAGNOSIS — I517 Cardiomegaly: Secondary | ICD-10-CM | POA: Diagnosis not present

## 2023-10-15 LAB — BASIC METABOLIC PANEL
BUN/Creatinine Ratio: 19 (ref 10–24)
BUN: 16 mg/dL (ref 8–27)
CO2: 24 mmol/L (ref 20–29)
Calcium: 9.1 mg/dL (ref 8.6–10.2)
Chloride: 107 mmol/L — ABNORMAL HIGH (ref 96–106)
Creatinine, Ser: 0.85 mg/dL (ref 0.76–1.27)
Glucose: 101 mg/dL — ABNORMAL HIGH (ref 70–99)
Potassium: 4.3 mmol/L (ref 3.5–5.2)
Sodium: 143 mmol/L (ref 134–144)
eGFR: 92 mL/min/{1.73_m2} (ref >59)

## 2023-10-15 LAB — LIPOPROTEIN A (LPA)

## 2023-10-28 ENCOUNTER — Encounter: Payer: Medicare Other | Admitting: Family Medicine

## 2023-11-05 ENCOUNTER — Other Ambulatory Visit: Payer: Self-pay | Admitting: Family Medicine

## 2023-11-08 ENCOUNTER — Ambulatory Visit: Payer: Medicare Other

## 2023-11-19 ENCOUNTER — Ambulatory Visit (INDEPENDENT_AMBULATORY_CARE_PROVIDER_SITE_OTHER): Payer: Medicare Other

## 2023-11-19 VITALS — Wt 175.0 lb

## 2023-11-19 DIAGNOSIS — Z0000 Encounter for general adult medical examination without abnormal findings: Secondary | ICD-10-CM

## 2023-11-19 NOTE — Patient Instructions (Signed)
Frank [name] , Thank you for taking time to come for your Medicare Wellness Visit. I appreciate your ongoing commitment to your health goals. Please review the following plan we discussed and let me know if I can assist you in the future.   Referrals/Orders/Follow-Ups/Clinician Recommendations: increase walking  Aim for 30 minutes of exercise or brisk walking, 6-8 glasses of water, and 5 servings of fruits and vegetables each day.   This is a list of the screening recommended for you and due dates:  Health Maintenance  Topic Date Due   Hepatitis C Screening  Never done   Zoster (Shingles) Vaccine (1 of 2) Never done   Flu Shot  01/27/2024*   Medicare Annual Wellness Visit  11/18/2024   DTaP/Tdap/Td vaccine (3 - Td or Tdap) 08/16/2025   Cologuard (Stool DNA test)  09/01/2026   Pneumonia Vaccine  Completed   HPV Vaccine  Aged Out   COVID-19 Vaccine  Discontinued  *Topic was postponed. The date shown is not the original due date.    Advanced directives: (Copy Requested) Please bring a copy of your health care power of attorney and living will to the office to be added to your chart at your convenience.  Next Medicare Annual Wellness Visit scheduled for next year: Yes

## 2023-11-19 NOTE — Progress Notes (Signed)
Subjective:   Frank [name] is a 73 y.o. male who presents for Medicare Annual/Subsequent preventive examination.  Visit Complete: Virtual I connected with  [name] on 11/19/23 by a audio enabled telemedicine application and verified that I am speaking with the correct person using two identifiers.  Patient Location: Home  Provider Location: Office/Clinic  I discussed the limitations of evaluation and management by telemedicine. The patient expressed understanding and agreed to proceed.  Vital Signs: Because this visit was a virtual/telehealth visit, some criteria may be missing or patient reported. Any vitals not documented were not able to be obtained and vitals that have been documented are patient reported.   Cardiac Risk Factors include: advanced age (>15men, >40 women);dyslipidemia;hypertension;male gender;obesity (BMI >30kg/m2)     Objective:    Today's Vitals   11/19/23 1117  Weight: 175 lb (79.4 kg)   Body mass index is 32.01 kg/m.     11/19/2023   11:21 AM 06/05/2023   10:46 AM 04/24/2023    2:40 PM 04/11/2023    9:30 PM 04/04/2023    9:17 AM 01/03/2023    1:44 PM 12/18/2022    7:00 PM  Advanced Directives  Does Patient Have a Medical Advance Directive? No No No No No Yes No  Does patient want to make changes to medical advance directive?      No - Patient declined   Would patient like information on creating a medical advance directive? No - Patient declined No - Patient declined Yes (MAU/Ambulatory/Procedural Areas - Information given)  Yes (MAU/Ambulatory/Procedural Areas - Information given)  No - Patient declined    Current Medications (verified) Outpatient Encounter Medications as of 11/19/2023  Medication Sig   acetaminophen (TYLENOL) 325 MG tablet Take 1-2 tablets (325-650 mg total) by mouth every 6 (six) hours as needed for mild pain (pain score 1-3 or temp > 100.5).   ascorbic acid (VITAMIN C) 500 MG tablet Take 500 mg by mouth daily.   aspirin 81  MG chewable tablet Chew 1 tablet (81 mg total) by mouth 2 (two) times daily. (Patient taking differently: Chew 81 mg by mouth daily.)   cholecalciferol (VITAMIN D) 25 MCG (1000 UNIT) tablet Take 1,000 Units by mouth daily.   fluticasone (FLONASE) 50 MCG/ACT nasal spray Place 1 spray into both nostrils daily as needed for allergies or rhinitis.   furosemide (LASIX) 20 MG tablet TAKE 1 TABLET (20 MG TOTAL) BY MOUTH AS DIRECTED. TAKE 1 TABLET TWICE A DAY MON/WED/FRI ONLY   Potassium Chloride ER 20 MEQ TBCR Take 0.5 tablets (10 mEq total) by mouth 3 (three) times a week. Mon/Wed/Fri   tamsulosin (FLOMAX) 0.4 MG CAPS capsule TAKE 1 CAPSULE BY MOUTH EVERY DAY   telmisartan (MICARDIS) 20 MG tablet Take 1 tablet (20 mg total) by mouth daily.   triamcinolone (KENALOG) 0.1 % paste Apply to affected area 1-2 times daily (Patient taking differently: 1 application  daily as needed (mouth sores).)   vitamin B-12 (CYANOCOBALAMIN) 1000 MCG tablet Take 1,000 mcg by mouth daily.   Zinc 50 MG CAPS Take 50 mg by mouth daily.   No facility-administered encounter medications on file as of 11/19/2023.    Allergies (verified) Meloxicam, Atorvastatin, Metoprolol, and Tomato   History: Past Medical History:  Diagnosis Date   Allergy    Asthma    Coronary artery disease    GERD (gastroesophageal reflux disease)    Headache(784.0)    History of kidney stones    Hypertension  Osteoarthritis of knee    right   Past Surgical History:  Procedure Laterality Date   CARDIAC CATHETERIZATION     HERNIA REPAIR  1969, 2009   kidney stones  05/20/1995   KNEE SURGERY  02/26/1989   MITRAL VALVE REPAIR  01/02/2022   NASAL SINUS SURGERY  10/24/1992   RIGHT/LEFT HEART CATH AND CORONARY ANGIOGRAPHY N/A 11/15/2021   Procedure: RIGHT/LEFT HEART CATH AND CORONARY ANGIOGRAPHY;  Surgeon: Orbie Pyo, MD;  Location: [facility]IVE [facility];  Service: Cardiovascular;  Laterality: N/A;   ROTATOR CUFF REPAIR Left 04/25/1993    TEE WITHOUT CARDIOVERSION N/A 08/24/2021   Procedure: TRANSESOPHAGEAL ECHOCARDIOGRAM (TEE);  Surgeon: Jodelle Red, MD;  Location: [facility] ENDOSCOPY;  Service: Cardiovascular;  Laterality: N/A;   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   Procedure: LEFT TOTAL KNEE ARTHROPLASTY;  Surgeon: Cammy Copa, MD;  Location: [facility] OR;  Service: Orthopedics;  Laterality: Left;   TOTAL KNEE ARTHROPLASTY Right 04/11/2023   Procedure: RIGHT TOTAL KNEE ARTHROPLASTY-CEMENTED;  Surgeon: Cammy Copa, MD;  [facility];  Service: Orthopedics;  Laterality: Right;   Family History  Problem Relation Age of Onset   Kidney disease Mother    COPD Father    Pneumonia Father    Cardiomyopathy Brother    Hypertension Sister    Heart disease Brother    Hypertension Brother    Hypertension Brother    Hypertension Sister    Diabetes Sister    Osteoarthritis Sister    Social History   Socioeconomic History   Marital status: Married    Spouse name: Not on file   Number of children: 2   Years of education: 11   Highest education level: 11th grade  Occupational History   Not on file  Tobacco Use   Smoking status: Former    Current packs/day: 0.00    Types: Cigarettes    Quit date: 1972    Years since quitting: 53.0   Smokeless tobacco: Never   Tobacco comments:    quit in 1972  Vaping Use   Vaping status: Never Used  Substance and Sexual Activity   Alcohol use: No   Drug use: No   Sexual activity: Not on file  Other Topics Concern   Not on file  Social History Narrative   Not on file   Social Drivers of Health   Financial Resource Strain: Low Risk  (11/19/2023)   Overall Financial Resource Strain (CARDIA)    Difficulty of Paying Living Expenses: Not hard at all  Food Insecurity: No Food Insecurity (11/19/2023)   Hunger Vital Sign    Worried About Running Out of Food in the Last Year: Never true    Ran Out of Food in the Last Year: Never true  Transportation Needs: No  Transportation Needs (11/19/2023)   PRAPARE - Administrator, Civil Service (Medical): No    Lack of Transportation (Non-Medical): No  Physical Activity: Sufficiently Active (11/19/2023)   Exercise Vital Sign    Days of Exercise per Week: 5 days    Minutes of Exercise per Session: 30 min  Stress: No Stress Concern Present (11/19/2023)   [employer] of Occupational Health - Occupational Stress Questionnaire    Feeling of Stress : Not at all  Social Connections: Moderately Isolated (11/19/2023)   Social Connection and Isolation Panel [NHANES]    Frequency of Communication with Friends and Family: More than three times a week    Frequency of Social Gatherings with Friends  and Family: More than three times a week    Attends Religious Services: Never    Active Member of Clubs or Organizations: No    Attends Engineer, structural: Never    Marital Status: Married    Tobacco Counseling Counseling given: Not Answered Tobacco comments: quit in 1972   Clinical Intake:  Pre-visit preparation completed: Yes  Pain : No/denies pain     BMI - recorded: 32.01 Nutritional Status: BMI > 30  Obese Nutritional Risks: None Diabetes: No  How often do you need to have someone help you when you read instructions, pamphlets, or other written materials from your doctor or pharmacy?: 1 - Never  Interpreter Needed?: No  Information entered by :: Lanier Ensign, LPN   Activities of Daily Living    11/19/2023   11:19 AM 04/11/2023    9:30 PM  In your present state of health, do you have any difficulty performing the following activities:  Hearing? 0 0  Vision? 0 0  Difficulty concentrating or making decisions? 0 0  Walking or climbing stairs? 0   Dressing or bathing? 0   Doing errands, shopping? 0 0  Preparing Food and eating ? N   Using the Toilet? N   In the past six months, have you accidently leaked urine? N   Do you have problems with loss of bowel control? N    Managing your Medications? N   Managing your Finances? N   Housekeeping or managing your Housekeeping? N     Patient Care Team: Ardith Dark, MD as PCP - General (Family Medicine) Orbie Pyo, MD as PCP - Cardiology (Cardiology)  Indicate any recent Medical Services you may have received from other than [facility] providers in the past year (date may be approximate).     Assessment:   This is a routine wellness examination for [place].  Hearing/Vision screen Hearing Screening - Comments:: Pt denies any hearing issues  Vision Screening - Comments:: Pt follows up with Dr Lucina Mellow for annual eye exams    Goals Addressed               This Visit's Progress     Patient Stated (pt-stated)        Increase exercise        Depression Screen    11/19/2023   11:22 AM 08/08/2023    7:37 AM 11/15/2022   11:32 AM 08/23/2022    7:35 AM 07/23/2022    9:50 AM 05/24/2022   10:04 AM 02/16/2022    1:04 PM  PHQ 2/9 Scores  PHQ - 2 Score 0 0 0 0 0 0 0    Fall Risk    11/19/2023   11:24 AM 08/08/2023    7:37 AM 11/15/2022   11:36 AM 08/23/2022    7:35 AM 05/24/2022   10:36 AM  Fall Risk   Falls in the past year? 0 0 1 1 0  Number falls in past yr: 0 0 1 1 0  Injury with Fall? 0 0 0 0 0  Risk for fall due to : No Fall Risks No Fall Risks Impaired vision;Impaired balance/gait No Fall Risks Impaired balance/gait;Impaired mobility;Orthopedic patient  Risk for fall due to: Comment   related to the knee  arthritis in both knees  Follow up Falls prevention discussed  Falls prevention discussed  Falls evaluation completed    MEDICARE RISK AT HOME: Medicare Risk at Home Any stairs in or around the home?: Yes If so,  are there any without handrails?: No Home free of loose throw rugs in walkways, pet beds, electrical cords, etc?: Yes Adequate lighting in your home to reduce risk of falls?: Yes Life alert?: No Use of a cane, walker or w/c?: No Grab bars in the bathroom?: No Shower chair  or bench in shower?: No Elevated toilet seat or a handicapped toilet?: No  TIMED UP AND GO:  Was the test performed?  No    Cognitive Function:        11/19/2023   11:26 AM 11/15/2022   11:45 AM 11/15/2022   11:40 AM  6CIT Screen  What Year? 0 points  0 points  What month? 0 points  0 points  What time? 0 points  0 points  Count back from 20 0 points  0 points  Months in reverse 4 points  4 points  Repeat phrase 0 points -- 2 points  Total Score 4 points  6 points    Immunizations Immunization History  Administered Date(s) Administered   Influenza Split 08/21/2011   Influenza Whole 08/02/2008, 08/29/2010   Influenza, High Dose Seasonal PF 09/23/2017   Influenza,inj,Quad PF,6+ Mos 08/17/2015   PNEUMOCOCCAL CONJUGATE-20 08/08/2023   Pneumococcal Conjugate-13 05/13/2017   Pneumococcal Polysaccharide-23 08/11/2001, 01/13/2007   Td 01/10/2006   Tdap 08/17/2015    TDAP status: Up to date  Flu Vaccine status: Due, Education has been provided regarding the importance of this vaccine. Advised may receive this vaccine at local pharmacy or Health Dept. Aware to provide a copy of the vaccination record if obtained from local pharmacy or Health Dept. Verbalized acceptance and understanding.  Pneumococcal vaccine status: Up to date  Covid-19 vaccine status: Declined, Education has been provided regarding the importance of this vaccine but patient still declined. Advised may receive this vaccine at local pharmacy or Health Dept.or vaccine clinic. Aware to provide a copy of the vaccination record if obtained from local pharmacy or Health Dept. Verbalized acceptance and understanding.  Qualifies for Shingles Vaccine? Yes   Zostavax completed No   Shingrix Completed?: No.    Education has been provided regarding the importance of this vaccine. Patient has been advised to call insurance company to determine out of pocket expense if they have not yet received this vaccine. Advised may also  receive vaccine at local pharmacy or Health Dept. Verbalized acceptance and understanding.  Screening Tests Health Maintenance  Topic Date Due   Hepatitis C Screening  Never done   Zoster Vaccines- Shingrix (1 of 2) Never done   INFLUENZA VACCINE  01/27/2024 (Originally 05/30/2023)   Medicare Annual Wellness (AWV)  11/18/2024   DTaP/Tdap/Td (3 - Td or Tdap) 08/16/2025   Fecal DNA (Cologuard)  09/01/2026   Pneumonia Vaccine 69+ Years old  Completed   HPV VACCINES  Aged Out   COVID-19 Vaccine  Discontinued    Health Maintenance  Health Maintenance Due  Topic Date Due   Hepatitis C Screening  Never done   Zoster Vaccines- Shingrix (1 of 2) Never done    Colorectal cancer screening: Type of screening: Cologuard. Completed 09/02/23. Repeat every 3 years  Additional Screening:  Hepatitis C Screening: does qualify;  Vision Screening: Recommended annual ophthalmology exams for early detection of glaucoma and other disorders of the eye. Is the patient up to date with their annual eye exam?  Yes  Who is the provider or what is the name of the office in which the patient attends annual eye exams? Dr Lucina Mellow  If pt  is not established with a provider, would they like to be referred to a provider to establish care? No .   Dental Screening: Recommended annual dental exams for proper oral hygiene    Community Resource Referral / Chronic Care Management: CRR required this visit?  No   CCM required this visit?  No     Plan:     I have personally reviewed and noted the following in the patient's chart:   Medical and social history Use of alcohol, tobacco or illicit drugs  Current medications and supplements including opioid prescriptions. Patient is not currently taking opioid prescriptions. Functional ability and status Nutritional status Physical activity Advanced directives List of other physicians Hospitalizations, surgeries, and ER visits in previous 12  months Vitals Screenings to include cognitive, depression, and falls Referrals and appointments  In addition, I have reviewed and discussed with patient certain preventive protocols, quality metrics, and best practice recommendations. A written personalized care plan for preventive services as well as general preventive health recommendations were provided to patient.     Marzella Schlein, LPN   11/28/8655   After Visit Summary: (MyChart) Due to this being a telephonic visit, the after visit summary with patients personalized plan was offered to patient via MyChart   Nurse Notes: none

## 2023-11-27 IMAGING — MR MR CARD MORPHOLOGY WO/W CM
45 of 48 series · 45 of 48 positions shown · IV contrast (Contrast agent)
Comparison: none

CLINICAL DATA: Evalute mitral regurgitation

EXAM:
CARDIAC MRI
TECHNIQUE: The patient was scanned on a 1.5 Tesla Siemens magnet. A dedicated
cardiac coil was used. Functional imaging was done using Fiesta
sequences. [DATE], and 4 chamber views were done to assess for RWMA's.
Modified Mohdfaiz rule using a short axis stack was used to
calculate an ejection fraction on a dedicated work station using
Circle software. The patient received 8 cc of Gadavist. After 10
minutes inversion recovery sequences were used to assess for
infiltration and scar tissue.
CONTRAST:  8 cc  of Gadavist

[Series 4: [id number] · axial · 8.0mm · 1.48mm/px · 1 of 16 slices shown]
[im 1/16]
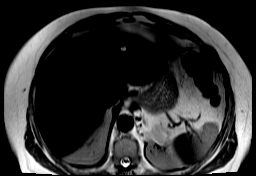

[Series 8: bSSFP · oblique · 8.0mm · 1.61mm/px · 1 of 25 slices shown (1 of 19)]
[im 1/25]
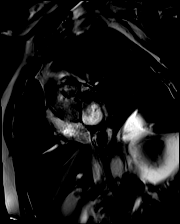

[Series 9: bSSFP · oblique · 8.0mm · 1.61mm/px · 1 of 25 slices shown (2 of 19)]
[im 1/25]
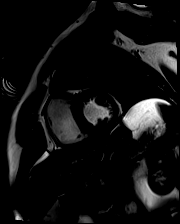

[Series 10: bSSFP · oblique · 8.0mm · 1.61mm/px · 1 of 25 slices shown (3 of 19)]
[im 1/25]
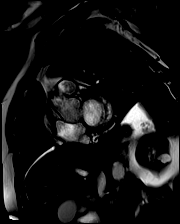

[Series 11: bSSFP · oblique · 8.0mm · 1.61mm/px · 1 of 25 slices shown (4 of 19)]
[im 1/25]
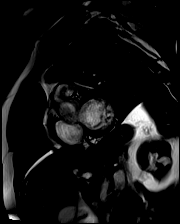

[Series 12: bSSFP · oblique · 8.0mm · 1.61mm/px · 1 of 25 slices shown (5 of 19)]
[im 1/25]
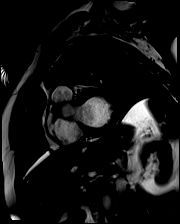

[Series 13: bSSFP · oblique · 8.0mm · 1.61mm/px · 1 of 25 slices shown (6 of 19)]
[im 1/25]
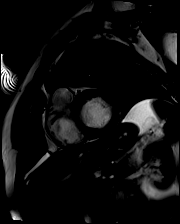

[Series 14: bSSFP · oblique · 8.0mm · 1.61mm/px · 1 of 25 slices shown (7 of 19)]
[im 1/25]
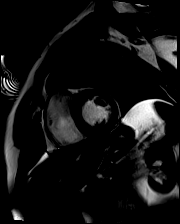

[Series 15: bSSFP · oblique · 8.0mm · 1.61mm/px · 1 of 25 slices shown (8 of 19)]
[im 1/25]
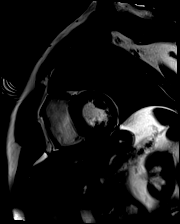

[Series 16: bSSFP · oblique · 8.0mm · 1.61mm/px · 1 of 25 slices shown (9 of 19)]
[im 1/25]
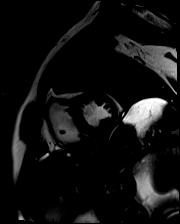

[Series 17: bSSFP · oblique · 8.0mm · 1.61mm/px · 1 of 25 slices shown (10 of 19)]
[im 1/25]
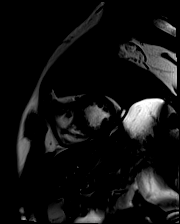

[Series 18: bSSFP · oblique · 8.0mm · 1.61mm/px · 1 of 25 slices shown (11 of 19)]
[im 1/25]
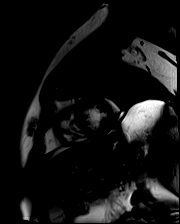

[Series 19: bSSFP · oblique · 8.0mm · 1.61mm/px · 1 of 25 slices shown (12 of 19)]
[im 1/25]
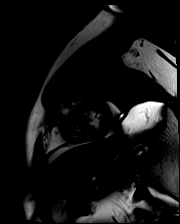

[Series 20: bSSFP · oblique · 8.0mm · 1.61mm/px · 1 of 25 slices shown (13 of 19)]
[im 1/25]
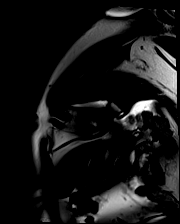

[Series 21: bSSFP · oblique · 8.0mm · 1.61mm/px · 1 of 25 slices shown (14 of 19)]
[im 1/25]
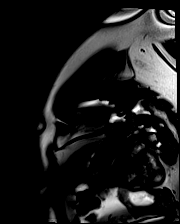

[Series 22: bSSFP · oblique · 8.0mm · 1.61mm/px · 1 of 25 slices shown (15 of 19)]
[im 1/25]
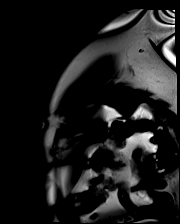

[Series 23: bSSFP · oblique · 8.0mm · 1.61mm/px · 1 of 25 slices shown (16 of 19)]
[im 1/25]
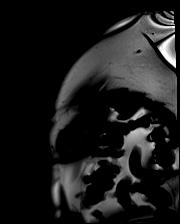

[Series 24: bSSFP · oblique · 6.0mm · 1.41mm/px · 1 of 25 slices shown (17 of 19)]
[im 1/25]
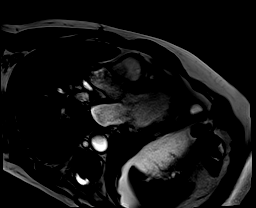

[Series 25: bSSFP · oblique · 6.0mm · 1.41mm/px · 1 of 25 slices shown (18 of 19)]
[im 1/25]
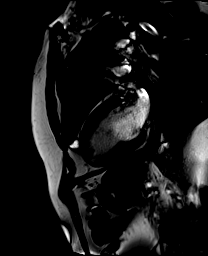

[Series 26: bSSFP · axial · 6.0mm · 1.41mm/px · 1 of 25 slices shown (19 of 19)]
[im 1/25]
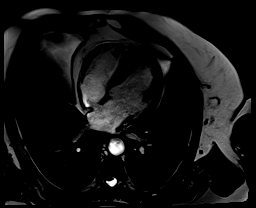

[Series 27: (id)_long_t1 · oblique · 8.0mm · 1.56mm/px · 1 of 24 slices shown]
[im 1/24]
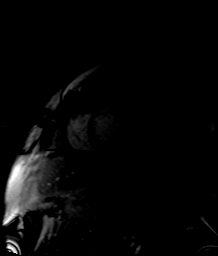

[Series 28: (id)_long_t1_moco · oblique · 8.0mm · 1.56mm/px · 1 of 24 slices shown]
[im 1/24]
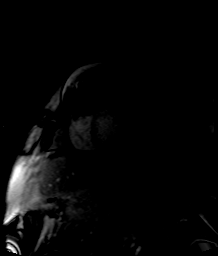

[Series 31: cine_trufi_cs_rt_short axis · oblique · 8.0mm · 1.73mm/px · 1 of 49 slices shown (1 of 16)]
[im 1/49]
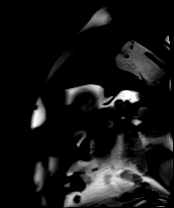

[Series 31: cine_trufi_cs_rt_short axis · oblique · 8.0mm · 1.73mm/px · 1 of 49 slices shown (2 of 16)]
[im 1/49]
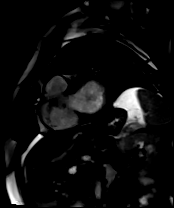

[Series 31: cine_trufi_cs_rt_short axis · oblique · 8.0mm · 1.73mm/px · 1 of 49 slices shown (3 of 16)]
[im 1/49]
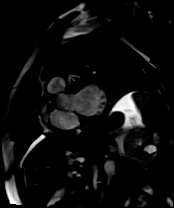

[Series 31: cine_trufi_cs_rt_short axis · oblique · 8.0mm · 1.73mm/px · 1 of 49 slices shown (4 of 16)]
[im 1/49]
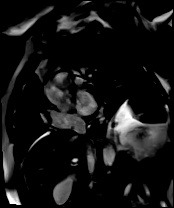

[Series 31: cine_trufi_cs_rt_short axis · oblique · 8.0mm · 1.73mm/px · 1 of 49 slices shown (5 of 16)]
[im 1/49]
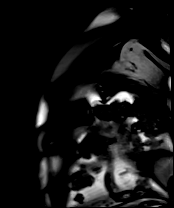

[Series 31: cine_trufi_cs_rt_short axis · oblique · 8.0mm · 1.73mm/px · 1 of 49 slices shown (6 of 16)]
[im 1/49]
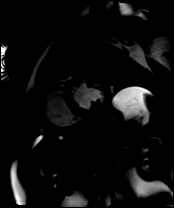

[Series 31: cine_trufi_cs_rt_short axis · oblique · 8.0mm · 1.73mm/px · 1 of 49 slices shown (7 of 16)]
[im 1/49]
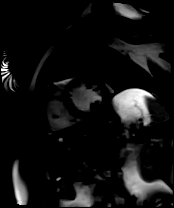

[Series 31: cine_trufi_cs_rt_short axis · oblique · 8.0mm · 1.73mm/px · 1 of 49 slices shown (8 of 16)]
[im 1/49]
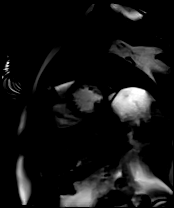

[Series 31: cine_trufi_cs_rt_short axis · oblique · 8.0mm · 1.73mm/px · 1 of 49 slices shown (9 of 16)]
[im 1/49]
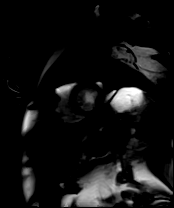

[Series 31: cine_trufi_cs_rt_short axis · oblique · 8.0mm · 1.73mm/px · 1 of 49 slices shown (10 of 16)]
[im 1/49]
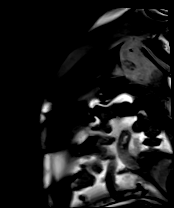

[Series 31: cine_trufi_cs_rt_short axis · oblique · 8.0mm · 1.73mm/px · 1 of 49 slices shown (11 of 16)]
[im 1/49]
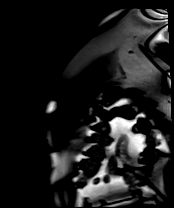

[Series 31: cine_trufi_cs_rt_short axis · oblique · 8.0mm · 1.73mm/px · 1 of 49 slices shown (12 of 16)]
[im 1/49]
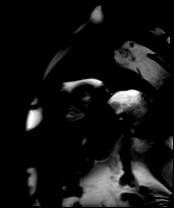

[Series 31: cine_trufi_cs_rt_short axis · oblique · 8.0mm · 1.73mm/px · 1 of 49 slices shown (13 of 16)]
[im 1/49]
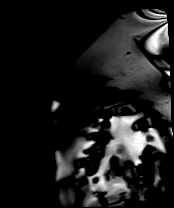

[Series 31: cine_trufi_cs_rt_short axis · oblique · 8.0mm · 1.73mm/px · 1 of 49 slices shown (14 of 16)]
[im 1/49]
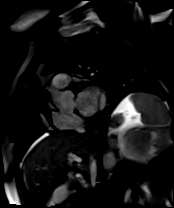

[Series 31: cine_trufi_cs_rt_short axis · oblique · 8.0mm · 1.73mm/px · 1 of 49 slices shown (15 of 16)]
[im 1/49]
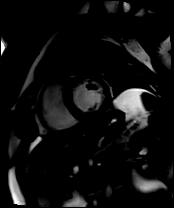

[Series 31: cine_trufi_cs_rt_short axis · oblique · 8.0mm · 1.73mm/px · 1 of 49 slices shown (16 of 16)]
[im 1/49]
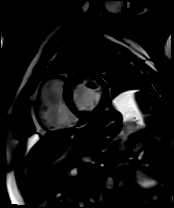

[Series 32: (id)_trufi · oblique · 8.0mm · 2.08mm/px · 1 of 9 slices shown]
[im 1/9]
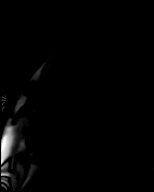

[Series 33: (id)_trufi_moco · oblique · 8.0mm · 2.08mm/px · 1 of 9 slices shown]
[im 1/9]
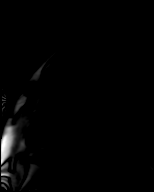

[Series 36: pre short axis · oblique · non-contrast · 8.0mm · 2.25mm/px · 1 of 10 slices shown (1 of 5)]
[im 1/10]
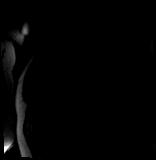

[Series 37: pre short axis · oblique · non-contrast · 8.0mm · 2.25mm/px · 1 of 10 slices shown (2 of 5)]
[im 1/10]
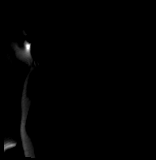

[Series 38: pre short axis · oblique · non-contrast · 8.0mm · 2.25mm/px · 1 of 10 slices shown (3 of 5)]
[im 1/10]
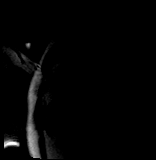

[Series 39: pre short axis · oblique · non-contrast · 8.0mm · 2.25mm/px · 1 of 10 slices shown (4 of 5)]
[im 1/10]
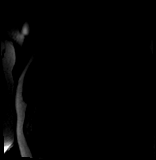

[Series 40: pre short axis · oblique · non-contrast · 8.0mm · 2.25mm/px · 1 of 10 slices shown (5 of 5)]
[im 1/10]
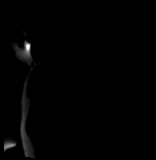

[45 of 48 positions shown; findings below may reference images not displayed]

FINDINGS: Left ventricle:

-Normal size

-Mild hypertrophy

-Normal systolic function

-Mild nonspecific ECV elevation (29%)

-Subendocardial LGE in basal lateral wall, suggesting focal infarct

-RV insertion site LGE

LV EF: 53% (Normal 56-78%)

Absolute volumes:

LV EDV: 153mL (Normal 77-195 mL)

LV ESV: 71mL (Normal 19-72 mL)

LV SV: 82mL (Normal 51-133 mL)

CO: 5.4L/min (Normal 2.8-8.8 L/min)

Indexed volumes:

LV EDV: 79mL/sq-m (Normal 47-92 mL/sq-m)

LV ESV: 37mL/sq-m (Normal 13-30 mL/sq-m)

LV SV: 42mL/sq-m (Normal 32-62 mL/sq-m)

CI: 2.8L/min/sq-m (Normal 1.7-4.2 L/min/sq-m)

Right ventricle: Normal size and low normal systolic function

RV EF:  48% (Normal 47-74%)

Absolute volumes:

RV EDV: 148mL (Normal 88-227 mL)

RV ESV: 77mL (Normal 23-103 mL)

RV SV: 71mL (Normal 52-138 mL)

CO: 4.6L/min (Normal 2.8-8.8 L/min)

Indexed volumes:

RV EDV: 77mL/sq-m (Normal 55-105 mL/sq-m)

RV ESV: 40mL/sq-m (Normal 15-43 mL/sq-m)

RV SV: 37mL/sq-m (Normal 32-64 mL/sq-m)

CI: 2.4L/min/sq-m (Normal 1.7-4.2 L/min/sq-m)

Left atrium: Mild enlargement

Right atrium: Normal size

Mitral valve: Severe regurgitation (regurgitant fraction 48%)

Aortic valve: Tricuspid. No regurgitation

Tricuspid valve: Mild to moderate regurgitation

Pulmonic valve: No regurgitation

Aorta: Normal proximal ascending aort

Pericardium: Normal
IMPRESSION: 1.  Severe mitral regurgitation (regurgitant fraction 48%)

2. Subendocardial late gadolinium enhancement in LV basal lateral
wall, consistent with small infarct

3. RV insertion site LGE, which is a nonspecific finding often seen
in setting of elevated pulmonary pressures

4. Normal LV size, mild hypertrophy, and normal systolic function
(EF 53%)

5.  Normal RV size and low normal systolic function (EF 48%)

## 2023-12-02 ENCOUNTER — Encounter: Payer: Self-pay | Admitting: Family Medicine

## 2023-12-02 ENCOUNTER — Ambulatory Visit: Payer: Medicare Other | Admitting: Family Medicine

## 2023-12-02 VITALS — BP 130/78 | HR 66 | Temp 97.7°F | Ht 62.0 in | Wt 183.0 lb

## 2023-12-02 DIAGNOSIS — I10 Essential (primary) hypertension: Secondary | ICD-10-CM

## 2023-12-02 DIAGNOSIS — N401 Enlarged prostate with lower urinary tract symptoms: Secondary | ICD-10-CM

## 2023-12-02 DIAGNOSIS — R39198 Other difficulties with micturition: Secondary | ICD-10-CM

## 2023-12-02 DIAGNOSIS — M545 Low back pain, unspecified: Secondary | ICD-10-CM

## 2023-12-02 LAB — URINALYSIS, ROUTINE W REFLEX MICROSCOPIC
Bilirubin Urine: NEGATIVE
Hgb urine dipstick: NEGATIVE
Ketones, ur: NEGATIVE
Leukocytes,Ua: NEGATIVE
Nitrite: NEGATIVE
RBC / HPF: NONE SEEN (ref 0–?)
Specific Gravity, Urine: 1.01 (ref 1.000–1.030)
Total Protein, Urine: NEGATIVE
Urine Glucose: NEGATIVE
Urobilinogen, UA: 0.2 (ref 0.0–1.0)
pH: 6.5 (ref 5.0–8.0)

## 2023-12-02 MED ORDER — METHYLPREDNISOLONE ACETATE 80 MG/ML IJ SUSP
80.0000 mg | Freq: Once | INTRAMUSCULAR | Status: AC
  Administered 2023-12-02: 80 mg via INTRAMUSCULAR

## 2023-12-02 MED ORDER — CYCLOBENZAPRINE HCL 10 MG PO TABS: 10.0000 mg | ORAL_TABLET | Freq: Three times a day (TID) | ORAL | 0 refills | Status: DC | PRN

## 2023-12-02 NOTE — Assessment & Plan Note (Signed)
Home readings at goal on telmisartan 20 mg daily.

## 2023-12-02 NOTE — Patient Instructions (Addendum)
It was very nice to see you today!  We will check a urine sample today.   Please double up on the Flomax.  We may need to send you to see the urologist.  You have a strain in your back.  Please work on the exercises.   We will give you an injection of depo medrol today.   Please start the flexeril  Let me know if not improving.   No follow-ups on file.   Take care, Dr Jimmey Ralph  PLEASE NOTE:  If you had any lab tests, please let us know if you have not heard back within a few days. You may see your results on mychart before we have a chance to review them but we will give you a call once they are reviewed by [place].   If we ordered any referrals today, please let us know if you have not heard from their office within the next week.   If you had any urgent prescriptions sent in today, please check with the pharmacy within an hour of our visit to make sure the prescription was transmitted appropriately.   Please try these tips to maintain a healthy lifestyle:  Eat at least 3 REAL meals and 1-2 snacks per day.  Aim for no more than 5 hours between eating.  If you eat breakfast, please do so within one hour of getting up.   Each meal should contain half fruits/vegetables, one quarter protein, and one quarter carbs (no bigger than a computer mouse)  Cut down on sweet beverages. This includes juice, soda, and sweet tea.   Drink at least 1 glass of water with each meal and aim for at least 8 glasses per day  Exercise at least 150 minutes every week.

## 2023-12-02 NOTE — Addendum Note (Signed)
Addended by: Dyann Kief on: 12/02/2023 11:42 AM   Modules accepted: Orders

## 2023-12-02 NOTE — Progress Notes (Signed)
   BRYNDAN BILYK is a 73 y.o. male who presents today for an office visit.  Assessment/Plan:  New/Acute Problems: Urinary Hesitancy Likely due to BPH. Will check UA and urine culture to rule out urinary tract infection. No red flags or signs of systemic illnesses.  He advised him to increase his Flomax to 0.8 mg daily as below.  He may need to be referred to urology depending on above workup and progression of symptoms.  Low Back Pain  No red flags.  Exam consistent with muscular strain.  Exam today is otherwise reassuring. We are ruling out UTI as above.  We discussed home exercise program and handout was given.  Will give 80 mg of Depo-Medrol today.  Also start Flexeril.  He will let us know if not proving over next couple of weeks and we can refer to PT or sports medicine.  Chronic Problems Addressed Today: Benign localized prostatic hyperplasia with lower urinary tract symptoms (LUTS) Having more urinary hesitancy symptoms.  Will increase his Flomax to 0.8 mg daily.  His PSA a few months ago was normal.  Checking UA and urine culture as above.  May need referral to urology if symptoms persist despite above.  Essential hypertension Home readings at goal on telmisartan 20 mg daily.     Subjective:  HPI:  See Assessment / plan for status of chronic conditions.  Patient is here with slow urinary flow.He does have a history of BPH and is taking flomax. Was doing fine until worsening within the last couple of weeks. No dysuria or hematuria. It does have some odor. He does not have to push to get out urine but notice that his stream has significantly slowed. He is not getting much urgency.   Back started about a week ago. He did carry a large bag of bird seed a few days. Tried using heat and ice with some improvement. Took tylenol and ibuprofen with some improvement. Pain comes and goes. Worse with certain motions and movements.  No bowel or bladder incontinence.  Pain does not radiate.   Primary located in left lower back.      Objective:  Physical Exam: BP 130/78 Comment: takien at home this morning  Pulse 66   Temp 97.7 F (36.5 C)   Ht 5\' 2"  (1.575 m)   Wt 183 lb (83 kg)   SpO2 97%   BMI 33.47 kg/m   Gen: No acute distress, resting comfortably CV: Regular rate and rhythm with no murmurs appreciated Pulm: Normal work of breathing, clear to auscultation bilaterally with no crackles, wheezes, or rhonchi MUSCULOSKELETAL: - Back: No deformities. Tender nodules along left lower lumbar paraspinal muscle groups.  Neurovascular intact distally. Neuro: Grossly normal, moves all extremities Psych: Normal affect and thought content      Jerlyn Pain M. Jimmey Ralph, MD 12/02/2023 11:17 AM

## 2023-12-02 NOTE — Assessment & Plan Note (Signed)
Having more urinary hesitancy symptoms.  Will increase his Flomax to 0.8 mg daily.  His PSA a few months ago was normal.  Checking UA and urine culture as above.  May need referral to urology if symptoms persist despite above.

## 2023-12-03 LAB — URINE CULTURE
MICRO NUMBER:: 16032838
SPECIMEN QUALITY:: ADEQUATE

## 2023-12-04 ENCOUNTER — Encounter: Payer: Self-pay | Admitting: Family Medicine

## 2023-12-04 NOTE — Progress Notes (Signed)
His urine culture is negative.  As we discussed at his office visit recommend referral to urology if he is still having urinary issues.  Please place referral if needed.  He can continue with Flomax or 0.8 mg daily.  He should let us know if his back pain is not improving.

## 2023-12-04 NOTE — Telephone Encounter (Signed)
 See note

## 2023-12-04 NOTE — Telephone Encounter (Signed)
 The mucus is a normal finding. The crystal may indicate that he can be predisposed to kidney stones but he did not have any blood in his urine making a kidney stone much less likely.   Jinny Mounts. Daneil Dunker, MD 12/04/2023 11:05 AM

## 2023-12-28 ENCOUNTER — Other Ambulatory Visit: Payer: Self-pay | Admitting: Nurse Practitioner

## 2024-01-29 ENCOUNTER — Telehealth: Payer: Self-pay | Admitting: *Deleted

## 2024-01-29 NOTE — Telephone Encounter (Signed)
 1 year call completed for Left total knee replacement.

## 2024-02-13 ENCOUNTER — Encounter (HOSPITAL_BASED_OUTPATIENT_CLINIC_OR_DEPARTMENT_OTHER): Payer: Self-pay | Admitting: Student

## 2024-02-13 ENCOUNTER — Ambulatory Visit
Admission: EM | Admit: 2024-02-13 | Discharge: 2024-02-13 | Disposition: A | Discharge status: Home / Self Care | Attending: Physician Assistant | Admitting: Physician Assistant

## 2024-02-13 ENCOUNTER — Ambulatory Visit (HOSPITAL_BASED_OUTPATIENT_CLINIC_OR_DEPARTMENT_OTHER): Admitting: Student

## 2024-02-13 ENCOUNTER — Ambulatory Visit (INDEPENDENT_AMBULATORY_CARE_PROVIDER_SITE_OTHER)

## 2024-02-13 DIAGNOSIS — S62326A Displaced fracture of shaft of fifth metacarpal bone, right hand, initial encounter for closed fracture: Secondary | ICD-10-CM

## 2024-02-13 DIAGNOSIS — M1811 Unilateral primary osteoarthritis of first carpometacarpal joint, right hand: Secondary | ICD-10-CM | POA: Diagnosis not present

## 2024-02-13 DIAGNOSIS — S62316A Displaced fracture of base of fifth metacarpal bone, right hand, initial encounter for closed fracture: Secondary | ICD-10-CM | POA: Diagnosis not present

## 2024-02-13 NOTE — ED Triage Notes (Signed)
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 DOI "over the weekend/last".

## 2024-02-13 NOTE — Discharge Instructions (Signed)
       [facility]  Orthopedic Urgent Care    Appointments: [email or web address]  Location #1 [facility] [address] [place] [address] [place], [place], [address] [phone] Hours: M-F 8a-8p              Saturday 9am-2pm              Sunday 10am-2pm  Location #2 [facility] [address] [place], [place], [address] [phone] Hours: Call       Scheduling Note  We encourage you to schedule online or to call [phone] to schedule a same-day appointment. If you choose to walk-in for care, please be aware our staff may be away from the offer from 11:30 to 12:30 daily.  Hours: M-F 9-5pm      VISIT OUR [facility] is an after hours walk-in clinic developed to treat acute bone and joint injuries, providing you the best quality care, right from the start.  [facility] gives you fast attention from our expert orthopaedic providers and sports medicine specialists with access to the most up-to-date technology, treatments, and practices in caring for musculoskeletal injuries. By giving you an alternative to emergency rooms and general urgent care facilities, [facility] reduces your cost, provides better treatment, and gives faster care for patients with acute orthopaedic injuries.  ADDRESS & HOURS  ADDRESS [address] [place], [place] [address] Phone: [phone] HOURS Monday - Friday 5:30 PM to 9:00 PM  Saturday 9:00 AM to 2:00 PM Sunday 10:00 AM to 2:00 PM

## 2024-02-13 NOTE — Progress Notes (Signed)
 Chief Complaint: Right hand fracture     History of Present Illness:    Frank [name] is a 73 y.o. left-hand-dominant male presenting today for evaluation of fracture in his right hand.  Patient reports that approximately 4 days ago he was tapping on a piece of wooden furniture with the outside of his hand to get it back into place and felt a sudden pain.  Pain continued over the next few days prompting evaluation this morning in urgent care.  He was placed into a splint after x-rays demonstrated a fifth metacarpal fracture.  States that overall pain is mild.  Denies any numbness or tingling.   Surgical History:   None  PMH/PSH/Family History/Social History/Meds/Allergies:    Past Medical History:  Diagnosis Date   Allergy    Asthma    Coronary artery disease    GERD 01/30/2008   Qualifier: Diagnosis of   By: Tawanna Cooler MD, Tinnie Gens A        GERD (gastroesophageal reflux disease)    Headache 01/30/2008   Qualifier: Diagnosis of   By: Tawanna Cooler MD, Tinnie Gens A     Replacing diagnoses that were inactivated after the 01/28/23 regulatory import     Headache(784.0)    History of kidney stones    Hypertension    Neck pain 02/15/2017   Obese 08/17/2015   Osteoarthritis of knee    right   Past Surgical History:  Procedure Laterality Date   CARDIAC CATHETERIZATION     HERNIA REPAIR  1969, 2009   kidney stones  05/20/1995   KNEE SURGERY  02/26/1989   MITRAL VALVE REPAIR  01/02/2022   NASAL SINUS SURGERY  10/24/1992   RIGHT/LEFT HEART CATH AND CORONARY ANGIOGRAPHY N/A 11/15/2021   Procedure: RIGHT/LEFT HEART CATH AND CORONARY ANGIOGRAPHY;  Surgeon: Orbie Pyo, MD;  Location: [facility] INVASIVE CV LAB;  Service: Cardiovascular;  Laterality: N/A;   ROTATOR CUFF REPAIR Left 04/25/1993   TEE WITHOUT CARDIOVERSION N/A 08/24/2021   Procedure: TRANSESOPHAGEAL ECHOCARDIOGRAM (TEE);  Surgeon: Jodelle Red, MD;  Location[facility] ENDOSCOPY;  Service: Cardiovascular;   Laterality: N/A;   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   Procedure: LEFT TOTAL KNEE ARTHROPLASTY;  Surgeon: Cammy Copa, MD;  Location: [facility] OR;  Service: Orthopedics;  Laterality: Left;   TOTAL KNEE ARTHROPLASTY Right 04/11/2023   Procedure: RIGHT TOTAL KNEE ARTHROPLASTY-CEMENTED;  Surgeon: Cammy Copa, MD;  Location: [facility] OR;  Service: Orthopedics;  Laterality: Right;   Social History   Socioeconomic History   Marital status: Married    Spouse name: Not on file   Number of children: 2   Years of education: 11   Highest education level: 11th grade  Occupational History   Not on file  Tobacco Use   Smoking status: Former    Current packs/day: 0.00    Types: Cigarettes    Quit date: 1972    Years since quitting: 53.3   Smokeless tobacco: Never   Tobacco comments:    quit in 1972  Vaping Use   Vaping status: Never Used  Substance and Sexual Activity   Alcohol use: Not Currently   Drug use: Never   Sexual activity: Not Currently  Other Topics Concern   Not on file  Social History Narrative   Not on file   Social Drivers of Health  Financial Resource Strain: Low Risk  (11/19/2023)   Overall Financial Resource Strain (CARDIA)    Difficulty of Paying Living Expenses: Not hard at all  Food Insecurity: No Food Insecurity (11/19/2023)   Hunger Vital Sign    Worried About Running Out of Food in the Last Year: Never true    Ran Out of Food in the Last Year: Never true  Transportation Needs: No Transportation Needs (11/19/2023)   PRAPARE - Administrator, Civil Service (Medical): No    Lack of Transportation (Non-Medical): No  Physical Activity: Sufficiently Active (11/19/2023)   Exercise Vital Sign    Days of Exercise per Week: 5 days    Minutes of Exercise per Session: 30 min  Stress: No Stress Concern Present (11/19/2023)   [employer] of Occupational Health - Occupational Stress Questionnaire    Feeling of Stress : Not at all  Social  Connections: Moderately Isolated (11/19/2023)   Social Connection and Isolation Panel [NHANES]    Frequency of Communication with Friends and Family: More than three times a week    Frequency of Social Gatherings with Friends and Family: More than three times a week    Attends Religious Services: Never    Database administrator or Organizations: No    Attends Engineer, structural: Never    Marital Status: Married   Family History  Problem Relation Age of Onset   Kidney disease Mother    COPD Father    Pneumonia Father    Cardiomyopathy Brother    Hypertension Sister    Heart disease Brother    Hypertension Brother    Hypertension Brother    Hypertension Sister    Diabetes Sister    Osteoarthritis Sister    Allergies  Allergen Reactions   Meloxicam Hives and Shortness Of Breath   Atorvastatin Other (See Comments)    Joint pain   Metoprolol Rash    Weakness / fatigue    Tomato     Mouth breaks out   Current Outpatient Medications  Medication Sig Dispense Refill   acetaminophen (TYLENOL) 325 MG tablet Take 1-2 tablets (325-650 mg total) by mouth every 6 (six) hours as needed for mild pain (pain score 1-3 or temp > 100.5). 60 tablet 0   ascorbic acid (VITAMIN C) 500 MG tablet Take 500 mg by mouth daily.     aspirin 81 MG chewable tablet Chew 1 tablet (81 mg total) by mouth 2 (two) times daily. (Patient taking differently: Chew 81 mg by mouth daily.) 60 tablet 0   cholecalciferol (VITAMIN D) 25 MCG (1000 UNIT) tablet Take 1,000 Units by mouth daily.     cyclobenzaprine (FLEXERIL) 10 MG tablet Take 1 tablet (10 mg total) by mouth 3 (three) times daily as needed for muscle spasms. 30 tablet 0   fluticasone (FLONASE) 50 MCG/ACT nasal spray Place 1 spray into both nostrils daily as needed for allergies or rhinitis.     furosemide (LASIX) 20 MG tablet TAKE 1 TABLET TWICE A DAY MON/WED/FRI ONLY AS DIRECTED 90 tablet 3   Potassium Chloride ER 20 MEQ TBCR Take 0.5 tablets (10 mEq  total) by mouth 3 (three) times a week. Mon/Wed/Fri 45 tablet 1   tamsulosin (FLOMAX) 0.4 MG CAPS capsule TAKE 1 CAPSULE BY MOUTH EVERY DAY 90 capsule 1   telmisartan (MICARDIS) 20 MG tablet Take 1 tablet (20 mg total) by mouth daily. 90 tablet 3   triamcinolone (KENALOG) 0.1 % paste Apply to affected area  1-2 times daily (Patient taking differently: 1 application  daily as needed (mouth sores).) 5 g 12   vitamin B-12 (CYANOCOBALAMIN) 1000 MCG tablet Take 1,000 mcg by mouth daily.     Zinc 50 MG CAPS Take 50 mg by mouth daily.     No current facility-administered medications for this visit.   DG Hand Complete Right Result Date: 02/13/2024 CLINICAL DATA:  Pain after injury. Swelling and decreased range of motion. EXAM: RIGHT HAND - COMPLETE 3+ VIEW COMPARISON:  None Available. FINDINGS: Mildly displaced and angulated proximal fifth metacarpal shaft fracture. No intra-articular involvement. No additional fracture. No dislocation. 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 No erosive change. Soft tissue edema is seen at the fracture site. IMPRESSION: 1. Mildly displaced and angulated proximal fifth metacarpal shaft fracture. 2. 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 Electronically Signed   By: Chadwick Colonel M.D.   On: 02/13/2024 09:06    Review of Systems:   A ROS was performed including pertinent positives and negatives as documented in the HPI.  Physical Exam :   Constitutional: NAD and appears stated age Neurological: Alert and oriented Psych: Appropriate affect and cooperative There were no vitals taken for this visit.   Comprehensive Musculoskeletal Exam:    Tenderness to palpation directly over the base of the fifth metacarpal without any palpable deformity.  Full active ROM at the wrist without discomfort.  Patient is able to form a fist with 5/5 strength.  Radial pulse 2+.  Distal neurosensory exam intact.  Imaging:    Xray review from urgent care today (right hand 3 views): Fracture at the proximal fifth metacarpal shaft with mild angulation.  Advanced first [facility] arthritis.   I personally reviewed and interpreted the radiographs.   Assessment:   73 y.o. male with a mildly displaced fracture of the fifth metacarpal shaft of the right hand.  He is left-hand dominant.  Given the appearance on x-rays as well as physical exam, I do believe that nonoperative treatment is indicated with immobilization.  I did discuss advantages and disadvantages of casting versus a removable brace.  After discussion and consideration, patient would ultimately like to proceed with the removable ulnar gutter brace.  Advised consistent use of this but can remove for hygiene.  I would also not like him to perform any heavy lifting.  I will plan to see him back in another 4 weeks to repeat x-ray and assess healing.  Plan :    - Return to clinic in 4 weeks for reassessment and repeat x-ray     I personally saw and evaluated the patient, and participated in the management and treatment plan.  Sharrell Deck, PA-C Orthopedics

## 2024-02-16 NOTE — ED Provider Notes (Signed)
 [facility]    CSN: [id number] Arrival date & time: 02/13/24  0831      History   Chief Complaint Chief Complaint  Patient presents with   Hand Pain    HPI Frank [name] is a 73 y.o. male.   Patient here to a for evaluation of right hand swelling and pain that started after he hit his hand a few days ago.  He reports that he has had more swelling and decreased range of motion.  He denies any numbness or tingling.  The history is provided by the patient.  Hand Pain Pertinent negatives include no shortness of breath.    Past Medical History:  Diagnosis Date   Allergy    Asthma    Coronary artery disease    GERD 01/30/2008   Qualifier: Diagnosis of   By: Ena Harries MD, Susana Enter A        GERD (gastroesophageal reflux disease)    Headache 01/30/2008   Qualifier: Diagnosis of   By: Ena Harries MD, Susana Enter A     Replacing diagnoses that were inactivated after the 01/28/23 regulatory import     Headache(784.0)    History of kidney stones    Hypertension    Neck pain 02/15/2017   Obese 08/17/2015   Osteoarthritis of knee    right    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   Arthritis of right knee 12/18/2022   Nonrheumatic mitral valve regurgitation s/p repair 2023    Peroneal tendinitis of right lower extremity 04/25/2021   Benign localized prostatic hyperplasia with lower urinary tract symptoms (LUTS) 09/27/2020   Wheezing 09/14/2020   Chondrocalcinosis 03/14/2020   Dyslipidemia 09/23/2017   Bilateral primary osteoarthritis of knee 09/02/2017   Primary osteoarthritis of right hip 02/15/2017   Tear of right acetabular labrum 02/15/2017   Right leg pain 01/04/2017   Elevated blood sugar 08/17/2015   Shingles 09/10/2014   Dyspnea 11/27/2011   HERNIA, UMBILICAL 08/02/2008   Essential hypertension 01/30/2008   Allergic rhinitis 01/30/2008    Past Surgical History:  Procedure Laterality Date   CARDIAC  CATHETERIZATION     HERNIA REPAIR  1969, 2009   kidney stones  05/20/1995   KNEE SURGERY  02/26/1989   MITRAL VALVE REPAIR  01/02/2022   NASAL SINUS SURGERY  10/24/1992   RIGHT/LEFT HEART CATH AND CORONARY ANGIOGRAPHY N/A 11/15/2021   Procedure: RIGHT/LEFT HEART CATH AND CORONARY ANGIOGRAPHY;  Surgeon: Kyra Phy, MD;  [facility];  Service: Cardiovascular;  Laterality: N/A;   ROTATOR CUFF REPAIR Left 04/25/1993   TEE WITHOUT CARDIOVERSION N/A 08/24/2021   Procedure: TRANSESOPHAGEAL ECHOCARDIOGRAM (TEE);  Surgeon: Sheryle Donning, MD;  Location: [facility] ENDOSCOPY;  Service: Cardiovascular;  Laterality: N/A;   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   Procedure: LEFT TOTAL KNEE ARTHROPLASTY;  Surgeon: Jasmine Mesi, MD;  Location: [facility] OR;  Service: Orthopedics;  Laterality: Left;   TOTAL KNEE ARTHROPLASTY Right 04/11/2023   Procedure: RIGHT TOTAL KNEE ARTHROPLASTY-CEMENTED;  Surgeon: Jasmine Mesi, MD;  Location: [facility] OR;  Service: Orthopedics;  Laterality: Right;       Home Medications    Prior to Admission medications   Medication Sig Start Date End Date Taking? Authorizing Provider  acetaminophen  (TYLENOL ) 325 MG tablet Take 1-2 tablets (325-650 mg total) by mouth every 6 (six) hours as needed for mild pain (pain score 1-3 or temp > 100.5). 12/19/22   Zandra Hew  Geralyn Knee, MD  ascorbic acid  (VITAMIN C ) 500 MG tablet Take 500 mg by mouth daily.    [provider]  aspirin  81 MG chewable tablet Chew 1 tablet (81 mg total) by mouth 2 (two) times daily. Patient taking differently: Chew 81 mg by mouth daily. 04/12/23   Magnant, Idris L, PA-C  cholecalciferol  (VITAMIN D ) 25 MCG (1000 UNIT) tablet Take 1,000 Units by mouth daily.    [provider]  cyclobenzaprine  (FLEXERIL ) 10 MG tablet Take 1 tablet (10 mg total) by mouth 3 (three) times daily as needed for muscle spasms. 12/02/23   Rodney Clamp, MD  fluticasone  (FLONASE ) 50 MCG/ACT nasal  spray Place 1 spray into both nostrils daily as needed for allergies or rhinitis.    [provider]  furosemide  (LASIX ) 20 MG tablet TAKE 1 TABLET TWICE A DAY MON/WED/FRI ONLY AS DIRECTED 12/30/23   Gerald Kitty., NP  Potassium Chloride  ER 20 MEQ TBCR Take 0.5 tablets (10 mEq total) by mouth 3 (three) times a week. Mon/Wed/Fri 07/31/23   Gerald Kitty., NP  tamsulosin  (FLOMAX ) 0.4 MG CAPS capsule TAKE 1 CAPSULE BY MOUTH EVERY DAY 11/05/23   Rodney Clamp, MD  telmisartan  (MICARDIS ) 20 MG tablet Take 1 tablet (20 mg total) by mouth daily. 10/09/23   Thukkani, Arun K, MD  triamcinolone  (KENALOG ) 0.1 % paste Apply to affected area 1-2 times daily Patient taking differently: 1 application  daily as needed (mouth sores). 06/23/21   Alexander Iba, PA  vitamin B-12 (CYANOCOBALAMIN ) 1000 MCG tablet Take 1,000 mcg by mouth daily.    [provider]  Zinc  50 MG CAPS Take 50 mg by mouth daily.    [provider]    Family History Family History  Problem Relation Age of Onset   Kidney disease Mother    COPD Father    Pneumonia Father    Cardiomyopathy Brother    Hypertension Sister    Heart disease Brother    Hypertension Brother    Hypertension Brother    Hypertension Sister    Diabetes Sister    Osteoarthritis Sister     Social History Social History   Tobacco Use   Smoking status: Former    Current packs/day: 0.00    Types: Cigarettes    Quit date: 1972    Years since quitting: 53.3   Smokeless tobacco: Never   Tobacco comments:    quit in 1972  Vaping Use   Vaping status: Never Used  Substance Use Topics   Alcohol use: Not Currently   Drug use: Never     Allergies   Meloxicam , Atorvastatin , Metoprolol , and Tomato   Review of Systems Review of Systems  Constitutional:  Negative for chills and fever.  Eyes:  Negative for discharge and redness.  Respiratory:  Negative for shortness of breath.   Musculoskeletal:  Positive for  arthralgias. Negative for joint swelling.  Skin:  Negative for color change and wound.  Neurological:  Negative for numbness.     Physical Exam Triage Vital Signs ED Triage Vitals  Encounter Vitals Group     BP 02/13/24 0844 (!) 153/78     Systolic BP Percentile --      Diastolic BP Percentile --      Pulse Rate 02/13/24 0844 78     Resp 02/13/24 0844 18     Temp 02/13/24 0844 97.9 F (36.6 C)     Temp Source 02/13/24 0844 Oral  SpO2 02/13/24 0844 98 %     Weight 02/13/24 0842 182 lb 15.7 oz (83 kg)     Height 02/13/24 0842 5\' 2"  (1.575 m)     Head Circumference --      Peak Flow --      Pain Score 02/13/24 0839 3     Pain Loc --      Pain Education --      Exclude from Growth Chart --    No data found.  Updated Vital Signs BP (!) 153/78 (BP Location: Left Arm)   Pulse 78   Temp 97.9 F (36.6 C) (Oral)   Resp 18   Ht 5\' 2"  (1.575 m)   Wt 182 lb 15.7 oz (83 kg)   SpO2 98%   BMI 33.47 kg/m   Visual Acuity Right Eye Distance:   Left Eye Distance:   Bilateral Distance:    Right Eye Near:   Left Eye Near:    Bilateral Near:     Physical Exam Vitals and nursing note reviewed.  Constitutional:      General: He is not in acute distress.    Appearance: Normal appearance. He is not ill-appearing.  HENT:     Head: Normocephalic and atraumatic.  Eyes:     Conjunctiva/sclera: Conjunctivae normal.  Cardiovascular:     Rate and Rhythm: Normal rate.  Pulmonary:     Effort: Pulmonary effort is normal. No respiratory distress.  Musculoskeletal:     Comments: Diffuse swelling appreciated to right fifth metacarpal area, normal ROM of right fingers and wrist  Neurological:     Mental Status: He is alert.  Psychiatric:        Mood and Affect: Mood normal.        Behavior: Behavior normal.        Thought Content: Thought content normal.      UC Treatments / Results  Labs (all labs ordered are listed, but only abnormal results are displayed) Labs Reviewed -  No data to display  EKG   Radiology No results found.  Procedures Procedures (including critical care time)  Medications Ordered in UC Medications - No data to display  Initial Impression / Assessment and Plan / UC Course  I have reviewed the triage vital signs and the nursing notes.  Pertinent labs & imaging results that were available during my care of the patient were reviewed by me and considered in my medical decision making (see chart for details).    Mildly displaced fracture noted on x-ray. Ulnar gutter splint applied in office and recommended follow-up with Ortho today if possible.  Patient is agreeable with same.  Final Clinical Impressions(s) / UC Diagnoses   Final diagnoses:  Closed displaced fracture of shaft of fifth metacarpal bone of right hand, initial encounter     Discharge Instructions            [facility]  [facility]    Appointments: [email or web address]  Location #1 [facility] [address] [place], [place], [address] [phone] Hours: M-F 8a-8p              Saturday 9am-2pm              Sunday 10am-2pm  Location #2 [facility] [address] [place], [place], [address] [phone] Hours: Call       Scheduling Note  We encourage you to schedule online or to call [phone] to schedule a same-day appointment. If you  choose to walk-in for care, please be aware our staff may be away from the offer from 11:30 to 12:30 daily.  Hours: M-F 9-5pm      VISIT OUR [facility] is an after hours walk-in clinic developed to treat acute bone and joint injuries, providing you the best quality care, right from the start.  [facility] gives you fast attention from our expert orthopaedic providers and sports medicine specialists with access to the most up-to-date technology, treatments, and practices in caring  for musculoskeletal injuries. By giving you an alternative to emergency rooms and general urgent care facilities, [facility] reduces your cost, provides better treatment, and gives faster care for patients with acute orthopaedic injuries.  ADDRESS & HOURS  ADDRESS [address] [place], [place] [address] Phone: [phone] HOURS Monday - Friday 5:30 PM to 9:00 PM  Saturday 9:00 AM to 2:00 PM Sunday 10:00 AM to 2:00 PM        ED Prescriptions   None    PDMP not reviewed this encounter.   Vernestine Gondola, PA-C 02/16/24 1408

## 2024-03-09 ENCOUNTER — Ambulatory Visit (HOSPITAL_BASED_OUTPATIENT_CLINIC_OR_DEPARTMENT_OTHER)

## 2024-03-09 ENCOUNTER — Ambulatory Visit (HOSPITAL_BASED_OUTPATIENT_CLINIC_OR_DEPARTMENT_OTHER): Admitting: Student

## 2024-03-09 ENCOUNTER — Encounter (HOSPITAL_BASED_OUTPATIENT_CLINIC_OR_DEPARTMENT_OTHER): Payer: Self-pay | Admitting: Student

## 2024-03-09 DIAGNOSIS — M25551 Pain in right hip: Secondary | ICD-10-CM

## 2024-03-09 DIAGNOSIS — S62326A Displaced fracture of shaft of fifth metacarpal bone, right hand, initial encounter for closed fracture: Secondary | ICD-10-CM | POA: Diagnosis not present

## 2024-03-09 DIAGNOSIS — M1811 Unilateral primary osteoarthritis of first carpometacarpal joint, right hand: Secondary | ICD-10-CM | POA: Diagnosis not present

## 2024-03-09 DIAGNOSIS — S62316D Displaced fracture of base of fifth metacarpal bone, right hand, subsequent encounter for fracture with routine healing: Secondary | ICD-10-CM | POA: Diagnosis not present

## 2024-03-09 DIAGNOSIS — M160 Bilateral primary osteoarthritis of hip: Secondary | ICD-10-CM | POA: Diagnosis not present

## 2024-03-09 NOTE — Progress Notes (Signed)
 Chief Complaint: Right hand fracture and right hip pain     History of Present Illness:   03/09/24: Patient presents today for 4-week follow-up of a right fifth metacarpal shaft fracture as well as posterior right hip pain.  He has been wearing the ulnar gutter brace and is tolerating this well.  States that he has intermittent pain still in the right hand, however this has significantly improved.  He also reports some ongoing right posterior hip pain that began a few weeks ago.  This is located mainly in the right buttock area and does not radiate into the right leg.  He does have history of a labral tear of the right hip demonstrated on an MRI back in 2018, although denies any groin pain.  This was likely attributed due to a fall in 2017 while stepping off the tire of his truck.  Denies any numbness or tingling.   02/13/24: Frank [name] is a 73 y.o. left-hand-dominant male presenting today for evaluation of fracture in his right hand.  Patient reports that approximately 4 days ago he was tapping on a piece of wooden furniture with the outside of his hand to get it back into place and felt a sudden pain.  Pain continued over the next few days prompting evaluation this morning in urgent care.  He was placed into a splint after x-rays demonstrated a fifth metacarpal fracture.  States that overall pain is mild.  Denies any numbness or tingling.   Surgical History:   None  PMH/PSH/Family History/Social History/Meds/Allergies:    Past Medical History:  Diagnosis Date   Allergy    Asthma    Coronary artery disease    GERD 01/30/2008   Qualifier: Diagnosis of   By: Ena Harries MD, Susana Enter A        GERD (gastroesophageal reflux disease)    Headache 01/30/2008   Qualifier: Diagnosis of   By: Ena Harries MD, Susana Enter A     Replacing diagnoses that were inactivated after the 01/28/23 regulatory import     Headache(784.0)    History of kidney stones    Hypertension    Neck  pain 02/15/2017   Obese 08/17/2015   Osteoarthritis of knee    right   Past Surgical History:  Procedure Laterality Date   CARDIAC CATHETERIZATION     HERNIA REPAIR  1969, 2009   kidney stones  05/20/1995   KNEE SURGERY  02/26/1989   MITRAL VALVE REPAIR  01/02/2022   NASAL SINUS SURGERY  10/24/1992   RIGHT/LEFT HEART CATH AND CORONARY ANGIOGRAPHY N/A 11/15/2021   Procedure: RIGHT/LEFT HEART CATH AND CORONARY ANGIOGRAPHY;  Surgeon: Kyra Phy, MD;  [facility];  Service: Cardiovascular;  Laterality: N/A;   ROTATOR CUFF REPAIR Left 04/25/1993   TEE WITHOUT CARDIOVERSION N/A 08/24/2021   Procedure: TRANSESOPHAGEAL ECHOCARDIOGRAM (TEE);  Surgeon: Sheryle Donning, MD;  Location: [facility] ENDOSCOPY;  Service: Cardiovascular;  Laterality: N/A;   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   Procedure: LEFT TOTAL KNEE ARTHROPLASTY;  Surgeon: Jasmine Mesi, MD;  Location: [facility] OR;  Service: Orthopedics;  Laterality: Left;   TOTAL KNEE ARTHROPLASTY Right 04/11/2023   Procedure: RIGHT TOTAL KNEE ARTHROPLASTY-CEMENTED;  Surgeon: Jasmine Mesi, MD;  Location: [facility] OR;  Service: Orthopedics;  Laterality: Right;   Social History   Socioeconomic History  Marital status: Married    Spouse name: Not on file   Number of children: 2   Years of education: 50   Highest education level: 11th grade  Occupational History   Not on file  Tobacco Use   Smoking status: Former    Current packs/day: 0.00    Types: Cigarettes    Quit date: 83    Years since quitting: 53.3   Smokeless tobacco: Never   Tobacco comments:    quit in 1972  Vaping Use   Vaping status: Never Used  Substance and Sexual Activity   Alcohol use: Not Currently   Drug use: Never   Sexual activity: Not Currently  Other Topics Concern   Not on file  Social History Narrative   Not on file   Social Drivers of Health   Financial Resource Strain: Low Risk  (11/19/2023)   Overall Financial Resource  Strain (CARDIA)    Difficulty of Paying Living Expenses: Not hard at all  Food Insecurity: No Food Insecurity (11/19/2023)   Hunger Vital Sign    Worried About Running Out of Food in the Last Year: Never true    Ran Out of Food in the Last Year: Never true  Transportation Needs: No Transportation Needs (11/19/2023)   PRAPARE - Administrator, Civil Service (Medical): No    Lack of Transportation (Non-Medical): No  Physical Activity: Sufficiently Active (11/19/2023)   Exercise Vital Sign    Days of Exercise per Week: 5 days    Minutes of Exercise per Session: 30 min  Stress: No Stress Concern Present (11/19/2023)   [employer] of Occupational Health - Occupational Stress Questionnaire    Feeling of Stress : Not at all  Social Connections: Moderately Isolated (11/19/2023)   Social Connection and Isolation Panel [NHANES]    Frequency of Communication with Friends and Family: More than three times a week    Frequency of Social Gatherings with Friends and Family: More than three times a week    Attends Religious Services: Never    Database administrator or Organizations: No    Attends Engineer, structural: Never    Marital Status: Married   Family History  Problem Relation Age of Onset   Kidney disease Mother    COPD Father    Pneumonia Father    Cardiomyopathy Brother    Hypertension Sister    Heart disease Brother    Hypertension Brother    Hypertension Brother    Hypertension Sister    Diabetes Sister    Osteoarthritis Sister    Allergies  Allergen Reactions   Meloxicam  Hives and Shortness Of Breath   Atorvastatin  Other (See Comments)    Joint pain   Metoprolol  Rash    Weakness / fatigue    Tomato     Mouth breaks out   Current Outpatient Medications  Medication Sig Dispense Refill   acetaminophen  (TYLENOL ) 325 MG tablet Take 1-2 tablets (325-650 mg total) by mouth every 6 (six) hours as needed for mild pain (pain score 1-3 or temp > 100.5). 60  tablet 0   ascorbic acid  (VITAMIN C ) 500 MG tablet Take 500 mg by mouth daily.     aspirin  81 MG chewable tablet Chew 1 tablet (81 mg total) by mouth 2 (two) times daily. (Patient taking differently: Chew 81 mg by mouth daily.) 60 tablet 0   cholecalciferol  (VITAMIN D ) 25 MCG (1000 UNIT) tablet Take 1,000 Units by mouth daily.  cyclobenzaprine  (FLEXERIL ) 10 MG tablet Take 1 tablet (10 mg total) by mouth 3 (three) times daily as needed for muscle spasms. 30 tablet 0   fluticasone  (FLONASE ) 50 MCG/ACT nasal spray Place 1 spray into both nostrils daily as needed for allergies or rhinitis.     furosemide  (LASIX ) 20 MG tablet TAKE 1 TABLET TWICE A DAY MON/WED/FRI ONLY AS DIRECTED 90 tablet 3   Potassium Chloride  ER 20 MEQ TBCR Take 0.5 tablets (10 mEq total) by mouth 3 (three) times a week. Mon/Wed/Fri 45 tablet 1   tamsulosin  (FLOMAX ) 0.4 MG CAPS capsule TAKE 1 CAPSULE BY MOUTH EVERY DAY 90 capsule 1   telmisartan  (MICARDIS ) 20 MG tablet Take 1 tablet (20 mg total) by mouth daily. 90 tablet 3   triamcinolone  (KENALOG ) 0.1 % paste Apply to affected area 1-2 times daily (Patient taking differently: 1 application  daily as needed (mouth sores).) 5 g 12   vitamin B-12 (CYANOCOBALAMIN ) 1000 MCG tablet Take 1,000 mcg by mouth daily.     Zinc  50 MG CAPS Take 50 mg by mouth daily.     No current facility-administered medications for this visit.   No results found.   Review of Systems:   A ROS was performed including pertinent positives and negatives as documented in the HPI.  Physical Exam :   Constitutional: NAD and appears stated age Neurological: Alert and oriented Psych: Appropriate affect and cooperative There were no vitals taken for this visit.   Comprehensive Musculoskeletal Exam:    Mild tenderness with palpation over the proximal fifth metacarpal of the right hand.  Fifth finger lacks a few degrees of flexion when forming a composite fist although patient retains good grip strength.   Full right wrist ROM.  Radial pulse 2+. No significant tenderness over the lumbar spine or lumbar paraspinal musculature.  Passive right hip ROM to 120 degrees flexion, 30 degrees ER, and 10 degrees IR.  Negative Faber and straight leg raise.  Tenderness in the posterior hip about the ischial tuberosity.  Knee flexion and extension strength is 5/5.  Imaging:   Xray (right hand 3 views): Mildly displaced fracture of the proximal fifth metacarpal shaft and metaphysis with evidence of increased surrounding callus formation  Xray (AP pelvis, right hip 3 views): Mild to moderate degenerative changes bilateral hips without evidence of acute abnormality.   I personally reviewed and interpreted the radiographs.   Assessment:   73 y.o. male 4 weeks status post mildly displaced fifth metacarpal fracture.  He has been compliant with ulnar gutter brace usage and x-rays today do demonstrate good evidence of callus formation.  I have recommended 2 more weeks with brace usage, at which point he can begin to wean out for lower level activities.  I will plan to see him back in 3 weeks for this to repeat x-rays and ensure continued healing.  He is also experiencing some posterior hip pain at the ischial tuberosity suggestive of ischial bursitis versus proximal hamstring tendinopathy.  Overall this pain is mild and not extremely impactful on daily activities.  He does have history of a labral tear in this hip, although this does not appear to be symptomatic at this time.  Will plan to monitor this and would likely consider referral to physical therapy should symptoms persist or worsen.  Plan :    - Return to clinic in 3 weeks for reassessment and repeat x-ray     I personally saw and evaluated the patient, and participated in the  management and treatment plan.  Sharrell Deck, PA-C Orthopedics

## 2024-03-30 ENCOUNTER — Ambulatory Visit (HOSPITAL_BASED_OUTPATIENT_CLINIC_OR_DEPARTMENT_OTHER): Admitting: Student

## 2024-03-30 ENCOUNTER — Ambulatory Visit (HOSPITAL_BASED_OUTPATIENT_CLINIC_OR_DEPARTMENT_OTHER)

## 2024-03-30 DIAGNOSIS — S62326A Displaced fracture of shaft of fifth metacarpal bone, right hand, initial encounter for closed fracture: Secondary | ICD-10-CM

## 2024-03-30 DIAGNOSIS — M19041 Primary osteoarthritis, right hand: Secondary | ICD-10-CM | POA: Diagnosis not present

## 2024-03-30 DIAGNOSIS — M1811 Unilateral primary osteoarthritis of first carpometacarpal joint, right hand: Secondary | ICD-10-CM | POA: Diagnosis not present

## 2024-03-30 DIAGNOSIS — S62316A Displaced fracture of base of fifth metacarpal bone, right hand, initial encounter for closed fracture: Secondary | ICD-10-CM | POA: Diagnosis not present

## 2024-03-30 NOTE — Progress Notes (Signed)
 Chief Complaint: Right hand fracture     History of Present Illness:   03/30/24: Frank [name] presents to clinic today for follow-up evaluation of a right fifth metacarpal shaft fracture.  The injury occurred approximately 7 weeks ago.  Today he reports having no pain in the hand and is regaining his grip strength.  He has been able to come out of the brace for low-level activity and has tolerated this well.  He denies any further concerns.    03/09/24: Patient presents today for 4-week follow-up of a right fifth metacarpal shaft fracture as well as posterior right hip pain.  He has been wearing the ulnar gutter brace and is tolerating this well.  States that he has intermittent pain still in the right hand, however this has significantly improved.  He also reports some ongoing right posterior hip pain that began a few weeks ago.  This is located mainly in the right buttock area and does not radiate into the right leg.  He does have history of a labral tear of the right hip demonstrated on an MRI back in 2018, although denies any groin pain.  This was likely attributed due to a fall in 2017 while stepping off the tire of his truck.  Denies any numbness or tingling.   Surgical History:   None  PMH/PSH/Family History/Social History/Meds/Allergies:    Past Medical History:  Diagnosis Date   Allergy    Asthma    Coronary artery disease    GERD 01/30/2008   Qualifier: Diagnosis of   By: Ena Harries MD, Susana Enter A        GERD (gastroesophageal reflux disease)    Headache 01/30/2008   Qualifier: Diagnosis of   By: Ena Harries MD, Susana Enter A     Replacing diagnoses that were inactivated after the 01/28/23 regulatory import     Headache(784.0)    History of kidney stones    Hypertension    Neck pain 02/15/2017   Obese 08/17/2015   Osteoarthritis of knee    right   Past Surgical History:  Procedure Laterality Date   CARDIAC CATHETERIZATION     HERNIA REPAIR  1969, 2009    kidney stones  05/20/1995   KNEE SURGERY  02/26/1989   MITRAL VALVE REPAIR  01/02/2022   NASAL SINUS SURGERY  10/24/1992   RIGHT/LEFT HEART CATH AND CORONARY ANGIOGRAPHY N/A 11/15/2021   Procedure: RIGHT/LEFT HEART CATH AND CORONARY ANGIOGRAPHY;  Surgeon: Kyra Phy, MD;  Location: [facility];  Service: Cardiovascular;  Laterality: N/A;   ROTATOR CUFF REPAIR Left 04/25/1993   TEE WITHOUT CARDIOVERSION N/A 08/24/2021   Procedure: TRANSESOPHAGEAL ECHOCARDIOGRAM (TEE);  Surgeon: Sheryle Donning, MD;  Location: [facility] ENDOSCOPY;  Service: Cardiovascular;  Laterality: N/A;   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   Procedure: LEFT TOTAL KNEE ARTHROPLASTY;  Surgeon: Jasmine Mesi, MD;  Location: [facility] OR;  Service: Orthopedics;  Laterality: Left;   TOTAL KNEE ARTHROPLASTY Right 04/11/2023   Procedure: RIGHT TOTAL KNEE ARTHROPLASTY-CEMENTED;  Surgeon: Jasmine Mesi, MD;  Location: [facility] OR;  Service: Orthopedics;  Laterality: Right;   Social History   Socioeconomic History   Marital status: Married    Spouse name: Not on file   Number of children: 2   Years of education: 11   Highest education level: 11th grade  Occupational  History   Not on file  Tobacco Use   Smoking status: Former    Current packs/day: 0.00    Types: Cigarettes    Quit date: 36    Years since quitting: 53.4   Smokeless tobacco: Never   Tobacco comments:    quit in 1972  Vaping Use   Vaping status: Never Used  Substance and Sexual Activity   Alcohol use: Not Currently   Drug use: Never   Sexual activity: Not Currently  Other Topics Concern   Not on file  Social History Narrative   Not on file   Social Drivers of Health   Financial Resource Strain: Low Risk  (11/19/2023)   Overall Financial Resource Strain (CARDIA)    Difficulty of Paying Living Expenses: Not hard at all  Food Insecurity: No Food Insecurity (11/19/2023)   Hunger Vital Sign    Worried About Running Out of Food in the  Last Year: Never true    Ran Out of Food in the Last Year: Never true  Transportation Needs: No Transportation Needs (11/19/2023)   PRAPARE - Administrator, Civil Service (Medical): No    Lack of Transportation (Non-Medical): No  Physical Activity: Sufficiently Active (11/19/2023)   Exercise Vital Sign    Days of Exercise per Week: 5 days    Minutes of Exercise per Session: 30 min  Stress: No Stress Concern Present (11/19/2023)   [employer] of Occupational Health - Occupational Stress Questionnaire    Feeling of Stress : Not at all  Social Connections: Moderately Isolated (11/19/2023)   Social Connection and Isolation Panel [NHANES]    Frequency of Communication with Friends and Family: More than three times a week    Frequency of Social Gatherings with Friends and Family: More than three times a week    Attends Religious Services: Never    Database administrator or Organizations: No    Attends Engineer, structural: Never    Marital Status: Married   Family History  Problem Relation Age of Onset   Kidney disease Mother    COPD Father    Pneumonia Father    Cardiomyopathy Brother    Hypertension Sister    Heart disease Brother    Hypertension Brother    Hypertension Brother    Hypertension Sister    Diabetes Sister    Osteoarthritis Sister    Allergies  Allergen Reactions   Meloxicam  Hives and Shortness Of Breath   Atorvastatin  Other (See Comments)    Joint pain   Metoprolol  Rash    Weakness / fatigue    Tomato     Mouth breaks out   Current Outpatient Medications  Medication Sig Dispense Refill   acetaminophen  (TYLENOL ) 325 MG tablet Take 1-2 tablets (325-650 mg total) by mouth every 6 (six) hours as needed for mild pain (pain score 1-3 or temp > 100.5). 60 tablet 0   ascorbic acid  (VITAMIN C ) 500 MG tablet Take 500 mg by mouth daily.     aspirin  81 MG chewable tablet Chew 1 tablet (81 mg total) by mouth 2 (two) times daily. (Patient taking  differently: Chew 81 mg by mouth daily.) 60 tablet 0   cholecalciferol  (VITAMIN D ) 25 MCG (1000 UNIT) tablet Take 1,000 Units by mouth daily.     cyclobenzaprine  (FLEXERIL ) 10 MG tablet Take 1 tablet (10 mg total) by mouth 3 (three) times daily as needed for muscle spasms. 30 tablet 0   fluticasone  (FLONASE ) 50 MCG/ACT  nasal spray Place 1 spray into both nostrils daily as needed for allergies or rhinitis.     furosemide  (LASIX ) 20 MG tablet TAKE 1 TABLET TWICE A DAY MON/WED/FRI ONLY AS DIRECTED 90 tablet 3   Potassium Chloride  ER 20 MEQ TBCR Take 0.5 tablets (10 mEq total) by mouth 3 (three) times a week. Mon/Wed/Fri 45 tablet 1   tamsulosin  (FLOMAX ) 0.4 MG CAPS capsule TAKE 1 CAPSULE BY MOUTH EVERY DAY 90 capsule 1   telmisartan  (MICARDIS ) 20 MG tablet Take 1 tablet (20 mg total) by mouth daily. 90 tablet 3   triamcinolone  (KENALOG ) 0.1 % paste Apply to affected area 1-2 times daily (Patient taking differently: 1 application  daily as needed (mouth sores).) 5 g 12   vitamin B-12 (CYANOCOBALAMIN ) 1000 MCG tablet Take 1,000 mcg by mouth daily.     Zinc  50 MG CAPS Take 50 mg by mouth daily.     No current facility-administered medications for this visit.   No results found.   Review of Systems:   A ROS was performed including pertinent positives and negatives as documented in the HPI.  Physical Exam :   Constitutional: NAD and appears stated age Neurological: Alert and oriented Psych: Appropriate affect and cooperative There were no vitals taken for this visit.   Comprehensive Musculoskeletal Exam:    No tenderness with palpation over the fifth metacarpal of the right hand although there is a small palpable bump in this area.  Grip strength is 4/5 compared to 5/5 on contralateral side.  Radial pulse 2+.  Distal neurosensory exam intact.  Imaging:   Xray (right hand 3 views): Fracture of the proximal fifth metacarpal shaft with significantly increased callus formation and no change in  displacement.    I personally reviewed and interpreted the radiographs.   Assessment:   73 y.o. male approximately 7 weeks status post fracture to the right fifth metacarpal shaft.  He has been utilizing an ulnar gutter brace and recently has been able to begin transition out of this for low-level activity.  Today's x-rays show robust peripheral callus formation and exam is reassuring as he has no pain and overall good strength and range of motion.  Given this I do believe we can have him continue transition out of the brace, however over the next few weeks I would recommend it for higher level activity.  Discussed that we could have him return for repeat x-rays although this is not completely necessary given the amount of healing seen today.  Patient will plan to see how it goes over the coming weeks and will follow-up as needed.  Plan :    - Return to clinic as needed     I personally saw and evaluated the patient, and participated in the management and treatment plan.  Sharrell Deck, PA-C Orthopedics

## 2024-05-03 ENCOUNTER — Other Ambulatory Visit: Payer: Self-pay | Admitting: Family Medicine

## 2024-07-03 ENCOUNTER — Other Ambulatory Visit: Payer: Self-pay | Admitting: Nurse Practitioner

## 2024-07-07 ENCOUNTER — Ambulatory Visit (INDEPENDENT_AMBULATORY_CARE_PROVIDER_SITE_OTHER): Admitting: Family Medicine

## 2024-07-07 ENCOUNTER — Encounter: Payer: Self-pay | Admitting: Family Medicine

## 2024-07-07 VITALS — BP 115/79 | HR 71 | Temp 97.6°F | Wt 185.2 lb

## 2024-07-07 DIAGNOSIS — M1990 Unspecified osteoarthritis, unspecified site: Secondary | ICD-10-CM

## 2024-07-07 DIAGNOSIS — I10 Essential (primary) hypertension: Secondary | ICD-10-CM | POA: Diagnosis not present

## 2024-07-07 DIAGNOSIS — M25551 Pain in right hip: Secondary | ICD-10-CM | POA: Diagnosis not present

## 2024-07-07 DIAGNOSIS — M65319 Trigger thumb, unspecified thumb: Secondary | ICD-10-CM

## 2024-07-07 MED ORDER — PREDNISONE 50 MG PO TABS
ORAL_TABLET | ORAL | 0 refills | Status: DC
Start: 2024-07-07 — End: 2024-09-21

## 2024-07-07 NOTE — Assessment & Plan Note (Signed)
 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  We are starting prednisone  which hopefully should help with this though if this continues to be an ongoing issue will have him follow back up with orthopedics.

## 2024-07-07 NOTE — Patient Instructions (Signed)
 It was very nice to see you today!  VISIT SUMMARY: You visited us  today due to hand pain and hip discomfort. We discussed your symptoms, and a treatment plan was created to help manage your pain and improve your condition.  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 -We recommend seeing Dr. Camella, a hand specialist, for further evaluation and potential cortisone injections. -Take prednisone  for 7 days: 1 pill per day for 5 days, then half a pill for 2 days.  RIGHT HIP PAIN LIKELY DUE TO HAMSTRING TENDINITIS OR TROCHANTERIC BURSITIS: Your right hip pain is likely due to hamstring tendinitis or trochanteric bursitis. -Take prednisone  for 7 days: 1 pill per day for 5 days, then half a pill for 2 days. -If symptoms persist, we may refer you to a physical therapist or hip specialist at [facility].  FOLLOW-UP: We need to assess your response to the treatment. -Follow up in one week to evaluate how you are responding to the prednisone  treatment and to determine the next steps.  Return if symptoms worsen or fail to improve.   Take care, Dr Kennyth  PLEASE NOTE:  If you had any lab tests, please let us  know if you have not heard back within a few days. You may see your results on mychart before we have a chance to review them but we will give you a call once they are reviewed by us .   If we ordered any referrals today, please let us  know if you have not heard from their office within the next week.   If you had any urgent prescriptions sent in today, please check with the pharmacy within an hour of our visit to make sure the prescription was transmitted appropriately.   Please try these tips to maintain a healthy lifestyle:  Eat at least 3 REAL meals and 1-2 snacks per day.  Aim for no more than 5 hours between eating.  If you eat breakfast, please do so within  one hour of getting up.   Each meal should contain half fruits/vegetables, one quarter protein, and one quarter carbs (no bigger than a computer mouse)  Cut down on sweet beverages. This includes juice, soda, and sweet tea.   Drink at least 1 glass of water  with each meal and aim for at least 8 glasses per day  Exercise at least 150 minutes every week.

## 2024-07-07 NOTE — Assessment & Plan Note (Signed)
 Blood pressure at goal today without meds.

## 2024-07-07 NOTE — Progress Notes (Signed)
   IBAN UTZ is a 73 y.o. male who presents today for an office visit.  Assessment/Plan:  Trigger Thumb  No red flags.  This has been going on for at least a year or more.  We discussed potential treatment options including splinting, anti-inflammatories, and cortisone injections.  They are interested in pursuing cortisone injections.  Will place referral to hand specialist.  Also starting prednisone  burst today for his below hip pain hopefully should help some with his hand pain and trigger thumb as well.  Will defer further management to hand surgery  Right hip pain Saw orthopedics for this several months ago.  Likely has hamstring tendinitis versus ischial bursitis.  We did discuss potential treatment options for this as well.  Will be starting prednisone  burst.  Consider referral to PT or to orthopedics if not improving with this.  Did have a plain film performed several months ago which showed osteoarthritis though do not think that this is contributing significantly to his current pain.  Chronic Problems Addressed Today: No problem-specific Assessment & Plan notes found for this encounter.     Subjective:  HPI:  See assessment / plan for status of chronic conditions.   Discussed the use of AI scribe software for clinical note transcription with the patient, who gave verbal consent to proceed.  History of Present Illness AMAAD BYERS is a 73 year old male with arthritis who presents with hand pain and hip discomfort.  He has been experiencing hand pain, particularly in his thumbs, for at least a year. The symptoms include 'trigger finger' where his thumbs lock up. He sustained a boxer fracture in April and wore a splint for one to two months. He has not received any injections in his hands before.  This is been going on for quite a while.  No specific treatments tried.  He also experiences right hip discomfort, described as pain in the right buttock area that does not radiate  far. The pain is mostly present when sitting and eases after moving around for about half an hour. He has tried topical treatments like Voltaren  and another foam for nerve pain, which have provided some relief. A previous x-ray was performed on his hip.  He has a history of knee issues, having had both knees replaced. He previously received gel shots in his knees, which did not provide significant relief, leading to knee replacement surgery. His knees are currently doing well.         Objective:  Physical Exam: Temp 97.6 F (36.4 C) (Temporal)   Wt 185 lb 3.2 oz (84 kg)   BMI 33.87 kg/m   Gen: No acute distress, resting comfortably MUSCULOSKELETAL: Bony hypertrophy noted.  Trigger thumb noted bilateral thumbs. Neuro: Grossly normal, moves all extremities Psych: Normal affect and thought content      Anastaisa Wooding M. Kennyth, MD 07/07/2024 7:47 AM

## 2024-07-13 ENCOUNTER — Other Ambulatory Visit (HOSPITAL_BASED_OUTPATIENT_CLINIC_OR_DEPARTMENT_OTHER): Payer: Self-pay

## 2024-07-13 ENCOUNTER — Ambulatory Visit (INDEPENDENT_AMBULATORY_CARE_PROVIDER_SITE_OTHER): Admitting: Student

## 2024-07-13 DIAGNOSIS — M7918 Myalgia, other site: Secondary | ICD-10-CM | POA: Diagnosis not present

## 2024-07-13 DIAGNOSIS — M65311 Trigger thumb, right thumb: Secondary | ICD-10-CM | POA: Diagnosis not present

## 2024-07-13 DIAGNOSIS — M65312 Trigger thumb, left thumb: Secondary | ICD-10-CM

## 2024-07-13 MED ORDER — TRIAMCINOLONE ACETONIDE 40 MG/ML IJ SUSP
40.0000 mg | INTRAMUSCULAR | Status: AC | PRN
  Administered 2024-07-13: 40 mg

## 2024-07-13 MED ORDER — LIDOCAINE HCL 1 % IJ SOLN
1.0000 mL | INTRAMUSCULAR | Status: AC | PRN
  Administered 2024-07-13: 1 mL

## 2024-07-13 NOTE — Addendum Note (Signed)
 Addended by: LYNNANN HARVEY E on: 07/13/2024 01:28 PM   Modules accepted: Orders

## 2024-07-13 NOTE — Progress Notes (Signed)
 Chief Complaint: Bilateral thumb pain, right buttock pain     History of Present Illness:    Frank [name] is a left-hand-dominant 73 y.o. male who presents today with a chief complaint of pain in both hands, primarily of the thumbs and ring fingers.  Patient reports that these fingers frequently pop, click, and occasionally get stuck.  This has been ongoing for approximately 1 year.  He was treated conservatively for a fifth metatarsal fracture that occurred in April of this year and he reports no pain over this area.  He has been taking Tylenol  as needed.  Also continues to report some pain within the right buttock area that he has not yet gotten relief from.  We did discuss this at a visit back in May.  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   Surgical History:   None  PMH/PSH/Family History/Social History/Meds/Allergies:    Past Medical History:  Diagnosis Date   Allergy    Asthma    Coronary artery disease    GERD 01/30/2008   Qualifier: Diagnosis of   By: Krystal MD, Reyes A        GERD (gastroesophageal reflux disease)    Headache 01/30/2008   Qualifier: Diagnosis of   By: Krystal MD, Reyes A     Replacing diagnoses that were inactivated after the 01/28/23 regulatory import     Headache(784.0)    History of kidney stones    Hypertension    Neck pain 02/15/2017   Obese 08/17/2015   Osteoarthritis of knee    right   Past Surgical History:  Procedure Laterality Date   CARDIAC CATHETERIZATION     HERNIA REPAIR  1969, 2009   kidney stones  05/20/1995   KNEE SURGERY  02/26/1989   MITRAL VALVE REPAIR  01/02/2022   NASAL SINUS SURGERY  10/24/1992   RIGHT/LEFT HEART CATH AND CORONARY ANGIOGRAPHY N/A 11/15/2021   Procedure: RIGHT/LEFT HEART CATH AND CORONARY ANGIOGRAPHY;  Surgeon: Wendel Lurena POUR, MD;  Location: [facility];  Service: Cardiovascular;  Laterality: N/A;   ROTATOR CUFF REPAIR  Left 04/25/1993   TEE WITHOUT CARDIOVERSION N/A 08/24/2021   Procedure: TRANSESOPHAGEAL ECHOCARDIOGRAM (TEE);  Surgeon: Lonni Slain, MD;  Location: [facility] ENDOSCOPY;  Service: Cardiovascular;  Laterality: N/A;   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   Procedure: LEFT TOTAL KNEE ARTHROPLASTY;  Surgeon: Addie Cordella Hamilton, MD;  Location: [facility] OR;  Service: Orthopedics;  Laterality: Left;   TOTAL KNEE ARTHROPLASTY Right 04/11/2023   Procedure: RIGHT TOTAL KNEE ARTHROPLASTY-CEMENTED;  Surgeon: Addie Cordella Hamilton, MD;  Location: [facility] OR;  Service: Orthopedics;  Laterality: Right;   Social History   Socioeconomic History   Marital status: Married    Spouse name: Not on file   Number of children: 2   Years of education: 11   Highest education level: 11th grade  Occupational History   Not on file  Tobacco Use   Smoking status: Former    Current packs/day: 0.00    Types: Cigarettes    Quit date: 1972    Years since quitting: 53.7   Smokeless tobacco: Never   Tobacco comments:    quit in 1972  Vaping Use   Vaping status: Never Used  Substance and Sexual Activity   Alcohol use: Not Currently  Drug use: Never   Sexual activity: Not Currently  Other Topics Concern   Not on file  Social History Narrative   Not on file   Social Drivers of Health   Financial Resource Strain: Low Risk  (11/19/2023)   Overall Financial Resource Strain (CARDIA)    Difficulty of Paying Living Expenses: Not hard at all  Food Insecurity: No Food Insecurity (11/19/2023)   Hunger Vital Sign    Worried About Running Out of Food in the Last Year: Never true    Ran Out of Food in the Last Year: Never true  Transportation Needs: No Transportation Needs (11/19/2023)   PRAPARE - Administrator, Civil Service (Medical): No    Lack of Transportation (Non-Medical): No  Physical Activity: Sufficiently Active (11/19/2023)   Exercise Vital Sign    Days of Exercise per Week: 5 days    Minutes of  Exercise per Session: 30 min  Stress: No Stress Concern Present (11/19/2023)   [employer] of Occupational Health - Occupational Stress Questionnaire    Feeling of Stress : Not at all  Social Connections: Moderately Isolated (11/19/2023)   Social Connection and Isolation Panel    Frequency of Communication with Friends and Family: More than three times a week    Frequency of Social Gatherings with Friends and Family: More than three times a week    Attends Religious Services: Never    Database administrator or Organizations: No    Attends Engineer, structural: Never    Marital Status: Married   Family History  Problem Relation Age of Onset   Kidney disease Mother    COPD Father    Pneumonia Father    Cardiomyopathy Brother    Hypertension Sister    Heart disease Brother    Hypertension Brother    Hypertension Brother    Hypertension Sister    Diabetes Sister    Osteoarthritis Sister    Allergies  Allergen Reactions   Meloxicam  Hives and Shortness Of Breath   Atorvastatin  Other (See Comments)    Joint pain   Metoprolol  Rash    Weakness / fatigue    Tomato     Mouth breaks out   Current Outpatient Medications  Medication Sig Dispense Refill   acetaminophen  (TYLENOL ) 325 MG tablet Take 1-2 tablets (325-650 mg total) by mouth every 6 (six) hours as needed for mild pain (pain score 1-3 or temp > 100.5). 60 tablet 0   ascorbic acid  (VITAMIN C ) 500 MG tablet Take 500 mg by mouth daily.     aspirin  81 MG chewable tablet Chew 1 tablet (81 mg total) by mouth 2 (two) times daily. (Patient taking differently: Chew 81 mg by mouth daily.) 60 tablet 0   cholecalciferol  (VITAMIN D ) 25 MCG (1000 UNIT) tablet Take 1,000 Units by mouth daily.     cyclobenzaprine  (FLEXERIL ) 10 MG tablet Take 1 tablet (10 mg total) by mouth 3 (three) times daily as needed for muscle spasms. 30 tablet 0   fluticasone  (FLONASE ) 50 MCG/ACT nasal spray Place 1 spray into both nostrils daily as needed  for allergies or rhinitis.     furosemide  (LASIX ) 20 MG tablet TAKE 1 TABLET TWICE A DAY MON/WED/FRI ONLY AS DIRECTED 90 tablet 3   Potassium Chloride  ER 20 MEQ TBCR TAKE 0.5 TABLETS (10 MEQ TOTAL) BY MOUTH 3 (THREE) TIMES A WEEK. MON/WED/FRI 19 tablet 0   predniSONE  (DELTASONE ) 50 MG tablet Take 1 tablet daily for 5 days.  Then take 1/2 tablet daily for 2 days. 6 tablet 0   tamsulosin  (FLOMAX ) 0.4 MG CAPS capsule TAKE 1 CAPSULE BY MOUTH EVERY DAY 90 capsule 1   telmisartan  (MICARDIS ) 20 MG tablet Take 1 tablet (20 mg total) by mouth daily. 90 tablet 3   triamcinolone  (KENALOG ) 0.1 % paste Apply to affected area 1-2 times daily (Patient taking differently: 1 application  daily as needed (mouth sores).) 5 g 12   vitamin B-12 (CYANOCOBALAMIN ) 1000 MCG tablet Take 1,000 mcg by mouth daily.     Zinc  50 MG CAPS Take 50 mg by mouth daily.     No current facility-administered medications for this visit.   No results found.  Review of Systems:   A ROS was performed including pertinent positives and negatives as documented in the HPI.  Physical Exam :   Constitutional: NAD and appears stated age Neurological: Alert and oriented Psych: Appropriate affect and cooperative There were no vitals taken for this visit.   Comprehensive Musculoskeletal Exam:    Exam of bilateral hands demonstrates some active triggering of the left fourth finger and right thumb.  There is tenderness dorsally at the A1 pulley of bilateral thumbs and fourth fingers.  No overlying erythema or warmth.  Decreased grip and pinch strength bilaterally.  Pain palpable directly over the right ischial tuberosity without any notable deficits in right hamstring strength.  Imaging:     Assessment:   73 y.o. male who presents today with multiple trigger fingers of both hands, primarily affecting the thumbs and fourth fingers.  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  He does work with his hands a lot restoring furniture pieces and symptoms are limiting his grip strength and dexterity.  I have offered to perform 2 trigger finger injections for symptom relief.  Patient would like to proceed with this on bilateral thumbs, which was performed under ultrasound guidance today.  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 Arlinda.  In terms of his chronic buttock pain, this does appear to be more ischial pain as opposed to radicular.  I think most likely this would represent underlying ischial bursitis versus proximal hamstring.  Discussed options and patient would like to proceed with referral to Dr. Burnetta for potential shockwave or injection, as opposed to beginning with physical therapy at this time.  Plan :    - Bilateral trigger thumb cortisone injection performed today - Referral to Dr. Erwin for further management of trigger finger and CMC arthritis - Referral to Dr. Burnetta for further evaluation and management of chronic ischial pain      Procedure Note  Patient: CHRISS MANNAN             Date of Birth: 08/04/1951           MRN: [id number]             Visit Date: 07/13/2024  Procedures: Visit Diagnoses:  1. Trigger finger of left thumb   2. Trigger thumb, right thumb     Hand/UE Inj: bilateral thumb A1 for trigger finger on 07/13/2024 9:44 AM Indications: pain Details: 25 G needle, volar approach Medications (Right): 1 mL lidocaine  1 %; 40 mg triamcinolone  acetonide 40 MG/ML Medications (Left): 1 mL lidocaine  1 %;  40 mg triamcinolone  acetonide 40 MG/ML Outcome: tolerated well, no immediate complications Procedure, treatment alternatives, risks and benefits explained, specific risks discussed. Consent was given by the patient. Immediately prior to procedure a time out was called to  verify the correct patient, procedure, equipment, support staff and site/side marked as required. Patient was prepped and draped in the usual sterile fashion.       I personally saw and evaluated the patient, and participated in the management and treatment plan.  Leonce Reveal, PA-C Orthopedics

## 2024-07-29 ENCOUNTER — Other Ambulatory Visit: Payer: Self-pay | Admitting: Internal Medicine

## 2024-08-02 ENCOUNTER — Other Ambulatory Visit: Payer: Self-pay | Admitting: Internal Medicine

## 2024-08-03 ENCOUNTER — Ambulatory Visit: Admitting: Orthopedic Surgery

## 2024-08-03 DIAGNOSIS — M65342 Trigger finger, left ring finger: Secondary | ICD-10-CM

## 2024-08-03 DIAGNOSIS — M65312 Trigger thumb, left thumb: Secondary | ICD-10-CM | POA: Diagnosis not present

## 2024-08-03 DIAGNOSIS — M65311 Trigger thumb, right thumb: Secondary | ICD-10-CM | POA: Diagnosis not present

## 2024-08-03 NOTE — Progress Notes (Signed)
 Frank [name] - 73 y.o. male MRN [id number]  Date of birth: April 05, 1951  Office Visit Note: Visit Date: 08/03/2024 PCP: Kennyth Worth HERO, MD Referred by: Kennyth Worth HERO, MD  Subjective: No chief complaint on file.  HPI: Frank [name] is a pleasant 73 y.o. male who presents today for evaluation of bilateral thumb trigger digits.  He has been seen recently by Leonce Reveal, PA who injected the thumb triggers a few weeks prior.  States that the injections have helped drastically.  Also does have previous history of left ring finger trigger which does occasionally click, no significant locking.  He is here today for specific hand surgical evaluation.  Pertinent ROS were reviewed with the patient and found to be negative unless otherwise specified above in HPI.   Visit Reason: bilateral thumb trigger digits, left ring finger trigger Duration of symptoms: 1 year Hand dominance: left Occupation: retired Diabetic: No Smoking: No Heart/Lung History: microvalve replacement Blood Thinners:  baby aspirin   Prior Testing/EMG: xrays 03/30/24 Injections (Date): 07/13/24 Treatments: injections bilateral thumbs Prior Surgery: none    Assessment & Plan: Visit Diagnoses:  1. Trigger finger of left thumb   2. Trigger thumb, right thumb   3. Trigger finger, left ring finger     Plan: Extensive discussion with the patient today regarding his bilateral thumb trigger digits.  We discussed the underlying etiology and pathophysiology of stenosing tenosynovitis.  We did discuss that should symptoms remain refractory conservative care in the future he would be an appropriate candidate for bilateral, staged thumb trigger digit releases.    Risks and benefits of the procedure were discussed, risks including but not limited to infection, bleeding, scarring, stiffness, nerve injury, tendon injury, vascular injury, recurrence of symptoms and need for subsequent operation.  We also discussed the  appropriate postoperative protocol and timeframe for return to activities and function.  Patient expressed understanding.    As far as the left ring finger I did also explained to him that this is a trigger digit which could respond to injection therapy.  At this juncture he would like to continue with observation for the time being.  He will return to me in the future for repeat discussion should the symptoms recur or worsen.  Follow-up: No follow-ups on file.   Meds & Orders: No orders of the defined types were placed in this encounter.  No orders of the defined types were placed in this encounter.    Procedures: No procedures performed      Clinical History: No specialty comments available.  He reports that he quit smoking about 53 years ago. His smoking use included cigarettes. He has never used smokeless tobacco.  Recent Labs    08/08/23 0824  HGBA1C 6.4    Objective:   Vital Signs: There were no vitals taken for this visit.  Physical Exam  Gen: Well-appearing, in no acute distress; non-toxic CV: Regular Rate. Well-perfused. Warm.  Resp: Breathing unlabored on room air; no wheezing. Psych: Fluid speech in conversation; appropriate affect; normal thought process  Ortho Exam Bilateral hand: - Palpable nodule at the A1 pulley of the bilateral thumbs, mild tenderness - No significant clicking with deep flexion of the bilateral thumb IP, no  evidence of significant locking with deep flexion - Sensation intact distally, hand remains warm well-perfused - Left ring finger with palpable nodule at the A1 pulley, mild tenderness, there is observed clicking with deep flexion   Imaging: No results found.  Past Medical/Family/Surgical/Social History:  Medications & Allergies reviewed per EMR, new medications updated. 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   Arthritis of right knee 12/18/2022   Nonrheumatic mitral valve  regurgitation s/p repair 2023    Peroneal tendinitis of right lower extremity 04/25/2021   Benign localized prostatic hyperplasia with lower urinary tract symptoms (LUTS) 09/27/2020   Wheezing 09/14/2020   Chondrocalcinosis 03/14/2020   Dyslipidemia 09/23/2017   Bilateral primary osteoarthritis of knee 09/02/2017   Osteoarthritis 02/15/2017   Tear of right acetabular labrum 02/15/2017   Right leg pain 01/04/2017   Elevated blood sugar 08/17/2015   Dyspnea 11/27/2011   HERNIA, UMBILICAL 08/02/2008   Essential hypertension 01/30/2008   Allergic rhinitis 01/30/2008   Past Medical History:  Diagnosis Date   Allergy    Asthma    Coronary artery disease    GERD 01/30/2008   Qualifier: Diagnosis of   By: Krystal MD, Reyes A        GERD (gastroesophageal reflux disease)    Headache 01/30/2008   Qualifier: Diagnosis of   By: Krystal MD, Reyes A     Replacing diagnoses that were inactivated after the 01/28/23 regulatory import     Headache(784.0)    History of kidney stones    Hypertension    Neck pain 02/15/2017   Obese 08/17/2015   Osteoarthritis of knee    right   Family History  Problem Relation Age of Onset   Kidney disease Mother    COPD Father    Pneumonia Father    Cardiomyopathy Brother    Hypertension Sister    Heart disease Brother    Hypertension Brother    Hypertension Brother    Hypertension Sister    Diabetes Sister    Osteoarthritis Sister    Past Surgical History:  Procedure Laterality Date   CARDIAC CATHETERIZATION     HERNIA REPAIR  1969, 2009   kidney stones  05/20/1995   KNEE SURGERY  02/26/1989   MITRAL VALVE REPAIR  01/02/2022   NASAL SINUS SURGERY  10/24/1992   RIGHT/LEFT HEART CATH AND CORONARY ANGIOGRAPHY N/A 11/15/2021   Procedure: RIGHT/LEFT HEART CATH AND CORONARY ANGIOGRAPHY;  Surgeon: Wendel Lurena POUR, MD;  Location: [facility] INVASIVE CV LAB;  Service: Cardiovascular;  Laterality: N/A;   ROTATOR CUFF REPAIR Left 04/25/1993   TEE WITHOUT  CARDIOVERSION N/A 08/24/2021   Procedure: TRANSESOPHAGEAL ECHOCARDIOGRAM (TEE);  Surgeon: Lonni Slain, MD;  Location: [facility] ENDOSCOPY;  Service: Cardiovascular;  Laterality: N/A;   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   Procedure: LEFT TOTAL KNEE ARTHROPLASTY;  Surgeon: Addie Cordella Hamilton, MD;  Location: [facility] OR;  Service: Orthopedics;  Laterality: Left;   TOTAL KNEE ARTHROPLASTY Right 04/11/2023   Procedure: RIGHT TOTAL KNEE ARTHROPLASTY-CEMENTED;  Surgeon: Addie Cordella Hamilton, MD;  Location: [facility] OR;  Service: Orthopedics;  Laterality: Right;   Social History   Occupational History   Not on file  Tobacco Use   Smoking status: Former    Current packs/day: 0.00    Types: Cigarettes    Quit date: 1972    Years since quitting: 53.8   Smokeless tobacco: Never   Tobacco comments:    quit in 1972  Vaping Use   Vaping status: Never Used  Substance and Sexual Activity   Alcohol use: Not Currently   Drug use: Never   Sexual activity: Not Currently    Calle Schader Estela) Arlinda, M.D.  [facility], Hand Surgery

## 2024-08-31 ENCOUNTER — Encounter: Payer: Self-pay | Admitting: Radiology

## 2024-09-18 NOTE — Progress Notes (Unsigned)
 Cardiology Office Note:   Date:  09/21/2024  ID:  Frank [name], DOB 03/09/1951, MRN [id number] PCP:  Kennyth Worth HERO, MD  [facility] Providers Cardiologist:  Wendel Haws, MD Referring MD: Kennyth Worth HERO, MD  Chief Complaint/Reason for Referral: Follow-up mitral regurgitation ASSESSMENT:    1. H/O mitral valve repair   2. Diastolic dysfunction   3. Coronary artery disease involving native coronary artery of native heart without angina pectoris   4. Hyperlipidemia LDL goal <70   5. Essential hypertension   6. Prediabetes   7. BMI 34.0-34.9,adult     PLAN:   In order of problems listed above: Mitral regurgitation status post repair: Most recent echocardiogram demonstrates mild mitral regurgitation and moderate iatrogenic mitral stenosis due to Alfieri stitch.  Monitor for now. Diastolic dysfunction: Continue Lasix  20 mg daily, restart telmisartan  20 mg at bedtime, start Jardiance  10 mg daily Coronary artery disease: Continue aspirin  81 mg, intolerant to multiple statins Hyperlipidemia: LDL last year was 107.  Check lipid panel today.  Intolerant of both Crestor  and atorvastatin ; refer to pharmacy for further recommendations regarding statin alternatives.   Hypertension: Patient has not been taking telmisartan .  Will restart telmisartan  20 mg at bedtime Prediabetes: Followed by PCP.  Start Jardiance  10 mg daily Elevated BMI: Diet and exercise for now; if patient develops a diagnosis of diabetes and GLP-1 receptor agonist therapy should be considered.  Hemoglobin A1c last year was 6.4           Dispo:  No follow-ups on file.       I spent 37 minutes reviewing all clinical data during and prior to this visit including all relevant imaging studies, laboratories, clinical information from other health systems and prior notes from both Cardiology and other specialties, interviewing the patient, conducting a complete physical examination, and coordinating care in order to  formulate a comprehensive and personalized evaluation and treatment plan.   History of Present Illness:    FOCUSED PROBLEM LIST:   MR Mitral valve repair 36mm Simulus ring, Gore-Tex cord P2, Alfieri stitch 2023 [employer] Coronary artery disease Nonobstructive disease  coronary angiography 2023 Diastolic dysfunction Mild LVH EF 50 to 50% TTE May 2023 Hypertension Hyperlipidemia Intolerant of atorvastatin , rosuvastatin  LP(a) less than 8.4 1 AVB plus IRBBB Prediabetes  Prior tobacco abuse Asthma BMI 34   April 2023:  The patient is a 73 y.o. male with the indicated medical history here for for routine cardiology follow-up.  He was referred to an external institution for consideration for mitral valve repair.  This was conducted in March and his postoperative course was complicated by supraventricular tachycardia that was short-lived.  He was discharged on a beta-blocker.   The patient is doing well.  He has some minor aches and pains around his incision sites.  His incisions are well healed.  He denies any significant shortness of breath, palpitations, paroxysmal nocturnal dyspnea, orthopnea.  He has some mild peripheral edema which seems to get better with recumbency.  His weight has increased by about 10 pounds since discharge.  He has required no hospitalizations or emergency room visits.  He has not yet been participating in cardiac rehabilitation.  He is otherwise well and without significant complaints.  Plan: Obtain echocardiogram, refer to cardiac rehabilitation, resume Lasix .   December 2024: In the interim a TTE demonstrated mild mitral regurgitation with a mean gradient of 6 mmHg and a preserved ejection fraction.  He was seen in August 2023 and he was noted  to have increased lower extremity edema and his Lasix  was increased to 60 mg for 3 days then reduced back to 40 mg twice a day.  His blood pressure was well-controlled.  He was seen again in December 2023 and his Lasix   was changed to 3 times a week dosing.  His blood pressure is well-controlled.  He underwent uncomplicated left TKA in February of this year.  An LDL drawn in October of this year was 107.  He stopped his Crestor  due to problems with arthralgias and myalgias.   The patient is doing very well.  He denies any shortness of breath or chest pain.  He has been experimenting with taking his Lasix  maybe once a day.  He has had no issues with this.  He denies any peripheral edema or paroxysmal nocturnal dyspnea.  He has not required any emergency room visits or hospitalizations.  He is very happy with how he feels.  Plan: Start telmisartan  20 mg.  November 2025:  Patient consents to use of AI scribe. The patient returns for routine follow-up.  He is generally doing well with no current breathing difficulties. He has experienced issues with cholesterol medications, having tried two different types without success. His blood pressure readings at home are generally okay, but the diastolic number remains high. He currently takes a diuretic daily, although his chart indicates it should be taken as needed. His weight fluctuates, which influences his diuretic use. He is on telmisartan  for blood pressure management but has not been taking it due to fatigue. He was taking it in the morning but plans to try taking it at bedtime to mitigate the side effects.  He recently underwent bilateral knee replacement surgeries, with the left knee surgery occurring in February and the right knee in June. Both surgeries went well, and he was discharged the day after each procedure. His right knee was worse than the left, but due to a ligament tear in the left knee, it was operated on first. He acknowledges the need to continue exercises to maintain joint function.     Current Medications: Current Meds  Medication Sig   ascorbic acid  (VITAMIN C ) 500 MG tablet Take 500 mg by mouth daily.   aspirin  81 MG chewable tablet Chew 1 tablet (81  mg total) by mouth 2 (two) times daily. (Patient taking differently: Chew 81 mg by mouth daily.)   cholecalciferol  (VITAMIN D ) 25 MCG (1000 UNIT) tablet Take 1,000 Units by mouth daily.   empagliflozin  (JARDIANCE ) 10 MG TABS tablet Take 1 tablet (10 mg total) by mouth daily before breakfast.   fluticasone  (FLONASE ) 50 MCG/ACT nasal spray Place 1 spray into both nostrils daily as needed for allergies or rhinitis.   Potassium Chloride  ER 20 MEQ TBCR TAKE 0.5 TABLETS (10 MEQ TOTAL) BY MOUTH 3 (THREE) TIMES A WEEK. MON/WED/FRI   tamsulosin  (FLOMAX ) 0.4 MG CAPS capsule TAKE 1 CAPSULE BY MOUTH EVERY DAY   triamcinolone  (KENALOG ) 0.1 % paste Apply to affected area 1-2 times daily (Patient taking differently: 1 application  daily as needed (mouth sores).)   vitamin B-12 (CYANOCOBALAMIN ) 1000 MCG tablet Take 1,000 mcg by mouth daily.   Zinc  50 MG CAPS Take 50 mg by mouth daily.   [DISCONTINUED] furosemide  (LASIX ) 20 MG tablet TAKE 1 TABLET TWICE A DAY MON/WED/FRI ONLY AS DIRECTED     Review of Systems:   Please see the history of present illness.    All other systems reviewed and are negative.  EKGs/Labs/Other Test Reviewed:   EKG: Sinus rhythm, first-degree AV block, incomplete right bundle branch block, LVH  EKG Interpretation Date/Time:  Monday September 21 2024 10:34:52 EST Ventricular Rate:  63 PR Interval:  210 QRS Duration:  94 QT Interval:  424 QTC Calculation: 433 R Axis:   -25  Text Interpretation: Sinus rhythm with 1st degree A-V block Minimal voltage criteria for LVH, may be normal variant ( R in aVL ) When compared with ECG of 04-Oct-2023 13:52, Criteria for Inferior infarct are no longer Present Nonspecific T wave abnormality has replaced inverted T waves in Inferior leads Confirmed by Wendel Haws ([id number]) on 09/21/2024 10:38:58 AM        CARDIAC STUDIES: Refer to CV Procedures and Imaging Tabs   Risk Assessment/Calculations:          Physical Exam:   VS:  BP (!)  140/80   Pulse 64   Ht 5' 2 (1.575 m)   Wt 184 lb (83.5 kg)   SpO2 96%   BMI 33.65 kg/m    HYPERTENSION CONTROL Vitals:   09/21/24 1031 09/21/24 1054  BP: (!) 158/96 (!) 140/80    The patient's blood pressure is elevated above target today.  In order to address the patient's elevated BP: A new medication was prescribed today.      Wt Readings from Last 3 Encounters:  09/21/24 184 lb (83.5 kg)  07/07/24 185 lb 3.2 oz (84 kg)  02/13/24 182 lb 15.7 oz (83 kg)      GENERAL:  No apparent distress, AOx3 HEENT:  No carotid bruits, +2 carotid impulses, no scleral icterus CAR: RRR no murmurs, gallops, rubs, or thrills RES:  Clear to auscultation bilaterally ABD:  Soft, nontender, nondistended, positive bowel sounds x 4 VASC:  +2 radial pulses, +2 carotid pulses NEURO:  CN 2-12 grossly intact; motor and sensory grossly intact PSYCH:  No active depression or anxiety EXT:  No edema, ecchymosis, or cyanosis  Signed, Cameshia Cressman K Mairely Foxworth, MD  09/21/2024 11:02 AM    [facility] [address] [place], [place], [place]  [address] Phone: [phone]; Fax: [phone]   Note:  This document was prepared using Dragon voice recognition software and may include unintentional dictation errors.

## 2024-09-21 ENCOUNTER — Ambulatory Visit: Attending: Internal Medicine | Admitting: Internal Medicine

## 2024-09-21 ENCOUNTER — Encounter: Payer: Self-pay | Admitting: Internal Medicine

## 2024-09-21 VITALS — BP 140/80 | HR 64 | Ht 62.0 in | Wt 184.0 lb

## 2024-09-21 DIAGNOSIS — I10 Essential (primary) hypertension: Secondary | ICD-10-CM

## 2024-09-21 DIAGNOSIS — R7303 Prediabetes: Secondary | ICD-10-CM

## 2024-09-21 DIAGNOSIS — I5189 Other ill-defined heart diseases: Secondary | ICD-10-CM

## 2024-09-21 DIAGNOSIS — E785 Hyperlipidemia, unspecified: Secondary | ICD-10-CM

## 2024-09-21 DIAGNOSIS — I2510 Atherosclerotic heart disease of native coronary artery without angina pectoris: Secondary | ICD-10-CM

## 2024-09-21 DIAGNOSIS — N182 Chronic kidney disease, stage 2 (mild): Secondary | ICD-10-CM

## 2024-09-21 DIAGNOSIS — Z9889 Other specified postprocedural states: Secondary | ICD-10-CM

## 2024-09-21 DIAGNOSIS — Z6834 Body mass index (BMI) 34.0-34.9, adult: Secondary | ICD-10-CM

## 2024-09-21 MED ORDER — TELMISARTAN 20 MG PO TABS: 20.0000 mg | ORAL_TABLET | Freq: Every day | ORAL | 3 refills | Status: AC

## 2024-09-21 MED ORDER — FUROSEMIDE 20 MG PO TABS: 20.0000 mg | ORAL_TABLET | Freq: Every day | ORAL | 3 refills | Status: AC

## 2024-09-21 MED ORDER — EMPAGLIFLOZIN 10 MG PO TABS: 10.0000 mg | ORAL_TABLET | Freq: Every day | ORAL | 3 refills | Status: AC

## 2024-09-21 NOTE — Patient Instructions (Signed)
 Medication Instructions:  Start Jardiance  10 mg once daily Restart Telmisartan  20 mg at bedtime Take Lasix  20 mg once daily *If you need a refill on your cardiac medications before your next appointment, please call your pharmacy*  Lab Work: Lipids today at [facility] If you have labs (blood work) drawn today and your tests are completely normal, you will receive your results only by: MyChart Message (if you have MyChart) OR A paper copy in the mail If you have any lab test that is abnormal or we need to change your treatment, we will call you to review the results.  Testing/Procedures: NONE  Follow-Up: At [facility], you and your health needs are our priority.  As part of our continuing mission to provide you with exceptional heart care, our providers are all part of one team.  This team includes your primary Cardiologist (physician) and Advanced Practice Providers or APPs (Physician Assistants and Nurse Practitioners) who all work together to provide you with the care you need, when you need it.  Your next appointment:   6 month(s)  Provider:   APP  We recommend signing up for the patient portal called MyChart.  Sign up information is provided on this After Visit Summary.  MyChart is used to connect with patients for Virtual Visits (Telemedicine).  Patients are able to view lab/test results, encounter notes, upcoming appointments, etc.  Non-urgent messages can be sent to your provider as well.   To learn more about what you can do with MyChart, go to [email or web address].   Other Instructions You have been referred to PharmD for lipid management. Someone will reach out to you to make an appointment.

## 2024-09-22 ENCOUNTER — Ambulatory Visit: Payer: Self-pay | Admitting: Internal Medicine

## 2024-09-22 LAB — LIPID PANEL
Chol/HDL Ratio: 5.9 ratio — ABNORMAL HIGH (ref 0.0–5.0)
Cholesterol, Total: 199 mg/dL (ref 100–199)
HDL: 34 mg/dL — ABNORMAL LOW (ref >39)
LDL Chol Calc (NIH): 141 mg/dL — ABNORMAL HIGH (ref 0–99)
Triglycerides: 131 mg/dL (ref 0–149)
VLDL Cholesterol Cal: 24 mg/dL (ref 5–40)

## 2024-10-07 ENCOUNTER — Other Ambulatory Visit (HOSPITAL_BASED_OUTPATIENT_CLINIC_OR_DEPARTMENT_OTHER): Payer: Self-pay

## 2024-10-07 ENCOUNTER — Ambulatory Visit (HOSPITAL_BASED_OUTPATIENT_CLINIC_OR_DEPARTMENT_OTHER): Admitting: Student

## 2024-10-07 DIAGNOSIS — M79604 Pain in right leg: Secondary | ICD-10-CM | POA: Diagnosis not present

## 2024-10-07 MED ORDER — METHYLPREDNISOLONE 4 MG PO TBPK
ORAL_TABLET | ORAL | 0 refills | Status: AC
  Filled 2024-10-07: qty 21, 6d supply, fill #0

## 2024-10-07 NOTE — Progress Notes (Signed)
 Chief Complaint: Right leg pain    Discussed the use of AI scribe software for clinical note transcription with the patient, who gave verbal consent to proceed.  History of Present Illness Frank [name] is a 73 year old male who presents with right buttock pain.  He has had intermittent right buttock pain for about a month, with progressive worsening. The pain is mainly with sitting, does not radiate past the knee, and is not associated with numbness or tingling. Cushions and icing give only temporary relief. He denies recent injury or fall, and the pain does not limit walking or standing. He has no lower back pain. Ibuprofen and Tylenol  have not given significant relief. He takes aspirin  and is not on other blood thinners. He has knee problems treated with therapy and ice, and he has had heart surgery. He works in a physically geologist, engineering job.   Surgical History:   None  PMH/PSH/Family History/Social History/Meds/Allergies:    Past Medical History:  Diagnosis Date   Allergy    Asthma    Coronary artery disease    GERD 01/30/2008   Qualifier: Diagnosis of   By: Krystal MD, Reyes A        GERD (gastroesophageal reflux disease)    Headache 01/30/2008   Qualifier: Diagnosis of   By: Krystal MD, Reyes A     Replacing diagnoses that were inactivated after the 01/28/23 regulatory import     Headache(784.0)    History of kidney stones    Hypertension    Neck pain 02/15/2017   Obese 08/17/2015   Osteoarthritis of knee    right   Past Surgical History:  Procedure Laterality Date   CARDIAC CATHETERIZATION     HERNIA REPAIR  1969, 2009   kidney stones  05/20/1995   KNEE SURGERY  02/26/1989   MITRAL VALVE REPAIR  01/02/2022   NASAL SINUS SURGERY  10/24/1992   RIGHT/LEFT HEART CATH AND CORONARY ANGIOGRAPHY N/A 11/15/2021   Procedure: RIGHT/LEFT HEART CATH AND CORONARY ANGIOGRAPHY;  Surgeon: Wendel Lurena POUR, MD;  Location: [facility] INVASIVE CV LAB;   Service: Cardiovascular;  Laterality: N/A;   ROTATOR CUFF REPAIR Left 04/25/1993   TEE WITHOUT CARDIOVERSION N/A 08/24/2021   Procedure: TRANSESOPHAGEAL ECHOCARDIOGRAM (TEE);  Surgeon: Lonni Slain, MD;  Location[facility] ENDOSCOPY;  Service: Cardiovascular;  Laterality: N/A;   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   Procedure: LEFT TOTAL KNEE ARTHROPLASTY;  Surgeon: Addie Cordella Hamilton, MD;  Location: [facility] OR;  Service: Orthopedics;  Laterality: Left;   TOTAL KNEE ARTHROPLASTY Right 04/11/2023   Procedure: RIGHT TOTAL KNEE ARTHROPLASTY-CEMENTED;  Surgeon: Addie Cordella Hamilton, MD;  Location: [facility] OR;  Service: Orthopedics;  Laterality: Right;   Social History   Socioeconomic History   Marital status: Married    Spouse name: Not on file   Number of children: 2   Years of education: 11   Highest education level: 11th grade  Occupational History   Not on file  Tobacco Use   Smoking status: Former    Current packs/day: 0.00    Types: Cigarettes    Quit date: 1972    Years since quitting: 53.9   Smokeless tobacco: Never   Tobacco comments:    quit in 1972  Vaping Use   Vaping status: Never Used  Substance and Sexual  Activity   Alcohol use: Not Currently   Drug use: Never   Sexual activity: Not Currently  Other Topics Concern   Not on file  Social History Narrative   Not on file   Social Drivers of Health   Financial Resource Strain: Low Risk  (11/19/2023)   Overall Financial Resource Strain (CARDIA)    Difficulty of Paying Living Expenses: Not hard at all  Food Insecurity: No Food Insecurity (11/19/2023)   Hunger Vital Sign    Worried About Running Out of Food in the Last Year: Never true    Ran Out of Food in the Last Year: Never true  Transportation Needs: No Transportation Needs (11/19/2023)   PRAPARE - Administrator, Civil Service (Medical): No    Lack of Transportation (Non-Medical): No  Physical Activity: Sufficiently Active (11/19/2023)   Exercise Vital  Sign    Days of Exercise per Week: 5 days    Minutes of Exercise per Session: 30 min  Stress: No Stress Concern Present (11/19/2023)   [employer] of Occupational Health - Occupational Stress Questionnaire    Feeling of Stress : Not at all  Social Connections: Moderately Isolated (11/19/2023)   Social Connection and Isolation Panel    Frequency of Communication with Friends and Family: More than three times a week    Frequency of Social Gatherings with Friends and Family: More than three times a week    Attends Religious Services: Never    Database Administrator or Organizations: No    Attends Engineer, Structural: Never    Marital Status: Married   Family History  Problem Relation Age of Onset   Kidney disease Mother    COPD Father    Pneumonia Father    Cardiomyopathy Brother    Hypertension Sister    Heart disease Brother    Hypertension Brother    Hypertension Brother    Hypertension Sister    Diabetes Sister    Osteoarthritis Sister    Allergies  Allergen Reactions   Meloxicam  Hives and Shortness Of Breath   Atorvastatin  Other (See Comments)    Joint pain   Metoprolol  Rash    Weakness / fatigue    Tomato     Mouth breaks out   Current Outpatient Medications  Medication Sig Dispense Refill   ascorbic acid  (VITAMIN C ) 500 MG tablet Take 500 mg by mouth daily.     aspirin  81 MG chewable tablet Chew 1 tablet (81 mg total) by mouth 2 (two) times daily. (Patient taking differently: Chew 81 mg by mouth daily.) 60 tablet 0   cholecalciferol  (VITAMIN D ) 25 MCG (1000 UNIT) tablet Take 1,000 Units by mouth daily.     empagliflozin  (JARDIANCE ) 10 MG TABS tablet Take 1 tablet (10 mg total) by mouth daily before breakfast. 90 tablet 3   fluticasone  (FLONASE ) 50 MCG/ACT nasal spray Place 1 spray into both nostrils daily as needed for allergies or rhinitis.     furosemide  (LASIX ) 20 MG tablet Take 1 tablet (20 mg total) by mouth daily. 90 tablet 3   Potassium Chloride   ER 20 MEQ TBCR TAKE 0.5 TABLETS (10 MEQ TOTAL) BY MOUTH 3 (THREE) TIMES A WEEK. MON/WED/FRI 19 tablet 0   tamsulosin  (FLOMAX ) 0.4 MG CAPS capsule TAKE 1 CAPSULE BY MOUTH EVERY DAY 90 capsule 1   telmisartan  (MICARDIS ) 20 MG tablet Take 1 tablet (20 mg total) by mouth at bedtime. 90 tablet 3   triamcinolone  (KENALOG ) 0.1 % paste  Apply to affected area 1-2 times daily (Patient taking differently: 1 application  daily as needed (mouth sores).) 5 g 12   vitamin B-12 (CYANOCOBALAMIN ) 1000 MCG tablet Take 1,000 mcg by mouth daily.     Zinc  50 MG CAPS Take 50 mg by mouth daily.     No current facility-administered medications for this visit.   No results found.  Review of Systems:   A ROS was performed including pertinent positives and negatives as documented in the HPI.  Physical Exam :   Constitutional: NAD and appears stated age Neurological: Alert and oriented Psych: Appropriate affect and cooperative There were no vitals taken for this visit.   Comprehensive Musculoskeletal Exam:    Tenderness to palpation over the right ischial tuberosity.  No tenderness in the lumbar midline, paraspinal muscles, or lateral hip.  Negative right straight leg raise.  Knee flexion strength is 5/5 and causes minimal discomfort.  Patient is ambulating well without assistive device.  Imaging:        Assessment & Plan Patient presents with 1 month history of pain around the right buttock with no history of injury.  On exam, pain does appear pinpointed to the ischial tuberosity, therefore suspecting involvement of proximal hamstring tendinopathy versus ischial bursitis.  No significant strength deficits with knee flexion or symptoms consistent with sciatica.  Discussed that these pathologies are managed conservatively and therefore provided home exercise and stretching program for the hamstrings and hips.  Patient has had adverse effects to meloxicam  in the past, so we discussed alternatives and ultimately  decided to proceed with a Medrol  Dosepak for symptom relief.  Can alternate ice and heat therapy.  Discussed potential referral to formal physical therapy if symptoms persist in another 3 to 4 weeks.       I personally saw and evaluated the patient, and participated in the management and treatment plan.  Leonce Reveal, PA-C Orthopedics

## 2024-10-19 ENCOUNTER — Encounter (HOSPITAL_BASED_OUTPATIENT_CLINIC_OR_DEPARTMENT_OTHER): Payer: Self-pay

## 2024-10-19 ENCOUNTER — Other Ambulatory Visit: Payer: Self-pay | Admitting: Orthopaedic Surgery

## 2024-10-19 MED ORDER — METHOCARBAMOL 500 MG PO TABS: 500.0000 mg | ORAL_TABLET | Freq: Four times a day (QID) | ORAL | 2 refills | Status: AC

## 2024-10-22 ENCOUNTER — Other Ambulatory Visit: Payer: Self-pay | Admitting: Family Medicine
